# Patient Record
Sex: Female | Born: 1950 | Race: White | Hispanic: No | Marital: Married | Smoking: Never smoker
Health system: Southern US, Community
[De-identification: ages and names within clinical notes are randomized; demographics above are authoritative.]

## PROBLEM LIST (undated history)

## (undated) DIAGNOSIS — I509 Heart failure, unspecified: Secondary | ICD-10-CM

## (undated) DIAGNOSIS — E119 Type 2 diabetes mellitus without complications: Secondary | ICD-10-CM

## (undated) DIAGNOSIS — I429 Cardiomyopathy, unspecified: Secondary | ICD-10-CM

## (undated) DIAGNOSIS — I10 Essential (primary) hypertension: Secondary | ICD-10-CM

## (undated) DIAGNOSIS — M8580 Other specified disorders of bone density and structure, unspecified site: Secondary | ICD-10-CM

## (undated) DIAGNOSIS — L409 Psoriasis, unspecified: Secondary | ICD-10-CM

## (undated) DIAGNOSIS — M2550 Pain in unspecified joint: Secondary | ICD-10-CM

## (undated) DIAGNOSIS — Z889 Allergy status to unspecified drugs, medicaments and biological substances status: Secondary | ICD-10-CM

## (undated) DIAGNOSIS — Z9889 Other specified postprocedural states: Secondary | ICD-10-CM

## (undated) DIAGNOSIS — Z950 Presence of cardiac pacemaker: Secondary | ICD-10-CM

## (undated) DIAGNOSIS — I499 Cardiac arrhythmia, unspecified: Secondary | ICD-10-CM

## (undated) DIAGNOSIS — I4891 Unspecified atrial fibrillation: Secondary | ICD-10-CM

## (undated) DIAGNOSIS — M109 Gout, unspecified: Secondary | ICD-10-CM

## (undated) HISTORY — PX: BREAST EXCISIONAL BIOPSY: SUR124

## (undated) HISTORY — DX: Pain in unspecified joint: M25.50

## (undated) HISTORY — DX: Type 2 diabetes mellitus without complications: E11.9

## (undated) HISTORY — DX: Heart failure, unspecified: I50.9

## (undated) HISTORY — DX: Essential (primary) hypertension: I10

## (undated) HISTORY — DX: Gout, unspecified: M10.9

## (undated) HISTORY — DX: Other specified disorders of bone density and structure, unspecified site: M85.80

## (undated) HISTORY — DX: Unspecified atrial fibrillation: I48.91

## (undated) SURGICAL SUPPLY — 7 items
BIOPATCH RED 1 DISK 7.0 (GAUZE/BANDAGES/DRESSINGS) IMPLANT
CATH HEMO 15FR 19 SMART SEAL (HEMODIALYSIS SUPPLIES) IMPLANT
DRAPE INCISE IOBAN 66X45 STRL (DRAPES) IMPLANT
GOWN STRL REUS W/ TWL LRG LVL3 (GOWN DISPOSABLE) ×1 IMPLANT
PACK ANGIOGRAPHY (CUSTOM PROCEDURE TRAY) ×1 IMPLANT
SUT MNCRL AB 4-0 PS2 18 (SUTURE) IMPLANT
SUT SILK 0 FSL (SUTURE) IMPLANT

---

## 1979-03-25 HISTORY — PX: TUBAL LIGATION: SHX77

## 1983-03-25 HISTORY — PX: ABDOMINAL HYSTERECTOMY: SHX81

## 1984-03-24 HISTORY — PX: CHOLECYSTECTOMY: SHX55

## 1995-03-25 HISTORY — PX: BREAST SURGERY: SHX581

## 1998-03-14 ENCOUNTER — Ambulatory Visit (HOSPITAL_BASED_OUTPATIENT_CLINIC_OR_DEPARTMENT_OTHER): Admission: RE | Admit: 1998-03-14 | Discharge: 1998-03-14 | Payer: Self-pay | Admitting: General Surgery

## 2004-02-29 ENCOUNTER — Ambulatory Visit: Payer: Self-pay | Admitting: Internal Medicine

## 2005-04-04 ENCOUNTER — Ambulatory Visit: Payer: Self-pay | Admitting: Internal Medicine

## 2006-06-03 ENCOUNTER — Ambulatory Visit: Payer: Self-pay | Admitting: Internal Medicine

## 2007-06-30 ENCOUNTER — Ambulatory Visit: Payer: Self-pay | Admitting: Internal Medicine

## 2008-03-24 DIAGNOSIS — I4891 Unspecified atrial fibrillation: Secondary | ICD-10-CM

## 2008-03-24 HISTORY — DX: Unspecified atrial fibrillation: I48.91

## 2008-05-19 ENCOUNTER — Inpatient Hospital Stay: Payer: Self-pay | Admitting: Internal Medicine

## 2008-06-21 ENCOUNTER — Ambulatory Visit: Payer: Self-pay | Admitting: Cardiology

## 2008-08-25 ENCOUNTER — Ambulatory Visit: Payer: Self-pay | Admitting: Internal Medicine

## 2009-08-27 ENCOUNTER — Ambulatory Visit: Payer: Self-pay | Admitting: Internal Medicine

## 2010-12-24 ENCOUNTER — Ambulatory Visit: Payer: Self-pay | Admitting: Internal Medicine

## 2012-04-01 ENCOUNTER — Ambulatory Visit: Payer: Self-pay | Admitting: Internal Medicine

## 2013-09-22 ENCOUNTER — Ambulatory Visit: Payer: Self-pay | Admitting: Internal Medicine

## 2014-06-21 ENCOUNTER — Ambulatory Visit (INDEPENDENT_AMBULATORY_CARE_PROVIDER_SITE_OTHER): Payer: Medicare Other | Admitting: General Surgery

## 2014-06-21 ENCOUNTER — Encounter: Payer: Self-pay | Admitting: General Surgery

## 2014-06-21 VITALS — BP 156/96 | HR 96 | Resp 16 | Ht 62.0 in | Wt 183.0 lb

## 2014-06-21 DIAGNOSIS — R0789 Other chest pain: Secondary | ICD-10-CM

## 2014-06-21 DIAGNOSIS — Z1211 Encounter for screening for malignant neoplasm of colon: Secondary | ICD-10-CM | POA: Diagnosis not present

## 2014-06-21 MED ORDER — POLYETHYLENE GLYCOL 3350 17 GM/SCOOP PO POWD: ORAL | Status: DC

## 2014-06-21 NOTE — Progress Notes (Signed)
Patient ID: Deborah Dawson, female   DOB: [birth year], 64 y.o.   MRN: [id number]  Chief Complaint  Patient presents with  . Breast Problem    right breast pain    HPI Deborah Dawson is a 64 y.o. female.  who presents for a breast evaluation. The most recent mammogram was done on September 22 2013.  Patient does perform regular self breast checks and gets regular mammograms done.   Denies any trauma. She states she has had some "discomfort" in the right axillary area top part of the right breast. She states it has been constant for about 3-4 weeks. Family history of mom with breast cancer was around age 75. She has never had a colonoscopy and denies family history of colon cancer.  Bra size DD.  HPI  Past Medical History  Diagnosis Date  . Atrial fibrillation 2010  . Joint pain     Past Surgical History  Procedure Laterality Date  . Abdominal hysterectomy  1985  . Tubal ligation  1981  . Breast surgery Left 1997    lipoma  . Cholecystectomy  1986    Family History  Problem Relation Age of Onset  . Cancer Mother 92    breast  . Diabetes Mother     Social History History  Substance Use Topics  . Smoking status: Never Smoker   . Smokeless tobacco: Never Used  . Alcohol Use: No    Allergies  Allergen Reactions  . Augmentin [Amoxicillin-Pot Clavulanate] Diarrhea  . Red Dye Rash    Current Outpatient Prescriptions  Medication Sig Dispense Refill  . cloNIDine (CATAPRES) 0.2 MG tablet Take 0.2 mg by mouth 3 (three) times daily.    Marland Kitchen diltiazem (CARDIZEM CD) 180 MG 24 hr capsule Take 180 mg by mouth daily.    Marland Kitchen glimepiride (AMARYL) 2 MG tablet Take 2 mg by mouth at bedtime.    Marland Kitchen glimepiride (AMARYL) 4 MG tablet Take 4 mg by mouth daily with breakfast.    . indapamide (LOZOL) 1.25 MG tablet Take 1.25 mg by mouth daily.   3  . loratadine (CLARITIN) 10 MG tablet Take 10 mg by mouth daily as needed for allergies.    . metFORMIN (GLUCOPHAGE) 1000 MG tablet Take 1,000 mg by mouth 2  (two) times daily with a meal.   0  . warfarin (COUMADIN) 3 MG tablet Take 3 mg by mouth daily. Sat sun    . polyethylene glycol powder (GLYCOLAX/MIRALAX) powder 255 grams one bottle for colonoscopy prep 255 g 0  . warfarin (COUMADIN) 2.5 MG tablet Take 2.5 mg by mouth. mon through  fri  0   No current facility-administered medications for this visit.    Review of Systems Review of Systems  Blood pressure 156/96, pulse 96, resp. rate 16, height 5\' 2"  (1.575 m), weight 183 lb (83.008 kg).  Physical Exam Physical Exam  Constitutional: She is oriented to person, place, and time. She appears well-developed and well-nourished.  Neck: Neck supple.  Cardiovascular: Normal rate and normal heart sounds.  An irregular rhythm present.  Pulmonary/Chest: Effort normal and breath sounds normal. Right breast exhibits tenderness. Right breast exhibits no inverted nipple, no mass, no nipple discharge and no skin change. Left breast exhibits tenderness. Left breast exhibits no inverted nipple, no mass, no nipple discharge and no skin change.    tender around serratus right breast. Tenderness along inframammary fold left breast.  Lymphadenopathy:    She has no cervical adenopathy.  She has no axillary adenopathy.  Neurological: She is alert and oriented to person, place, and time.  Skin: Skin is warm and dry.    Data Reviewed Bilateral mammograms dated 09/22/2013 were reviewed. Near-complete fatty replacement of the breast. BI-RADS-1.  GYN notes from Kate Sable, M.D. dated 06/13/2014 reviewed.  Assessment    Benign breast exam.    Plan    Chest wall muscle pain, no evidence of breast pathology.  Candidate for screening colonoscopy.     May use heating pad as needed for comfort. May use Aleve twice a day for 5 days.  Colonoscopy with possible biopsy/polypectomy prn: Information regarding the procedure, including its potential risks and complications (including but not limited to  perforation of the bowel, which may require emergency surgery to repair, and bleeding) was verbally given to the patient. Educational information regarding lower instestinal endoscopy was given to the patient. Written instructions for how to complete the bowel prep using Miralax were provided. The importance of drinking ample fluids to avoid dehydration as a result of the prep emphasized.  Patient has been scheduled for a colonoscopy on 08-23-14 at [facility]. This patient has been asked to stop coumadin 4 days prior to procedure. She will also hold Metformin day of colonoscopy prep and procedure. Risks associated with discontinuation of her oral anticoagulation therapy in regards to CVA was reviewed. This is well less than 1%.     PCP:  Barbette Reichmann Ref: Dr Janann Colonel, Merrily Pew 06/23/2014, 12:13 PM

## 2014-06-21 NOTE — Patient Instructions (Addendum)
The patient is aware to call back for any questions or concerns. May use heating pad as needed for comfort.  May use Aleve twice a day for 5 days.  Colonoscopy A colonoscopy is an exam to look at the entire large intestine (colon). This exam can help find problems such as tumors, polyps, inflammation, and areas of bleeding. The exam takes about 1 hour.  LET [facility] CARE PROVIDER KNOW ABOUT:   Any allergies you have.  All medicines you are taking, including vitamins, herbs, eye drops, creams, and over-the-counter medicines.  Previous problems you or members of your family have had with the use of anesthetics.  Any blood disorders you have.  Previous surgeries you have had.  Medical conditions you have. RISKS AND COMPLICATIONS  Generally, this is a safe procedure. However, as with any procedure, complications can occur. Possible complications include:  Bleeding.  Tearing or rupture of the colon wall.  Reaction to medicines given during the exam.  Infection (rare). BEFORE THE PROCEDURE   Ask your health care provider about changing or stopping your regular medicines.  You may be prescribed an oral bowel prep. This involves drinking a large amount of medicated liquid, starting the day before your procedure. The liquid will cause you to have multiple loose stools until your stool is almost clear or light green. This cleans out your colon in preparation for the procedure.  Do not eat or drink anything else once you have started the bowel prep, unless your health care provider tells you it is safe to do so.  Arrange for someone to drive you home after the procedure. PROCEDURE   You will be given medicine to help you relax (sedative).  You will lie on your side with your knees bent.  A long, flexible tube with a light and camera on the end (colonoscope) will be inserted through the rectum and into the colon. The camera sends video back to a computer screen as it moves through  the colon. The colonoscope also releases carbon dioxide gas to inflate the colon. This helps your health care provider see the area better.  During the exam, your health care provider may take a small tissue sample (biopsy) to be examined under a microscope if any abnormalities are found.  The exam is finished when the entire colon has been viewed. AFTER THE PROCEDURE   Do not drive for 24 hours after the exam.  You may have a small amount of blood in your stool.  You may pass moderate amounts of gas and have mild abdominal cramping or bloating. This is caused by the gas used to inflate your colon during the exam.  Ask when your test results will be ready and how you will get your results. Make sure you get your test results. Document Released: 03/07/2000 Document Revised: 12/29/2012 Document Reviewed: 11/15/2012 [facility] Patient Information 2015 [place], [place]. This information is not intended to replace advice given to you by your health care provider. Make sure you discuss any questions you have with your health care provider.  Patient has been scheduled for a colonoscopy on 08-23-14 at [facility]. This patient has been asked to stop coumadin 4 days prior to procedure. She will also hold Metformin day of colonoscopy prep and procedure.

## 2014-06-23 ENCOUNTER — Other Ambulatory Visit: Payer: Self-pay | Admitting: General Surgery

## 2014-06-23 DIAGNOSIS — Z4901 Encounter for fitting and adjustment of extracorporeal dialysis catheter: Secondary | ICD-10-CM | POA: Insufficient documentation

## 2014-06-23 DIAGNOSIS — Z1211 Encounter for screening for malignant neoplasm of colon: Secondary | ICD-10-CM | POA: Insufficient documentation

## 2014-06-23 DIAGNOSIS — R0789 Other chest pain: Secondary | ICD-10-CM | POA: Insufficient documentation

## 2014-08-15 ENCOUNTER — Telehealth: Payer: Self-pay

## 2014-08-15 NOTE — Telephone Encounter (Signed)
Patient called and states that she does not want to have her Colonoscopy done at this time and did not want to reschedule it. I let her know that I would notify Dr Lemar Livings of her decision.

## 2014-08-23 ENCOUNTER — Ambulatory Visit: Admission: RE | Admit: 2014-08-23 | Payer: Self-pay | Source: Ambulatory Visit | Admitting: General Surgery

## 2014-08-23 ENCOUNTER — Encounter: Admission: RE | Payer: Self-pay | Source: Ambulatory Visit

## 2014-08-23 SURGERY — COLONOSCOPY
Anesthesia: General

## 2014-10-02 ENCOUNTER — Other Ambulatory Visit: Payer: Self-pay | Admitting: Internal Medicine

## 2014-10-02 DIAGNOSIS — Z1231 Encounter for screening mammogram for malignant neoplasm of breast: Secondary | ICD-10-CM

## 2014-10-03 ENCOUNTER — Ambulatory Visit
Admission: RE | Admit: 2014-10-03 | Discharge: 2014-10-03 | Disposition: A | Discharge status: Home / Self Care | Payer: Medicare Other | Source: Ambulatory Visit | Attending: Internal Medicine | Admitting: Internal Medicine

## 2014-10-03 ENCOUNTER — Other Ambulatory Visit: Payer: Self-pay | Admitting: Internal Medicine

## 2014-10-03 DIAGNOSIS — Z1231 Encounter for screening mammogram for malignant neoplasm of breast: Secondary | ICD-10-CM

## 2015-06-24 ENCOUNTER — Inpatient Hospital Stay
Admission: EM | Admit: 2015-06-24 | Discharge: 2015-06-26 | DRG: 291 | Disposition: A | Discharge status: Home / Self Care | Payer: Medicare Other | Attending: Internal Medicine | Admitting: Internal Medicine

## 2015-06-24 ENCOUNTER — Emergency Department: Payer: Medicare Other

## 2015-06-24 ENCOUNTER — Encounter: Payer: Self-pay | Admitting: Emergency Medicine

## 2015-06-24 DIAGNOSIS — I447 Left bundle-branch block, unspecified: Secondary | ICD-10-CM | POA: Diagnosis present

## 2015-06-24 DIAGNOSIS — J810 Acute pulmonary edema: Secondary | ICD-10-CM

## 2015-06-24 DIAGNOSIS — I428 Other cardiomyopathies: Secondary | ICD-10-CM | POA: Diagnosis present

## 2015-06-24 DIAGNOSIS — G8929 Other chronic pain: Secondary | ICD-10-CM | POA: Diagnosis present

## 2015-06-24 DIAGNOSIS — I110 Hypertensive heart disease with heart failure: Principal | ICD-10-CM | POA: Diagnosis present

## 2015-06-24 DIAGNOSIS — D72829 Elevated white blood cell count, unspecified: Secondary | ICD-10-CM | POA: Diagnosis present

## 2015-06-24 DIAGNOSIS — I501 Left ventricular failure: Secondary | ICD-10-CM | POA: Diagnosis present

## 2015-06-24 DIAGNOSIS — Z7984 Long term (current) use of oral hypoglycemic drugs: Secondary | ICD-10-CM | POA: Diagnosis not present

## 2015-06-24 DIAGNOSIS — J449 Chronic obstructive pulmonary disease, unspecified: Secondary | ICD-10-CM | POA: Diagnosis present

## 2015-06-24 DIAGNOSIS — E119 Type 2 diabetes mellitus without complications: Secondary | ICD-10-CM | POA: Diagnosis present

## 2015-06-24 DIAGNOSIS — R0603 Acute respiratory distress: Secondary | ICD-10-CM

## 2015-06-24 DIAGNOSIS — Z7901 Long term (current) use of anticoagulants: Secondary | ICD-10-CM

## 2015-06-24 DIAGNOSIS — M549 Dorsalgia, unspecified: Secondary | ICD-10-CM | POA: Diagnosis present

## 2015-06-24 DIAGNOSIS — J9601 Acute respiratory failure with hypoxia: Secondary | ICD-10-CM

## 2015-06-24 DIAGNOSIS — I4891 Unspecified atrial fibrillation: Secondary | ICD-10-CM

## 2015-06-24 DIAGNOSIS — I248 Other forms of acute ischemic heart disease: Secondary | ICD-10-CM | POA: Diagnosis present

## 2015-06-24 DIAGNOSIS — I5021 Acute systolic (congestive) heart failure: Secondary | ICD-10-CM | POA: Diagnosis present

## 2015-06-24 DIAGNOSIS — R0600 Dyspnea, unspecified: Secondary | ICD-10-CM | POA: Diagnosis present

## 2015-06-24 DIAGNOSIS — Z833 Family history of diabetes mellitus: Secondary | ICD-10-CM | POA: Diagnosis not present

## 2015-06-24 DIAGNOSIS — R0902 Hypoxemia: Secondary | ICD-10-CM

## 2015-06-24 DIAGNOSIS — I10 Essential (primary) hypertension: Secondary | ICD-10-CM

## 2015-06-24 DIAGNOSIS — Z803 Family history of malignant neoplasm of breast: Secondary | ICD-10-CM | POA: Diagnosis not present

## 2015-06-24 DIAGNOSIS — I482 Chronic atrial fibrillation: Secondary | ICD-10-CM | POA: Diagnosis present

## 2015-06-24 DIAGNOSIS — I509 Heart failure, unspecified: Secondary | ICD-10-CM

## 2015-06-24 DIAGNOSIS — R000 Tachycardia, unspecified: Secondary | ICD-10-CM | POA: Diagnosis present

## 2015-06-24 DIAGNOSIS — R079 Chest pain, unspecified: Secondary | ICD-10-CM

## 2015-06-24 LAB — COMPREHENSIVE METABOLIC PANEL
ALT: 16 U/L (ref 14–54)
AST: 26 U/L (ref 15–41)
Albumin: 4.2 g/dL (ref 3.5–5.0)
Alkaline Phosphatase: 50 U/L (ref 38–126)
Anion gap: 12 (ref 5–15)
BUN: 13 mg/dL (ref 6–20)
CO2: 19 mmol/L — ABNORMAL LOW (ref 22–32)
Calcium: 9.4 mg/dL (ref 8.9–10.3)
Chloride: 105 mmol/L (ref 101–111)
Creatinine, Ser: 0.74 mg/dL (ref 0.44–1.00)
GFR calc Af Amer: >60 mL/min (ref >60)
GFR calc non Af Amer: >60 mL/min (ref >60)
Glucose, Bld: 313 mg/dL — ABNORMAL HIGH (ref 65–99)
Potassium: 4.1 mmol/L (ref 3.5–5.1)
Sodium: 136 mmol/L (ref 135–145)
Total Bilirubin: 0.7 mg/dL (ref 0.3–1.2)
Total Protein: 8.1 g/dL (ref 6.5–8.1)

## 2015-06-24 LAB — CBC WITH DIFFERENTIAL/PLATELET
Basophils Absolute: 0.1 10*3/uL (ref 0–0.1)
Basophils Relative: 1 %
Eosinophils Absolute: 0.3 10*3/uL (ref 0–0.7)
Eosinophils Relative: 2 %
HCT: 42.6 % (ref 35.0–47.0)
Hemoglobin: 13.8 g/dL (ref 12.0–16.0)
Lymphocytes Relative: 29 %
Lymphs Abs: 3.9 10*3/uL — ABNORMAL HIGH (ref 1.0–3.6)
MCH: 27.5 pg (ref 26.0–34.0)
MCHC: 32.3 g/dL (ref 32.0–36.0)
MCV: 85 fL (ref 80.0–100.0)
Monocytes Absolute: 1 10*3/uL — ABNORMAL HIGH (ref 0.2–0.9)
Monocytes Relative: 8 %
Neutro Abs: 8.2 10*3/uL — ABNORMAL HIGH (ref 1.4–6.5)
Neutrophils Relative %: 60 %
Platelets: 363 10*3/uL (ref 150–440)
RBC: 5.01 MIL/uL (ref 3.80–5.20)
RDW: 15 % — ABNORMAL HIGH (ref 11.5–14.5)
WBC: 13.5 10*3/uL — ABNORMAL HIGH (ref 3.6–11.0)

## 2015-06-24 LAB — TSH: TSH: 0.65 u[IU]/mL (ref 0.350–4.500)

## 2015-06-24 LAB — TROPONIN I
Troponin I: <0.03 ng/mL (ref <0.031)
Troponin I: 0.38 ng/mL — ABNORMAL HIGH (ref <0.031)
Troponin I: 0.48 ng/mL — ABNORMAL HIGH (ref <0.031)

## 2015-06-24 LAB — PROTIME-INR
INR: 2.15
Prothrombin Time: 23.8 seconds — ABNORMAL HIGH (ref 11.4–15.0)

## 2015-06-24 LAB — BRAIN NATRIURETIC PEPTIDE: B Natriuretic Peptide: 922 pg/mL — ABNORMAL HIGH (ref 0.0–100.0)

## 2015-06-24 LAB — MAGNESIUM: Magnesium: 1.4 mg/dL — ABNORMAL LOW (ref 1.7–2.4)

## 2015-06-24 MED ORDER — ONDANSETRON HCL 4 MG PO TABS: 4.0000 mg | ORAL_TABLET | Freq: Four times a day (QID) | ORAL | Status: DC | PRN

## 2015-06-24 MED ORDER — ASPIRIN EC 81 MG PO TBEC
81.0000 mg | DELAYED_RELEASE_TABLET | Freq: Every day | ORAL | Status: DC
  Administered 2015-06-24 – 2015-06-26 (×3): 81 mg via ORAL
  Filled 2015-06-24 (×3): qty 1

## 2015-06-24 MED ORDER — METFORMIN HCL 500 MG PO TABS
1000.0000 mg | ORAL_TABLET | Freq: Two times a day (BID) | ORAL | Status: DC
  Administered 2015-06-24 – 2015-06-26 (×4): 1000 mg via ORAL
  Filled 2015-06-24 (×4): qty 2

## 2015-06-24 MED ORDER — INSULIN ASPART 100 UNIT/ML ~~LOC~~ SOLN: 0.0000 [IU] | Freq: Every day | SUBCUTANEOUS | Status: DC

## 2015-06-24 MED ORDER — FUROSEMIDE 10 MG/ML IJ SOLN
20.0000 mg | Freq: Two times a day (BID) | INTRAMUSCULAR | Status: DC
  Administered 2015-06-24 – 2015-06-26 (×4): 20 mg via INTRAVENOUS
  Filled 2015-06-24 (×4): qty 2

## 2015-06-24 MED ORDER — DILTIAZEM HCL 25 MG/5ML IV SOLN
10.0000 mg | Freq: Once | INTRAVENOUS | Status: AC
  Administered 2015-06-24: 10 mg via INTRAVENOUS

## 2015-06-24 MED ORDER — ACETAMINOPHEN 650 MG RE SUPP: 650.0000 mg | Freq: Four times a day (QID) | RECTAL | Status: DC | PRN

## 2015-06-24 MED ORDER — INSULIN ASPART 100 UNIT/ML ~~LOC~~ SOLN
4.0000 [IU] | Freq: Three times a day (TID) | SUBCUTANEOUS | Status: DC
  Administered 2015-06-25 – 2015-06-26 (×4): 4 [IU] via SUBCUTANEOUS
  Filled 2015-06-24 (×5): qty 4

## 2015-06-24 MED ORDER — CLONIDINE HCL 0.1 MG PO TABS
0.2000 mg | ORAL_TABLET | Freq: Three times a day (TID) | ORAL | Status: DC
  Administered 2015-06-24 – 2015-06-26 (×5): 0.2 mg via ORAL
  Filled 2015-06-24 (×5): qty 2

## 2015-06-24 MED ORDER — SODIUM CHLORIDE 0.9% FLUSH
3.0000 mL | Freq: Two times a day (BID) | INTRAVENOUS | Status: DC
  Administered 2015-06-24 – 2015-06-26 (×4): 3 mL via INTRAVENOUS

## 2015-06-24 MED ORDER — NITROGLYCERIN 0.4 MG SL SUBL
SUBLINGUAL_TABLET | SUBLINGUAL | Status: AC
  Administered 2015-06-24: 0.4 mg via SUBLINGUAL
  Filled 2015-06-24: qty 1

## 2015-06-24 MED ORDER — NITROGLYCERIN 2 % TD OINT
1.0000 [in_us] | TOPICAL_OINTMENT | Freq: Four times a day (QID) | TRANSDERMAL | Status: DC
  Administered 2015-06-25 (×2): 1 [in_us] via TOPICAL
  Filled 2015-06-24 (×2): qty 1

## 2015-06-24 MED ORDER — GLIMEPIRIDE 2 MG PO TABS
4.0000 mg | ORAL_TABLET | ORAL | Status: DC
  Administered 2015-06-25 – 2015-06-26 (×2): 4 mg via ORAL
  Filled 2015-06-24 (×3): qty 2

## 2015-06-24 MED ORDER — DILTIAZEM HCL 25 MG/5ML IV SOLN
15.0000 mg | Freq: Once | INTRAVENOUS | Status: AC
  Administered 2015-06-24: 15 mg via INTRAVENOUS

## 2015-06-24 MED ORDER — DILTIAZEM HCL 100 MG IV SOLR
5.0000 mg/h | Freq: Once | INTRAVENOUS | Status: AC
  Administered 2015-06-24: 10 mg/h via INTRAVENOUS
  Filled 2015-06-24 (×2): qty 100

## 2015-06-24 MED ORDER — MAGNESIUM OXIDE 400 (241.3 MG) MG PO TABS
400.0000 mg | ORAL_TABLET | Freq: Two times a day (BID) | ORAL | Status: AC
  Administered 2015-06-24 – 2015-06-25 (×2): 400 mg via ORAL
  Filled 2015-06-24 (×2): qty 1

## 2015-06-24 MED ORDER — OXYCODONE HCL 5 MG PO TABS: 5.0000 mg | ORAL_TABLET | ORAL | Status: DC | PRN

## 2015-06-24 MED ORDER — DILTIAZEM HCL 100 MG IV SOLR
5.0000 mg/h | INTRAVENOUS | Status: DC
  Administered 2015-06-24: 15 mg/h via INTRAVENOUS

## 2015-06-24 MED ORDER — NITROGLYCERIN 0.4 MG SL SUBL
0.4000 mg | SUBLINGUAL_TABLET | SUBLINGUAL | Status: DC | PRN
  Administered 2015-06-24: 0.4 mg via SUBLINGUAL

## 2015-06-24 MED ORDER — ONDANSETRON HCL 4 MG/2ML IJ SOLN: 4.0000 mg | Freq: Four times a day (QID) | INTRAMUSCULAR | Status: DC | PRN

## 2015-06-24 MED ORDER — DILTIAZEM HCL ER COATED BEADS 180 MG PO CP24
180.0000 mg | ORAL_CAPSULE | Freq: Two times a day (BID) | ORAL | Status: DC
  Administered 2015-06-24 – 2015-06-25 (×2): 180 mg via ORAL
  Filled 2015-06-24 (×2): qty 1

## 2015-06-24 MED ORDER — FUROSEMIDE 10 MG/ML IJ SOLN
20.0000 mg | Freq: Once | INTRAMUSCULAR | Status: AC
  Administered 2015-06-24: 20 mg via INTRAVENOUS
  Filled 2015-06-24: qty 4

## 2015-06-24 MED ORDER — DILTIAZEM HCL 25 MG/5ML IV SOLN
INTRAVENOUS | Status: AC
  Administered 2015-06-24: 25 mg
  Filled 2015-06-24: qty 5

## 2015-06-24 MED ORDER — WARFARIN SODIUM 2.5 MG PO TABS
2.5000 mg | ORAL_TABLET | ORAL | Status: DC
  Administered 2015-06-25: 2.5 mg via ORAL
  Filled 2015-06-24 (×2): qty 1

## 2015-06-24 MED ORDER — INSULIN ASPART 100 UNIT/ML ~~LOC~~ SOLN
0.0000 [IU] | Freq: Three times a day (TID) | SUBCUTANEOUS | Status: DC
  Administered 2015-06-25 – 2015-06-26 (×3): 2 [IU] via SUBCUTANEOUS
  Filled 2015-06-24: qty 2
  Filled 2015-06-24: qty 3
  Filled 2015-06-24 (×2): qty 2

## 2015-06-24 MED ORDER — MAGNESIUM SULFATE 2 GM/50ML IV SOLN
2.0000 g | Freq: Once | INTRAVENOUS | Status: AC
  Administered 2015-06-24: 2 g via INTRAVENOUS
  Filled 2015-06-24: qty 50

## 2015-06-24 MED ORDER — GLIMEPIRIDE 2 MG PO TABS
2.0000 mg | ORAL_TABLET | Freq: Every evening | ORAL | Status: DC
  Administered 2015-06-24 – 2015-06-25 (×2): 2 mg via ORAL
  Filled 2015-06-24 (×2): qty 1

## 2015-06-24 MED ORDER — WARFARIN SODIUM 3 MG PO TABS
3.0000 mg | ORAL_TABLET | ORAL | Status: DC
  Administered 2015-06-24: 3 mg via ORAL
  Filled 2015-06-24: qty 1

## 2015-06-24 MED ORDER — ACETAMINOPHEN 325 MG PO TABS: 650.0000 mg | ORAL_TABLET | Freq: Four times a day (QID) | ORAL | Status: DC | PRN

## 2015-06-24 MED ORDER — SODIUM CHLORIDE 0.9% FLUSH: 3.0000 mL | INTRAVENOUS | Status: DC | PRN

## 2015-06-24 MED ORDER — WARFARIN SODIUM 2 MG PO TABS: 3.0000 mg | ORAL_TABLET | Freq: Every evening | ORAL | Status: DC

## 2015-06-24 MED ORDER — IPRATROPIUM-ALBUTEROL 0.5-2.5 (3) MG/3ML IN SOLN
RESPIRATORY_TRACT | Status: AC
  Filled 2015-06-24: qty 6

## 2015-06-24 MED ORDER — WARFARIN SODIUM 5 MG PO TABS: 2.5000 mg | ORAL_TABLET | Freq: Every evening | ORAL | Status: DC

## 2015-06-24 MED ORDER — SODIUM CHLORIDE 0.9 % IV SOLN: 250.0000 mL | INTRAVENOUS | Status: DC | PRN

## 2015-06-24 NOTE — Progress Notes (Addendum)
Paged Dr. Winona Legato about cardizem order since patient came up from the ER with it currently infusing. MD stated she thought it was stopped int he emergency room. Patient's heart rate has been from the 80's to high 90's in Afib since arriving to the floor with cardizem going at 15. MD stated to titrate drip down, starting at 10 now. MD stated to try and get the patient off the drip tonight so she can take her PO dose of 180 cardizem tonight at 2000. Will titrate down to 10 and continue to monitor.

## 2015-06-24 NOTE — H&P (Signed)
Spoke with Dr. Winona Legato about patient current HR and BP. Dr. Denzil Magnuson stated to proceed with the diltiazem 180 mg, and D/C the cardizem drip in a hr. Will continue to monitor.

## 2015-06-24 NOTE — ED Notes (Signed)
Pt taken off BiPAP and placed on 3L. Tolerating well.

## 2015-06-24 NOTE — Progress Notes (Signed)
Patient's cardizem drip D/C per Dr. Winona Legato.

## 2015-06-24 NOTE — Progress Notes (Signed)
Notified prime doc, Sparks, that patient's second troponin came back at 0.38. First one was negative. Patient does not currently have a cardiology consult, MD will order. Patient's magnesium also came back low at 1.4. MD to place orders. Will continue to monitor.

## 2015-06-24 NOTE — ED Provider Notes (Signed)
[facility] Emergency Department Provider Note   ____________________________________________  Time seen: Immediately upon ED arrival I have reviewed the triage vital signs and the triage nursing note.  HISTORY  Chief Complaint Respiratory Distress   Historian Limited, patient in severe respiratory distress Significant other provide some history, but relatively poor historian  HPI Deborah Dawson is a 65 y.o. female arrived in respiratory distress and brought immediately back to a room. Patient was reportedly hypoxic into the mid 80s on room air and placed on 4 L nasal cannula up to the mid 90s.  Patient having trouble speaking. She initially stated that she has not had a heart attack, congestive heart failure, or any lung problems such as COPD or emphysema.  Unclear whether or not she has been having respiratory symptoms recently. Shakes her head no when asked about fevers.  Once hooked up to the monitor, monitor shows wide complex irregular and tachycardic rhythm. Patient reports that she takes Cardizem at home for atrial fibrillation.    Past Medical History  Diagnosis Date  . Atrial fibrillation (HCC) 2010  . Joint pain     Patient Active Problem List   Diagnosis Date Noted  . Encounter for screening colonoscopy 06/23/2014  . Chest wall pain 06/23/2014    Past Surgical History  Procedure Laterality Date  . Abdominal hysterectomy  1985  . Tubal ligation  1981  . Breast surgery Left 1997    lipoma  . Cholecystectomy  1986  . Breast excisional biopsy Left     negative years ago    Current Outpatient Rx  Name  Route  Sig  Dispense  Refill  . cloNIDine (CATAPRES) 0.2 MG tablet   Oral   Take 0.2 mg by mouth 3 (three) times daily.         Marland Kitchen diltiazem (CARDIZEM CD) 180 MG 24 hr capsule   Oral   Take 180 mg by mouth daily.         Marland Kitchen glimepiride (AMARYL) 2 MG tablet   Oral   Take 2 mg by mouth at bedtime.         Marland Kitchen glimepiride  (AMARYL) 4 MG tablet   Oral   Take 4 mg by mouth daily with breakfast.         . indapamide (LOZOL) 1.25 MG tablet   Oral   Take 1.25 mg by mouth daily.       3   . loratadine (CLARITIN) 10 MG tablet   Oral   Take 10 mg by mouth daily as needed for allergies.         . metFORMIN (GLUCOPHAGE) 1000 MG tablet   Oral   Take 1,000 mg by mouth 2 (two) times daily with a meal.       0   . polyethylene glycol powder (GLYCOLAX/MIRALAX) powder      255 grams one bottle for colonoscopy prep   255 g   0   . warfarin (COUMADIN) 2.5 MG tablet   Oral   Take 2.5 mg by mouth. mon through  fri      0   . warfarin (COUMADIN) 3 MG tablet   Oral   Take 3 mg by mouth daily. Sat sun           Allergies Augmentin and Red dye  Family History  Problem Relation Age of Onset  . Cancer Mother 98    breast  . Diabetes Mother   . Breast cancer Mother 89  Social History Social History  Substance Use Topics  . Smoking status: Never Smoker   . Smokeless tobacco: Never Used  . Alcohol Use: No    Review of Systems Limited due to respiratory distress Constitutional: Negative for fever. Eyes:  ENT:  Cardiovascular: Negative for chest pain. Respiratory: Positive for severe shortness of breath. Gastrointestinal: Genitourinary:  Musculoskeletal:  Skin:  Neurological:   ____________________________________________   PHYSICAL EXAM:  VITAL SIGNS: ED Triage Vitals  Enc Vitals Group     BP 06/24/15 1003 165/110 mmHg     Pulse Rate 06/24/15 1003 180     Resp 06/24/15 1003 40     Temp 06/24/15 1003 97.7 F (36.5 C)     Temp Source 06/24/15 1003 Oral     SpO2 06/24/15 1001 82 %     Weight 06/24/15 1018 187 lb (84.823 kg)     Height --      Head Cir --      Peak Flow --      Pain Score 06/24/15 1001 10     Pain Loc --      Pain Edu? --      Excl. in GC? --      Constitutional: Alert but anxious and in respiratory distress HEENT   Head: Normocephalic and  atraumatic.  Sweating      Eyes: Conjunctivae are normal. PERRL. Normal extraocular movements.      Ears:         Nose: No congestion/rhinnorhea.   Mouth/Throat: Mucous membranes are moist.   Neck: No stridor. Cardiovascular/Chest: Tachycardic and irregularly irregular.  No murmurs, rubs, or gallops. Respiratory: Tachypnea,Retractions. Can only speak about 1 word at a time. wet sounds throughout all fields, some wheezing, mild decreased breath sounds at bases. Gastrointestinal: Soft. No distention, no guarding, no rebound. Nontender.  Genitourinary/rectal:Deferred Musculoskeletal: Nontender with normal range of motion in all extremities. No joint effusions.  No lower extremity tenderness.  No edema. Neurologic:  Normal speech and language. No gross or focal neurologic deficits are appreciated. Skin:  Skin is warm, dry and intact. No rash noted. Psychiatric: Mood and affect are normal. Speech and behavior are normal. Patient exhibits appropriate insight and judgment.  ____________________________________________   EKG I, Governor Rooks, MD, the attending physician have personally viewed and interpreted all ECGs.  EKG #1. Heart rate 177 bpm. Irregularly irregular wide complex tachycardia/left bundle branch block. Nonspecific ST-T wave. Undetermined axis  EKG #2 heart rate 115 bpm. Irregularly irregular with a rhythm consistent with atrial fibrillation and apparent conduction/left bundle branch block. Normal axis. Nonspecific T-wave ____________________________________________  LABS (pertinent positives/negatives)  Metabolic panel significant for CO2 19 otherwise without significant abnormalities Troponin less than 0.03 White blood count 10.5, hemoglobin is 13.8 and platelet count 363 BNP 922 INR 2.15  ____________________________________________  RADIOLOGY All Xrays were viewed by me. Imaging interpreted by Radiologist.  Chest x-ray one view portable: Enlargement of the  cardiac silhouette. Diffuse pulmonary infiltrates and central peribronchial thickening favoring pulmonary edema. __________________________________________  PROCEDURES  Procedure(s) performed: None  Critical Care performed: CRITICAL CARE Performed by: Governor Rooks   Total critical care time: 60 minutes  Critical care time was exclusive of separately billable procedures and treating other patients.  Critical care was necessary to treat or prevent imminent or life-threatening deterioration.  Critical care was time spent personally by me on the following activities: development of treatment plan with patient and/or surrogate as well as nursing, discussions with consultants, evaluation of patient's response to treatment,  examination of patient, obtaining history from patient or surrogate, ordering and performing treatments and interventions, ordering and review of laboratory studies, ordering and review of radiographic studies, pulse oximetry and re-evaluation of patient's condition.   ____________________________________________   ED COURSE / ASSESSMENT AND PLAN  Pertinent labs & imaging results that were available during my care of the patient were reviewed by me and considered in my medical decision making (see chart for details).   Patient arrived in severe respiratory distress, and noted to be hypoxic. Clinical impression COPD exacerbation versus flash pulmonary edema. Patient reports no history of lung issues, and a history of atrial fibrillation, and so I felt CHF acute exacerbation with possible edema most likely.  Heart rate irregularly irregular and wide complex. Given the history of patient being on Cardizem and warfarin for prior A. fib, this is most likely to be A. fib with aberrancy.  Patient was placed on cardiac monitor as well as cardiac pads.  No prior EKG for comparison, and there is some consideration that potential new onset left and a branch block could be STEMI  equivalent. However patient is not having any chest pain.  I discussed this case with patient's cardiologist Dr. Darrold Junker, he reviewed the prior notes that his last office visit was 2 years ago, but the patient had a history of atrial fibrillation and on Cardizem and warfarin. He did recommend going ahead with Cardizem rather than something for wide complex tachycardia such as amiodarone.  50 mg Cardizem bolus IV was given and resultant heart rate came down to 90-110, and repeat EKG most consistent with atrial fibrillation with left bundle branch block.  Patient initially complaining of all respiratory symptoms, and having trouble tolerating nonrebreather. She is placed on nonrebreather for preoxygenation. I didn't want to try her on BiPAP given the fact that my impression is acute pulmonary edema.  After her heart rate was somewhat under control, she was able tolerate going on BiPAP. Given her blood pressure was still significantly elevated, patient was given nitroglycerin as well as Lasix for acute CHF exacerbation.  Heart rate did drifts back into the 120s to 130s and she was given a second IV Cardizem bolus of 10 mg. She was also ordered to start a Cardizem drip.   ----------------------------------------- 2:03 PM on 06/24/2015 -----------------------------------------  Chest x-ray and labs are consistent with pulmonary edema. Patient responded well to BiPAP Nitrostat and Lasix. She was able to be taken off of BiPAP around 2 PM and stable on 3 L nasal cannula. Blood pressure and heart rate much improved. At this point she does not need ICU admission, but I spoke with hospitalist for floor admission.    CONSULTATIONS:  Hospitalist physician for admission   Patient / Family / Caregiver informed of clinical course, medical decision-making process, and agree with plan.     ___________________________________________   FINAL CLINICAL IMPRESSION(S) / ED DIAGNOSES   Final diagnoses:   Flash pulmonary edema (HCC)  Atrial fibrillation with rapid ventricular response (HCC)  Hypoxia  Respiratory distress              Note: This dictation was prepared with Dragon dictation. Any transcriptional errors that result from this process are unintentional   Governor Rooks, MD 06/24/15 1404

## 2015-06-24 NOTE — Progress Notes (Signed)
Patient arrived to 2A. Alert and oriented. Vitals are stable. Cardizem currently infusing at 36mL/hr. Bag is almost empty, pharmacy notified immediately for a replacement. Still complaining of some chest tightness, but states it is much better than it was earlier. Currently on 3L of oxygen, saturation at 99%. Telemetry verified, on box 40-09. Will continue to monitor.

## 2015-06-24 NOTE — H&P (Addendum)
[facility] - [place] at [facility]   PATIENT NAME: Deborah Dawson    MR#:  [id number]  DATE OF BIRTH:  Sep 05, 1950  DATE OF ADMISSION:  06/24/2015  PRIMARY CARE PHYSICIAN: Barbette Reichmann, MD   REQUESTING/REFERRING PHYSICIAN:   CHIEF COMPLAINT:   Chief Complaint  Patient presents with  . Respiratory Distress    HISTORY OF PRESENT ILLNESS: Deborah Dawson  is a 65 y.o. female with a known history of Multiple medical problems including chronic atrial fibrillation, on Coumadin therapy, hypertension, diabetes mellitus, who presents to the hospital with complaints of severe shortness of breath started last night . On arrival to emergency room, patient was hypoxic with O2 sats of 82% on room air, requiring BiPAP administration, she was noted to have wide-complex tachycardia with a rate of 180, she was given Cardizem intravenously due to suspicion that wide-complex tachycardia was atrial fibrillation with aberrancy. After slowing patient's heart rate down. Atrial fibrillation was noted. Chest x-ray revealed diffuse pulmonary infiltrates with central peribronchial thickening favoring pulmonary edema. Patient was given Lasix intravenously as well as nitroglycerin sublingually and diuresed. She was taken off BiPAP, now she is on oxygen through nasal cannula since. Feels much more comfortable. Hospitalist services were contacted for admission. Patient also complains of chest pains, which happened prior to coming to the emergency room. She described this pain as tightness in the sternal area, intermittent pain with increasing whenever she exerted herself. She also admits of back pain, chronic in mid back. Patient's troponin was negative on arrival to the hospital.    PAST MEDICAL HISTORY:   Past Medical History  Diagnosis Date  . Atrial fibrillation (HCC) 2010  . Joint pain     PAST SURGICAL HISTORY: Past Surgical History  Procedure Laterality Date  . Abdominal hysterectomy  1985   . Tubal ligation  1981  . Breast surgery Left 1997    lipoma  . Cholecystectomy  1986  . Breast excisional biopsy Left     negative years ago    SOCIAL HISTORY:  Social History  Substance Use Topics  . Smoking status: Never Smoker   . Smokeless tobacco: Never Used  . Alcohol Use: No    FAMILY HISTORY:  Family History  Problem Relation Age of Onset  . Cancer Mother 68    breast  . Diabetes Mother   . Breast cancer Mother 47    DRUG ALLERGIES:  Allergies  Allergen Reactions  . Gluten Meal Hives and Rash  . Augmentin [Amoxicillin-Pot Clavulanate] Diarrhea  . Red Dye Rash    Review of Systems  Constitutional: Positive for malaise/fatigue. Negative for fever, chills and weight loss.  HENT: Negative for congestion.   Eyes: Negative for blurred vision and double vision.  Respiratory: Positive for cough, sputum production and shortness of breath. Negative for wheezing.   Cardiovascular: Positive for palpitations, orthopnea and leg swelling. Negative for chest pain and PND.  Gastrointestinal: Negative for nausea, vomiting, abdominal pain, diarrhea, constipation, blood in stool and melena.  Genitourinary: Negative for dysuria, urgency, frequency and hematuria.  Musculoskeletal: Positive for back pain. Negative for falls.  Skin: Negative for rash.  Neurological: Positive for weakness. Negative for dizziness.  Psychiatric/Behavioral: Negative for depression and memory loss. The patient is not nervous/anxious.     MEDICATIONS AT HOME:  Prior to Admission medications   Medication Sig Start Date End Date Taking? Authorizing Provider  cloNIDine (CATAPRES) 0.2 MG tablet Take 0.2 mg by mouth 3 (three) times daily.   Yes  Historical Provider, MD  diltiazem (CARDIZEM CD) 180 MG 24 hr capsule Take 180 mg by mouth every morning.    Yes Historical Provider, MD  glimepiride (AMARYL) 2 MG tablet Take 2 mg by mouth every evening. *note dose*   Yes Historical Provider, MD  glimepiride  (AMARYL) 4 MG tablet Take 4 mg by mouth every morning. *note dose*   Yes Historical Provider, MD  indapamide (LOZOL) 1.25 MG tablet Take 1.25 mg by mouth every morning.  04/19/14  Yes Historical Provider, MD  metFORMIN (GLUCOPHAGE) 1000 MG tablet Take 1,000 mg by mouth 2 (two) times daily with a meal.  05/21/14  Yes Historical Provider, MD  polyethylene glycol powder (GLYCOLAX/MIRALAX) powder 255 grams one bottle for colonoscopy prep 06/21/14  Yes Seeplaputhur Wynona Luna, MD  warfarin (COUMADIN) 2.5 MG tablet Take 2.5 mg by mouth every evening. Take Monday through Friday only. *note dose* 04/19/14  Yes Historical Provider, MD  warfarin (COUMADIN) 3 MG tablet Take 3 mg by mouth every evening. Take on Saturday and Sunday only. *note dose*   Yes Historical Provider, MD      PHYSICAL EXAMINATION:   VITAL SIGNS: Blood pressure 160/99, pulse 96, temperature 97.7 F (36.5 C), temperature source Oral, resp. rate 20, weight 84.823 kg (187 lb), SpO2 98 %.  GENERAL:  64 y.o.-year-old patient lying in the bed In mild to moderate respiratory distress. Tachypneic whenever talks long sentences EYES: Pupils equal, round, reactive to light and accommodation. No scleral icterus. Extraocular muscles intact.  HEENT: Head atraumatic, normocephalic. Oropharynx and nasopharynx clear.  NECK:  Supple, no jugular venous distention. No thyroid enlargement, no tenderness.  LUNGS: Decreased breath sounds bilaterally, no wheezing, but bilateral rales,rhonchi and crepitations noted. No use of accessory muscles of respiration.  CARDIOVASCULAR: S1, S2, irregularly irregular. No murmurs, rubs, or gallops, distant sounds.  ABDOMEN: Soft, nontender, nondistended. Bowel sounds present. No organomegaly or mass.  EXTREMITIES: Trace pedal edema, no cyanosis, or clubbing.  NEUROLOGIC: Cranial nerves II through XII are intact. Muscle strength 5/5 in all extremities. Sensation intact. Gait not checked.  PSYCHIATRIC: The patient is alert and  oriented x 3.  SKIN: No obvious rash, lesion, or ulcer.   LABORATORY PANEL:   CBC  Recent Labs Lab 06/24/15 1009  WBC 13.5*  HGB 13.8  HCT 42.6  PLT 363  MCV 85.0  MCH 27.5  MCHC 32.3  RDW 15.0*  LYMPHSABS 3.9*  MONOABS 1.0*  EOSABS 0.3  BASOSABS 0.1   ------------------------------------------------------------------------------------------------------------------  Chemistries   Recent Labs Lab 06/24/15 1009  NA 136  K 4.1  CL 105  CO2 19*  GLUCOSE 313*  BUN 13  CREATININE 0.74  CALCIUM 9.4  AST 26  ALT 16  ALKPHOS 50  BILITOT 0.7   ------------------------------------------------------------------------------------------------------------------  Cardiac Enzymes  Recent Labs Lab 06/24/15 1009  TROPONINI <0.03   ------------------------------------------------------------------------------------------------------------------  RADIOLOGY: Dg Chest Port 1 View  06/24/2015  CLINICAL DATA:  Atrial fibrillation, noncardiac issues, extreme shortness of breath, gasping, unable to speak in full sentences EXAM: PORTABLE CHEST 1 VIEW COMPARISON:  Portable exam 1009 hours without priors for comparison FINDINGS: Enlargement of cardiac silhouette with vascular congestion. Mediastinal contours normal. Peribronchial thickening with diffuse interstitial infiltrates bilaterally favoring pulmonary edema. No pleural effusion or pneumothorax. Bones demineralized. IMPRESSION: Enlargement of cardiac silhouette. Diffuse pulmonary infiltrates and central peribronchial thickening favoring pulmonary edema. Electronically Signed   By: Ulyses Southward M.D.   On: 06/24/2015 10:41    EKG: Orders placed or performed during the hospital  encounter of 06/24/15  . EKG 12-Lead  . EKG 12-Lead  Initial EKG second of April 2017 revealed wide-complex tachycardia at a rate of 1 77 bpm, repeated EKG revealed atrial fibrillation at rate 115 bpm, normal axis, left bundle branch block, nonspecific ST-T  changes.   IMPRESSION AND PLAN:  Active Problems:   Acute pulmonary edema (HCC)   Acute respiratory failure with hypoxia (HCC)   Malignant essential hypertension   Chest pain   Atrial fibrillation with RVR (HCC)   Leukocytosis #1. Acute pulmonary edema, likely due to acute CHF, admitted patient medical floor, continue her on Lasix and nitroglycerin, watching in's and outs and patient's weight, get echocardiogram done, get cardiologist involved #2. Acute respiratory failure with hypoxia of 82% on room air on arrival to emergency room, requiring BiPAP administration, now diuresed and off BiPAP, continue oxygen therapy as needed, keeping a pulse oximeter is around 94% and above, wean off as able. #3. Malignant essential hypertension, continue outpatient medications, adding nitroglycerin topically, advanced medications as needed.  #4. Chest pain, possibly related to work or breathing, however, cannot rule out A. fib, RVR/demand ischemia related, continue Cardizem, aspirin, nitroglycerin, follow cardiac enzymes 3, get echocardiogram and cardiologist involved #5. A. fib, RVR, continue Cardizem,  Advance it to twice a day, following heart rate and blood pressure readings, continue Coumadin, following [employer] INR #6. Leukocytosis, unclear etiology, may be related to stress, however, cannot rule out acute bronchitis, since patient expectorates yellow phlegm, get sputum culture, follow white blood cell count in the morning. No antibiotics  All the records are reviewed and case discussed with ED provider. Management plans discussed with the patient, family and they are in agreement.  CODE STATUS: Code Status History    This patient does not have a recorded code status. Please follow your organizational policy for patients in this situation.       TOTAL TIME TAKING CARE OF THIS PATIENT: 55 minutes.    Katharina Caper M.D on 06/24/2015 at 2:37 PM  Between 7am to 6pm - Pager - [phone] After 6pm  go to [email or web address] - password [email or web address] [facility]  [facility]  Office  [phone]  CC: Primary care physician; Barbette Reichmann, MD

## 2015-06-24 NOTE — ED Notes (Signed)
Pt has had difficulty sleeping because of difficulty breathing and it got bad this morning.  Pt tripoding in car and unable to speak in full sentences.

## 2015-06-24 NOTE — Progress Notes (Signed)
Patient's cardizem drip has been titrated down to 5 over the last couple of hours. Heart rate maintains in the 70's-80's. When the patient gets up or moves around heart rate goes to the 90's. Patient is still short of breath with exertion and she states she has some discomfort in her chest from the fluid build up. Currently on 3L and oxygen saturation is in the high 90's.

## 2015-06-25 ENCOUNTER — Inpatient Hospital Stay
Admit: 2015-06-25 | Discharge: 2015-06-25 | Disposition: A | Discharge status: Home / Self Care | Payer: Medicare Other | Attending: Internal Medicine | Admitting: Internal Medicine

## 2015-06-25 ENCOUNTER — Inpatient Hospital Stay: Payer: Medicare Other

## 2015-06-25 LAB — GLUCOSE, CAPILLARY
Glucose-Capillary: 173 mg/dL — ABNORMAL HIGH (ref 65–99)
Glucose-Capillary: 156 mg/dL — ABNORMAL HIGH (ref 65–99)
Glucose-Capillary: 181 mg/dL — ABNORMAL HIGH (ref 65–99)
Glucose-Capillary: 193 mg/dL — ABNORMAL HIGH (ref 65–99)

## 2015-06-25 LAB — COMPREHENSIVE METABOLIC PANEL
ALT: 16 U/L (ref 14–54)
AST: 23 U/L (ref 15–41)
Albumin: 3.8 g/dL (ref 3.5–5.0)
Alkaline Phosphatase: 48 U/L (ref 38–126)
Anion gap: 9 (ref 5–15)
BUN: 16 mg/dL (ref 6–20)
CO2: 26 mmol/L (ref 22–32)
Calcium: 8.8 mg/dL — ABNORMAL LOW (ref 8.9–10.3)
Chloride: 100 mmol/L — ABNORMAL LOW (ref 101–111)
Creatinine, Ser: 0.87 mg/dL (ref 0.44–1.00)
GFR calc Af Amer: >60 mL/min (ref >60)
GFR calc non Af Amer: >60 mL/min (ref >60)
Glucose, Bld: 175 mg/dL — ABNORMAL HIGH (ref 65–99)
Potassium: 3.2 mmol/L — ABNORMAL LOW (ref 3.5–5.1)
Sodium: 135 mmol/L (ref 135–145)
Total Bilirubin: 1.4 mg/dL — ABNORMAL HIGH (ref 0.3–1.2)
Total Protein: 7.3 g/dL (ref 6.5–8.1)

## 2015-06-25 LAB — PROTIME-INR
INR: 2.29
Prothrombin Time: 25 seconds — ABNORMAL HIGH (ref 11.4–15.0)

## 2015-06-25 LAB — ECHOCARDIOGRAM COMPLETE
Height: 62 in
Weight: 2857.6 oz

## 2015-06-25 LAB — HEMOGLOBIN A1C: Hgb A1c MFr Bld: 7.9 % — ABNORMAL HIGH (ref 4.0–6.0)

## 2015-06-25 LAB — TSH: TSH: 0.661 u[IU]/mL (ref 0.350–4.500)

## 2015-06-25 LAB — TROPONIN I: Troponin I: 0.28 ng/mL — ABNORMAL HIGH (ref <0.031)

## 2015-06-25 MED ORDER — MAGNESIUM SULFATE 2 GM/50ML IV SOLN
2.0000 g | Freq: Once | INTRAVENOUS | Status: DC
  Filled 2015-06-25: qty 50

## 2015-06-25 MED ORDER — POTASSIUM CHLORIDE CRYS ER 20 MEQ PO TBCR
20.0000 meq | EXTENDED_RELEASE_TABLET | Freq: Two times a day (BID) | ORAL | Status: DC
  Administered 2015-06-25 – 2015-06-26 (×2): 20 meq via ORAL
  Filled 2015-06-25 (×2): qty 1

## 2015-06-25 MED ORDER — POTASSIUM CHLORIDE CRYS ER 20 MEQ PO TBCR
40.0000 meq | EXTENDED_RELEASE_TABLET | Freq: Once | ORAL | Status: AC
  Administered 2015-06-25: 40 meq via ORAL
  Filled 2015-06-25: qty 2

## 2015-06-25 MED ORDER — WARFARIN - PHARMACIST DOSING INPATIENT
Freq: Every day | Status: DC
  Administered 2015-06-25: 18:00:00

## 2015-06-25 MED ORDER — CARVEDILOL 6.25 MG PO TABS
6.2500 mg | ORAL_TABLET | Freq: Two times a day (BID) | ORAL | Status: DC
  Administered 2015-06-25 – 2015-06-26 (×2): 6.25 mg via ORAL
  Filled 2015-06-25 (×2): qty 1

## 2015-06-25 MED ORDER — DIGOXIN 250 MCG PO TABS
0.2500 mg | ORAL_TABLET | Freq: Every day | ORAL | Status: DC
  Administered 2015-06-25 – 2015-06-26 (×2): 0.25 mg via ORAL
  Filled 2015-06-25 (×2): qty 1

## 2015-06-25 MED ORDER — DILTIAZEM HCL ER COATED BEADS 120 MG PO CP24
120.0000 mg | ORAL_CAPSULE | Freq: Two times a day (BID) | ORAL | Status: DC
  Administered 2015-06-26: 120 mg via ORAL
  Filled 2015-06-25 (×2): qty 1

## 2015-06-25 NOTE — Progress Notes (Signed)
*  PRELIMINARY RESULTS* Echocardiogram 2D Echocardiogram has been performed.  Georgann Housekeeper Hege 06/25/2015, 9:12 AM

## 2015-06-25 NOTE — Progress Notes (Signed)
Dr. Elisabeth Pigeon ordered a dose of IV magnesium. Spoke with MD and told him that patient received IV mag last night and is taking PO tablets twice a day now and a mag level was never rechecked. MD stated okay not to run IV magnesium now and just recheck lab in the morning. Patient has some questions for Dr. Lady Gary about new medications and her ECHO. MD is now on the floor and will see patient. Patient has been educated on new meds digoxin and coreg.

## 2015-06-25 NOTE — Care Management (Signed)
Independent in all adls, denies issues accessing medical care, obtaining medications or with transportation.  Current with her PCP- Dr Adriana Reams.  Patient admitted with shortness of breath and swelling.  CXR concerning for pulmonary edema.  Cardiology is consulting. )2 currently at 3 liters.  Discussed the need to wean and assess for need of home 02 during progression.

## 2015-06-25 NOTE — Clinical Documentation Improvement (Signed)
Internal Medicine  Can the diagnosis of Atrial Fibrillation be further specified? Please document response in next progress note. Thank you!   Chronic Atrial fibrillation  Paroxysmal Atrial fibrillation  Permanent Atrial fibrillation  Persistent Atrial fibrillation  Other  Clinically Undetermined  Document any associated diagnoses/conditions.  Supporting Information:  Long term use of Warfarin  Please exercise your independent, professional judgment when responding. A specific answer is not anticipated or expected.  Thank You,  Shellee Milo RN, BSN, CCDS Health Information Management [place] [phone]; Cell: [phone]

## 2015-06-25 NOTE — Consult Note (Signed)
   [facility] CM Inpatient Consult   06/25/2015  Deborah Dawson May 07, 1950 [id number]    Referral received to assess for care management services.    Met with the patient regarding the benefits of [place] [facility] services.Explained that [facility] is a covered benefit of her [employer]. Review information for [facility] Management and provider pamphlet with contact information.  Explained that [facility] does not interfere with or replace any services arranged by the inpatient care management staff.  Patient declined services with [place] [facility].  Rutherford Limerick RN, [facility] Liaison  [phone]) Business Mobile [phone]) Toll free office

## 2015-06-25 NOTE — Consult Note (Signed)
ANTICOAGULATION CONSULT NOTE - Initial Consult  Pharmacy Consult for warfarin Indication: atrial fibrillation  Allergies  Allergen Reactions  . Gluten Meal Hives and Rash  . Augmentin [Amoxicillin-Pot Clavulanate] Diarrhea  . Red Dye Rash    Patient Measurements: Height: 5\' 2"  (157.5 cm) (stated) Weight: 178 lb 9.6 oz (81.012 kg) IBW/kg (Calculated) : 50.1 Heparin Dosing Weight:   Vital Signs: Temp: 98.1 F (36.7 C) (04/03 1130) Temp Source: Oral (04/03 1130) BP: 119/74 mmHg (04/03 1130) Pulse Rate: 107 (04/03 1130)  Labs:  Recent Labs  06/24/15 1009 06/24/15 1642 06/24/15 2206 06/25/15 0406  HGB 13.8  --   --   --   HCT 42.6  --   --   --   PLT 363  --   --   --   LABPROT 23.8*  --   --  25.0*  INR 2.15  --   --  2.29  CREATININE 0.74  --   --  0.87  TROPONINI <0.03 0.38* 0.48* 0.28*    Estimated Creatinine Clearance: 62.8 mL/min (by C-G formula based on Cr of 0.87).   Medical History: Past Medical History  Diagnosis Date  . Atrial fibrillation (HCC) 2010  . Joint pain     Medications:  Scheduled:  . aspirin EC  81 mg Oral Daily  . carvedilol  6.25 mg Oral BID WC  . cloNIDine  0.2 mg Oral TID  . digoxin  0.25 mg Oral Daily  . diltiazem  120 mg Oral BID  . furosemide  20 mg Intravenous BID  . glimepiride  2 mg Oral QPM  . glimepiride  4 mg Oral [id number]  . insulin aspart  0-5 Units Subcutaneous QHS  . insulin aspart  0-9 Units Subcutaneous TID WC  . insulin aspart  4 Units Subcutaneous TID WC  . magnesium sulfate 1 - 4 g bolus IVPB  2 g Intravenous Once  . metFORMIN  1,000 mg Oral BID WC  . potassium chloride  20 mEq Oral BID  . sodium chloride flush  3 mL Intravenous Q12H  . sodium chloride flush  3 mL Intravenous Q12H  . warfarin  2.5 mg Oral Once per day on Mon Tue Wed Thu Fri  . warfarin  3 mg Oral Once per day on Sun Sat    Assessment: Pt is a 65 year old female with a PMH of afib on warfarin. INR on admission is therapeutic at 2.29. Home  dose is 2.5mg  on Monday-Friday and 3mg  on sat and Sunday.   Goal of Therapy:  INR 2-3 Monitor platelets by anticoagulation protocol: Yes   Plan: Continue pt home dose of warfarin. Will recheck INR in the AM.   Olene Floss, Pharm.D Clinical Pharmacist   06/25/2015,3:24 PM

## 2015-06-25 NOTE — Progress Notes (Signed)
Inpatient Diabetes Program Recommendations  AACE/ADA: New Consensus Statement on Inpatient Glycemic Control (2015)  Target Ranges:  Prepandial:   less than 140 mg/dL      Peak postprandial:   less than 180 mg/dL (1-2 hours)      Critically ill patients:  140 - 180 mg/dL   Review of Glycemic Control  Results for Deborah Dawson, Deborah Dawson (MRN [id number]) as of 06/25/2015 14:01  Ref. Range 06/25/2015 07:31 06/25/2015 11:31  Glucose-Capillary Latest Ref Range: 65-99 mg/dL 824 (H) 235 (H)    Diabetes history: Type 2, A1C 7.9% on 06/24/15 Outpatient Diabetes medications: Amaryl 4mg  qam, 2mg  qpm, Metformin 1000mg  bid Current orders for Inpatient glycemic control: Amaryl 4mg  qam, 2mg  qpm, Metformin 1000mg  bid, Novolog 4 units tid with meals, Novolog 0-9 units tid, Novolog 0-5 units qhs  Inpatient Diabetes Program Recommendations:  Spoke with patient re: her elevated A1C- she tells me it is much improved from her 13 % and 9% A1C that she has recently had. I discussed the need for mealtime and correction insulin while in the hospital and the goals for tight control based on the American Diabetes Association guidelines.  She was reluctantly agreeable. Spoke with RN Tacey Ruiz re: conversation with patient.   Consider d/c Glipizide while patient is inpatient- high risk for hypoglycemia.  Better treated with basal insulin while inpatient.   Susette Racer, RN, BA, MHA, CDE Diabetes Coordinator Inpatient Diabetes Program  [phone] (Team Pager) [phone] [facility] Office) 06/25/2015 2:34 PM

## 2015-06-25 NOTE — Consult Note (Signed)
[facility] CLINIC CARDIOLOGY [facility]  CARDIOLOGY CONSULT NOTE  Patient ID: Deborah Dawson MRN: [id number] DOB/AGE: 67-Nov-1952 52 y.o.  Admit date: 06/24/2015 Referring Physician Dr. Elisabeth Pigeon Primary Physician  Dr. Daisy Lazar Primary Cardiologist   Reason for Consultation   HPI: Pt is a 65 yo female with history of copd, hypertension, dm admitted after presenting with sob. Noted to be in afib with rvr with aberrant conduction at rates up toe 180 treated with iv cardizem. She was noted to have diffuse pulmonary infiltrate on cxr c/w pulmonary edema. She was diuresed with iv lasix with improvement. She has ruled out for an mi. Echo showed severely reduced lv function with ef of 20-25% with mild lv and la enlargement. She has imiproved with rate control and diuresis. She had a cardaic cath in 2010 with normal coronary arteries and reported normal lv funciton at that time. Elevated serum troponin appears to be secondary to chf and tachycardia rather than acute coronary syndrome.   Review of Systems  Constitutional: Positive for malaise/fatigue.  HENT: Negative.   Eyes: Negative.   Respiratory: Positive for shortness of breath.   Cardiovascular: Positive for chest pain and leg swelling.  Gastrointestinal: Negative.   Genitourinary: Negative.   Musculoskeletal: Negative.   Skin: Negative.   Neurological: Negative.   Endo/Heme/Allergies: Negative.   Psychiatric/Behavioral: Negative.     Past Medical History  Diagnosis Date  . Atrial fibrillation (HCC) 2010  . Joint pain     Family History  Problem Relation Age of Onset  . Cancer Mother 18    breast  . Diabetes Mother   . Breast cancer Mother 22    Social History   Social History  . Marital Status: Married    Spouse Name: N/A  . Number of Children: N/A  . Years of Education: N/A   Occupational History  . Not on file.   Social History Main Topics  . Smoking status: Never Smoker   . Smokeless tobacco: Never Used  .  Alcohol Use: No  . Drug Use: No  . Sexual Activity: Not on file   Other Topics Concern  . Not on file   Social History Narrative    Past Surgical History  Procedure Laterality Date  . Abdominal hysterectomy  1985  . Tubal ligation  1981  . Breast surgery Left 1997    lipoma  . Cholecystectomy  1986  . Breast excisional biopsy Left     negative years ago     Prescriptions prior to admission  Medication Sig Dispense Refill Last Dose  . cloNIDine (CATAPRES) 0.2 MG tablet Take 0.2 mg by mouth 3 (three) times daily.   06/24/2015 at Unknown time  . diltiazem (CARDIZEM CD) 180 MG 24 hr capsule Take 180 mg by mouth every morning.    06/24/2015 at Unknown time  . glimepiride (AMARYL) 2 MG tablet Take 2 mg by mouth every evening. *note dose*   06/23/2015 at Unknown time  . glimepiride (AMARYL) 4 MG tablet Take 4 mg by mouth every morning. *note dose*   06/24/2015 at Unknown time  . indapamide (LOZOL) 1.25 MG tablet Take 1.25 mg by mouth every morning.   3 06/24/2015 at Unknown time  . metFORMIN (GLUCOPHAGE) 1000 MG tablet Take 1,000 mg by mouth 2 (two) times daily with a meal.   0 06/24/2015 at Unknown time  . polyethylene glycol powder (GLYCOLAX/MIRALAX) powder 255 grams one bottle for colonoscopy prep 255 g 0 haven't started yet  .  warfarin (COUMADIN) 2.5 MG tablet Take 2.5 mg by mouth every evening. Take Monday through Friday only. *note dose*  0 06/22/2015  . warfarin (COUMADIN) 3 MG tablet Take 3 mg by mouth every evening. Take on Saturday and Sunday only. *note dose*   06/23/2015 at Unknown time    Physical Exam: Blood pressure 119/74, pulse 107, temperature 98.1 F (36.7 C), temperature source Oral, resp. rate 19, height 5\' 2"  (1.575 m), weight 81.012 kg (178 lb 9.6 oz), SpO2 98 %.   Wt Readings from Last 1 Encounters:  06/25/15 81.012 kg (178 lb 9.6 oz)     General appearance: alert and cooperative Head: Normocephalic, without obvious abnormality, atraumatic Resp: rhonchi  bilaterally Cardio: irregularly irregular rhythm GI: soft, non-tender; bowel sounds normal; no masses,  no organomegaly Neurologic: Grossly normal  Labs:   Lab Results  Component Value Date   WBC 13.5* 06/24/2015   HGB 13.8 06/24/2015   HCT 42.6 06/24/2015   MCV 85.0 06/24/2015   PLT 363 06/24/2015    Recent Labs Lab 06/25/15 0406  NA 135  K 3.2*  CL 100*  CO2 26  BUN 16  CREATININE 0.87  CALCIUM 8.8*  PROT 7.3  BILITOT 1.4*  ALKPHOS 48  ALT 16  AST 23  GLUCOSE 175*   Lab Results  Component Value Date   TROPONINI 0.28* 06/25/2015      Radiology: Mild pulmonary edema EKG: afib with variable vr.   ASSESSMENT AND PLAN:  66 yo female with history of afib treated with rate control and chronic anticoagulation with warfaron who was admitted with sob and tachycardia. Noted to have afib with rvr with transient aberrancy currtly rate controlled with diltiazem and digoxin. Has been doing well until day of admission when she noted progressive sob and rapid deart rate. CXR showed mild edema. Echo showed reduced lv funciton. Mild troponin elevation constant with demand ischeia dur to chf and afib with rvr.  Etiology of reduced lv funciotn is unclear. Normal coronary aarteries 7 years ago. Possible seocndary to tachcyardia vs idiopathic. Will attempt to treate with beta blockers, after load reducion and gentle diuresis. When sable on evidence based drugs as outptient may need reevaluaiton with cardiac to evaluate anatomy. Daily eights, low sodium diet. Will add ace as blood pressue allows. Have laced on 6.25 mg of po carvedlol. Will attempt to wean cardizm as pressure and heart rae tolerates.  Signed: Dalia Heading MD, [facility] 06/25/2015, 2:12 PM

## 2015-06-25 NOTE — Progress Notes (Signed)
Patient has been alert and oriented. Vitals stable. Afib on tele, rate controlled. Patient can go up to 150 on the tele while ambulating to the bathroom, but comes back down to 100 as she sits down. Given new dose of digoxin on top of cardizem today and heart rate is currently in the 70's and 80's at rest. Patient's breathing status is much better today as well. She is currently off oxygen and saturation is at 95% on room air. No complaints of pain, but states she can still feel some fluid in her chest. Chest xray showed improvement today. Will continue to monitor.

## 2015-06-26 LAB — BASIC METABOLIC PANEL
Anion gap: 7 (ref 5–15)
BUN: 23 mg/dL — ABNORMAL HIGH (ref 6–20)
CO2: 26 mmol/L (ref 22–32)
Calcium: 9 mg/dL (ref 8.9–10.3)
Chloride: 102 mmol/L (ref 101–111)
Creatinine, Ser: 0.78 mg/dL (ref 0.44–1.00)
GFR calc Af Amer: >60 mL/min (ref >60)
GFR calc non Af Amer: >60 mL/min (ref >60)
Glucose, Bld: 166 mg/dL — ABNORMAL HIGH (ref 65–99)
Potassium: 3.7 mmol/L (ref 3.5–5.1)
Sodium: 135 mmol/L (ref 135–145)

## 2015-06-26 LAB — GLUCOSE, CAPILLARY: Glucose-Capillary: 111 mg/dL — ABNORMAL HIGH (ref 65–99)

## 2015-06-26 LAB — PROTIME-INR
INR: 2.28
Prothrombin Time: 24.9 seconds — ABNORMAL HIGH (ref 11.4–15.0)

## 2015-06-26 LAB — MAGNESIUM: Magnesium: 1.7 mg/dL (ref 1.7–2.4)

## 2015-06-26 MED ORDER — POTASSIUM CHLORIDE CRYS ER 20 MEQ PO TBCR: 20.0000 meq | EXTENDED_RELEASE_TABLET | Freq: Every day | ORAL | Status: DC

## 2015-06-26 MED ORDER — DIGOXIN 250 MCG PO TABS: 0.2500 mg | ORAL_TABLET | Freq: Every day | ORAL | Status: DC

## 2015-06-26 MED ORDER — CARVEDILOL 6.25 MG PO TABS: 6.2500 mg | ORAL_TABLET | Freq: Two times a day (BID) | ORAL | Status: DC

## 2015-06-26 MED ORDER — FUROSEMIDE 40 MG PO TABS: 40.0000 mg | ORAL_TABLET | Freq: Every day | ORAL | Status: DC

## 2015-06-26 MED ORDER — FUROSEMIDE 20 MG PO TABS: 20.0000 mg | ORAL_TABLET | Freq: Every day | ORAL | Status: DC

## 2015-06-26 MED ORDER — ASPIRIN 81 MG PO TBEC: 81.0000 mg | DELAYED_RELEASE_TABLET | Freq: Every day | ORAL | Status: DC

## 2015-06-26 NOTE — Progress Notes (Signed)
Patient is discharge home in a stable condition , summary and f/u given to both pt's and husband , verbalized understanding , left with husband

## 2015-06-26 NOTE — Consult Note (Signed)
ANTICOAGULATION CONSULT NOTE - Initial Consult  Pharmacy Consult for warfarin Indication: atrial fibrillation  Allergies  Allergen Reactions  . Gluten Meal Hives and Rash  . Augmentin [Amoxicillin-Pot Clavulanate] Diarrhea  . Red Dye Rash    Patient Measurements: Height: 5\' 2"  (157.5 cm) (stated) Weight: 180 lb (81.647 kg) IBW/kg (Calculated) : 50.1 Heparin Dosing Weight:   Vital Signs: Temp: 98.3 F (36.8 C) (04/04 1138) Temp Source: Oral (04/04 1138) BP: 133/62 mmHg (04/04 1138) Pulse Rate: 90 (04/04 1138)  Labs:  Recent Labs  06/24/15 1009 06/24/15 1642 06/24/15 2206 06/25/15 0406 06/26/15 0601  HGB 13.8  --   --   --   --   HCT 42.6  --   --   --   --   PLT 363  --   --   --   --   LABPROT 23.8*  --   --  25.0* 24.9*  INR 2.15  --   --  2.29 2.28  CREATININE 0.74  --   --  0.87 0.78  TROPONINI <0.03 0.38* 0.48* 0.28*  --     Estimated Creatinine Clearance: 68.5 mL/min (by C-G formula based on Cr of 0.78).   Medical History: Past Medical History  Diagnosis Date  . Atrial fibrillation (HCC) 2010  . Joint pain     Medications:  Scheduled:  . aspirin EC  81 mg Oral Daily  . carvedilol  6.25 mg Oral BID WC  . cloNIDine  0.2 mg Oral TID  . digoxin  0.25 mg Oral Daily  . diltiazem  120 mg Oral BID  . furosemide  20 mg Intravenous BID  . glimepiride  2 mg Oral QPM  . glimepiride  4 mg Oral [id number]  . insulin aspart  0-5 Units Subcutaneous QHS  . insulin aspart  0-9 Units Subcutaneous TID WC  . insulin aspart  4 Units Subcutaneous TID WC  . metFORMIN  1,000 mg Oral BID WC  . potassium chloride  20 mEq Oral BID  . sodium chloride flush  3 mL Intravenous Q12H  . sodium chloride flush  3 mL Intravenous Q12H  . warfarin  2.5 mg Oral Once per day on Mon Tue Wed Thu Fri  . warfarin  3 mg Oral Once per day on Sun Sat  . Warfarin - Pharmacist Dosing Inpatient   Does not apply q1800    Assessment: Pt is a 65 year old female with a PMH of afib on warfarin.  INR on admission is therapeutic at 2.29. Home dose is 2.5mg  on Monday-Friday and 3mg  on sat and Sunday.   4/4 INR: 2.28  Goal of Therapy:  INR 2-3 Monitor platelets by anticoagulation protocol: Yes   Plan: Continue pt home dose of warfarin. Will recheck INR in the AM.   Clovia Cuff, PharmD, BCPS 06/26/2015 1:24 PM

## 2015-06-26 NOTE — Progress Notes (Signed)
[facility] - [place] at [facility]   PATIENT NAME: Deborah Dawson    MR#:  [id number]  DATE OF BIRTH:  Feb 11, 1951  SUBJECTIVE:  CHIEF COMPLAINT:   Chief Complaint  Patient presents with  . Respiratory Distress   Came with respu distress, after diuresis feels much better. Found to have severely reduced EF on echo.  REVIEW OF SYSTEMS:  CONSTITUTIONAL: No fever, fatigue or weakness.  EYES: No blurred or double vision.  EARS, NOSE, AND THROAT: No tinnitus or ear pain.  RESPIRATORY: No cough, positive for shortness of breath, no wheezing or hemoptysis.  CARDIOVASCULAR: No chest pain, orthopnea, edema.  GASTROINTESTINAL: No nausea, vomiting, diarrhea or abdominal pain.  GENITOURINARY: No dysuria, hematuria.  ENDOCRINE: No polyuria, nocturia,  HEMATOLOGY: No anemia, easy bruising or bleeding SKIN: No rash or lesion. MUSCULOSKELETAL: No joint pain or arthritis.   NEUROLOGIC: No tingling, numbness, weakness.  PSYCHIATRY: No anxiety or depression.   ROS  DRUG ALLERGIES:   Allergies  Allergen Reactions  . Gluten Meal Hives and Rash  . Augmentin [Amoxicillin-Pot Clavulanate] Diarrhea  . Red Dye Rash    VITALS:  Blood pressure 147/94, pulse 83, temperature 98.6 F (37 C), temperature source Oral, resp. rate 18, height 5\' 2"  (1.575 m), weight 81.647 kg (180 lb), SpO2 98 %.  PHYSICAL EXAMINATION:  GENERAL:  65 y.o.-year-old patient lying in the bed with no acute distress.  EYES: Pupils equal, round, reactive to light and accommodation. No scleral icterus. Extraocular muscles intact.  HEENT: Head atraumatic, normocephalic. Oropharynx and nasopharynx clear.  NECK:  Supple, no jugular venous distention. No thyroid enlargement, no tenderness.  LUNGS: Normal breath sounds bilaterally, no wheezing, mild crepitation. No use of accessory muscles of respiration.  CARDIOVASCULAR: S1, S2 normal. No murmurs, rubs, or gallops.  ABDOMEN: Soft, nontender, nondistended. Bowel sounds  present. No organomegaly or mass.  EXTREMITIES: No pedal edema, cyanosis, or clubbing.  NEUROLOGIC: Cranial nerves II through XII are intact. Muscle strength 5/5 in all extremities. Sensation intact. Gait not checked.  PSYCHIATRIC: The patient is alert and oriented x 3.  SKIN: No obvious rash, lesion, or ulcer.   Physical Exam LABORATORY PANEL:   CBC  Recent Labs Lab 06/24/15 1009  WBC 13.5*  HGB 13.8  HCT 42.6  PLT 363   ------------------------------------------------------------------------------------------------------------------  Chemistries   Recent Labs Lab 06/24/15 1642 06/25/15 0406  NA  --  135  K  --  3.2*  CL  --  100*  CO2  --  26  GLUCOSE  --  175*  BUN  --  16  CREATININE  --  0.87  CALCIUM  --  8.8*  MG 1.4*  --   AST  --  23  ALT  --  16  ALKPHOS  --  48  BILITOT  --  1.4*   ------------------------------------------------------------------------------------------------------------------  Cardiac Enzymes  Recent Labs Lab 06/24/15 2206 06/25/15 0406  TROPONINI 0.48* 0.28*   ------------------------------------------------------------------------------------------------------------------  RADIOLOGY:  X-ray Chest Pa And Lateral  06/25/2015  CLINICAL DATA:  Congestive heart failure. Chronic atrial fibrillation. Shortness breath. Hypoxia. Tachycardia. EXAM: CHEST  2 VIEW COMPARISON:  06/24/2015 FINDINGS: Heart size remains at the upper limits of normal. Decreased interstitial prominence is seen especially in the lung bases, consistent with decreased interstitial edema. No evidence of pulmonary consolidation or pleural effusion. IMPRESSION: Decreased pulmonary interstitial edema compared to previous study. Electronically Signed   By: Myles Rosenthal M.D.   On: 06/25/2015 07:57    ASSESSMENT AND PLAN:  Active Problems:   Acute pulmonary edema (HCC)   Acute respiratory failure with hypoxia (HCC)   Malignant essential hypertension   Chest pain    Atrial fibrillation with RVR (HCC)   Leukocytosis   Heart failure (HCC)   #1. Acute pulmonary edema, Ac systolic CHF,   on Lasix stopped nitroglycerin,    Monitor in's and outs and patient's weight,   reviewed  echocardiogram - severely reduced EF   Appreciated cardio help. #2. Acute respiratory failure with hypoxia of 82% on room air on arrival to emergency room,   requiring BiPAP administration, now diuresed and off BiPAP, continue oxygen therapy as needed #3. Malignant essential hypertension,  continue outpatient medications, adding nitroglycerin topically, advanced medications as needed.  #4. Chest pain, possibly related to work or breathing, however, cannot rule out A. fib, RVR/demand ischemia related, continue Cardizem, aspirin, nitroglycerin, negatvie cardiac enzymes 3 #5. A. fib, RVR,  Was on cardizem IV, now on oral,also added digoxin by cardio  continue Coumadin, following ProTime INR #6. Leukocytosis, unclear etiology, may be related to stress,  No antibiotics, feels better.  All the records are reviewed and case discussed with Care Management/Social Workerr. Management plans discussed with the patient, family and they are in agreement.  CODE STATUS: full  TOTAL TIME TAKING CARE OF THIS PATIENT: 35 minutes.     POSSIBLE D/C IN 1-2 DAYS, DEPENDING ON CLINICAL CONDITION.   Altamese Dilling M.D on 06/26/2015   Between 7am to 6pm - Pager - [phone]  After 6pm go to [email or web address] - password [email or web address]  [facility]  Office  [phone]  CC: Primary care physician; Barbette Reichmann, MD  Note: This dictation was prepared with Dragon dictation along with smaller phrase technology. Any transcriptional errors that result from this process are unintentional.

## 2015-06-26 NOTE — Progress Notes (Signed)
Dietitian Consult received for Diet Education. Pt discharged to home. Handout on Carbohydrate Counting for People with Diabetes" mailed to pt home.   Romelle Starcher MS, RD, LDN [phone] Pager  [phone] Weekend/On-Call Pager

## 2015-06-26 NOTE — Care Management (Signed)
Patient is for discharge today.  Discussed  during progression of need to obtain room air exertional sats prior to discharge and to notify CM if sats are 88 or less

## 2015-06-26 NOTE — Progress Notes (Signed)
[facility]  SUBJECTIVE: Feels better. Less sob   Filed Vitals:   06/25/15 2001 06/26/15 0509 06/26/15 0815 06/26/15 1138  BP: 100/61 132/76 147/94 133/62  Pulse: 57 73 83 90  Temp: 98.2 F (36.8 C) 97.6 F (36.4 C) 98.6 F (37 C) 98.3 F (36.8 C)  TempSrc: Oral  Oral Oral  Resp: 18 16 18 18   Height:      Weight:  81.647 kg (180 lb)    SpO2: 92% 95% 98% 96%    Intake/Output Summary (Last 24 hours) at 06/26/15 1421 Last data filed at 06/26/15 1347  Gross per 24 hour  Intake    720 ml  Output   1550 ml  Net   -830 ml    LABS: Basic Metabolic Panel:  Recent Labs  97/94/80 1642 06/25/15 0406 06/26/15 0601  NA  --  135 135  K  --  3.2* 3.7  CL  --  100* 102  CO2  --  26 26  GLUCOSE  --  175* 166*  BUN  --  16 23*  CREATININE  --  0.87 0.78  CALCIUM  --  8.8* 9.0  MG 1.4*  --  1.7   Liver Function Tests:  Recent Labs  06/24/15 1009 06/25/15 0406  AST 26 23  ALT 16 16  ALKPHOS 50 48  BILITOT 0.7 1.4*  PROT 8.1 7.3  ALBUMIN 4.2 3.8   No results for input(s): LIPASE, AMYLASE in the last 72 hours. CBC:  Recent Labs  06/24/15 1009  WBC 13.5*  NEUTROABS 8.2*  HGB 13.8  HCT 42.6  MCV 85.0  PLT 363   Cardiac Enzymes:  Recent Labs  06/24/15 1642 06/24/15 2206 06/25/15 0406  TROPONINI 0.38* 0.48* 0.28*   BNP: Invalid input(s): POCBNP D-Dimer: No results for input(s): DDIMER in the last 72 hours. Hemoglobin A1C:  Recent Labs  06/24/15 1642  HGBA1C 7.9*   Fasting Lipid Panel: No results for input(s): CHOL, HDL, LDLCALC, TRIG, CHOLHDL, LDLDIRECT in the last 72 hours. Thyroid Function Tests:  Recent Labs  06/25/15 0406  TSH 0.661   Anemia Panel: No results for input(s): VITAMINB12, FOLATE, FERRITIN, TIBC, IRON, RETICCTPCT in the last 72 hours.   Physical Exam: Blood pressure 133/62, pulse 90, temperature 98.3 F (36.8 C), temperature source Oral, resp. rate 18, height 5\' 2"  (1.575 m), weight  81.647 kg (180 lb), SpO2 96 %.   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 Continue with dilt 120 mg daily; carvedilol 6.25 mg bid, dig 0.25; clonidine 0.2 bid; lasix 40 mg po daily;   OK for discharge form cardiac standpoint   Acute respiratory failure with hypoxia (HCC)   Malignant essential hypertension   Chest pain   Atrial fibrillation with RVR (HCC)-contineu with ragte control with dig, cardizem and anticoagulate with warfarin with inr goal of 2-3   Leukocytosis   Heart failure (HCC)    Dalia Heading., MD, [facility] 06/26/2015 2:21 PM

## 2015-06-26 NOTE — Discharge Summary (Signed)
[facility] - [place] at [facility]   PATIENT NAME: Deborah Dawson    MR#:  [id number]  DATE OF BIRTH:  1950/04/16  DATE OF ADMISSION:  06/24/2015 ADMITTING PHYSICIAN: Katharina Caper, MD  DATE OF DISCHARGE: 06/26/2015  PRIMARY CARE PHYSICIAN: Barbette Reichmann, MD    ADMISSION DIAGNOSIS:  Respiratory distress [R06.00] Flash pulmonary edema (HCC) [J81.0] Hypoxia [R09.02] Atrial fibrillation with rapid ventricular response (HCC) [I48.91]  DISCHARGE DIAGNOSIS:  Active Problems:   Acute pulmonary edema (HCC)   Acute respiratory failure with hypoxia (HCC)   Malignant essential hypertension   Chest pain   Atrial fibrillation with RVR (HCC)   Leukocytosis   Heart failure (HCC)   Ac systolic CHF  SECONDARY DIAGNOSIS:   Past Medical History  Diagnosis Date  . Atrial fibrillation (HCC) 2010  . Joint pain     HOSPITAL COURSE:   #1. Acute pulmonary edema, Ac systolic CHF,  on Lasix stopped nitroglycerin,   Monitor in's and outs and patient's weight,  reviewed echocardiogram - severely reduced EF  Appreciated cardio help.   Advised to follow in office. #2. Acute respiratory failure with hypoxia of 82% on room air on arrival to emergency room,  requiring BiPAP administration, now diuresed and off BiPAP, continue oxygen therapy as needed   Tapered off, and stable on room air after dialysis. #3. Malignant essential hypertension,  continue outpatient medications, adding nitroglycerin topically, advanced medications as needed.  #4. Chest pain, possibly related to work or breathing, however, cannot rule out A. fib, RVR/demand ischemia related, continue Cardizem, aspirin, nitroglycerin, negatvie cardiac enzymes 3 #5. A. fib, RVR,  Was on cardizem IV, now on oral,also added digoxin by cardio continue Coumadin, following ProTime INR #6. Leukocytosis, unclear etiology, may be related to stress, No antibiotics, feels better.   DISCHARGE CONDITIONS:    Stable.  CONSULTS OBTAINED:  Treatment Team:  Dalia Heading, MD  DRUG ALLERGIES:   Allergies  Allergen Reactions  . Gluten Meal Hives and Rash  . Augmentin [Amoxicillin-Pot Clavulanate] Diarrhea  . Red Dye Rash    DISCHARGE MEDICATIONS:   Current Discharge Medication List    START taking these medications   Details  aspirin EC 81 MG EC tablet Take 1 tablet (81 mg total) by mouth daily. Qty: 30 tablet, Refills: 0    carvedilol (COREG) 6.25 MG tablet Take 1 tablet (6.25 mg total) by mouth 2 (two) times daily with a meal. Qty: 60 tablet, Refills: 0    digoxin (LANOXIN) 0.25 MG tablet Take 1 tablet (0.25 mg total) by mouth daily. Qty: 30 tablet, Refills: 0    furosemide (LASIX) 40 MG tablet Take 1 tablet (40 mg total) by mouth daily. Qty: 30 tablet, Refills: 0    potassium chloride SA (K-DUR,KLOR-CON) 20 MEQ tablet Take 1 tablet (20 mEq total) by mouth daily. Qty: 30 tablet, Refills: 0      CONTINUE these medications which have NOT CHANGED   Details  cloNIDine (CATAPRES) 0.2 MG tablet Take 0.2 mg by mouth 3 (three) times daily.    diltiazem (CARDIZEM CD) 180 MG 24 hr capsule Take 180 mg by mouth every morning.     !! glimepiride (AMARYL) 2 MG tablet Take 2 mg by mouth every evening. *note dose*    !! glimepiride (AMARYL) 4 MG tablet Take 4 mg by mouth every morning. *note dose*    indapamide (LOZOL) 1.25 MG tablet Take 1.25 mg by mouth every morning.  Refills: 3    metFORMIN (GLUCOPHAGE) 1000  MG tablet Take 1,000 mg by mouth 2 (two) times daily with a meal.  Refills: 0    polyethylene glycol powder (GLYCOLAX/MIRALAX) powder 255 grams one bottle for colonoscopy prep Qty: 255 g, Refills: 0    !! warfarin (COUMADIN) 2.5 MG tablet Take 2.5 mg by mouth every evening. Take Monday through Friday only. *note dose* Refills: 0    !! warfarin (COUMADIN) 3 MG tablet Take 3 mg by mouth every evening. Take on Saturday and Sunday only. *note dose*     !! - Potential  duplicate medications found. Please discuss with provider.       DISCHARGE INSTRUCTIONS:    Follow with cardio clinic in 1-2 weeks.  If you experience worsening of your admission symptoms, develop shortness of breath, life threatening emergency, suicidal or homicidal thoughts you must seek medical attention immediately by calling 911 or calling your MD immediately  if symptoms less severe.  You Must read complete instructions/literature along with all the possible adverse reactions/side effects for all the Medicines you take and that have been prescribed to you. Take any new Medicines after you have completely understood and accept all the possible adverse reactions/side effects.   Please note  You were cared for by a hospitalist during your hospital stay. If you have any questions about your discharge medications or the care you received while you were in the hospital after you are discharged, you can call the unit and asked to speak with the hospitalist on call if the hospitalist that took care of you is not available. Once you are discharged, your primary care physician will handle any further medical issues. Please note that NO REFILLS for any discharge medications will be authorized once you are discharged, as it is imperative that you return to your primary care physician (or establish a relationship with a primary care physician if you do not have one) for your aftercare needs so that they can reassess your need for medications and monitor your lab values.    Today   CHIEF COMPLAINT:   Chief Complaint  Patient presents with  . Respiratory Distress    HISTORY OF PRESENT ILLNESS:  [name]  is a 65 y.o. female with a known history of Multiple medical problems including chronic atrial fibrillation, on Coumadin therapy, hypertension, diabetes mellitus, who presents to the hospital with complaints of severe shortness of breath started last night . On arrival to emergency room,  patient was hypoxic with O2 sats of 82% on room air, requiring BiPAP administration, she was noted to have wide-complex tachycardia with a rate of 180, she was given Cardizem intravenously due to suspicion that wide-complex tachycardia was atrial fibrillation with aberrancy. After slowing patient's heart rate down. Atrial fibrillation was noted. Chest x-ray revealed diffuse pulmonary infiltrates with central peribronchial thickening favoring pulmonary edema. Patient was given Lasix intravenously as well as nitroglycerin sublingually and diuresed. She was taken off BiPAP, now she is on oxygen through nasal cannula since. Feels much more comfortable. Hospitalist services were contacted for admission. Patient also complains of chest pains, which happened prior to coming to the emergency room. She described this pain as tightness in the sternal area, intermittent pain with increasing whenever she exerted herself. She also admits of back pain, chronic in mid back. Patient's troponin was negative on arrival to the hospital.    VITAL SIGNS:  Blood pressure 133/62, pulse 90, temperature 98.3 F (36.8 C), temperature source Oral, resp. rate 18, height  (1.575 m), weight 81.647  kg (180 lb), SpO2 96 %.  I/O:   Intake/Output Summary (Last 24 hours) at 06/26/15 1448 Last data filed at 06/26/15 1347  Gross per 24 hour  Intake    720 ml  Output   1550 ml  Net   -830 ml    PHYSICAL EXAMINATION:  GENERAL:  65 y.o.-year-old patient lying in the bed with no acute distress.  EYES: Pupils equal, round, reactive to light and accommodation. No scleral icterus. Extraocular muscles intact.  HEENT: Head atraumatic, normocephalic. Oropharynx and nasopharynx clear.  NECK:  Supple, no jugular venous distention. No thyroid enlargement, no tenderness.  LUNGS: Normal breath sounds bilaterally, no wheezing, rales,rhonchi or crepitation. No use of accessory muscles of respiration.  CARDIOVASCULAR: S1, S2 normal. No  murmurs, rubs, or gallops.  ABDOMEN: Soft, non-tender, non-distended. Bowel sounds present. No organomegaly or mass.  EXTREMITIES: No pedal edema, cyanosis, or clubbing.  NEUROLOGIC: Cranial nerves II through XII are intact. Muscle strength 5/5 in all extremities. Sensation intact. Gait not checked.  PSYCHIATRIC: The patient is alert and oriented x 3.  SKIN: No obvious rash, lesion, or ulcer.   DATA REVIEW:   CBC  Recent Labs Lab 06/24/15 1009  WBC 13.5*  HGB 13.8  HCT 42.6  PLT 363    Chemistries   Recent Labs Lab 06/25/15 0406 06/26/15 0601  NA 135 135  K 3.2* 3.7  CL 100* 102  CO2 26 26  GLUCOSE 175* 166*  BUN 16 23*  CREATININE 0.87 0.78  CALCIUM 8.8* 9.0  MG  --  1.7  AST 23  --   ALT 16  --   ALKPHOS 48  --   BILITOT 1.4*  --     Cardiac Enzymes  Recent Labs Lab 06/25/15 0406  TROPONINI 0.28*    Microbiology Results  No results found for this or any previous visit.  RADIOLOGY:  X-ray Chest Pa And Lateral  06/25/2015  CLINICAL DATA:  Congestive heart failure. Chronic atrial fibrillation. Shortness breath. Hypoxia. Tachycardia. EXAM: CHEST  2 VIEW COMPARISON:  06/24/2015 FINDINGS: Heart size remains at the upper limits of normal. Decreased interstitial prominence is seen especially in the lung bases, consistent with decreased interstitial edema. No evidence of pulmonary consolidation or pleural effusion. IMPRESSION: Decreased pulmonary interstitial edema compared to previous study. Electronically Signed   By: Myles Rosenthal M.D.   On: 06/25/2015 07:57     Management plans discussed with the patient, family and they are in agreement.  CODE STATUS:     Code Status Orders        Start     Ordered   06/24/15 1615  Full code   Continuous     06/24/15 1614    Code Status History    Date Active Date Inactive Code Status Order ID Comments User Context   This patient has a current code status but no historical code status.      TOTAL TIME TAKING  CARE OF THIS PATIENT: 35 minutes.    Altamese Dilling M.D on 06/26/2015 at 2:48 PM  Between 7am to 6pm - Pager - [phone]  After 6pm go to [email or web address] - password [email or web address]  [facility]  Office  [phone]  CC: Primary care physician; Barbette Reichmann, MD   Note: This dictation was prepared with Dragon dictation along with smaller phrase technology. Any transcriptional errors that result from this process are unintentional.

## 2015-06-26 NOTE — Care Management Important Message (Signed)
Important Message  Patient Details  Name: Deborah Dawson MRN: [id number] Date of Birth: February 22, 1951   Medicare Important Message Given:  Yes    [name] A Loriann Bosserman 06/26/2015, 10:09 AM

## 2015-06-27 LAB — GLUCOSE, CAPILLARY: Glucose-Capillary: 176 mg/dL — ABNORMAL HIGH (ref 65–99)

## 2015-07-25 ENCOUNTER — Other Ambulatory Visit: Payer: Self-pay | Admitting: Cardiology

## 2015-08-01 ENCOUNTER — Encounter
Admission: RE | Disposition: A | Discharge status: Home / Self Care | Payer: Self-pay | Source: Ambulatory Visit | Attending: Cardiology

## 2015-08-01 ENCOUNTER — Ambulatory Visit
Admission: RE | Admit: 2015-08-01 | Discharge: 2015-08-01 | Disposition: A | Discharge status: Home / Self Care | Payer: Medicare Other | Source: Ambulatory Visit | Attending: Cardiology | Admitting: Cardiology

## 2015-08-01 DIAGNOSIS — I5022 Chronic systolic (congestive) heart failure: Secondary | ICD-10-CM | POA: Insufficient documentation

## 2015-08-01 DIAGNOSIS — R079 Chest pain, unspecified: Secondary | ICD-10-CM | POA: Diagnosis present

## 2015-08-01 DIAGNOSIS — I2510 Atherosclerotic heart disease of native coronary artery without angina pectoris: Secondary | ICD-10-CM | POA: Diagnosis not present

## 2015-08-01 DIAGNOSIS — I110 Hypertensive heart disease with heart failure: Secondary | ICD-10-CM | POA: Diagnosis not present

## 2015-08-01 DIAGNOSIS — Z9102 Food additives allergy status: Secondary | ICD-10-CM | POA: Diagnosis not present

## 2015-08-01 DIAGNOSIS — Z9049 Acquired absence of other specified parts of digestive tract: Secondary | ICD-10-CM | POA: Insufficient documentation

## 2015-08-01 DIAGNOSIS — Z8249 Family history of ischemic heart disease and other diseases of the circulatory system: Secondary | ICD-10-CM | POA: Insufficient documentation

## 2015-08-01 DIAGNOSIS — Z7984 Long term (current) use of oral hypoglycemic drugs: Secondary | ICD-10-CM | POA: Insufficient documentation

## 2015-08-01 DIAGNOSIS — Z888 Allergy status to other drugs, medicaments and biological substances status: Secondary | ICD-10-CM | POA: Insufficient documentation

## 2015-08-01 DIAGNOSIS — I481 Persistent atrial fibrillation: Secondary | ICD-10-CM | POA: Diagnosis not present

## 2015-08-01 DIAGNOSIS — L409 Psoriasis, unspecified: Secondary | ICD-10-CM | POA: Diagnosis not present

## 2015-08-01 DIAGNOSIS — Z880 Allergy status to penicillin: Secondary | ICD-10-CM | POA: Insufficient documentation

## 2015-08-01 DIAGNOSIS — Z90710 Acquired absence of both cervix and uterus: Secondary | ICD-10-CM | POA: Diagnosis not present

## 2015-08-01 DIAGNOSIS — Z809 Family history of malignant neoplasm, unspecified: Secondary | ICD-10-CM | POA: Diagnosis not present

## 2015-08-01 DIAGNOSIS — Z79899 Other long term (current) drug therapy: Secondary | ICD-10-CM | POA: Diagnosis not present

## 2015-08-01 DIAGNOSIS — Z7982 Long term (current) use of aspirin: Secondary | ICD-10-CM | POA: Diagnosis not present

## 2015-08-01 DIAGNOSIS — E119 Type 2 diabetes mellitus without complications: Secondary | ICD-10-CM | POA: Insufficient documentation

## 2015-08-01 DIAGNOSIS — Z7901 Long term (current) use of anticoagulants: Secondary | ICD-10-CM | POA: Diagnosis not present

## 2015-08-01 DIAGNOSIS — Z833 Family history of diabetes mellitus: Secondary | ICD-10-CM | POA: Diagnosis not present

## 2015-08-01 DIAGNOSIS — Z823 Family history of stroke: Secondary | ICD-10-CM | POA: Insufficient documentation

## 2015-08-01 DIAGNOSIS — I429 Cardiomyopathy, unspecified: Secondary | ICD-10-CM | POA: Diagnosis not present

## 2015-08-01 HISTORY — DX: Other specified postprocedural states: Z98.890

## 2015-08-01 HISTORY — PX: CARDIAC CATHETERIZATION: SHX172

## 2015-08-01 LAB — PROTIME-INR
INR: 1.17
Prothrombin Time: 15.1 seconds — ABNORMAL HIGH (ref 11.4–15.0)

## 2015-08-01 SURGERY — LEFT HEART CATH AND CORONARY ANGIOGRAPHY
Anesthesia: Moderate Sedation | Laterality: Left

## 2015-08-01 MED ORDER — IOPAMIDOL (ISOVUE-300) INJECTION 61%
INTRAVENOUS | Status: DC | PRN
  Administered 2015-08-01: 105 mL via INTRA_ARTERIAL

## 2015-08-01 MED ORDER — FENTANYL CITRATE (PF) 100 MCG/2ML IJ SOLN
INTRAMUSCULAR | Status: DC | PRN
  Administered 2015-08-01 (×2): 25 ug via INTRAVENOUS

## 2015-08-01 MED ORDER — MIDAZOLAM HCL 2 MG/2ML IJ SOLN
INTRAMUSCULAR | Status: DC | PRN
  Administered 2015-08-01 (×2): 1 mg via INTRAVENOUS

## 2015-08-01 MED ORDER — MIDAZOLAM HCL 2 MG/2ML IJ SOLN
INTRAMUSCULAR | Status: AC
  Filled 2015-08-01: qty 2

## 2015-08-01 MED ORDER — SODIUM CHLORIDE 0.9 % IV SOLN: INTRAVENOUS | Status: DC

## 2015-08-01 MED ORDER — FENTANYL CITRATE (PF) 100 MCG/2ML IJ SOLN
INTRAMUSCULAR | Status: AC
  Filled 2015-08-01: qty 2

## 2015-08-01 MED ORDER — SODIUM CHLORIDE 0.9% FLUSH: 3.0000 mL | INTRAVENOUS | Status: DC | PRN

## 2015-08-01 MED ORDER — ASPIRIN 81 MG PO CHEW: 81.0000 mg | CHEWABLE_TABLET | ORAL | Status: DC

## 2015-08-01 MED ORDER — HEPARIN (PORCINE) IN NACL 2-0.9 UNIT/ML-% IJ SOLN
INTRAMUSCULAR | Status: AC
  Filled 2015-08-01: qty 1000

## 2015-08-01 MED ORDER — SODIUM CHLORIDE 0.9% FLUSH: 3.0000 mL | Freq: Two times a day (BID) | INTRAVENOUS | Status: DC

## 2015-08-01 MED ORDER — SODIUM CHLORIDE 0.9 % IV SOLN
250.0000 mL | INTRAVENOUS | Status: DC | PRN
  Administered 2015-08-01 (×2): 1000 mL via INTRAVENOUS

## 2015-08-01 SURGICAL SUPPLY — 9 items
CATH INFINITI 5FR ANG PIGTAIL (CATHETERS) ×2 IMPLANT
CATH INFINITI 5FR JL4 (CATHETERS) ×2 IMPLANT
CATH INFINITI JR4 5F (CATHETERS) ×2 IMPLANT
KIT MANI 3VAL PERCEP (MISCELLANEOUS) ×2 IMPLANT
NDL PERC 18GX7CM (NEEDLE) ×1 IMPLANT
NEEDLE PERC 18GX7CM (NEEDLE) ×2 IMPLANT
PACK CARDIAC CATH (CUSTOM PROCEDURE TRAY) ×2 IMPLANT
SHEATH AVANTI 5FR X 11CM (SHEATH) ×2 IMPLANT
WIRE EMERALD 3MM-J .035X150CM (WIRE) ×2 IMPLANT

## 2015-08-01 NOTE — H&P (Signed)
Chief Complaint: Chief Complaint  Patient presents with  . 3 week follow up  having cramp like feeling in side of left chest  . Shortness of Breath  is better not having anymore  Date of Service: 07/24/2015 Date of Birth: 1950-06-09 PCP: Alan Mulder, MD  History of Present Illness: [name] is a 65 y.o.female patient who presents for follow-up visit after recently being hospitalized with heart failure. During that visit she was noted to have a moderate cardiomyopathy with ejection fraction of 20-25%. She also had atrial fibrillation noted during that visit. She was anticoagulated with warfarin and had rate control with diltiazem. She was placed on low-dose carvedilol and taken off of the indapamide. She was diuresed with furosemide. Etiology of her cardiomyopathy is unclear. She had significant hypertension and has been on clonidine for her blood pressure. Rate continues to be controlled with Lanoxin at 0.25 mg daily diltiazem. Will attempt to reduce her Cardizem and continue to treat with evidence-based beta-blocker afterload reduction and diuresis. Now returns for follow-up visit. Continues to have shortness of breath. Will need to evaluate possible etiology of her cardiomyopathy to include possible ischemic etiology. Risk and benefits of left heart cath were discussed with the patient and she agrees to proceed. She will need to come off warfarin 5 days prior to the procedure and metformin 24 hours prior to the procedure. She will remain off of metformin until renal function can be assessed post cardiac catheterization. Will resume warfarin when safe post procedure.  Past Medical and Surgical History  Past Medical History Past Medical History  Diagnosis Date  . Allergic state  . Atrial fibrillation , unspecified (CMS-HCC)  . Chronic systolic heart failure (CMS-HCC) 2017  . Essential hypertension, benign  . Psoriasis  . Type II or unspecified type diabetes mellitus without  mention of complication, not stated as uncontrolled (CMS-HCC)   Past Surgical History She has a past surgical history that includes cardiac catheterization (2010); Cholecystectomy (1986); Tubal ligation (1981); and Hysterectomy (1985).   Medications and Allergies  Current Medications  Current Outpatient Prescriptions  Medication Sig Dispense Refill  . aspirin 81 MG chewable tablet Take 81 mg by mouth once daily.  . cloNIDine HCl (CATAPRES) 0.2 MG tablet Take 1 tablet (0.2 mg total) by mouth 2 (two) times daily. 180 tablet 3  . digoxin (LANOXIN) 0.25 MG tablet Take 1 tablet (0.25 mg total) by mouth once daily. 30 tablet 11  . diltiazem (CARDIZEM CD) 120 MG XR capsule Take 1 capsule (120 mg total) by mouth once daily. 30 capsule 6  . FUROsemide (LASIX) 20 MG tablet Take 1 tablet (20 mg total) by mouth once daily. 30 tablet 11  . glimepiride (AMARYL) 2 MG tablet TAKE 1 TABLET BY MOUTH EVERY EVENING 90 tablet 1  . glimepiride (AMARYL) 4 MG tablet TAKE 1 TABLET BY MOUTH EVERY MORNING 90 tablet 0  . lisinopril (PRINIVIL,ZESTRIL) 10 MG tablet Take 1 tablet (10 mg total) by mouth once daily. 30 tablet 11  . metFORMIN (GLUCOPHAGE) 1000 MG tablet TAKE 1 TABLET BY MOUTH TWICE DAILY 180 tablet 1  . potassium chloride (K-DUR, KLOR-CON) 10 mEq ER tablet Take 1 tablet (10 mEq total) by mouth once daily. 30 tablet 11  . warfarin (COUMADIN) 2.5 MG tablet TAKE 1 TABLET BY MOUTH EVERY DAY AS DIRECTED 90 tablet 0  . warfarin (COUMADIN) 3 MG tablet TAKE 1 TABLET BY MOUTH ON SATURDAY AND SUNDAY EVERY WEEK 30 tablet 0  . carvedilol (COREG)  6.25 MG tablet Take 1 tablet (6.25 mg total) by mouth 2 (two) times daily with meals. 60 tablet 5   No current facility-administered medications for this visit.   Allergies: Amoxicillin-pot clavulanate; Cardura [doxazosin]; and Red dye  Social and Family History  Social History reports that she has never smoked. She has never used smokeless tobacco. She reports that she  does not drink alcohol.  Family History Family History  Problem Relation Age of Onset  . Diabetes type II Mother  . Coronary artery disease Mother  . Cancer Mother  . Stroke Father  . Coronary artery disease Father   Review of Systems  Review of Systems  Constitutional: Negative for chills, diaphoresis, fever, malaise/fatigue and weight loss.  HENT: Negative for congestion, ear discharge, hearing loss and tinnitus.  Eyes: Negative for blurred vision.  Respiratory: Positive for shortness of breath. Negative for cough, hemoptysis, sputum production and wheezing.  Cardiovascular: Positive for leg swelling. Negative for chest pain, palpitations, orthopnea, claudication and PND.  Gastrointestinal: Negative for abdominal pain, blood in stool, constipation, diarrhea, heartburn, melena, nausea and vomiting.  Genitourinary: Negative for dysuria, frequency, hematuria and urgency.  Musculoskeletal: Negative for back pain, falls, joint pain and myalgias.  Skin: Negative for itching and rash.  Neurological: Negative for dizziness, tingling, focal weakness, loss of consciousness, weakness and headaches.  Endo/Heme/Allergies: Negative for polydipsia. Does not bruise/bleed easily.  Psychiatric/Behavioral: Negative for depression, memory loss and substance abuse. The patient is not nervous/anxious.   Physical Examination   Vitals: Visit Vitals  . BP 120/72  . Pulse 76  . Resp 12  . Ht 165.1 cm (5\' 5" )  . Wt 78 kg (172 lb)  . BMI 28.62 kg/m2   Ht:165.1 cm (5\' 5" ) Wt:78 kg (172 lb) GDJ:MEQA surface area is 1.89 meters squared. Body mass index is 28.62 kg/(m^2).  Wt Readings from Last 3 Encounters:  07/24/15 78 kg (172 lb)  07/19/15 79.3 kg (174 lb 12.8 oz)  07/05/15 78.2 kg (172 lb 6.4 oz)   BP Readings from Last 3 Encounters:  07/24/15 120/72  07/19/15 124/86  07/05/15 142/80   General appearance appears in no acute distress  Head Mouth and Eye exam Normocephalic, without obvious  abnormality, atraumatic Dentition is good Eyes appear anicteric   Neck exam Thyroid: normal  Nodes: no obvious adenopathy  LUNGS Breath Sounds: Normal Percussion: Normal  CARDIOVASCULAR JVP CV wave: no HJR: no Elevation at 90 degrees: None Carotid Pulse: normal pulsation bilaterally Bruit: None Apex: apical impulse normal  Auscultation Rhythm: atrial fibrillation S1: normal S2: normal Clicks: no Rub: no Murmurs: no murmurs  Gallop: None ABDOMEN Liver enlargement: no Pulsatile aorta: no Ascites: no Bruits: no  EXTREMITIES Clubbing: no Edema: 1+ bilateral pedal edema Pulses: peripheral pulses symmetrical Femoral Bruits: no Amputation: no SKIN Rash: no Cyanosis: no Embolic phemonenon: no Bruising: no NEURO Alert and Oriented to person, place and time: yes Non focal: yes  PSYCH: Pt appears to have normal affect  LABS REVIEWED Last 3 CBC results: Lab Results  Component Value Date  WBC 10.5 (H) 07/24/2015  WBC 7.7 01/04/2015  WBC 7.7 08/14/2014   Lab Results  Component Value Date  HGB 14.3 07/24/2015   Lab Results  Component Value Date  HCT 42.6 07/24/2015  HCT 40.5 01/04/2015  HCT 38.5 08/14/2014   Lab Results  Component Value Date  PLT 346 07/24/2015  PLT 355 01/04/2015  PLT 339 08/14/2014   Lab Results  Component Value Date  CREATININE 1.1 07/05/2015  BUN 28 (H) 07/05/2015  NA 136 07/05/2015  K 5.0 07/05/2015  CL 98 07/05/2015  CO2 26.7 07/05/2015   Diagnostic Studies Reviewed:  Assessment and Plan   65 y.o. female with  ICD-10-CM ICD-9-CM  1. Persistent atrial fibrillation (CMS-HCC)-we will continue with Lanoxin at 0.25 mg daily and reduce diltiazem to 120 mg daily. Goal would be to discontinue diltiazem if possible given cardiomyopathy will continue with warfarin for anticoagulation with a INR goal of 2-3. Will hold warfarin 5 days prior to cardiac catheterization. I48.1 427.31   2. Essential (primary)  hypertension-continue with lisinopril fractional reduction, carvedilol 6.25 mg twice daily and diltiazem. DASH diet is recommended I10 401.9  3. Chronic systolic heart failure (CMS-HCC)-continue with lisinopril at 10 mg daily as well as furosemide for diuresis. Continue with carvedilol and diltiazem. Daily weights low-sodium diet is recommended we will proceed with left cardiac catheterization to evaluate coronary anatomy to guide further therapy regarding the possible etiology of her heart failure and systolic dysfunction. I50.22 428.22    4. Essential hypertension-as per above I10 401.9  5. Type 2 diabetes mellitus without complication, without long-term current use of insulin (CMS-HCC)-low-fat ADA diet as well as glimepiride and metformin. E11.9 250.00   Return in about 2 weeks (around 08/07/2015).  These notes generated with voice recognition software. I apologize for typographical errors.  Denton Ar, MD

## 2015-08-01 NOTE — Discharge Instructions (Signed)
Angiogram, Care After °Refer to this sheet in the next few weeks. These instructions provide you with information about caring for yourself after your procedure. Your health care provider may also give you more specific instructions. Your treatment has been planned according to current medical practices, but problems sometimes occur. Call your health care provider if you have any problems or questions after your procedure. °WHAT TO EXPECT AFTER THE PROCEDURE °After your procedure, it is typical to have the following: °· Bruising at the catheter insertion site that usually fades within 1-2 weeks. °· Blood collecting in the tissue (hematoma) that may be painful to the touch. It should usually decrease in size and tenderness within 1-2 weeks. °HOME CARE INSTRUCTIONS °· Take medicines only as directed by your health care provider. °· You may shower 24-48 hours after the procedure or as directed by your health care provider. Remove the bandage (dressing) and gently wash the site with plain soap and water. Pat the area dry with a clean towel. Do not rub the site, because this may cause bleeding. °· Do not take baths, swim, or use a hot tub until your health care provider approves. °· Check your insertion site every day for redness, swelling, or drainage. °· Do not apply powder or lotion to the site. °· Do not lift over 10 lb (4.5 kg) for 5 days after your procedure or as directed by your health care provider. °· Ask your health care provider when it is okay to: °¨ Return to work or school. °¨ Resume usual physical activities or sports. °¨ Resume sexual activity. °· Do not drive home if you are discharged the same day as the procedure. Have someone else drive you. °· You may drive 24 hours after the procedure unless otherwise instructed by your health care provider. °· Do not operate machinery or power tools for 24 hours after the procedure or as directed by your health care provider. °· If your procedure was done as an  outpatient procedure, which means that you went home the same day as your procedure, a responsible adult should be with you for the first 24 hours after you arrive home. °· Keep all follow-up visits as directed by your health care provider. This is important. °SEEK MEDICAL CARE IF: °· You have a fever. °· You have chills. °· You have increased bleeding from the catheter insertion site. Hold pressure on the site. °SEEK IMMEDIATE MEDICAL CARE IF: °· You have unusual pain at the catheter insertion site. °· You have redness, warmth, or swelling at the catheter insertion site. °· You have drainage (other than a small amount of blood on the dressing) from the catheter insertion site. °· The catheter insertion site is bleeding, and the bleeding does not stop after 30 minutes of holding steady pressure on the site. °· The area near or just beyond the catheter insertion site becomes pale, cool, tingly, or numb. °  °This information is not intended to replace advice given to you by your health care provider. Make sure you discuss any questions you have with your health care provider. °  °Document Released: 09/26/2004 Document Revised: 03/31/2014 Document Reviewed: 08/11/2012 °Elsevier Interactive Patient Education ©2016 [employer] ° °

## 2015-08-01 NOTE — Progress Notes (Signed)
Pt resting post cardiac cath, clinically stable, husband at bedside, Dr Lady Gary speaking with patient and husband, no bleeding nor hematoma at right groin, taking po's without difficulty

## 2015-09-20 ENCOUNTER — Other Ambulatory Visit: Payer: Self-pay | Admitting: Internal Medicine

## 2015-09-20 DIAGNOSIS — Z1231 Encounter for screening mammogram for malignant neoplasm of breast: Secondary | ICD-10-CM

## 2015-09-26 ENCOUNTER — Observation Stay: Admission: RE | Admit: 2015-09-26 | Payer: Medicare Other | Source: Ambulatory Visit | Admitting: Cardiology

## 2015-09-27 ENCOUNTER — Encounter: Admission: RE | Payer: Self-pay | Source: Ambulatory Visit

## 2015-09-27 ENCOUNTER — Ambulatory Visit: Admission: RE | Admit: 2015-09-27 | Payer: Medicare Other | Source: Ambulatory Visit | Admitting: Cardiology

## 2015-09-27 SURGERY — CARDIOVERSION (CATH LAB)
Anesthesia: General

## 2015-10-09 ENCOUNTER — Ambulatory Visit
Admission: RE | Admit: 2015-10-09 | Discharge: 2015-10-09 | Disposition: A | Discharge status: Home / Self Care | Payer: Medicare Other | Source: Ambulatory Visit | Attending: Internal Medicine | Admitting: Internal Medicine

## 2015-10-09 DIAGNOSIS — Z1231 Encounter for screening mammogram for malignant neoplasm of breast: Secondary | ICD-10-CM

## 2015-10-10 ENCOUNTER — Other Ambulatory Visit: Payer: Self-pay | Admitting: Internal Medicine

## 2015-10-10 DIAGNOSIS — N6459 Other signs and symptoms in breast: Secondary | ICD-10-CM

## 2015-10-10 DIAGNOSIS — N644 Mastodynia: Secondary | ICD-10-CM

## 2015-10-19 ENCOUNTER — Ambulatory Visit
Admission: RE | Admit: 2015-10-19 | Discharge: 2015-10-19 | Disposition: A | Discharge status: Home / Self Care | Payer: Medicare Other | Source: Ambulatory Visit | Attending: Internal Medicine | Admitting: Internal Medicine

## 2015-10-19 DIAGNOSIS — N6459 Other signs and symptoms in breast: Secondary | ICD-10-CM

## 2015-10-19 DIAGNOSIS — N644 Mastodynia: Secondary | ICD-10-CM

## 2016-09-26 ENCOUNTER — Other Ambulatory Visit: Payer: Self-pay | Admitting: Internal Medicine

## 2016-09-26 DIAGNOSIS — N6310 Unspecified lump in the right breast, unspecified quadrant: Principal | ICD-10-CM

## 2016-09-26 DIAGNOSIS — N6315 Unspecified lump in the right breast, overlapping quadrants: Secondary | ICD-10-CM

## 2016-10-08 ENCOUNTER — Other Ambulatory Visit: Payer: Self-pay | Admitting: Internal Medicine

## 2016-10-08 DIAGNOSIS — N6315 Unspecified lump in the right breast, overlapping quadrants: Secondary | ICD-10-CM

## 2016-10-08 DIAGNOSIS — N6310 Unspecified lump in the right breast, unspecified quadrant: Principal | ICD-10-CM

## 2016-10-20 ENCOUNTER — Ambulatory Visit
Admission: RE | Admit: 2016-10-20 | Discharge: 2016-10-20 | Disposition: A | Discharge status: Home / Self Care | Payer: Medicare Other | Source: Ambulatory Visit | Attending: Internal Medicine | Admitting: Internal Medicine

## 2016-10-20 DIAGNOSIS — N6312 Unspecified lump in the right breast, upper inner quadrant: Secondary | ICD-10-CM | POA: Insufficient documentation

## 2016-10-20 DIAGNOSIS — N6310 Unspecified lump in the right breast, unspecified quadrant: Secondary | ICD-10-CM | POA: Diagnosis present

## 2016-10-20 DIAGNOSIS — N6315 Unspecified lump in the right breast, overlapping quadrants: Secondary | ICD-10-CM

## 2017-06-10 ENCOUNTER — Encounter: Payer: Medicare Other | Attending: Internal Medicine | Admitting: *Deleted

## 2017-06-10 ENCOUNTER — Encounter: Payer: Self-pay | Admitting: *Deleted

## 2017-06-10 VITALS — BP 144/82 | Ht 61.0 in | Wt 155.7 lb

## 2017-06-10 DIAGNOSIS — E1165 Type 2 diabetes mellitus with hyperglycemia: Secondary | ICD-10-CM

## 2017-06-10 DIAGNOSIS — Z713 Dietary counseling and surveillance: Secondary | ICD-10-CM | POA: Diagnosis not present

## 2017-06-10 DIAGNOSIS — E119 Type 2 diabetes mellitus without complications: Secondary | ICD-10-CM | POA: Insufficient documentation

## 2017-06-10 NOTE — Patient Instructions (Signed)
Check blood sugars 2 x day before breakfast and before supper every day Bring blood sugar records to the next class  Exercise: Begin walking  for  10-15  minutes  3  days a week and gradually increase to 30 minutes 5 x week  Eat 3 meals day, 1-2  snacks a day Space meals 4-6 hours apart  Carry fast acting glucose and a snack at all times Rotate injection sites  Return for classes on:

## 2017-06-10 NOTE — Progress Notes (Signed)
Diabetes Self-Management Education  Visit Type: First/Initial  Appt. Start Time: 1025 Appt. End Time: 1140  06/10/2017  [name], identified by name and date of birth, is a 67 y.o. female with a diagnosis of Diabetes: Type 2.   ASSESSMENT  Blood pressure (!) 144/82, height 5\' 1"  (1.549 m), weight 155 lb 11.2 oz (70.6 kg). Body mass index is 29.42 kg/m.  Diabetes Self-Management Education - 06/10/17 1242      Visit Information   Visit Type  First/Initial      Initial Visit   Diabetes Type  Type 2    Are you currently following a meal plan?  Yes    What type of meal plan do you follow?  "more salads which interferes with INR; more veggies; cut out potatoes, pasta, rice, bread - try to eat gluten free"    Are you taking your medications as prescribed?  No hasn't started Atorvastatin due to concerns about raising blood sugars.    Date Diagnosed  8-10 years      Health Coping   How would you rate your overall health?  Good      Psychosocial Assessment   Patient Belief/Attitude about Diabetes  Other (comment) 'It sucks"    Self-care barriers  None    Self-management support  Doctor's office;Friends    Patient Concerns  Nutrition/Meal planning;Glycemic Control;Medication;Monitoring;Healthy Lifestyle    Special Needs  None    Preferred Learning Style  Auditory;Visual;Hands on    Learning Readiness  Change in progress    How often do you need to have someone help you when you read instructions, pamphlets, or other written materials from your doctor or pharmacy?  1 - Never    What is the last grade level you completed in school?  some college      Pre-Education Assessment   Patient understands the diabetes disease and treatment process.  Needs Review    Patient understands incorporating nutritional management into lifestyle.  Needs Instruction    Patient undertands incorporating physical activity into lifestyle.  Needs Instruction    Patient understands using medications  safely.  Needs Review    Patient understands monitoring blood glucose, interpreting and using results  Needs Review    Patient understands prevention, detection, and treatment of acute complications.  Needs Review    Patient understands prevention, detection, and treatment of chronic complications.  Needs Review    Patient understands how to develop strategies to address psychosocial issues.  Needs Instruction    Patient understands how to develop strategies to promote health/change behavior.  Needs Instruction      Complications   Last HgB A1C per patient/outside source  14.8 % 05/25/17    How often do you check your blood sugar?  1-2 times/day    Fasting Blood glucose range (mg/dL)  >295 Pt reports FBG's now less than 300 mg/dL - in 621'H mg/dL.    Have you had a dilated eye exam in the past 12 months?  Yes    Have you had a dental exam in the past 12 months?  Yes    Are you checking your feet?  Yes    How many days per week are you checking your feet?  7      Dietary Intake   Breakfast  Greek yogurt with strawberries; oatmeal; omelet; egg and grits    Snack (morning)  apple, peanut butter, strawberries, sugar free jello, non-starchy vegetables    Lunch  grilledchicken salad, sometimes just vegetables -  green beans, slaw, corn, pintos    Snack (afternoon)  same as morning    Dinner  chicken, fish, pork, stir fry, vegetable soup, beans, peas, corn, tomatoes, green peppers    Beverage(s)  water, unsweetened tea and coffee      Exercise   Exercise Type  [employer] just [employer]      Patient Education   Previous Diabetes Education  No    Disease state   Explored patient's options for treatment of their diabetes    Nutrition management   Role of diet in the treatment of diabetes and the relationship between the three main macronutrients and blood glucose level;Food label reading, portion sizes and measuring food.;Carbohydrate counting;Reviewed blood glucose goals for pre and post meals and  how to evaluate the patients' food intake on their blood glucose level.;Meal timing in regards to the patients' current diabetes medication.    Physical activity and exercise   Role of exercise on diabetes management, blood pressure control and cardiac health.    Medications  Taught/reviewed insulin injection, site rotation, insulin storage and needle disposal.;Reviewed patients medication for diabetes, action, purpose, timing of dose and side effects.    Monitoring  Purpose and frequency of SMBG.;Taught/discussed recording of test results and interpretation of SMBG.;Identified appropriate SMBG and/or A1C goals.    Acute complications  Taught treatment of hypoglycemia - the 15 rule.    Chronic complications  Relationship between chronic complications and blood glucose control    Psychosocial adjustment  Identified and addressed patients feelings and concerns about diabetes      Individualized Goals (developed by patient)   Reducing Risk  Improve blood sugars Decrease medications Prevent diabetes complications Lead a healthier lifestyle Become more fit     Outcomes   Expected Outcomes  Demonstrated interest in learning. Expect positive outcomes       Individualized Plan for Diabetes Self-Management Training:   Learning Objective:  Patient will have a greater understanding of diabetes self-management. Patient education plan is to attend individual and/or group sessions per assessed needs and concerns.   Plan:   Patient Instructions  Check blood sugars 2 x day before breakfast and before supper every day Bring blood sugar records to the next class Exercise: Begin walking  for  10-15  minutes  3  days a week and gradually increase to 30 minutes 5 x week Eat 3 meals day, 1-2  snacks a day Space meals 4-6 hours apart Carry fast acting glucose and a snack at all times Rotate injection sites  Expected Outcomes:  Demonstrated interest in learning. Expect positive outcomes  Education  material provided:  General Meal Planning Guidelines Simple Meal Plan Glucose tablets Symptoms, causes and treatments of Hypoglycemia  If problems or questions, patient to contact team via:  Sharion Settler, RN, CCM, CDE [phone]  Future DSME appointment:  June 18, 2017 for Diabetes Class 1

## 2017-06-18 ENCOUNTER — Encounter: Payer: Self-pay | Admitting: Dietician

## 2017-06-18 ENCOUNTER — Encounter: Payer: Medicare Other | Admitting: Dietician

## 2017-06-18 VITALS — Ht 61.0 in | Wt 156.5 lb

## 2017-06-18 DIAGNOSIS — E1165 Type 2 diabetes mellitus with hyperglycemia: Secondary | ICD-10-CM

## 2017-06-18 DIAGNOSIS — Z713 Dietary counseling and surveillance: Secondary | ICD-10-CM | POA: Diagnosis not present

## 2017-06-18 NOTE — Progress Notes (Signed)

## 2017-06-25 ENCOUNTER — Encounter: Payer: Medicare Other | Attending: Internal Medicine | Admitting: *Deleted

## 2017-06-25 ENCOUNTER — Encounter: Payer: Self-pay | Admitting: *Deleted

## 2017-06-25 VITALS — Wt 154.8 lb

## 2017-06-25 DIAGNOSIS — Z713 Dietary counseling and surveillance: Secondary | ICD-10-CM | POA: Insufficient documentation

## 2017-06-25 DIAGNOSIS — E119 Type 2 diabetes mellitus without complications: Secondary | ICD-10-CM | POA: Insufficient documentation

## 2017-06-25 DIAGNOSIS — E1165 Type 2 diabetes mellitus with hyperglycemia: Secondary | ICD-10-CM

## 2017-06-25 NOTE — Progress Notes (Signed)
Fax sent to Dr Marcello Fennel regarding patients elevated blood sugars.  Appt. Start Time: 0900 Appt. End Time: 1200  Class 2 Nutritional Management - identify sources of carbohydrate, protein and fat; plan balanced meals; estimate servings of carbohydrates in meals  Psychosocial - identify DM as a source of stress; state the effects of stress on BG control  Exercise - describe the effects of exercise on blood glucose and importance of regular exercise in controlling diabetes; state a plan for personal exercise; verbalize contraindications for exercise  Self-Monitoring - state importance of SMBG; use SMBG results to effectively manage diabetes; identify importance of regular HbA1C testing and goals for results  Acute Complications - recognize hyperglycemia and hypoglycemia with causes and effects; identify blood glucose results as high, low or in control; list steps in treating and preventing high and low blood glucose  Sick Day Guidelines: state appropriate measure to manage blood glucose when ill (need for meds, HBGM plan, when to call physician, need for fluids)  Chronic Complications/Foot, Skin, Eye Dental Care - identify possible long-term complications of diabetes (retinopathy, neuropathy, nephropathy, cardiovascular disease, infections); explain steps in prevention and treatment of chronic complications; state importance of daily self-foot exams; describe how to examine feet and what to look for; explain appropriate eye and dental care  Lifestyle Changes/Goals - state benefits of making appropriate lifestyle changes; identify habits that need to change (meals, tobacco, alcohol); identify strategies to reduce risk factors for personal health  Pregnancy/Sexual Health - state importance of good blood glucose control in preventing sexual problems (impotence, vaginal dryness, infections, loss of desire)  Teaching Materials Used: Class 2 Slide Packet A1C Pamphlet Foot Care Literature Kidney Test  Handout Stroke Card Quick and "Balanced" Meal Ideas Carb Counting and Meal Planning Book Goals for Class 2

## 2017-07-02 ENCOUNTER — Encounter: Payer: Self-pay | Admitting: Dietician

## 2017-07-02 ENCOUNTER — Encounter: Payer: Medicare Other | Admitting: Dietician

## 2017-07-02 VITALS — BP 136/82 | Ht 61.0 in | Wt 155.7 lb

## 2017-07-02 DIAGNOSIS — Z713 Dietary counseling and surveillance: Secondary | ICD-10-CM | POA: Diagnosis not present

## 2017-07-02 DIAGNOSIS — E1165 Type 2 diabetes mellitus with hyperglycemia: Secondary | ICD-10-CM

## 2017-07-02 NOTE — Progress Notes (Signed)

## 2017-07-06 ENCOUNTER — Encounter: Payer: Self-pay | Admitting: *Deleted

## 2017-08-25 ENCOUNTER — Encounter: Payer: Self-pay | Admitting: *Deleted

## 2017-08-26 ENCOUNTER — Encounter
Admission: RE | Disposition: A | Discharge status: Home / Self Care | Payer: Self-pay | Source: Ambulatory Visit | Attending: Internal Medicine

## 2017-08-26 ENCOUNTER — Encounter: Payer: Self-pay | Admitting: *Deleted

## 2017-08-26 ENCOUNTER — Ambulatory Visit
Admission: RE | Admit: 2017-08-26 | Discharge: 2017-08-26 | Disposition: A | Discharge status: Home / Self Care | Payer: Medicare Other | Source: Ambulatory Visit | Attending: Internal Medicine | Admitting: Internal Medicine

## 2017-08-26 ENCOUNTER — Ambulatory Visit: Payer: Medicare Other | Admitting: Anesthesiology

## 2017-08-26 DIAGNOSIS — K640 First degree hemorrhoids: Secondary | ICD-10-CM | POA: Diagnosis not present

## 2017-08-26 DIAGNOSIS — I4891 Unspecified atrial fibrillation: Secondary | ICD-10-CM | POA: Insufficient documentation

## 2017-08-26 DIAGNOSIS — I509 Heart failure, unspecified: Secondary | ICD-10-CM | POA: Insufficient documentation

## 2017-08-26 DIAGNOSIS — K3189 Other diseases of stomach and duodenum: Secondary | ICD-10-CM | POA: Insufficient documentation

## 2017-08-26 DIAGNOSIS — Z794 Long term (current) use of insulin: Secondary | ICD-10-CM | POA: Insufficient documentation

## 2017-08-26 DIAGNOSIS — I110 Hypertensive heart disease with heart failure: Secondary | ICD-10-CM | POA: Diagnosis not present

## 2017-08-26 DIAGNOSIS — M109 Gout, unspecified: Secondary | ICD-10-CM | POA: Insufficient documentation

## 2017-08-26 DIAGNOSIS — Z79899 Other long term (current) drug therapy: Secondary | ICD-10-CM | POA: Insufficient documentation

## 2017-08-26 DIAGNOSIS — E119 Type 2 diabetes mellitus without complications: Secondary | ICD-10-CM | POA: Insufficient documentation

## 2017-08-26 DIAGNOSIS — K635 Polyp of colon: Secondary | ICD-10-CM | POA: Insufficient documentation

## 2017-08-26 DIAGNOSIS — R195 Other fecal abnormalities: Secondary | ICD-10-CM | POA: Diagnosis present

## 2017-08-26 DIAGNOSIS — Z7901 Long term (current) use of anticoagulants: Secondary | ICD-10-CM | POA: Insufficient documentation

## 2017-08-26 DIAGNOSIS — K766 Portal hypertension: Secondary | ICD-10-CM | POA: Diagnosis not present

## 2017-08-26 HISTORY — DX: Allergy status to unspecified drugs, medicaments and biological substances: Z88.9

## 2017-08-26 HISTORY — PX: COLONOSCOPY WITH PROPOFOL: SHX5780

## 2017-08-26 HISTORY — DX: Cardiomyopathy, unspecified: I42.9

## 2017-08-26 HISTORY — DX: Psoriasis, unspecified: L40.9

## 2017-08-26 HISTORY — PX: ESOPHAGOGASTRODUODENOSCOPY (EGD) WITH PROPOFOL: SHX5813

## 2017-08-26 LAB — GLUCOSE, CAPILLARY: Glucose-Capillary: 219 mg/dL — ABNORMAL HIGH (ref 65–99)

## 2017-08-26 SURGERY — ESOPHAGOGASTRODUODENOSCOPY (EGD) WITH PROPOFOL
Anesthesia: General

## 2017-08-26 MED ORDER — PROPOFOL 500 MG/50ML IV EMUL
INTRAVENOUS | Status: DC | PRN
  Administered 2017-08-26: 80 ug/kg/min via INTRAVENOUS

## 2017-08-26 MED ORDER — PROPOFOL 500 MG/50ML IV EMUL
INTRAVENOUS | Status: AC
Start: 2017-08-26 — End: ?
  Filled 2017-08-26: qty 50

## 2017-08-26 MED ORDER — PROPOFOL 10 MG/ML IV BOLUS
INTRAVENOUS | Status: DC | PRN
  Administered 2017-08-26: 50 mg via INTRAVENOUS

## 2017-08-26 MED ORDER — SODIUM CHLORIDE 0.9 % IV SOLN
INTRAVENOUS | Status: DC
  Administered 2017-08-26: 11:00:00 via INTRAVENOUS

## 2017-08-26 NOTE — H&P (Signed)
Outpatient short stay form Pre-procedure 08/26/2017 11:12 AM Deborah Dawson K. Deborah Dawson, M.D.  Primary Physician: Barbette Reichmann, M.D.  Reason for visit:  Heme Positive stool  History of present illness:  Patient isa 67 y.o. Female presenting with hemoccult positive stool. Denies overt bleeding, abdominal pain or weight loss.     Current Facility-Administered Medications:  .  0.9 %  sodium chloride infusion, , Intravenous, Continuous, [place], Boykin Nearing, MD, Last Rate: 20 mL/hr at 08/26/17 1049  Medications Prior to Admission  Medication Sig Dispense Refill Last Dose  . allopurinol (ZYLOPRIM) 100 MG tablet Take 100 mg by mouth daily.   Past Week at Unknown time  . apixaban (ELIQUIS) 5 MG TABS tablet Take 5 mg by mouth 2 (two) times daily.     . Calcium Carb-Cholecalciferol (CALCIUM 500+D PO) Take 1 tablet by mouth daily.   Past Week at Unknown time  . carvedilol (COREG) 6.25 MG tablet Take 1 tablet (6.25 mg total) by mouth 2 (two) times daily with a meal. 60 tablet 0 08/26/2017 at 0700  . cloNIDine (CATAPRES) 0.2 MG tablet Take 0.2 mg by mouth 3 (three) times daily.   08/26/2017 at 0700  . colchicine 0.6 MG tablet Take 1 tablet by mouth 2 (two) times daily as needed.   Past Month at Unknown time  . digoxin (LANOXIN) 0.25 MG tablet Take 1 tablet (0.25 mg total) by mouth daily. 30 tablet 0 08/26/2017 at 0700  . diltiazem (CARTIA XT) 120 MG 24 hr capsule Take 120 mg by mouth daily.   08/26/2017 at 0700  . furosemide (LASIX) 20 MG tablet Take 10 mg by mouth daily.   Past Week at Unknown time  . glimepiride (AMARYL) 4 MG tablet Take 4 mg by mouth 2 (two) times daily.   Past Week at Unknown time  . HUMULIN 70/30 (70-30) 100 UNIT/ML injection Inject 36 Units into the skin 2 (two) times daily before a meal.   5 08/25/2017 at Unknown time  . Turmeric 500 MG CAPS Take 1 capsule by mouth daily.   Past Week at Unknown time  . Vitamin D, Ergocalciferol, 2000 units CAPS Take 1 capsule by mouth daily.   Past Week at  Unknown time  . atorvastatin (LIPITOR) 10 MG tablet Take 10 mg by mouth daily.   Not Taking at Unknown time  . warfarin (COUMADIN) 2 MG tablet Take 2 mg by mouth 2 (two) times a week.   Completed Course at Unknown time  . warfarin (COUMADIN) 3 MG tablet Take 3 mg by mouth daily.   Completed Course at Unknown time     Allergies  Allergen Reactions  . Doxazosin Other (See Comments)    Cardura - cough  . Gluten Meal Hives and Rash  . Hydralazine Hcl Other (See Comments)    gout  . Metformin And Related Swelling  . Augmentin [Amoxicillin-Pot Clavulanate] Diarrhea  . Dulaglutide Nausea Only  . Hctz [Hydrochlorothiazide] Other (See Comments)    Gout  . Losartan Diarrhea  . Poison Oak Extract Rash  . Red Dye Rash     Past Medical History:  Diagnosis Date  . Allergic genetic state   . Atrial fibrillation (HCC) 2010  . Cardiomyopathy (HCC)   . CHF (congestive heart failure) (HCC)   . Diabetes mellitus without complication (HCC)   . Gout   . H/O cardiac catheterization   . Hypertension   . Joint pain   . Osteopenia   . Psoriasis     Review of  systems:   Otherwise negative.    Physical Exam  Gen: Alert, oriented. Appears stated age.  HEENT: [place]/AT. PERRLA. Lungs: CTA, no wheezes. CV: RR nl S1, S2. Abd: soft, benign, no masses. BS+ Ext: No edema. Pulses 2+    Planned procedures: Proceed with colonoscopy. The patient understands the nature of the planned procedure, indications, risks, alternatives and potential complications including but not limited to bleeding, infection, perforation, damage to internal organs and possible oversedation/side effects from anesthesia. The patient agrees and gives consent to proceed.  Please refer to procedure notes for findings, recommendations and patient disposition/instructions.    Maximo Spratling K. Deborah Dawson, M.D. Gastroenterology 08/26/2017  11:12 AM

## 2017-08-26 NOTE — Transfer of Care (Signed)
Immediate Anesthesia Transfer of Care Note  Patient: Deborah Dawson  Procedure(s) Performed: ESOPHAGOGASTRODUODENOSCOPY (EGD) WITH PROPOFOL (N/A ) COLONOSCOPY WITH PROPOFOL (N/A )  Patient Location: PACU  Anesthesia Type:General  Level of Consciousness: awake  Airway & Oxygen Therapy: Patient Spontanous Breathing  Post-op Assessment: Report given to RN  Post vital signs: stable  Last Vitals:  Vitals Value Taken Time  BP 154/65 08/26/2017 11:53 AM  Temp 36.4 C 08/26/2017 11:52 AM  Pulse 40 08/26/2017 11:53 AM  Resp 18 08/26/2017 11:53 AM  SpO2 99 % 08/26/2017 11:53 AM  Vitals shown include unvalidated device data.  Last Pain:  Vitals:   08/26/17 1152  TempSrc: Tympanic  PainSc: 0-No pain         Complications: No apparent anesthesia complications

## 2017-08-26 NOTE — Op Note (Signed)
[facility] Patient Name: Deborah Dawson Procedure Date: 08/26/2017 11:14 AM MRN: [id number] Account #: [id number] Date of Birth: 08/19/1950 Admit Type: Outpatient Age: 67 Room: [facility] ENDO ROOM 3 Gender: Female Note Status: Finalized Procedure:            Colonoscopy Indications:          Heme positive stool Providers:            Boykin Nearing. [name] MD, MD Referring MD:         Barbette Reichmann, MD (Referring MD) Medicines:            Propofol per Anesthesia Complications:        No immediate complications. Procedure:            Pre-Anesthesia Assessment:                       - The risks and benefits of the procedure and the                        sedation options and risks were discussed with the                        patient. All questions were answered and informed                        consent was obtained.                       - Patient identification and proposed procedure were                        verified prior to the procedure by the nurse. The                        procedure was verified in the procedure room.                       - ASA Grade Assessment: III - A patient with severe                        systemic disease.                       - After reviewing the risks and benefits, the patient                        was deemed in satisfactory condition to undergo the                        procedure.                       After obtaining informed consent, the colonoscope was                        passed under direct vision. Throughout the procedure,                        the patient's blood pressure, pulse, and oxygen  saturations were monitored continuously. The                        Colonoscope was introduced through the anus and                        advanced to the the cecum, identified by appendiceal                        orifice and ileocecal valve. The colonoscopy was                        somewhat  difficult due to restricted mobility of the                        colon. Successful completion of the procedure was aided                        by applying abdominal pressure. The patient tolerated                        the procedure well. The quality of the bowel                        preparation was good. The ileocecal valve, appendiceal                        orifice, and rectum were photographed. Findings:      The perianal and digital rectal examinations were normal. Pertinent       negatives include normal sphincter tone and no palpable rectal lesions.      A 6 mm polyp was found in the sigmoid colon. The polyp was       semi-pedunculated. The polyp was removed with a cold snare. Resection       and retrieval were complete.      Non-bleeding internal hemorrhoids were found during retroflexion. The       hemorrhoids were Grade I (internal hemorrhoids that do not prolapse).      The exam was otherwise without abnormality. Impression:           - One 6 mm polyp in the sigmoid colon, removed with a                        cold snare. Resected and retrieved.                       - Non-bleeding internal hemorrhoids.                       - The examination was otherwise normal. Recommendation:       - Patient has a contact number available for                        emergencies. The signs and symptoms of potential                        delayed complications were discussed with the patient.                        Return to normal activities  tomorrow. Written discharge                        instructions were provided to the patient.                       - Resume previous diet.                       - Await pathology results.                       - Repeat colonoscopy for surveillance based on                        pathology results.                       - The findings and recommendations were discussed with                        the patient and their family.                        - Return to physician assistant in 8 weeks.                       - The findings and recommendations were discussed with                        the patient and their family. Procedure Code(s):    --- Professional ---                       [phone], Colonoscopy, flexible; with removal of tumor(s),                        polyp(s), or other lesion(s) by snare technique Diagnosis Code(s):    --- Professional ---                       D12.5, Benign neoplasm of sigmoid colon                       K64.0, First degree hemorrhoids                       R19.5, Other fecal abnormalities CPT copyright 2017 American Medical Association. All rights reserved. The codes documented in this report are preliminary and upon coder review may  be revised to meet current compliance requirements. Stanton Kidney MD, MD 08/26/2017 11:50:27 AM This report has been signed electronically. Number of Addenda: 0 Note Initiated On: 08/26/2017 11:14 AM Scope Withdrawal Time: 0 hours 6 minutes 30 seconds  Total Procedure Duration: 0 hours 11 minutes 14 seconds       [facility]

## 2017-08-26 NOTE — Anesthesia Post-op Follow-up Note (Signed)
Anesthesia QCDR form completed.        

## 2017-08-26 NOTE — Anesthesia Preprocedure Evaluation (Signed)
Anesthesia Evaluation  Patient identified by MRN, date of birth, ID band Patient awake    Reviewed: Allergy & Precautions, H&P , NPO status , Patient's Chart, lab work & pertinent test results, reviewed documented beta blocker date and time   History of Anesthesia Complications Negative for: history of anesthetic complications  Airway Mallampati: I  TM Distance: >3 FB Neck ROM: full    Dental  (+) Dental Advidsory Given, Caps, Chipped, Missing, Teeth Intact Permanent bridge on right upper:   Pulmonary neg pulmonary ROS,           Cardiovascular Exercise Tolerance: Good hypertension, (-) angina+CHF  (-) CAD, (-) Past MI, (-) Cardiac Stents and (-) CABG + dysrhythmias Atrial Fibrillation (-) Valvular Problems/Murmurs     Neuro/Psych negative neurological ROS  negative psych ROS   GI/Hepatic negative GI ROS, Neg liver ROS,   Endo/Other  diabetes  Renal/GU negative Renal ROS  negative genitourinary   Musculoskeletal   Abdominal   Peds  Hematology negative hematology ROS (+)   Anesthesia Other Findings Past Medical History: No date: Allergic genetic state 2010: Atrial fibrillation (HCC) No date: Cardiomyopathy (HCC) No date: CHF (congestive heart failure) (HCC) No date: Diabetes mellitus without complication (HCC) No date: Gout No date: H/O cardiac catheterization No date: Hypertension No date: Joint pain No date: Osteopenia No date: Psoriasis   Reproductive/Obstetrics negative OB ROS                             Anesthesia Physical Anesthesia Plan  ASA: III  Anesthesia Plan: General   Post-op Pain Management:    Induction: Intravenous  PONV Risk Score and Plan: 3 and Propofol infusion  Airway Management Planned: Nasal Cannula  Additional Equipment:   Intra-op Plan:   Post-operative Plan:   Informed Consent: I have reviewed the patients History and Physical, chart, labs  and discussed the procedure including the risks, benefits and alternatives for the proposed anesthesia with the patient or authorized representative who has indicated his/her understanding and acceptance.   Dental Advisory Given  Plan Discussed with: Anesthesiologist, CRNA and Surgeon  Anesthesia Plan Comments:         Anesthesia Quick Evaluation

## 2017-08-26 NOTE — Interval H&P Note (Signed)
History and Physical Interval Note:  08/26/2017 11:15 AM  Deborah Dawson  has presented today for surgery, with the diagnosis of HEME + STOOLS  The various methods of treatment have been discussed with the patient and family. After consideration of risks, benefits and other options for treatment, the patient has consented to  Procedure(s): ESOPHAGOGASTRODUODENOSCOPY (EGD) WITH PROPOFOL (N/A) COLONOSCOPY WITH PROPOFOL (N/A) as a surgical intervention .  The patient's history has been reviewed, patient examined, no change in status, stable for surgery.  I have reviewed the patient's chart and labs.  Questions were answered to the patient's satisfaction.     [place], [place]

## 2017-08-26 NOTE — Op Note (Signed)
[facility] Patient Name: Deborah Dawson Procedure Date: 08/26/2017 11:15 AM MRN: [id number] Account #: [id number] Date of Birth: Apr 22, 1950 Admit Type: Outpatient Age: 67 Room: [facility] ENDO ROOM 3 Gender: Female Note Status: Finalized Procedure:            Upper GI endoscopy Indications:          hemoccult Providers:            Boykin Nearing. [name] MD, MD Referring MD:         Barbette Reichmann, MD (Referring MD) Medicines:            Propofol per Anesthesia Complications:        No immediate complications. Procedure:            Pre-Anesthesia Assessment:                       - The risks and benefits of the procedure and the                        sedation options and risks were discussed with the                        patient. All questions were answered and informed                        consent was obtained.                       - Patient identification and proposed procedure were                        verified prior to the procedure by the nurse. The                        procedure was verified in the procedure room.                       - ASA Grade Assessment: II - A patient with mild                        systemic disease.                       - ASA Grade Assessment: III - A patient with severe                        systemic disease.                       - ASA Grade Assessment:                       - After reviewing the risks and benefits, the patient                        was deemed in satisfactory condition to undergo the                        procedure.                       After obtaining informed consent, the endoscope  was                        passed under direct vision. Throughout the procedure,                        the patient's blood pressure, pulse, and oxygen                        saturations were monitored continuously. The                        Colonoscope was introduced through the mouth, and   advanced to the third part of duodenum. The upper GI                        endoscopy was accomplished without difficulty. The                        patient tolerated the procedure well. Findings:      Prominent esophageal veins present. No clear evidence of esophageal       varices.      Mild portal hypertensive gastropathy was found in the entire examined       stomach.      The examined duodenum was normal. Impression:           - Portal hypertensive gastropathy.                       - Normal examined duodenum.                       - No specimens collected. Recommendation:       - Proceed with colonoscopy Procedure Code(s):    --- Professional ---                       [phone], Esophagogastroduodenoscopy, flexible, transoral;                        diagnostic, including collection of specimen(s) by                        brushing or washing, when performed (separate procedure) Diagnosis Code(s):    --- Professional ---                       K31.89, Other diseases of stomach and duodenum                       K76.6, Portal hypertension CPT copyright 2017 American Medical Association. All rights reserved. The codes documented in this report are preliminary and upon coder review may  be revised to meet current compliance requirements. Stanton Kidney MD, MD 08/26/2017 11:34:33 AM This report has been signed electronically. Number of Addenda: 0 Note Initiated On: 08/26/2017 11:15 AM      [facility]

## 2017-08-26 NOTE — Anesthesia Postprocedure Evaluation (Signed)
Anesthesia Post Note  Patient: Deborah Dawson  Procedure(s) Performed: ESOPHAGOGASTRODUODENOSCOPY (EGD) WITH PROPOFOL (N/A ) COLONOSCOPY WITH PROPOFOL (N/A )  Patient location during evaluation: Endoscopy Anesthesia Type: General Level of consciousness: awake and alert Pain management: pain level controlled Vital Signs Assessment: post-procedure vital signs reviewed and stable Respiratory status: spontaneous breathing, nonlabored ventilation, respiratory function stable and patient connected to nasal cannula oxygen Cardiovascular status: blood pressure returned to baseline and stable Postop Assessment: no apparent nausea or vomiting Anesthetic complications: no     Last Vitals:  Vitals:   08/26/17 1202 08/26/17 1212  BP: (!) 145/78 (!) 169/89  Pulse: 60 (!) 49  Resp: 17 15  Temp:    SpO2: 100% 100%    Last Pain:  Vitals:   08/26/17 1212  TempSrc:   PainSc: 0-No pain                 Lenard Simmer

## 2017-08-27 ENCOUNTER — Encounter: Payer: Self-pay | Admitting: Internal Medicine

## 2017-08-27 LAB — SURGICAL PATHOLOGY

## 2017-11-27 ENCOUNTER — Other Ambulatory Visit: Payer: Self-pay | Admitting: Internal Medicine

## 2017-11-27 DIAGNOSIS — Z1231 Encounter for screening mammogram for malignant neoplasm of breast: Secondary | ICD-10-CM

## 2017-12-21 ENCOUNTER — Ambulatory Visit
Admission: RE | Admit: 2017-12-21 | Discharge: 2017-12-21 | Disposition: A | Discharge status: Home / Self Care | Payer: Medicare Other | Source: Ambulatory Visit | Attending: Internal Medicine | Admitting: Internal Medicine

## 2017-12-21 DIAGNOSIS — Z1231 Encounter for screening mammogram for malignant neoplasm of breast: Secondary | ICD-10-CM | POA: Diagnosis not present

## 2018-12-29 ENCOUNTER — Other Ambulatory Visit: Payer: Self-pay | Admitting: Internal Medicine

## 2018-12-29 DIAGNOSIS — N649 Disorder of breast, unspecified: Secondary | ICD-10-CM

## 2019-01-06 ENCOUNTER — Ambulatory Visit
Admission: RE | Admit: 2019-01-06 | Discharge: 2019-01-06 | Disposition: A | Discharge status: Home / Self Care | Payer: Medicare Other | Source: Ambulatory Visit | Attending: Internal Medicine | Admitting: Internal Medicine

## 2019-01-06 DIAGNOSIS — N649 Disorder of breast, unspecified: Secondary | ICD-10-CM

## 2019-03-10 ENCOUNTER — Other Ambulatory Visit: Payer: Self-pay | Admitting: Student

## 2019-03-10 DIAGNOSIS — K3189 Other diseases of stomach and duodenum: Secondary | ICD-10-CM

## 2019-03-10 DIAGNOSIS — K766 Portal hypertension: Secondary | ICD-10-CM

## 2019-03-16 ENCOUNTER — Other Ambulatory Visit: Payer: Self-pay

## 2019-03-16 ENCOUNTER — Ambulatory Visit
Admission: RE | Admit: 2019-03-16 | Discharge: 2019-03-16 | Disposition: A | Discharge status: Home / Self Care | Payer: Medicare Other | Source: Ambulatory Visit | Attending: Student | Admitting: Student

## 2019-03-16 DIAGNOSIS — K766 Portal hypertension: Secondary | ICD-10-CM | POA: Insufficient documentation

## 2019-03-16 DIAGNOSIS — K3189 Other diseases of stomach and duodenum: Secondary | ICD-10-CM | POA: Insufficient documentation

## 2019-06-30 ENCOUNTER — Other Ambulatory Visit
Admission: RE | Admit: 2019-06-30 | Discharge: 2019-06-30 | Disposition: A | Discharge status: Home / Self Care | Payer: Medicare Other | Source: Ambulatory Visit | Attending: Internal Medicine | Admitting: Internal Medicine

## 2019-06-30 DIAGNOSIS — Z20822 Contact with and (suspected) exposure to covid-19: Secondary | ICD-10-CM | POA: Diagnosis not present

## 2019-06-30 DIAGNOSIS — Z01812 Encounter for preprocedural laboratory examination: Secondary | ICD-10-CM | POA: Diagnosis present

## 2019-06-30 LAB — SARS CORONAVIRUS 2 (TAT 6-24 HRS): SARS Coronavirus 2: NEGATIVE

## 2019-07-01 ENCOUNTER — Encounter: Payer: Self-pay | Admitting: Internal Medicine

## 2019-07-04 ENCOUNTER — Encounter: Payer: Self-pay | Admitting: *Deleted

## 2019-07-04 ENCOUNTER — Ambulatory Visit
Admission: RE | Admit: 2019-07-04 | Discharge: 2019-07-04 | Disposition: A | Discharge status: Home / Self Care | Payer: Medicare Other | Attending: Internal Medicine | Admitting: Internal Medicine

## 2019-07-04 ENCOUNTER — Ambulatory Visit: Payer: Medicare Other | Admitting: Anesthesiology

## 2019-07-04 ENCOUNTER — Other Ambulatory Visit: Payer: Self-pay

## 2019-07-04 ENCOUNTER — Encounter
Admission: RE | Disposition: A | Discharge status: Home / Self Care | Payer: Self-pay | Source: Home / Self Care | Attending: Internal Medicine

## 2019-07-04 DIAGNOSIS — Z13810 Encounter for screening for upper gastrointestinal disorder: Secondary | ICD-10-CM | POA: Insufficient documentation

## 2019-07-04 DIAGNOSIS — Z7901 Long term (current) use of anticoagulants: Secondary | ICD-10-CM | POA: Diagnosis not present

## 2019-07-04 DIAGNOSIS — K766 Portal hypertension: Secondary | ICD-10-CM | POA: Insufficient documentation

## 2019-07-04 DIAGNOSIS — Z882 Allergy status to sulfonamides status: Secondary | ICD-10-CM | POA: Diagnosis not present

## 2019-07-04 DIAGNOSIS — Z794 Long term (current) use of insulin: Secondary | ICD-10-CM | POA: Insufficient documentation

## 2019-07-04 DIAGNOSIS — I429 Cardiomyopathy, unspecified: Secondary | ICD-10-CM | POA: Diagnosis not present

## 2019-07-04 DIAGNOSIS — Z881 Allergy status to other antibiotic agents status: Secondary | ICD-10-CM | POA: Insufficient documentation

## 2019-07-04 DIAGNOSIS — I4891 Unspecified atrial fibrillation: Secondary | ICD-10-CM | POA: Insufficient documentation

## 2019-07-04 DIAGNOSIS — I509 Heart failure, unspecified: Secondary | ICD-10-CM | POA: Diagnosis not present

## 2019-07-04 DIAGNOSIS — M109 Gout, unspecified: Secondary | ICD-10-CM | POA: Diagnosis not present

## 2019-07-04 DIAGNOSIS — Z880 Allergy status to penicillin: Secondary | ICD-10-CM | POA: Diagnosis not present

## 2019-07-04 DIAGNOSIS — K449 Diaphragmatic hernia without obstruction or gangrene: Secondary | ICD-10-CM | POA: Insufficient documentation

## 2019-07-04 DIAGNOSIS — Z79899 Other long term (current) drug therapy: Secondary | ICD-10-CM | POA: Insufficient documentation

## 2019-07-04 DIAGNOSIS — I110 Hypertensive heart disease with heart failure: Secondary | ICD-10-CM | POA: Diagnosis not present

## 2019-07-04 DIAGNOSIS — K2950 Unspecified chronic gastritis without bleeding: Secondary | ICD-10-CM | POA: Insufficient documentation

## 2019-07-04 DIAGNOSIS — M8580 Other specified disorders of bone density and structure, unspecified site: Secondary | ICD-10-CM | POA: Insufficient documentation

## 2019-07-04 DIAGNOSIS — E119 Type 2 diabetes mellitus without complications: Secondary | ICD-10-CM | POA: Diagnosis not present

## 2019-07-04 DIAGNOSIS — K3189 Other diseases of stomach and duodenum: Secondary | ICD-10-CM | POA: Diagnosis not present

## 2019-07-04 HISTORY — DX: Cardiac arrhythmia, unspecified: I49.9

## 2019-07-04 HISTORY — PX: ESOPHAGOGASTRODUODENOSCOPY (EGD) WITH PROPOFOL: SHX5813

## 2019-07-04 LAB — GLUCOSE, CAPILLARY: Glucose-Capillary: 361 mg/dL — ABNORMAL HIGH (ref 70–99)

## 2019-07-04 SURGERY — ESOPHAGOGASTRODUODENOSCOPY (EGD) WITH PROPOFOL
Anesthesia: General

## 2019-07-04 MED ORDER — SODIUM CHLORIDE 0.9 % IV SOLN
INTRAVENOUS | Status: DC
  Administered 2019-07-04: 1000 mL via INTRAVENOUS

## 2019-07-04 MED ORDER — PROPOFOL 10 MG/ML IV BOLUS
INTRAVENOUS | Status: DC | PRN
  Administered 2019-07-04: 10 mg via INTRAVENOUS
  Administered 2019-07-04: 90 mg via INTRAVENOUS

## 2019-07-04 MED ORDER — LIDOCAINE HCL (CARDIAC) PF 100 MG/5ML IV SOSY
PREFILLED_SYRINGE | INTRAVENOUS | Status: DC | PRN
  Administered 2019-07-04: 50 mg via INTRAVENOUS

## 2019-07-04 MED ORDER — PROPOFOL 10 MG/ML IV BOLUS
INTRAVENOUS | Status: AC
  Filled 2019-07-04: qty 20

## 2019-07-04 NOTE — H&P (Signed)
Outpatient short stay form Pre-procedure 07/04/2019 9:16 AM Deborah Dawson Deborah Dawson, M.D.  Primary Physician: Barbette Reichmann, M.D.  Reason for visit: Portal venous hypertension, presumptive cirrhosis.  History of present illness:  Patient presents for EGD for above issues. Denies abdominal pain, dysphagia, GERD symptoms presently. No melena.    Current Facility-Administered Medications:  .  0.9 %  sodium chloride infusion, , Intravenous, Continuous, [place], Boykin Nearing, MD, Last Rate: 20 mL/hr at 07/04/19 0912, 1,000 mL at 07/04/19 0912  Medications Prior to Admission  Medication Sig Dispense Refill Last Dose  . carvedilol (COREG) 6.25 MG tablet Take 1 tablet (6.25 mg total) by mouth 2 (two) times daily with a meal. 60 tablet 0 07/04/2019 at 0800  . cloNIDine (CATAPRES) 0.2 MG tablet Take 0.2 mg by mouth 3 (three) times daily.   07/04/2019 at 0800  . colchicine 0.6 MG tablet Take 1 tablet by mouth 2 (two) times daily as needed.   Past Week at Unknown time  . digoxin (LANOXIN) 0.25 MG tablet Take 1 tablet (0.25 mg total) by mouth daily. 30 tablet 0 07/04/2019 at 0800  . diltiazem (CARTIA XT) 120 MG 24 hr capsule Take 120 mg by mouth daily.   07/04/2019 at 0800  . furosemide (LASIX) 20 MG tablet Take 10 mg by mouth daily.   07/03/2019 at Unknown time  . glimepiride (AMARYL) 4 MG tablet Take 4 mg by mouth 2 (two) times daily.   Past Week at Unknown time  . Turmeric 500 MG CAPS Take 1 capsule by mouth daily.   Past Week at Unknown time  . allopurinol (ZYLOPRIM) 100 MG tablet Take 100 mg by mouth daily.     Marland Kitchen apixaban (ELIQUIS) 5 MG TABS tablet Take 5 mg by mouth 2 (two) times daily.     Marland Kitchen atorvastatin (LIPITOR) 10 MG tablet Take 10 mg by mouth daily.     . Calcium Carb-Cholecalciferol (CALCIUM 500+D PO) Take 1 tablet by mouth daily.     Marland Kitchen HUMULIN 70/30 (70-30) 100 UNIT/ML injection Inject 36 Units into the skin 2 (two) times daily before a meal.   5   . Vitamin D, Ergocalciferol, 2000 units CAPS Take 1  capsule by mouth daily.     Marland Kitchen warfarin (COUMADIN) 2 MG tablet Take 2 mg by mouth 2 (two) times a week.     . warfarin (COUMADIN) 3 MG tablet Take 3 mg by mouth daily.        Allergies  Allergen Reactions  . Doxazosin Other (See Comments)    Cardura - cough  . Doxycycline     Abdominal pain  . Drug Class [Clindamycin/Lincomycin] Hives  . Gluten Meal Hives and Rash  . Hydralazine Hcl Other (See Comments)    gout  . Metformin And Related Swelling  . Augmentin [Amoxicillin-Pot Clavulanate] Diarrhea  . Dulaglutide Nausea Only  . Hctz [Hydrochlorothiazide] Other (See Comments)    Gout  . Losartan Diarrhea  . Poison Oak Extract Rash  . Red Dye Rash     Past Medical History:  Diagnosis Date  . Allergic genetic state   . Atrial fibrillation (HCC) 2010  . Cardiomyopathy (HCC)   . CHF (congestive heart failure) (HCC)   . Diabetes mellitus without complication (HCC)   . Dysrhythmia   . Gout   . H/O cardiac catheterization   . Hypertension   . Joint pain   . Osteopenia   . Psoriasis     Review of systems:  Otherwise negative.  Physical Exam  Gen: Alert, oriented. Appears stated age.  HEENT: [place]/AT. PERRLA. Lungs: CTA, no wheezes. CV: RR nl S1, S2. Abd: soft, benign, no masses. BS+ Ext: No edema. Pulses 2+    Planned procedures: Proceed with EGD. The patient understands the nature of the planned procedure, indications, risks, alternatives and potential complications including but not limited to bleeding, infection, perforation, damage to internal organs and possible oversedation/side effects from anesthesia. The patient agrees and gives consent to proceed.  Please refer to procedure notes for findings, recommendations and patient disposition/instructions.     Deborah Dawson K. Deborah Dawson, M.D. Gastroenterology 07/04/2019  9:16 AM

## 2019-07-04 NOTE — Interval H&P Note (Signed)
History and Physical Interval Note:  07/04/2019 9:22 AM  Deborah Dawson [name]  has presented today for surgery, with the diagnosis of PORTAL HYPERTENSION GASTROPATHY.  The various methods of treatment have been discussed with the patient and family. After consideration of risks, benefits and other options for treatment, the patient has consented to  Procedure(s): ESOPHAGOGASTRODUODENOSCOPY (EGD) WITH PROPOFOL (N/A) as a surgical intervention.  The patient's history has been reviewed, patient examined, no change in status, stable for surgery.  I have reviewed the patient's chart and labs.  Questions were answered to the patient's satisfaction.     [place], [place]

## 2019-07-04 NOTE — Anesthesia Preprocedure Evaluation (Signed)
Anesthesia Evaluation  Patient identified by MRN, date of birth, ID band Patient awake    Reviewed: Allergy & Precautions, H&P , NPO status , Patient's Chart, lab work & pertinent test results, reviewed documented beta blocker date and time   Airway Mallampati: II   Neck ROM: full    Dental  (+) Poor Dentition   Pulmonary neg pulmonary ROS,    Pulmonary exam normal        Cardiovascular Exercise Tolerance: Poor hypertension, On Medications +CHF  Normal cardiovascular exam+ dysrhythmias  Rhythm:regular Rate:Normal     Neuro/Psych negative neurological ROS  negative psych ROS   GI/Hepatic negative GI ROS, Neg liver ROS,   Endo/Other  negative endocrine ROSdiabetes, Poorly Controlled, Type 2, Oral Hypoglycemic Agents  Renal/GU negative Renal ROS  negative genitourinary   Musculoskeletal   Abdominal   Peds  Hematology negative hematology ROS (+)   Anesthesia Other Findings Past Medical History: No date: Allergic genetic state 2010: Atrial fibrillation (HCC) No date: Cardiomyopathy (HCC) No date: CHF (congestive heart failure) (HCC) No date: Diabetes mellitus without complication (HCC) No date: Dysrhythmia No date: Gout No date: H/O cardiac catheterization No date: Hypertension No date: Joint pain No date: Osteopenia No date: Psoriasis Past Surgical History: 1985: ABDOMINAL HYSTERECTOMY No date: BREAST EXCISIONAL BIOPSY; Left     Comment:  negative years ago 1997: BREAST SURGERY; Left     Comment:  papilloma 08/01/2015: CARDIAC CATHETERIZATION; Left     Comment:  Procedure: Left Heart Cath and Coronary Angiography;                Surgeon: Dalia Heading, MD;  Location: [facility] INVASIVE CV               LAB;  Service: Cardiovascular;  Laterality: Left; 1986: CHOLECYSTECTOMY 08/26/2017: COLONOSCOPY WITH PROPOFOL; N/A     Comment:  Procedure: COLONOSCOPY WITH PROPOFOL;  Surgeon: Toledo,               Boykin Nearing,  MD;  Location: [facility] ENDOSCOPY;  Service:               Gastroenterology;  Laterality: N/A; 08/26/2017: ESOPHAGOGASTRODUODENOSCOPY (EGD) WITH PROPOFOL; N/A     Comment:  Procedure: ESOPHAGOGASTRODUODENOSCOPY (EGD) WITH               PROPOFOL;  Surgeon: Toledo, Boykin Nearing, MD;  Location:               [facility] ENDOSCOPY;  Service: Gastroenterology;  Laterality:               N/A; 1981: TUBAL LIGATION BMI    Body Mass Index: 25.89 kg/m     Reproductive/Obstetrics negative OB ROS                             Anesthesia Physical Anesthesia Plan  ASA: III  Anesthesia Plan: General   Post-op Pain Management:    Induction:   PONV Risk Score and Plan:   Airway Management Planned:   Additional Equipment:   Intra-op Plan:   Post-operative Plan:   Informed Consent: I have reviewed the patients History and Physical, chart, labs and discussed the procedure including the risks, benefits and alternatives for the proposed anesthesia with the patient or authorized representative who has indicated his/her understanding and acceptance.     Dental Advisory Given  Plan Discussed with: CRNA  Anesthesia Plan Comments:  Anesthesia Quick Evaluation  

## 2019-07-04 NOTE — Progress Notes (Signed)
Dr. Pernell Dupre notified that patient is unable to have insulin.

## 2019-07-04 NOTE — Anesthesia Postprocedure Evaluation (Signed)
Anesthesia Post Note  Patient: Deborah Dawson  Procedure(s) Performed: ESOPHAGOGASTRODUODENOSCOPY (EGD) WITH PROPOFOL (N/A )  Patient location during evaluation: PACU Anesthesia Type: General Level of consciousness: awake and alert Pain management: pain level controlled Vital Signs Assessment: post-procedure vital signs reviewed and stable Respiratory status: spontaneous breathing, nonlabored ventilation, respiratory function stable and patient connected to nasal cannula oxygen Cardiovascular status: blood pressure returned to baseline and stable Postop Assessment: no apparent nausea or vomiting Anesthetic complications: no     Last Vitals:  Vitals:   07/04/19 0857 07/04/19 0936  BP: (!) 167/84 (!) 127/46  Pulse: 62   Resp: 20 (!) 98  Temp: 36.9 C (!) 36.1 C  SpO2: 97%     Last Pain:  Vitals:   07/04/19 0946  TempSrc:   PainSc: 0-No pain                 Yevette Edwards

## 2019-07-04 NOTE — Transfer of Care (Signed)
Immediate Anesthesia Transfer of Care Note  Patient: Deborah Dawson  Procedure(s) Performed: ESOPHAGOGASTRODUODENOSCOPY (EGD) WITH PROPOFOL (N/A )  Patient Location: Endoscopy Unit  Anesthesia Type:General  Level of Consciousness: drowsy and patient cooperative  Airway & Oxygen Therapy: Patient Spontanous Breathing  Post-op Assessment: Report given to RN and Post -op Vital signs reviewed and stable  Post vital signs: Reviewed and stable  Last Vitals:  Vitals Value Taken Time  BP 127/46 07/04/19 0937  Temp 36.1 C 07/04/19 0936  Pulse 58 07/04/19 0939  Resp 17 07/04/19 0939  SpO2 96 % 07/04/19 0939  Vitals shown include unvalidated device data.  Last Pain:  Vitals:   07/04/19 0936  TempSrc: Temporal  PainSc:          Complications: No apparent anesthesia complications

## 2019-07-04 NOTE — Progress Notes (Signed)
Fingerstick 361.  Patient states this is actually low for her.  Dr. Pernell Dupre notified and Novalog Insulin 7 units SQ ordered.  Patient states she has been unable to tolerate insulin because it causes severe muscle knotting; that is why diabetes being managed with po's.

## 2019-07-04 NOTE — Op Note (Signed)
[facility] Patient Name: Deborah Dawson Procedure Date: 07/04/2019 9:22 AM MRN: [id number] Account #: [id number] Date of Birth: 25-Oct-1950 Admit Type: Outpatient Age: 69 Room: [facility] ENDO ROOM 3 Gender: Female Note Status: Finalized Procedure:             Upper GI endoscopy Indications:           Variceal screening (no known varices or prior                         bleeding), Follow-up of portal hypertensive gastropathy Providers:             Boykin Nearing. [name] MD, MD Referring MD:          Barbette Reichmann, MD (Referring MD) Medicines:             Propofol per Anesthesia Complications:         No immediate complications. Procedure:             Pre-Anesthesia Assessment:                        - The risks and benefits of the procedure and the                         sedation options and risks were discussed with the                         patient. All questions were answered and informed                         consent was obtained.                        - Patient identification and proposed procedure were                         verified prior to the procedure by the nurse. The                         procedure was verified in the procedure room.                        - ASA Grade Assessment: III - A patient with severe                         systemic disease.                        - After reviewing the risks and benefits, the patient                         was deemed in satisfactory condition to undergo the                         procedure.                        After obtaining informed consent, the endoscope was                         passed under  direct vision. Throughout the procedure,                         the patient's blood pressure, pulse, and oxygen                         saturations were monitored continuously. The Endoscope                         was introduced through the mouth, and advanced to the                         third  part of duodenum. The upper GI endoscopy was                         accomplished without difficulty. The patient tolerated                         the procedure well. Findings:      Prominent esophageal veins present. No clear evidence of esophageal       varices.      A 1 cm hiatal hernia was present.      Moderate portal hypertensive gastropathy was found in the entire       examined stomach. Biopsies were taken with a cold forceps for histology.      There is no endoscopic evidence of varices in the cardia and in the       gastric fundus.      The exam was otherwise without abnormality.      The examined duodenum was normal. Impression:            - 1 cm hiatal hernia.                        - Portal hypertensive gastropathy. Biopsied.                        - The examination was otherwise normal. Recommendation:        - Patient has a contact number available for                         emergencies. The signs and symptoms of potential                         delayed complications were discussed with the patient.                         Return to normal activities tomorrow. Written                         discharge instructions were provided to the patient.                        - Resume previous diet.                        - Continue present medications.                        - Await pathology results.                        -  Repeat upper endoscopy in 2 years for surveillance.                        - Return to GI office as previously scheduled.                        - The findings and recommendations were discussed with                         the patient. Procedure Code(s):     --- Professional ---                        [phone], Esophagogastroduodenoscopy, flexible,                         transoral; with biopsy, single or multiple Diagnosis Code(s):     --- Professional ---                        Z13.810, Encounter for screening for upper                          gastrointestinal disorder                        K31.89, Other diseases of stomach and duodenum                        K76.6, Portal hypertension                        K44.9, Diaphragmatic hernia without obstruction or                         gangrene CPT copyright 2019 [employer]. All rights reserved. The codes documented in this report are preliminary and upon coder review may  be revised to meet current compliance requirements. Stanton Kidney MD, MD 07/04/2019 9:38:21 AM This report has been signed electronically. Number of Addenda: 0 Note Initiated On: 07/04/2019 9:22 AM Estimated Blood Loss:  Estimated blood loss: none. Estimated blood loss: none.      [facility]

## 2019-07-07 LAB — SURGICAL PATHOLOGY

## 2019-11-13 ENCOUNTER — Other Ambulatory Visit: Payer: Self-pay

## 2019-11-13 ENCOUNTER — Emergency Department
Admission: EM | Admit: 2019-11-13 | Discharge: 2019-11-13 | Disposition: A | Discharge status: Home / Self Care | Payer: Medicare Other | Attending: Emergency Medicine | Admitting: Emergency Medicine

## 2019-11-13 DIAGNOSIS — Z5321 Procedure and treatment not carried out due to patient leaving prior to being seen by health care provider: Secondary | ICD-10-CM | POA: Insufficient documentation

## 2019-11-13 DIAGNOSIS — R030 Elevated blood-pressure reading, without diagnosis of hypertension: Secondary | ICD-10-CM | POA: Diagnosis present

## 2019-11-13 NOTE — ED Triage Notes (Signed)
Pt presents via POV c/o hypertension at home. BO in triage 152/88. Denies pain

## 2019-12-22 ENCOUNTER — Other Ambulatory Visit: Payer: Self-pay | Admitting: Internal Medicine

## 2019-12-22 DIAGNOSIS — Z1231 Encounter for screening mammogram for malignant neoplasm of breast: Secondary | ICD-10-CM

## 2020-01-12 ENCOUNTER — Other Ambulatory Visit: Payer: Self-pay

## 2020-01-12 ENCOUNTER — Ambulatory Visit
Admission: RE | Admit: 2020-01-12 | Discharge: 2020-01-12 | Disposition: A | Discharge status: Home / Self Care | Payer: Medicare Other | Source: Ambulatory Visit | Attending: Internal Medicine | Admitting: Internal Medicine

## 2020-01-12 DIAGNOSIS — Z1231 Encounter for screening mammogram for malignant neoplasm of breast: Secondary | ICD-10-CM | POA: Diagnosis not present

## 2020-03-28 ENCOUNTER — Encounter: Payer: Self-pay | Admitting: Urology

## 2020-03-28 ENCOUNTER — Other Ambulatory Visit: Payer: Self-pay

## 2020-03-28 ENCOUNTER — Ambulatory Visit: Payer: Medicare Other | Admitting: Urology

## 2020-03-28 VITALS — BP 176/95 | HR 85 | Ht 61.0 in | Wt 127.0 lb

## 2020-03-28 DIAGNOSIS — R3129 Other microscopic hematuria: Secondary | ICD-10-CM | POA: Diagnosis not present

## 2020-03-28 NOTE — Patient Instructions (Addendum)
Cystoscopy Cystoscopy is a procedure that is used to help diagnose and sometimes treat conditions that affect the lower urinary tract. The lower urinary tract includes the bladder and the urethra. The urethra is the tube that drains urine from the bladder. Cystoscopy is done using a thin, tube-shaped instrument with a light and camera at the end (cystoscope). The cystoscope may be hard or flexible, depending on the goal of the procedure. The cystoscope is inserted through the urethra, into the bladder. Cystoscopy may be recommended if you have:  Urinary tract infections that keep coming back.  Blood in the urine (hematuria).  An inability to control when you urinate (urinary incontinence) or an overactive bladder.  Unusual cells found in a urine sample.  A blockage in the urethra, such as a urinary stone.  Painful urination.  An abnormality in the bladder found during an intravenous pyelogram (IVP) or CT scan. Cystoscopy may also be done to remove a sample of tissue to be examined under a microscope (biopsy). What are the risks? Generally, this is a safe procedure. However, problems may occur, including:  Infection.  Bleeding.  What happens during the procedure?  1. You will be given one or more of the following: ? A medicine to numb the area (local anesthetic). 2. The area around the opening of your urethra will be cleaned. 3. The cystoscope will be passed through your urethra into your bladder. 4. Germ-free (sterile) fluid will flow through the cystoscope to fill your bladder. The fluid will stretch your bladder so that your health care provider can clearly examine your bladder walls. 5. Your doctor will look at the urethra and bladder. 6. The cystoscope will be removed The procedure may vary among health care providers  What can I expect after the procedure? After the procedure, it is common to have: 1. Some soreness or pain in your abdomen and urethra. 2. Urinary symptoms.  These include: ? Mild pain or burning when you urinate. Pain should stop within a few minutes after you urinate. This may last for up to 1 week. ? A small amount of blood in your urine for several days. ? Feeling like you need to urinate but producing only a small amount of urine. Follow these instructions at home: General instructions  Return to your normal activities as told by your health care provider.   Do not drive for 24 hours if you were given a sedative during your procedure.  Watch for any blood in your urine. If the amount of blood in your urine increases, call your health care provider.  If a tissue sample was removed for testing (biopsy) during your procedure, it is up to you to get your test results. Ask your health care provider, or the department that is doing the test, when your results will be ready.  Drink enough fluid to keep your urine pale yellow.  Keep all follow-up visits as told by your health care provider. This is important. Contact a health care provider if you:  Have pain that gets worse or does not get better with medicine, especially pain when you urinate.  Have trouble urinating.  Have more blood in your urine. Get help right away if you:  Have blood clots in your urine.  Have abdominal pain.  Have a fever or chills.  Are unable to urinate. Summary  Cystoscopy is a procedure that is used to help diagnose and sometimes treat conditions that affect the lower urinary tract.  Cystoscopy is done using   a thin, tube-shaped instrument with a light and camera at the end.  After the procedure, it is common to have some soreness or pain in your abdomen and urethra.  Watch for any blood in your urine. If the amount of blood in your urine increases, call your health care provider.  If you were prescribed an antibiotic medicine, take it as told by your health care provider. Do not stop taking the antibiotic even if you start to feel better. This  information is not intended to replace advice given to you by your health care provider. Make sure you discuss any questions you have with your health care provider. Document Revised: 03/02/2018 Document Reviewed: 03/02/2018 Elsevier Patient Education  2020 [employer]   

## 2020-03-28 NOTE — Progress Notes (Signed)
03/28/20 11:00 AM   Deborah Dawson Deborah Dawson Dec 18, 1950 [id number]  CC: Asymptomatic microscopic hematuria  HPI: I saw Deborah Dawson in urology clinic for asymptomatic microscopic hematuria.  She is a 70 year old female with a number of other medical issues including atrial fibrillation on Coumadin, cardiomyopathy, CHF, CAD, and diabetes.  She denies any gross hematuria, but has a history of 4 urinalyses with 4-10 RBCs and no other evidence of infection over the last year.  She denies any recent recurrent UTIs.  Denies any history of kidney stones.  She is a never smoker and denies any other carcinogenic exposures.  No recent prior imaging to review.  Renal function is normal.   PMH: Past Medical History:  Diagnosis Date  . Allergic genetic state   . Atrial fibrillation (HCC) 2010  . Cardiomyopathy (HCC)   . CHF (congestive heart failure) (HCC)   . Diabetes mellitus without complication (HCC)   . Dysrhythmia   . Gout   . H/O cardiac catheterization   . Hypertension   . Joint pain   . Osteopenia   . Psoriasis     Surgical History: Past Surgical History:  Procedure Laterality Date  . ABDOMINAL HYSTERECTOMY  1985  . BREAST EXCISIONAL BIOPSY Left    negative years ago  . BREAST SURGERY Left 1997   papilloma  . CARDIAC CATHETERIZATION Left 08/01/2015   Procedure: Left Heart Cath and Coronary Angiography;  Surgeon: Dalia Heading, MD;  Location: [facility] INVASIVE CV LAB;  Service: Cardiovascular;  Laterality: Left;  . CHOLECYSTECTOMY  1986  . COLONOSCOPY WITH PROPOFOL N/A 08/26/2017   Procedure: COLONOSCOPY WITH PROPOFOL;  Surgeon: Toledo, Boykin Nearing, MD;  Location: [facility] ENDOSCOPY;  Service: Gastroenterology;  Laterality: N/A;  . ESOPHAGOGASTRODUODENOSCOPY (EGD) WITH PROPOFOL N/A 08/26/2017   Procedure: ESOPHAGOGASTRODUODENOSCOPY (EGD) WITH PROPOFOL;  Surgeon: Toledo, Boykin Nearing, MD;  Location: [facility] ENDOSCOPY;  Service: Gastroenterology;  Laterality: N/A;  . ESOPHAGOGASTRODUODENOSCOPY (EGD) WITH  PROPOFOL N/A 07/04/2019   Procedure: ESOPHAGOGASTRODUODENOSCOPY (EGD) WITH PROPOFOL;  Surgeon: Toledo, Boykin Nearing, MD;  Location: [facility] ENDOSCOPY;  Service: Gastroenterology;  Laterality: N/A;  . TUBAL LIGATION  1981    Family History: Family History  Problem Relation Age of Onset  . Cancer Mother 6       breast  . Diabetes Mother   . Breast cancer Mother 9  . Coronary artery disease Mother   . Stroke Father   . Coronary artery disease Father   . Diabetes Son     Social History:  reports that she has never smoked. She has never used smokeless tobacco. She reports that she does not drink alcohol and does not use drugs.  Physical Exam: BP (!) 176/95 (BP Location: Left Arm, Patient Position: Sitting, Cuff Size: Normal)   Pulse 85   Ht 5\' 1"  (1.549 m)   Wt 127 lb (57.6 kg)   BMI 24.00 kg/m    Constitutional:  Alert and oriented, No acute distress. Cardiovascular: No clubbing, cyanosis, or edema. Respiratory: Normal respiratory effort, no increased work of breathing. GI: Abdomen is soft, nontender, nondistended, no abdominal masses   Assessment & Plan:   70 year old female with 4 episodes of asymptomatic microscopic hematuria over the last year with 4-10 RBCs and no evidence of infection.  We discussed common possible etiologies of microscopic hematuria including idiopathic, urolithiasis, medical renal disease, and malignancy. We discussed the new asymptomatic microscopic hematuria guidelines and risk categories of low, intermediate, and high risk that are based on age, risk factors like smoking,  and degree of microscopic hematuria. We discussed work-up can range from repeat urinalysis, renal ultrasound and cystoscopy, to CT urogram and cystoscopy.  They fall into the high risk category based on age, and I recommended proceeding with CT urogram and cystoscopy.   Legrand Rams, MD 03/28/2020  [facility] [address] [place], [place]  [address] [phone]

## 2020-03-29 LAB — URINALYSIS, COMPLETE
Bilirubin, UA: NEGATIVE
Ketones, UA: NEGATIVE
Leukocytes,UA: NEGATIVE
Nitrite, UA: NEGATIVE
Specific Gravity, UA: 1.02 (ref 1.005–1.030)
Urobilinogen, Ur: 0.2 mg/dL (ref 0.2–1.0)
pH, UA: 6 (ref 5.0–7.5)

## 2020-03-29 LAB — MICROSCOPIC EXAMINATION: Bacteria, UA: NONE SEEN

## 2020-04-03 ENCOUNTER — Ambulatory Visit: Payer: Medicare Other

## 2020-04-05 ENCOUNTER — Ambulatory Visit: Payer: Medicare Other

## 2020-04-09 ENCOUNTER — Other Ambulatory Visit: Payer: Medicare Other | Admitting: Urology

## 2020-04-12 ENCOUNTER — Other Ambulatory Visit: Payer: Self-pay | Admitting: Urology

## 2020-04-18 ENCOUNTER — Ambulatory Visit
Admission: RE | Admit: 2020-04-18 | Discharge: 2020-04-18 | Disposition: A | Discharge status: Home / Self Care | Payer: Medicare Other | Source: Ambulatory Visit | Attending: Urology | Admitting: Urology

## 2020-04-18 ENCOUNTER — Other Ambulatory Visit: Payer: Self-pay

## 2020-04-18 DIAGNOSIS — R3129 Other microscopic hematuria: Secondary | ICD-10-CM | POA: Insufficient documentation

## 2020-04-18 LAB — POCT I-STAT CREATININE: Creatinine, Ser: 0.8 mg/dL (ref 0.44–1.00)

## 2020-04-18 MED ORDER — IOHEXOL 300 MG/ML  SOLN
125.0000 mL | Freq: Once | INTRAMUSCULAR | Status: AC | PRN
  Administered 2020-04-18: 125 mL via INTRAVENOUS

## 2020-04-19 ENCOUNTER — Encounter: Payer: Self-pay | Admitting: Urology

## 2020-04-19 ENCOUNTER — Ambulatory Visit (INDEPENDENT_AMBULATORY_CARE_PROVIDER_SITE_OTHER): Payer: Medicare Other | Admitting: Urology

## 2020-04-19 VITALS — BP 162/83 | HR 81 | Ht 61.0 in | Wt 127.0 lb

## 2020-04-19 DIAGNOSIS — R3129 Other microscopic hematuria: Secondary | ICD-10-CM

## 2020-04-19 LAB — URINALYSIS, COMPLETE
Bilirubin, UA: NEGATIVE
Ketones, UA: NEGATIVE
Nitrite, UA: NEGATIVE
Specific Gravity, UA: 1.015 (ref 1.005–1.030)
Urobilinogen, Ur: 0.2 mg/dL (ref 0.2–1.0)
pH, UA: 5 (ref 5.0–7.5)

## 2020-04-19 LAB — MICROSCOPIC EXAMINATION

## 2020-04-19 NOTE — Progress Notes (Signed)
Cystoscopy Procedure Note:  Indication: Microscopic hematuria  After informed consent and discussion of the procedure and its risks, Deborah Dawson was positioned and prepped in the standard fashion. Cystoscopy was performed with a flexible cystoscope. The urethra, bladder neck and entire bladder was visualized in a standard fashion. The bladder mucosa was grossly normal throughout.  No abnormalities on retroflexion.  The ureteral orifices were visualized in their normal location and orientation.  Imaging: CT urogram with no abnormal findings  Findings: Normal cystoscopy  Assessment and Plan: Follow-up as needed  Legrand Rams, MD 04/19/2020

## 2020-05-08 ENCOUNTER — Other Ambulatory Visit: Payer: Self-pay | Admitting: Gastroenterology

## 2020-05-08 DIAGNOSIS — R1013 Epigastric pain: Secondary | ICD-10-CM

## 2020-05-08 DIAGNOSIS — K760 Fatty (change of) liver, not elsewhere classified: Secondary | ICD-10-CM

## 2020-05-15 ENCOUNTER — Ambulatory Visit
Admission: RE | Admit: 2020-05-15 | Discharge: 2020-05-15 | Disposition: A | Discharge status: Home / Self Care | Payer: Medicare Other | Source: Ambulatory Visit | Attending: Gastroenterology | Admitting: Gastroenterology

## 2020-05-15 ENCOUNTER — Other Ambulatory Visit: Payer: Self-pay

## 2020-05-15 DIAGNOSIS — R1013 Epigastric pain: Secondary | ICD-10-CM | POA: Diagnosis present

## 2020-05-15 DIAGNOSIS — K760 Fatty (change of) liver, not elsewhere classified: Secondary | ICD-10-CM | POA: Insufficient documentation

## 2020-08-08 ENCOUNTER — Emergency Department: Payer: Medicare Other

## 2020-08-08 ENCOUNTER — Inpatient Hospital Stay
Admission: EM | Admit: 2020-08-08 | Discharge: 2020-08-12 | DRG: 177 | Disposition: A | Discharge status: Home / Self Care | Payer: Medicare Other | Attending: Internal Medicine | Admitting: Internal Medicine

## 2020-08-08 ENCOUNTER — Other Ambulatory Visit: Payer: Self-pay

## 2020-08-08 DIAGNOSIS — E440 Moderate protein-calorie malnutrition: Secondary | ICD-10-CM | POA: Diagnosis present

## 2020-08-08 DIAGNOSIS — U071 COVID-19: Secondary | ICD-10-CM | POA: Diagnosis present

## 2020-08-08 DIAGNOSIS — J1282 Pneumonia due to coronavirus disease 2019: Secondary | ICD-10-CM | POA: Diagnosis present

## 2020-08-08 DIAGNOSIS — Z7984 Long term (current) use of oral hypoglycemic drugs: Secondary | ICD-10-CM | POA: Diagnosis not present

## 2020-08-08 DIAGNOSIS — Z6821 Body mass index (BMI) 21.0-21.9, adult: Secondary | ICD-10-CM

## 2020-08-08 DIAGNOSIS — Z9851 Tubal ligation status: Secondary | ICD-10-CM | POA: Diagnosis not present

## 2020-08-08 DIAGNOSIS — Z833 Family history of diabetes mellitus: Secondary | ICD-10-CM

## 2020-08-08 DIAGNOSIS — I429 Cardiomyopathy, unspecified: Secondary | ICD-10-CM | POA: Diagnosis present

## 2020-08-08 DIAGNOSIS — A4189 Other specified sepsis: Secondary | ICD-10-CM | POA: Diagnosis present

## 2020-08-08 DIAGNOSIS — I110 Hypertensive heart disease with heart failure: Secondary | ICD-10-CM | POA: Diagnosis present

## 2020-08-08 DIAGNOSIS — L409 Psoriasis, unspecified: Secondary | ICD-10-CM | POA: Diagnosis present

## 2020-08-08 DIAGNOSIS — Z91018 Allergy to other foods: Secondary | ICD-10-CM | POA: Diagnosis not present

## 2020-08-08 DIAGNOSIS — Z79899 Other long term (current) drug therapy: Secondary | ICD-10-CM

## 2020-08-08 DIAGNOSIS — M8580 Other specified disorders of bone density and structure, unspecified site: Secondary | ICD-10-CM | POA: Diagnosis present

## 2020-08-08 DIAGNOSIS — Z881 Allergy status to other antibiotic agents status: Secondary | ICD-10-CM

## 2020-08-08 DIAGNOSIS — Z7901 Long term (current) use of anticoagulants: Secondary | ICD-10-CM | POA: Diagnosis not present

## 2020-08-08 DIAGNOSIS — I5022 Chronic systolic (congestive) heart failure: Secondary | ICD-10-CM | POA: Diagnosis present

## 2020-08-08 DIAGNOSIS — I10 Essential (primary) hypertension: Secondary | ICD-10-CM | POA: Diagnosis present

## 2020-08-08 DIAGNOSIS — Z90710 Acquired absence of both cervix and uterus: Secondary | ICD-10-CM

## 2020-08-08 DIAGNOSIS — Z9109 Other allergy status, other than to drugs and biological substances: Secondary | ICD-10-CM

## 2020-08-08 DIAGNOSIS — Z888 Allergy status to other drugs, medicaments and biological substances status: Secondary | ICD-10-CM

## 2020-08-08 DIAGNOSIS — I482 Chronic atrial fibrillation, unspecified: Secondary | ICD-10-CM | POA: Diagnosis present

## 2020-08-08 DIAGNOSIS — E1165 Type 2 diabetes mellitus with hyperglycemia: Secondary | ICD-10-CM | POA: Diagnosis present

## 2020-08-08 DIAGNOSIS — J9601 Acute respiratory failure with hypoxia: Secondary | ICD-10-CM | POA: Diagnosis present

## 2020-08-08 DIAGNOSIS — Z794 Long term (current) use of insulin: Secondary | ICD-10-CM | POA: Diagnosis not present

## 2020-08-08 DIAGNOSIS — M109 Gout, unspecified: Secondary | ICD-10-CM | POA: Diagnosis present

## 2020-08-08 DIAGNOSIS — Z9102 Food additives allergy status: Secondary | ICD-10-CM | POA: Diagnosis not present

## 2020-08-08 DIAGNOSIS — Z9049 Acquired absence of other specified parts of digestive tract: Secondary | ICD-10-CM

## 2020-08-08 DIAGNOSIS — Z8249 Family history of ischemic heart disease and other diseases of the circulatory system: Secondary | ICD-10-CM

## 2020-08-08 DIAGNOSIS — J189 Pneumonia, unspecified organism: Secondary | ICD-10-CM

## 2020-08-08 DIAGNOSIS — I4891 Unspecified atrial fibrillation: Secondary | ICD-10-CM | POA: Diagnosis present

## 2020-08-08 LAB — CBC
HCT: 45.7 % (ref 36.0–46.0)
Hemoglobin: 15.6 g/dL — ABNORMAL HIGH (ref 12.0–15.0)
MCH: 27.6 pg (ref 26.0–34.0)
MCHC: 34.1 g/dL (ref 30.0–36.0)
MCV: 80.7 fL (ref 80.0–100.0)
Platelets: 467 10*3/uL — ABNORMAL HIGH (ref 150–400)
RBC: 5.66 MIL/uL — ABNORMAL HIGH (ref 3.87–5.11)
RDW: 13.1 % (ref 11.5–15.5)
WBC: 21.2 10*3/uL — ABNORMAL HIGH (ref 4.0–10.5)
nRBC: 0 % (ref 0.0–0.2)

## 2020-08-08 LAB — BASIC METABOLIC PANEL
Anion gap: 12 (ref 5–15)
BUN: 26 mg/dL — ABNORMAL HIGH (ref 8–23)
CO2: 27 mmol/L (ref 22–32)
Calcium: 9.3 mg/dL (ref 8.9–10.3)
Chloride: 94 mmol/L — ABNORMAL LOW (ref 98–111)
Creatinine, Ser: 0.75 mg/dL (ref 0.44–1.00)
GFR, Estimated: >60 mL/min (ref >60)
Glucose, Bld: 338 mg/dL — ABNORMAL HIGH (ref 70–99)
Potassium: 4.4 mmol/L (ref 3.5–5.1)
Sodium: 133 mmol/L — ABNORMAL LOW (ref 135–145)

## 2020-08-08 LAB — FERRITIN: Ferritin: 262 ng/mL (ref 11–307)

## 2020-08-08 LAB — TRIGLYCERIDES: Triglycerides: 154 mg/dL — ABNORMAL HIGH (ref <150)

## 2020-08-08 LAB — GLUCOSE, CAPILLARY
Glucose-Capillary: 309 mg/dL — ABNORMAL HIGH (ref 70–99)
Glucose-Capillary: 334 mg/dL — ABNORMAL HIGH (ref 70–99)

## 2020-08-08 LAB — FIBRINOGEN: Fibrinogen: 643 mg/dL — ABNORMAL HIGH (ref 210–475)

## 2020-08-08 LAB — LACTATE DEHYDROGENASE: LDH: 149 U/L (ref 98–192)

## 2020-08-08 LAB — PROCALCITONIN: Procalcitonin: <0.1 ng/mL

## 2020-08-08 LAB — CBG MONITORING, ED
Glucose-Capillary: 436 mg/dL — ABNORMAL HIGH (ref 70–99)
Glucose-Capillary: 418 mg/dL — ABNORMAL HIGH (ref 70–99)

## 2020-08-08 LAB — TROPONIN I (HIGH SENSITIVITY)
Troponin I (High Sensitivity): 32 ng/L — ABNORMAL HIGH (ref <18)
Troponin I (High Sensitivity): 30 ng/L — ABNORMAL HIGH (ref <18)

## 2020-08-08 LAB — PROTIME-INR
INR: 5.2 (ref 0.8–1.2)
Prothrombin Time: 48.4 seconds — ABNORMAL HIGH (ref 11.4–15.2)

## 2020-08-08 LAB — C-REACTIVE PROTEIN: CRP: 12.5 mg/dL — ABNORMAL HIGH (ref <1.0)

## 2020-08-08 LAB — LACTIC ACID, PLASMA: Lactic Acid, Venous: 1.6 mmol/L (ref 0.5–1.9)

## 2020-08-08 MED ORDER — ALBUTEROL SULFATE (2.5 MG/3ML) 0.083% IN NEBU
2.5000 mg | INHALATION_SOLUTION | Freq: Once | RESPIRATORY_TRACT | Status: AC
  Administered 2020-08-08: 2.5 mg via RESPIRATORY_TRACT
  Filled 2020-08-08: qty 3

## 2020-08-08 MED ORDER — METHYLPREDNISOLONE SODIUM SUCC 125 MG IJ SOLR
125.0000 mg | Freq: Once | INTRAMUSCULAR | Status: AC
  Administered 2020-08-08: 125 mg via INTRAVENOUS
  Filled 2020-08-08: qty 2

## 2020-08-08 MED ORDER — SODIUM CHLORIDE 0.9 % IV SOLN
200.0000 mg | Freq: Once | INTRAVENOUS | Status: AC
  Administered 2020-08-08: 200 mg via INTRAVENOUS
  Filled 2020-08-08: qty 40

## 2020-08-08 MED ORDER — HYDROCOD POLST-CPM POLST ER 10-8 MG/5ML PO SUER
5.0000 mL | Freq: Two times a day (BID) | ORAL | Status: DC | PRN
  Administered 2020-08-10: 5 mL via ORAL
  Filled 2020-08-08: qty 5

## 2020-08-08 MED ORDER — INSULIN ASPART 100 UNIT/ML IJ SOLN
3.0000 [IU] | Freq: Three times a day (TID) | INTRAMUSCULAR | Status: DC
  Administered 2020-08-08 – 2020-08-10 (×6): 3 [IU] via SUBCUTANEOUS
  Filled 2020-08-08 (×5): qty 1

## 2020-08-08 MED ORDER — DEXAMETHASONE 4 MG PO TABS
6.0000 mg | ORAL_TABLET | ORAL | Status: DC
Start: 2020-08-08 — End: 2020-08-12
  Administered 2020-08-08 – 2020-08-11 (×4): 6 mg via ORAL
  Filled 2020-08-08 (×3): qty 2
  Filled 2020-08-08: qty 1

## 2020-08-08 MED ORDER — SODIUM CHLORIDE 0.9 % IV SOLN: 200.0000 mg | Freq: Once | INTRAVENOUS | Status: DC

## 2020-08-08 MED ORDER — ALBUTEROL SULFATE HFA 108 (90 BASE) MCG/ACT IN AERS
2.0000 | INHALATION_SPRAY | Freq: Four times a day (QID) | RESPIRATORY_TRACT | Status: DC
  Administered 2020-08-08 – 2020-08-12 (×15): 2 via RESPIRATORY_TRACT
  Filled 2020-08-08: qty 6.7

## 2020-08-08 MED ORDER — ACETAMINOPHEN 325 MG PO TABS
650.0000 mg | ORAL_TABLET | Freq: Four times a day (QID) | ORAL | Status: DC | PRN
  Administered 2020-08-09: 650 mg via ORAL
  Filled 2020-08-08: qty 2

## 2020-08-08 MED ORDER — INSULIN ASPART 100 UNIT/ML IJ SOLN: 10.0000 [IU] | Freq: Once | INTRAMUSCULAR | Status: DC

## 2020-08-08 MED ORDER — SODIUM CHLORIDE 0.9 % IV SOLN
100.0000 mg | Freq: Every day | INTRAVENOUS | Status: AC
  Administered 2020-08-09 – 2020-08-12 (×4): 100 mg via INTRAVENOUS
  Filled 2020-08-08: qty 100
  Filled 2020-08-08: qty 20
  Filled 2020-08-08 (×2): qty 100
  Filled 2020-08-08: qty 20

## 2020-08-08 MED ORDER — GUAIFENESIN-DM 100-10 MG/5ML PO SYRP
10.0000 mL | ORAL_SOLUTION | ORAL | Status: DC | PRN
  Administered 2020-08-09 – 2020-08-11 (×3): 10 mL via ORAL
  Filled 2020-08-08 (×3): qty 10

## 2020-08-08 MED ORDER — BISACODYL 5 MG PO TBEC: 5.0000 mg | DELAYED_RELEASE_TABLET | Freq: Every day | ORAL | Status: DC | PRN

## 2020-08-08 MED ORDER — INSULIN ASPART 100 UNIT/ML IJ SOLN
0.0000 [IU] | Freq: Three times a day (TID) | INTRAMUSCULAR | Status: DC
  Administered 2020-08-08 – 2020-08-09 (×2): 15 [IU] via SUBCUTANEOUS
  Administered 2020-08-09: 11 [IU] via SUBCUTANEOUS
  Administered 2020-08-09: 5 [IU] via SUBCUTANEOUS
  Administered 2020-08-10 (×2): 11 [IU] via SUBCUTANEOUS
  Administered 2020-08-10 – 2020-08-11 (×2): 8 [IU] via SUBCUTANEOUS
  Administered 2020-08-11 – 2020-08-12 (×3): 5 [IU] via SUBCUTANEOUS
  Administered 2020-08-12: 09:00:00 3 [IU] via SUBCUTANEOUS
  Filled 2020-08-08 (×12): qty 1

## 2020-08-08 MED ORDER — SODIUM CHLORIDE 0.9 % IV SOLN
1.0000 g | INTRAVENOUS | Status: DC
  Administered 2020-08-08: 1 g via INTRAVENOUS
  Filled 2020-08-08 (×2): qty 10

## 2020-08-08 MED ORDER — SODIUM CHLORIDE 0.9 % IV SOLN
500.0000 mg | INTRAVENOUS | Status: DC
  Administered 2020-08-08: 500 mg via INTRAVENOUS
  Filled 2020-08-08 (×2): qty 500

## 2020-08-08 MED ORDER — SODIUM CHLORIDE 0.9 % IV SOLN: 100.0000 mg | Freq: Every day | INTRAVENOUS | Status: DC

## 2020-08-08 MED ORDER — ADULT MULTIVITAMIN W/MINERALS CH
1.0000 | ORAL_TABLET | Freq: Every day | ORAL | Status: DC
  Administered 2020-08-08 – 2020-08-12 (×5): 1 via ORAL
  Filled 2020-08-08 (×5): qty 1

## 2020-08-08 MED ORDER — INSULIN DETEMIR 100 UNIT/ML ~~LOC~~ SOLN
0.1000 [IU]/kg | Freq: Two times a day (BID) | SUBCUTANEOUS | Status: DC
  Administered 2020-08-08 – 2020-08-09 (×2): 5 [IU] via SUBCUTANEOUS
  Filled 2020-08-08 (×3): qty 0.05

## 2020-08-08 MED ORDER — INSULIN ASPART 100 UNIT/ML IJ SOLN
5.0000 [IU] | Freq: Once | INTRAMUSCULAR | Status: AC
  Administered 2020-08-08: 5 [IU] via SUBCUTANEOUS
  Filled 2020-08-08: qty 1

## 2020-08-08 MED ORDER — BACID PO TABS: 2.0000 | ORAL_TABLET | Freq: Three times a day (TID) | ORAL | Status: DC

## 2020-08-08 MED ORDER — RISAQUAD PO CAPS
2.0000 | ORAL_CAPSULE | Freq: Three times a day (TID) | ORAL | Status: DC
  Administered 2020-08-08 – 2020-08-12 (×11): 2 via ORAL
  Filled 2020-08-08 (×11): qty 2

## 2020-08-08 NOTE — ED Provider Notes (Signed)
[facility] Emergency Department Provider Note ____________________________________________   Event Date/Time   First MD Initiated Contact with Patient 08/08/20 1004     (approximate)  I have reviewed the triage vital signs and the nursing notes.   HISTORY  Chief Complaint Shortness of Breath and COVID+    HPI Deborah Dawson is a 70 y.o. female with PMH as noted below including atrial fibrillation, CHF, diabetes who presents with worsening shortness of breath over the last several days, persistent course, associated with cough productive of green sputum.  The patient denies associated fever.  She was diagnosed with COVID-19 8 days ago.  She states that she received 2 doses of vaccine but did not get a booster.  She went to the outpatient clinic today and was referred to the EGD.   Past Medical History:  Diagnosis Date  . Allergic genetic state   . Atrial fibrillation (HCC) 2010  . Cardiomyopathy (HCC)   . CHF (congestive heart failure) (HCC)   . Diabetes mellitus without complication (HCC)   . Dysrhythmia   . Gout   . H/O cardiac catheterization   . Hypertension   . Joint pain   . Osteopenia   . Psoriasis     Patient Active Problem List   Diagnosis Date Noted  . Acute pulmonary edema (HCC) 06/24/2015  . Acute respiratory failure with hypoxia (HCC) 06/24/2015  . Malignant essential hypertension 06/24/2015  . Chest pain 06/24/2015  . Atrial fibrillation with RVR (HCC) 06/24/2015  . Leukocytosis 06/24/2015  . Heart failure (HCC) 06/24/2015  . Encounter for screening colonoscopy 06/23/2014  . Chest wall pain 06/23/2014    Past Surgical History:  Procedure Laterality Date  . ABDOMINAL HYSTERECTOMY  1985  . BREAST EXCISIONAL BIOPSY Left    negative years ago  . BREAST SURGERY Left 1997   papilloma  . CARDIAC CATHETERIZATION Left 08/01/2015   Procedure: Left Heart Cath and Coronary Angiography;  Surgeon: Dalia Heading, MD;  Location: [facility]  INVASIVE CV LAB;  Service: Cardiovascular;  Laterality: Left;  . CHOLECYSTECTOMY  1986  . COLONOSCOPY WITH PROPOFOL N/A 08/26/2017   Procedure: COLONOSCOPY WITH PROPOFOL;  Surgeon: Toledo, Boykin Nearing, MD;  Location: [facility] ENDOSCOPY;  Service: Gastroenterology;  Laterality: N/A;  . ESOPHAGOGASTRODUODENOSCOPY (EGD) WITH PROPOFOL N/A 08/26/2017   Procedure: ESOPHAGOGASTRODUODENOSCOPY (EGD) WITH PROPOFOL;  Surgeon: Toledo, Boykin Nearing, MD;  Location: [facility] ENDOSCOPY;  Service: Gastroenterology;  Laterality: N/A;  . ESOPHAGOGASTRODUODENOSCOPY (EGD) WITH PROPOFOL N/A 07/04/2019   Procedure: ESOPHAGOGASTRODUODENOSCOPY (EGD) WITH PROPOFOL;  Surgeon: Toledo, Boykin Nearing, MD;  Location: [facility] ENDOSCOPY;  Service: Gastroenterology;  Laterality: N/A;  . TUBAL LIGATION  1981    Prior to Admission medications   Medication Sig Start Date End Date Taking? Authorizing Provider  Calcium Carb-Cholecalciferol (CALCIUM 500+D PO) Take 1 tablet by mouth daily.    [provider]  carvedilol (COREG) 6.25 MG tablet Take 1 tablet (6.25 mg total) by mouth 2 (two) times daily with a meal. 06/26/15   Altamese Dilling, MD  cloNIDine (CATAPRES) 0.2 MG tablet Take 0.2 mg by mouth 3 (three) times daily.    [provider]  digoxin (LANOXIN) 0.25 MG tablet Take 1 tablet (0.25 mg total) by mouth daily. 06/26/15   Altamese Dilling, MD  diltiazem (TIAZAC) 120 MG 24 hr capsule Take 1 capsule by mouth daily. 07/29/19   [provider]  furosemide (LASIX) 20 MG tablet Take 10 mg by mouth daily. 01/05/17   [provider]  glimepiride (  AMARYL) 4 MG tablet Take 4 mg by mouth 2 (two) times daily. 12/10/16   [provider]  insulin glargine (LANTUS SOLOSTAR) 100 UNIT/ML Solostar Pen Inject into the skin. 09/14/19 09/13/20  [provider]  Insulin Pen Needle (PEN NEEDLES 31GX5/16") 31G X 8 MM MISC Use 2 (two) times daily 09/14/19 09/13/20  [provider]  ketorolac (ACULAR) 0.5 %  ophthalmic solution  03/29/20   [provider]  Magnesium Gluconate 550 MG TABS Take by mouth.    [provider]  ofloxacin (OCUFLOX) 0.3 % ophthalmic solution  03/29/20   [provider]  prednisoLONE acetate (PRED FORTE) 1 % ophthalmic suspension  03/29/20   [provider]  Semaglutide,0.25 or 0.5MG /DOS, (OZEMPIC, 0.25 OR 0.5 MG/DOSE,) 2 MG/1.5ML SOPN Inject into the skin.    [provider]  Turmeric 500 MG CAPS Take 1 capsule by mouth daily.    [provider]  Vitamin D, Ergocalciferol, 2000 units CAPS Take 1 capsule by mouth daily.    [provider]  warfarin (COUMADIN) 2 MG tablet Take by mouth. 04/03/20 04/03/21  [provider]    Allergies Doxazosin, Doxycycline, Drug class [clindamycin/lincomycin], Gluten meal, Hydralazine hcl, Metformin and related, Augmentin [amoxicillin-pot clavulanate], Dulaglutide, Hctz [hydrochlorothiazide], Losartan, Poison oak extract, and Red dye  Family History  Problem Relation Age of Onset  . Cancer Mother 44       breast  . Diabetes Mother   . Breast cancer Mother 31  . Coronary artery disease Mother   . Stroke Father   . Coronary artery disease Father   . Diabetes Son     Social History Social History   Tobacco Use  . Smoking status: Never Smoker  . Smokeless tobacco: Never Used  Vaping Use  . Vaping Use: Never used  Substance Use Topics  . Alcohol use: No    Alcohol/week: 0.0 standard drinks  . Drug use: No    Review of Systems  Constitutional: No fever/chills. Eyes: No visual changes. ENT: No sore throat. Cardiovascular: Positive for chest pain. Respiratory: Positive for shortness of breath. Gastrointestinal: No vomiting or diarrhea.  Genitourinary: Negative for dysuria.  Musculoskeletal: Positive for chronic back pain. Skin: Negative for rash. Neurological: Negative for headache.   ____________________________________________   PHYSICAL  EXAM:  VITAL SIGNS: ED Triage Vitals  Enc Vitals Group     BP 08/08/20 0858 127/70     Pulse Rate 08/08/20 0858 62     Resp 08/08/20 0858 19     Temp 08/08/20 0858 98.6 F (37 C)     Temp Source 08/08/20 0858 Oral     SpO2 08/08/20 0858 93 %     Weight 08/08/20 0859 113 lb (51.3 kg)     Height 08/08/20 0859 5\' 1"  (1.549 m)     Head Circumference --      Peak Flow --      Pain Score 08/08/20 0859 3     Pain Loc --      Pain Edu? --      Excl. in GC? --     Constitutional: Alert and oriented.  Weak and frail appearing but in no acute distress. Eyes: Conjunctivae are normal.  Head: Atraumatic. Nose: No congestion/rhinnorhea. Mouth/Throat: Mucous membranes are moist.   Neck: Normal range of motion.  Cardiovascular: Normal rate, regular rhythm. Grossly normal heart sounds.  Good peripheral circulation. Respiratory: Increased respiratory effort, speaking in 1-2 word sentences.  No retractions or accessory muscle use. Lungs  CTAB. Gastrointestinal: Soft and nontender. No distention.  Genitourinary: No flank tenderness. Musculoskeletal: No lower extremity edema.  Extremities warm and well perfused.  Neurologic:  Normal speech and language. No gross focal neurologic deficits are appreciated.  Skin:  Skin is warm and dry. No rash noted. Psychiatric: Mood and affect are normal. Speech and behavior are normal.  ____________________________________________   LABS (all labs ordered are listed, but only abnormal results are displayed)  Labs Reviewed  CBC - Abnormal; Notable for the following components:      Result Value   WBC 21.2 (*)    RBC 5.66 (*)    Hemoglobin 15.6 (*)    Platelets 467 (*)    All other components within normal limits  BASIC METABOLIC PANEL - Abnormal; Notable for the following components:   Sodium 133 (*)    Chloride 94 (*)    Glucose, Bld 338 (*)    BUN 26 (*)    All other components within normal limits  TRIGLYCERIDES - Abnormal; Notable for the  following components:   Triglycerides 154 (*)    All other components within normal limits  FIBRINOGEN - Abnormal; Notable for the following components:   Fibrinogen 643 (*)    All other components within normal limits  TROPONIN I (HIGH SENSITIVITY) - Abnormal; Notable for the following components:   Troponin I (High Sensitivity) 32 (*)    All other components within normal limits  TROPONIN I (HIGH SENSITIVITY) - Abnormal; Notable for the following components:   Troponin I (High Sensitivity) 30 (*)    All other components within normal limits  CULTURE, BLOOD (ROUTINE X 2)  CULTURE, BLOOD (ROUTINE X 2)  LACTIC ACID, PLASMA  PROCALCITONIN  LACTATE DEHYDROGENASE  FERRITIN  C-REACTIVE PROTEIN   ____________________________________________  EKG  ED ECG REPORT I, Dionne Bucy, the attending physician, personally viewed and interpreted this ECG.  Date: 08/08/2020 EKG Time: 0906 Rate: 72 Rhythm: Atrial fibrillation QRS Axis: normal Intervals: LBBB ST/T Wave abnormalities: normal Narrative Interpretation: Atrial fibrillation and LBBB no evidence of acute ischemia; most recent EKG available for comparison is from 2017 and shows ventricular tachycardia  ____________________________________________  RADIOLOGY  Chest x-ray interpreted by me shows patchy opacity in the right lower lung  ____________________________________________   PROCEDURES  Procedure(s) performed: No  Procedures  Critical Care performed: No ____________________________________________   INITIAL IMPRESSION / ASSESSMENT AND PLAN / ED COURSE  Pertinent labs & imaging results that were available during my care of the patient were reviewed by me and considered in my medical decision making (see chart for details).  70 year old female with PMH as noted above including atrial fibrillation, CHF, diabetes presents with worsening shortness of breath and some chest pain after being diagnosed with COVID a  week ago.  I reviewed the past medical records in Epic and confirmed the positive COVID test on 5/10.  On exam, the patient is somewhat weak and frail appearing.  Her vital signs are normal except for tachypnea.  O2 saturation is in the low 90s on room air.  However, the patient does have increased work of breathing, and when she speaks she is only able to say 1-2 words at a time.  Lungs are clear.  There is no peripheral edema.  Exam is otherwise as described above.  Overall presentation is consistent with worsening respiratory failure related to COVID-19.  Differential includes bacterial pneumonia or less likely CHF or other cardiac cause.  Chest x-ray shows patchy right lower lobe opacity.  I  have placed the patient on 2 L of O2 for comfort, and we will obtain lab work-up and reassess.  ----------------------------------------- 1:59 PM on 08/08/2020 -----------------------------------------  Lab work-up is overall reassuring.  The patient does have significant leukocytosis and some elevated inflammatory markers.  Her troponin is slightly elevated.  Given her work of breathing, generalized weakness, and worsening symptoms we will admit.  I consulted Dr. Margette Fast from the hospitalist service.   __________________________  Deborah Dawson was evaluated in Emergency Department on 08/08/2020 for the symptoms described in the history of present illness. She was evaluated in the context of the global COVID-19 pandemic, which necessitated consideration that the patient might be at risk for infection with the SARS-CoV-2 virus that causes COVID-19. Institutional protocols and algorithms that pertain to the evaluation of patients at risk for COVID-19 are in a state of rapid change based on information released by regulatory bodies including the CDC and federal and state organizations. These policies and algorithms were followed during the patient's care in the  ED.  ____________________________________________   FINAL CLINICAL IMPRESSION(S) / ED DIAGNOSES  Final diagnoses:  COVID-19      NEW MEDICATIONS STARTED DURING THIS VISIT:  New Prescriptions   No medications on file     Note:  This document was prepared using Dragon voice recognition software and may include unintentional dictation errors.    Dionne Bucy, MD 08/08/20 [phone]

## 2020-08-08 NOTE — H&P (Addendum)
Chief Complaint: Shortness of breath in a patient who tested COVID +8 days ago HPI:  Deborah Dawson is an 70 y.o. female is a pleasant woman who has had vaccination x2 for COVID.  She has underlying medical comorbidities that include atrial fibrillation, cardiomyopathy(HFrEF), diabetes mellitus, history of psoriasis and atrial fibrillation on anticoagulation.  Patient presented to PCPs office about 1 week ago with complaints of cough productive of yellowish-green sputum.  Symptoms were associated with dyspnea on exertion.  She also had associated URI symptoms of runny nose, nasal congestion and myalgia.  On 07/31/2020, COVID test was done in the office and patient was noted to be positive.  At the time, patient had denied any fever or chills or chest pain.  She was treated with prednisone and albuterol inhaler and discharged.  Patient returned to the ED on 08/08/2020 with persistent symptoms and shortness of breath with exertion.  She required 2 L/min of oxygen to maintain oxygen levels above 92%.  She had symptoms of dyspnea even with talking.  Due to her multiple medical comorbidities and high risk of decompensation, patient was referred to hospitalist service for admission and further management hence this admission.  ED course: Patient was offered Solu-Medrol and bronchodilators in the ED.  Past Medical History:  Diagnosis Date  . Allergic genetic state   . Atrial fibrillation (HCC) 2010  . Cardiomyopathy (HCC)   . CHF (congestive heart failure) (HCC)   . Diabetes mellitus without complication (HCC)   . Dysrhythmia   . Gout   . H/O cardiac catheterization   . Hypertension   . Joint pain   . Osteopenia   . Psoriasis     Past Surgical History:  Procedure Laterality Date  . ABDOMINAL HYSTERECTOMY  1985  . BREAST EXCISIONAL BIOPSY Left    negative years ago  . BREAST SURGERY Left 1997   papilloma  . CARDIAC CATHETERIZATION Left 08/01/2015   Procedure: Left Heart Cath and Coronary  Angiography;  Surgeon: Dalia Heading, MD;  Location: [facility]IVE [facility];  Service: Cardiovascular;  Laterality: Left;  . CHOLECYSTECTOMY  1986  . COLONOSCOPY WITH PROPOFOL N/A 08/26/2017   Procedure: COLONOSCOPY WITH PROPOFOL;  Surgeon: Toledo, Boykin Nearing, MD;  Location: [facility] ENDOSCOPY;  Service: Gastroenterology;  Laterality: N/A;  . ESOPHAGOGASTRODUODENOSCOPY (EGD) WITH PROPOFOL N/A 08/26/2017   Procedure: ESOPHAGOGASTRODUODENOSCOPY (EGD) WITH PROPOFOL;  Surgeon: Toledo, Boykin Nearing, MD;  Location: [facility] ENDOSCOPY;  Service: Gastroenterology;  Laterality: N/A;  . ESOPHAGOGASTRODUODENOSCOPY (EGD) WITH PROPOFOL N/A 07/04/2019   Procedure: ESOPHAGOGASTRODUODENOSCOPY (EGD) WITH PROPOFOL;  Surgeon: Toledo, Boykin Nearing, MD;  Location: [facility] ENDOSCOPY;  Service: Gastroenterology;  Laterality: N/A;  . TUBAL LIGATION  1981    Family History  Problem Relation Age of Onset  . Cancer Mother 49       breast  . Diabetes Mother   . Breast cancer Mother 68  . Coronary artery disease Mother   . Stroke Father   . Coronary artery disease Father   . Diabetes Son    Social History:  reports that she has never smoked. She has never used smokeless tobacco. She reports that she does not drink alcohol and does not use drugs.  Allergies:  Allergies  Allergen Reactions  . Doxazosin Other (See Comments)    Cardura - cough  . Doxycycline     Abdominal pain  . Drug Class [Clindamycin/Lincomycin] Hives  . Gluten Meal Hives and Rash  . Hydralazine Hcl Other (See Comments)    gout  .  Metformin And Related Swelling  . Augmentin [Amoxicillin-Pot Clavulanate] Diarrhea  . Dulaglutide Nausea Only  . Hctz [Hydrochlorothiazide] Other (See Comments)    Gout  . Losartan Diarrhea  . Poison Oak Extract Rash  . Red Dye Rash    (Not in a hospital admission)   Results for orders placed or performed during the hospital encounter of 08/08/20 (from the past 48 hour(s))  CBC     Status: Abnormal   Collection Time: 08/08/20   9:03 AM  Result Value Ref Range   WBC 21.2 (H) 4.0 - 10.5 K/uL   RBC 5.66 (H) 3.87 - 5.11 MIL/uL   Hemoglobin 15.6 (H) 12.0 - 15.0 g/dL   HCT 41.6 38.4 - 53.6 %   MCV 80.7 80.0 - 100.0 fL   MCH 27.6 26.0 - 34.0 pg   MCHC 34.1 30.0 - 36.0 g/dL   RDW 46.8 03.2 - 12.2 %   Platelets 467 (H) 150 - 400 K/uL   nRBC 0.0 0.0 - 0.2 %    Comment: Performed at [facility], [address] [place], [place] [address]  Basic metabolic panel     Status: Abnormal   Collection Time: 08/08/20  9:03 AM  Result Value Ref Range   Sodium 133 (L) 135 - 145 mmol/L   Potassium 4.4 3.5 - 5.1 mmol/L   Chloride 94 (L) 98 - 111 mmol/L   CO2 27 22 - 32 mmol/L   Glucose, Bld 338 (H) 70 - 99 mg/dL    Comment: Glucose reference range applies only to samples taken after fasting for at least 8 hours.   BUN 26 (H) 8 - 23 mg/dL   Creatinine, Ser 0.37 0.44 - 1.00 mg/dL   Calcium 9.3 8.9 - 04.8 mg/dL   GFR, Estimated >88 >91 mL/min    Comment: (NOTE) Calculated using the CKD-EPI Creatinine Equation (2021)    Anion gap 12 5 - 15    Comment: Performed at [facility], [address] [place], [place] [address]  Lactic acid, plasma     Status: None   Collection Time: 08/08/20 10:31 AM  Result Value Ref Range   Lactic Acid, Venous 1.6 0.5 - 1.9 mmol/L    Comment: Performed at [facility], [address] [place], [place] [address]  Procalcitonin     Status: None   Collection Time: 08/08/20 10:31 AM  Result Value Ref Range   Procalcitonin <0.10 ng/mL    Comment:        Interpretation: PCT (Procalcitonin) <= 0.5 ng/mL: Systemic infection (sepsis) is not likely. Local bacterial infection is possible. (NOTE)       Sepsis PCT Algorithm           Lower Respiratory Tract                                      Infection PCT Algorithm    ----------------------------     ----------------------------         PCT < 0.25 ng/mL                PCT < 0.10 ng/mL          Strongly encourage              Strongly discourage   discontinuation of antibiotics    initiation of antibiotics    ----------------------------     -----------------------------  PCT 0.25 - 0.50 ng/mL            PCT 0.10 - 0.25 ng/mL               OR       >80% decrease in PCT            Discourage initiation of                                            antibiotics      Encourage discontinuation           of antibiotics    ----------------------------     -----------------------------         PCT >= 0.50 ng/mL              PCT 0.26 - 0.50 ng/mL               AND        <80% decrease in PCT             Encourage initiation of                                             antibiotics       Encourage continuation           of antibiotics    ----------------------------     -----------------------------        PCT >= 0.50 ng/mL                  PCT > 0.50 ng/mL               AND         increase in PCT                  Strongly encourage                                      initiation of antibiotics    Strongly encourage escalation           of antibiotics                                     -----------------------------                                           PCT <= 0.25 ng/mL                                                 OR                                        > 80% decrease in PCT  Discontinue / Do not initiate                                             antibiotics  Performed at [facility], [address] [place], [place] [address]   Lactate dehydrogenase     Status: None   Collection Time: 08/08/20 10:31 AM  Result Value Ref Range   LDH 149 98 - 192 U/L    Comment: Performed at [facility], [address] [place], [place] [address]  Ferritin     Status: None   Collection Time: 08/08/20 10:31 AM  Result Value Ref Range   Ferritin 262 11 - 307 ng/mL    Comment: Performed at [facility], [address]  [place], [place] [address]  Triglycerides     Status: Abnormal   Collection Time: 08/08/20 10:31 AM  Result Value Ref Range   Triglycerides 154 (H) <150 mg/dL    Comment: Performed at [facility], [address] [place], [place] [address]  Fibrinogen     Status: Abnormal   Collection Time: 08/08/20 10:31 AM  Result Value Ref Range   Fibrinogen 643 (H) 210 - 475 mg/dL    Comment: Performed at [facility], [address] [place], [place] [address]  Troponin I (High Sensitivity)     Status: Abnormal   Collection Time: 08/08/20 10:31 AM  Result Value Ref Range   Troponin I (High Sensitivity) 32 (H) <18 ng/L    Comment: (NOTE) Elevated high sensitivity troponin I (hsTnI) values and significant  changes across serial measurements may suggest ACS but many other  chronic and acute conditions are known to elevate hsTnI results.  Refer to the "Links" section for chest pain algorithms and additional  guidance. Performed at [facility], [address] [place], [place] [address]   Troponin I (High Sensitivity)     Status: Abnormal   Collection Time: 08/08/20 12:15 PM  Result Value Ref Range   Troponin I (High Sensitivity) 30 (H) <18 ng/L    Comment: (NOTE) Elevated high sensitivity troponin I (hsTnI) values and significant  changes across serial measurements may suggest ACS but many other  chronic and acute conditions are known to elevate hsTnI results.  Refer to the "Links" section for chest pain algorithms and additional  guidance. Performed at [facility], [address] [place], [place] [address]    DG Chest 2 View  Result Date: 08/08/2020 CLINICAL DATA:  Shortness of breath. EXAM: CHEST - 2 VIEW COMPARISON:  10/25/2015. FINDINGS: New patchy airspace opacities in the right lower lung. No visible pleural effusions or pneumothorax. Similar cardiomediastinal silhouette. Exaggerated thoracic kyphosis and reverse S-shaped thoracolumbar curvature.  Cholecystectomy clips. IMPRESSION: New patchy airspace opacities in the right lower lung, concerning for pneumonia. Electronically Signed   By: Feliberto Harts MD   On: 08/08/2020 09:46    Review of Systems  Constitutional: Positive for activity change, appetite change, chills and fever.  HENT: Negative.   Respiratory: Positive for chest tightness and shortness of breath.   Cardiovascular: Negative.   Gastrointestinal: Negative.   Genitourinary: Negative.   Musculoskeletal: Positive for myalgias.  Neurological: Negative.   Hematological: Does not bruise/bleed easily.    Blood pressure (!) 150/70, pulse 60, temperature 98.6 F (37 C), temperature source Oral, resp. rate 15, height 5\' 1"  (1.549 m), weight  51.3 kg, SpO2 96 %. Physical Exam Constitutional:      Appearance: She is normal weight. She is ill-appearing.  HENT:     Head: Normocephalic.  Eyes:     Pupils: Pupils are equal, round, and reactive to light.  Cardiovascular:     Rate and Rhythm: Normal rate and regular rhythm.  Pulmonary:     Effort: Tachypnea and respiratory distress present.  Abdominal:     Palpations: Abdomen is soft.  Musculoskeletal:     Cervical back: Normal range of motion.  Skin:    General: Skin is warm.  Neurological:     General: No focal deficit present.     Mental Status: She is alert.      Assessment/Plan  COVID-19 pneumonia with associated mild respiratory distress.  Due to patient's age and medical comorbidities, patient high risk of decompensation.  Patient was supplemented with 2 L/min of oxygen.  Due to hypoxia, patient will be continued on Decadron, Not a candidate for remdesivir due to duration of symptoms > 10 days.  Inflammatory markers elevated.  Will monitor on current regimen for improvement.  We will send CRP.  Community-acquired pneumonia:superimposed with COVID infection. Highly suspicious for superimposed bacterial infection due to focal density noted on right lung.   Patient has multiple drug allergies.  IV azithromycin will be offered.  History of cardiomyopathy with HFrEF.  Patient looks compensated at this time.  We will continue with Lasix for gentle diuresis.  Continue telemetry monitoring.  Continue with cardioprotective medications.  Chronic atrial fibrillation: Rate controlled at this time.  Continue with home regimen.  Continue with anticoagulation with Coumadin.  INR pending for today. Daily INr monitoring advised.  Diabetes mellitus: Blood glucose not at goal most likely secondary to glucocorticoid induced.  Will resume on long-acting insulin.  Insulin sliding scale for blood glucose control.  Premeal insulin 3 times daily also advised.  Fingerstick glucose monitoring as per protocol.  Leukocytosis: Multifactorial most likely due to infection and effects of glucocorticoids.  Supratherapeutic INR: Hold Coumadin and monitor INR daily.  No bleeding diathesis at this time. Lilia Pro, MD 08/08/2020, 4:26 PM

## 2020-08-08 NOTE — ED Notes (Signed)
Pt resting in bed with husband at bedside. Pt states feels "a little better". Pt remains on 2L via [place].

## 2020-08-08 NOTE — ED Triage Notes (Signed)
Pt comes from [facility] with c/o SOB and COVID+. Pt states she tested + on 07-31-20. Pt states increased SOB.  Pt states green mucus.

## 2020-08-08 NOTE — ED Notes (Signed)
Dr. Mervyn Skeeters made aware pt's repeat CBG 418. No new orders.

## 2020-08-08 NOTE — ED Notes (Signed)
This RN to bedside, explained patient is able to eat however no meal trays available at this time, explained will bring patient a meal tray when available. Pt remains on 2L via [place]. Pt's husband remains at bedside at this time. NAD noted at this time.

## 2020-08-08 NOTE — ED Notes (Signed)
Pt resting in bed at this time, denies any needs. Call bell within reach of patient. Pt remains on 2L via [place] at this time.

## 2020-08-09 DIAGNOSIS — U071 COVID-19: Secondary | ICD-10-CM | POA: Diagnosis not present

## 2020-08-09 DIAGNOSIS — E440 Moderate protein-calorie malnutrition: Secondary | ICD-10-CM | POA: Insufficient documentation

## 2020-08-09 LAB — CBC WITH DIFFERENTIAL/PLATELET
Abs Immature Granulocytes: 0.31 10*3/uL — ABNORMAL HIGH (ref 0.00–0.07)
Basophils Absolute: 0.1 10*3/uL (ref 0.0–0.1)
Basophils Relative: 0 %
Eosinophils Absolute: 0 10*3/uL (ref 0.0–0.5)
Eosinophils Relative: 0 %
HCT: 44.2 % (ref 36.0–46.0)
Hemoglobin: 14.9 g/dL (ref 12.0–15.0)
Immature Granulocytes: 1 %
Lymphocytes Relative: 6 %
Lymphs Abs: 1.5 10*3/uL (ref 0.7–4.0)
MCH: 27.8 pg (ref 26.0–34.0)
MCHC: 33.7 g/dL (ref 30.0–36.0)
MCV: 82.5 fL (ref 80.0–100.0)
Monocytes Absolute: 1.5 10*3/uL — ABNORMAL HIGH (ref 0.1–1.0)
Monocytes Relative: 6 %
Neutro Abs: 23.6 10*3/uL — ABNORMAL HIGH (ref 1.7–7.7)
Neutrophils Relative %: 87 %
Platelets: 492 10*3/uL — ABNORMAL HIGH (ref 150–400)
RBC: 5.36 MIL/uL — ABNORMAL HIGH (ref 3.87–5.11)
RDW: 13.2 % (ref 11.5–15.5)
Smear Review: NORMAL
WBC: 27 10*3/uL — ABNORMAL HIGH (ref 4.0–10.5)
nRBC: 0 % (ref 0.0–0.2)

## 2020-08-09 LAB — GLUCOSE, CAPILLARY
Glucose-Capillary: 331 mg/dL — ABNORMAL HIGH (ref 70–99)
Glucose-Capillary: 380 mg/dL — ABNORMAL HIGH (ref 70–99)
Glucose-Capillary: 247 mg/dL — ABNORMAL HIGH (ref 70–99)
Glucose-Capillary: 258 mg/dL — ABNORMAL HIGH (ref 70–99)

## 2020-08-09 LAB — COMPREHENSIVE METABOLIC PANEL
ALT: 19 U/L (ref 0–44)
AST: 14 U/L — ABNORMAL LOW (ref 15–41)
Albumin: 2.8 g/dL — ABNORMAL LOW (ref 3.5–5.0)
Alkaline Phosphatase: 98 U/L (ref 38–126)
Anion gap: 12 (ref 5–15)
BUN: 36 mg/dL — ABNORMAL HIGH (ref 8–23)
CO2: 27 mmol/L (ref 22–32)
Calcium: 8.9 mg/dL (ref 8.9–10.3)
Chloride: 97 mmol/L — ABNORMAL LOW (ref 98–111)
Creatinine, Ser: 0.88 mg/dL (ref 0.44–1.00)
GFR, Estimated: >60 mL/min (ref >60)
Glucose, Bld: 319 mg/dL — ABNORMAL HIGH (ref 70–99)
Potassium: 4 mmol/L (ref 3.5–5.1)
Sodium: 136 mmol/L (ref 135–145)
Total Bilirubin: 0.6 mg/dL (ref 0.3–1.2)
Total Protein: 7 g/dL (ref 6.5–8.1)

## 2020-08-09 LAB — PROCALCITONIN: Procalcitonin: 0.31 ng/mL

## 2020-08-09 LAB — PROTIME-INR
INR: 4.8 (ref 0.8–1.2)
Prothrombin Time: 44.9 seconds — ABNORMAL HIGH (ref 11.4–15.2)

## 2020-08-09 LAB — C-REACTIVE PROTEIN: CRP: 11.3 mg/dL — ABNORMAL HIGH (ref <1.0)

## 2020-08-09 LAB — D-DIMER, QUANTITATIVE: D-Dimer, Quant: 0.27 ug/mL-FEU (ref 0.00–0.50)

## 2020-08-09 MED ORDER — FUROSEMIDE 20 MG PO TABS
20.0000 mg | ORAL_TABLET | Freq: Every day | ORAL | Status: DC
  Administered 2020-08-10 – 2020-08-12 (×3): 20 mg via ORAL
  Filled 2020-08-09 (×3): qty 1

## 2020-08-09 MED ORDER — DILTIAZEM HCL ER COATED BEADS 120 MG PO CP24
120.0000 mg | ORAL_CAPSULE | Freq: Every day | ORAL | Status: DC
  Administered 2020-08-09: 120 mg via ORAL
  Filled 2020-08-09 (×2): qty 1

## 2020-08-09 MED ORDER — MELATONIN 5 MG PO TABS
5.0000 mg | ORAL_TABLET | Freq: Every day | ORAL | Status: DC
  Administered 2020-08-09 – 2020-08-11 (×3): 5 mg via ORAL
  Filled 2020-08-09 (×4): qty 1

## 2020-08-09 MED ORDER — GLUCERNA SHAKE PO LIQD
237.0000 mL | Freq: Three times a day (TID) | ORAL | Status: DC
  Administered 2020-08-09 – 2020-08-12 (×9): 237 mL via ORAL

## 2020-08-09 MED ORDER — INSULIN DETEMIR 100 UNIT/ML ~~LOC~~ SOLN
10.0000 [IU] | Freq: Two times a day (BID) | SUBCUTANEOUS | Status: DC
  Administered 2020-08-09 – 2020-08-10 (×2): 10 [IU] via SUBCUTANEOUS
  Filled 2020-08-09 (×4): qty 0.1

## 2020-08-09 MED ORDER — SODIUM CHLORIDE 0.9 % IV SOLN
500.0000 mg | INTRAVENOUS | Status: DC
  Administered 2020-08-09: 500 mg via INTRAVENOUS
  Filled 2020-08-09: qty 500

## 2020-08-09 MED ORDER — DIGOXIN 250 MCG PO TABS
0.2500 mg | ORAL_TABLET | Freq: Every day | ORAL | Status: DC
  Administered 2020-08-09 – 2020-08-12 (×4): 0.25 mg via ORAL
  Filled 2020-08-09 (×4): qty 1

## 2020-08-09 MED ORDER — CARVEDILOL 6.25 MG PO TABS: 6.2500 mg | ORAL_TABLET | Freq: Two times a day (BID) | ORAL | Status: DC

## 2020-08-09 MED ORDER — CARVEDILOL 3.125 MG PO TABS
3.1250 mg | ORAL_TABLET | Freq: Two times a day (BID) | ORAL | Status: DC
  Administered 2020-08-09: 3.125 mg via ORAL
  Filled 2020-08-09: qty 1

## 2020-08-09 MED ORDER — CLONIDINE HCL 0.1 MG PO TABS
0.2000 mg | ORAL_TABLET | Freq: Three times a day (TID) | ORAL | Status: DC
  Administered 2020-08-09 – 2020-08-12 (×10): 0.2 mg via ORAL
  Filled 2020-08-09 (×10): qty 2

## 2020-08-09 MED ORDER — SODIUM CHLORIDE 0.9 % IV SOLN
1.0000 g | INTRAVENOUS | Status: DC
  Administered 2020-08-09: 18:00:00 1 g via INTRAVENOUS
  Filled 2020-08-09: qty 1

## 2020-08-09 NOTE — Progress Notes (Addendum)
PROGRESS NOTE    Deborah Dawson  TDS:[id number] DOB: 02/26/1951 DOA: 08/08/2020 PCP: Barbette Reichmann, MD   Brief Narrative: 70 year old with past medical history significant for cardiomyopathy heart failure reduced ejection fraction, A. fib, diabetes, psoriasis, who presents with worsening shortness of breath, worsening cough.  She was diagnosed with COVID on 07/31/2020.  She was treated as an outpatient with prednisone and albuterol.  She returned to the ED with worsening shortness of breath and cough, she was found to be hypoxic.  Chest x-ray showed new patchy airspace opacity in the right lower lung, concerning for pneumonia.    Assessment & Plan:   Active Problems:   Acute respiratory failure with hypoxia (HCC)   Malignant essential hypertension   Atrial fibrillation with RVR (HCC)   COVID-19 with multiple comorbidities  1-COVID-19 pneumonia, Acute hypoxic respiratory failure: Patient presented with leukocytosis, pro-calcitoni less than 0.10. plan to cycle pro-calcitonin.  Continue with Ceftriaxone and azithromycin Continue with Remdesivir day 2  and dexamethasone Prone position.  COVID-19 Labs  Recent Labs    08/08/20 1031 08/09/20 0446  DDIMER  --  0.27  FERRITIN 262  --   LDH 149  --   CRP 12.5* 11.3*   Covid positive PCR [place].   2-History of A. Fib: [place] carvedilol and diltiazem Coumadin per pharmacy.  Supratherapeutic INR Plan to hold Coumadin. Resume  digoxin  3-Hypertension malignant: Resume clonidine, Cardizem and carvedilol  4-Diabetes Type 2: Increase levemir, SSI. Meals coverage.  5-Chronic Systolic Heart failure , Prior EF 20% 2017.  Will  repeat ECHO.  Resume low dose lasix.    Estimated body mass index is 21.83 kg/m as calculated from the following:   Height as of this encounter: 5\' 1"  (1.549 m).   Weight as of this encounter: 52.4 kg.   DVT prophylaxis: On coumadin Code Status: Full code Family Communication: Disposition Plan:   Status is: Inpatient  Remains inpatient appropriate because:IV treatments appropriate due to intensity of illness or inability to take PO   Dispo: The patient is from: Home              Anticipated d/c is to: Home              Patient currently is not medically stable to d/c.   Difficult to place patient No        Consultants:   None  Procedures:   None  Antimicrobials:    Subjective: She is feeling nauseous. Poor appetite. Report cough and SOB  Objective: Vitals:   08/08/20 2335 08/09/20 0621 08/09/20 0700 08/09/20 1145  BP: (!) 178/95 (!) 185/98 (!) 181/94 (!) 169/104  Pulse: 67 75 70 76  Resp: (!) 21 16 18 18   Temp: 98.5 F (36.9 C) 98.7 F (37.1 C) 98.6 F (37 C) 98.1 F (36.7 C)  TempSrc: Oral Oral Oral   SpO2: 95% 96% 96% 98%  Weight:      Height:        Intake/Output Summary (Last 24 hours) at 08/09/2020 1447 Last data filed at 08/08/2020 1922 Gross per 24 hour  Intake 500 ml  Output --  Net 500 ml   Filed Weights   08/08/20 0859 08/08/20 2111  Weight: 51.3 kg 52.4 kg    Examination:  General exam: Appears calm and comfortable  Respiratory system: No wheezing, mild tachypnea.  Cardiovascular system: S1 & S2 heard, IRR. No JVD, murmurs, rubs, gallops or clicks. No pedal edema. Gastrointestinal system: Abdomen is nondistended, soft and  nontender. No organomegaly or masses felt. Normal bowel sounds heard. Central nervous system: Alert and oriented. Extremities: Symmetric 5 x 5 power. Skin: No rashes, lesions or ulcers    Data Reviewed: I have personally reviewed following labs and imaging studies  CBC: Recent Labs  Lab 08/08/20 0903 08/09/20 0446  WBC 21.2* 27.0*  NEUTROABS  --  23.6*  HGB 15.6* 14.9  HCT 45.7 44.2  MCV 80.7 82.5  PLT 467* 492*   Basic Metabolic Panel: Recent Labs  Lab 08/08/20 0903 08/09/20 0446  NA 133* 136  K 4.4 4.0  CL 94* 97*  CO2 27 27  GLUCOSE 338* 319*  BUN 26* 36*  CREATININE 0.75 0.88   CALCIUM 9.3 8.9   GFR: Estimated Creatinine Clearance: 44.2 mL/min (by C-G formula based on SCr of 0.88 mg/dL). Liver Function Tests: Recent Labs  Lab 08/09/20 0446  AST 14*  ALT 19  ALKPHOS 98  BILITOT 0.6  PROT 7.0  ALBUMIN 2.8*   No results for input(s): LIPASE, AMYLASE in the last 168 hours. No results for input(s): AMMONIA in the last 168 hours. Coagulation Profile: Recent Labs  Lab 08/08/20 0902 08/09/20 0446  INR 5.2* 4.8*   Cardiac Enzymes: No results for input(s): CKTOTAL, CKMB, CKMBINDEX, TROPONINI in the last 168 hours. BNP (last 3 results) No results for input(s): PROBNP in the last 8760 hours. HbA1C: No results for input(s): HGBA1C in the last 72 hours. CBG: Recent Labs  Lab 08/08/20 1858 08/08/20 2101 08/08/20 2335 08/09/20 0758 08/09/20 1141  GLUCAP 418* 334* 309* 331* 380*   Lipid Profile: Recent Labs    08/08/20 1031  TRIG 154*   Thyroid Function Tests: No results for input(s): TSH, T4TOTAL, FREET4, T3FREE, THYROIDAB in the last 72 hours. Anemia Panel: Recent Labs    08/08/20 1031  FERRITIN 262   Sepsis Labs: Recent Labs  Lab 08/08/20 1031  PROCALCITON <0.10  LATICACIDVEN 1.6    Recent Results (from the past 240 hour(s))  Blood Culture (routine x 2)     Status: None (Preliminary result)   Collection Time: 08/08/20 10:05 AM   Specimen: BLOOD  Result Value Ref Range Status   Specimen Description BLOOD RIGHT ANTECUBITAL  Final   Special Requests   Final    BOTTLES DRAWN AEROBIC AND ANAEROBIC Blood Culture adequate volume   Culture   Final    NO GROWTH < 24 HOURS Performed at [facility], [address] [place], [place] [address]    Report Status PENDING  Incomplete  Blood Culture (routine x 2)     Status: None (Preliminary result)   Collection Time: 08/08/20 10:31 AM   Specimen: BLOOD  Result Value Ref Range Status   Specimen Description BLOOD RIGHT WRIST  Final   Special Requests   Final    BOTTLES DRAWN  AEROBIC AND ANAEROBIC Blood Culture adequate volume   Culture   Final    NO GROWTH < 24 HOURS Performed at [facility], [address], [place], [place] [address]    Report Status PENDING  Incomplete         Radiology Studies: DG Chest 2 View  Result Date: 08/08/2020 CLINICAL DATA:  Shortness of breath. EXAM: CHEST - 2 VIEW COMPARISON:  10/25/2015. FINDINGS: New patchy airspace opacities in the right lower lung. No visible pleural effusions or pneumothorax. Similar cardiomediastinal silhouette. Exaggerated thoracic kyphosis and reverse S-shaped thoracolumbar curvature. Cholecystectomy clips. IMPRESSION: New patchy airspace opacities in the right lower lung, concerning for  pneumonia. Electronically Signed   By: Feliberto Harts MD   On: 08/08/2020 09:46        Scheduled Meds: . acidophilus  2 capsule Oral TID  . albuterol  2 puff Inhalation Q6H  . carvedilol  3.125 mg Oral BID WC  . cloNIDine  0.2 mg Oral TID  . dexamethasone  6 mg Oral Q24H  . digoxin  0.25 mg Oral Daily  . diltiazem  120 mg Oral Daily  . insulin aspart  0-15 Units Subcutaneous TID WC  . insulin aspart  3 Units Subcutaneous TID WC  . insulin detemir  10 Units Subcutaneous BID  . multivitamin with minerals  1 tablet Oral Daily   Continuous Infusions: . azithromycin Stopped (08/08/20 1813)  . cefTRIAXone (ROCEPHIN)  IV Stopped (08/08/20 1925)  . remdesivir 100 mg in NS 100 mL 100 mg (08/09/20 1159)     LOS: 1 day    Time spent: 35 minutes.     Alba Cory, MD [facility]   If 7PM-7AM, please contact night-coverage [email or web address]  08/09/2020, 2:47 PM

## 2020-08-09 NOTE — Progress Notes (Signed)
Initial Nutrition Assessment  DOCUMENTATION CODES:  Non-severe (moderate) malnutrition in context of chronic illness  INTERVENTION:   Liberalize diet to carb modified and remove gluten allergy as pt reports she eats gluten containing products at home  Continue MVI with minerals  Glucerna Shake po TID, each supplement provides 220 kcal and 10 grams of protein  NUTRITION DIAGNOSIS:  Moderate Malnutrition related to chronic illness as evidenced by mild fat depletion,mild muscle depletion,moderate muscle depletion,percent weight loss (9% x 4 months).  GOAL:  Patient will meet greater than or equal to 90% of their needs  MONITOR:  PO intake,Supplement acceptance,Labs  REASON FOR ASSESSMENT:  Malnutrition Screening Tool    ASSESSMENT:  Pt presented with worsening SOB and productive cough over the last several days. Was directed to ED from PCP office. Pt dx with COVID-19 8 days ago. PMH includes atrial fibrillation, CHF, DM, gout, HTN, osteopenia.  Pt resting in bed at the time of assessment. States breathing is some better today, but still becomes SOB while conversing. Discussed recent nutrition status. Pt reports that she lost about 5 lbs in the last week, but has lost over 100 lb in the last year. States she also started eating less over the last year and intake at home is minimal. States she doesn't have much af an appetite. 9% weight loss noted x 4 months, which is severe (1/27-5/18).  Discussed intake this admission, pt reports her intake is fairly good and is almost to baseline. Diet restrictions are limiting pt's ability to order foods that sounds good to her. Currently has gluten allergy listed, pt requests this be removed as she does eat regular pasta at home. Will also liberalize diet to carb consistent to provide more options. Educated on increased fluid and nutrition needs with COVID19 infection, pt agreeable to supplements between meals.   Relevant Scheduled Meds: .  acidophilus  2 capsule Oral TID  . dexamethasone  6 mg Oral Q24H  . insulin aspart  0-15 Units Subcutaneous TID WC  . insulin aspart  3 Units Subcutaneous TID WC  . insulin detemir  10 Units Subcutaneous BID  . multivitamin with minerals  1 tablet Oral Daily   Relevant PRN Meds: bisacodyl  Labs reviewed:  BUN 36  SBG ranges from 309-436 mg/dL over the last 24 hours  HgbA1c 12% (2/22)  Triglycerides 154, Ferritin 262, CRP 12.5  NUTRITION - FOCUSED PHYSICAL EXAM: Flowsheet Row Most Recent Value  Orbital Region Mild depletion  Upper Arm Region No depletion  Thoracic and Lumbar Region No depletion  Buccal Region Mild depletion  Temple Region Moderate depletion  Clavicle Bone Region Moderate depletion  Clavicle and Acromion Bone Region Mild depletion  Scapular Bone Region Mild depletion  Dorsal Hand Mild depletion  Patellar Region Moderate depletion  Anterior Thigh Region Moderate depletion  Posterior Calf Region Moderate depletion  Edema (RD Assessment) None  Hair Reviewed  Eyes Reviewed  Mouth Reviewed  Skin Reviewed  Nails Reviewed     Diet Order:   Diet Order            Diet Carb Modified Fluid consistency: Thin; Room service appropriate? Yes  Diet effective now                EDUCATION NEEDS:  No education needs have been identified at this time  Skin:  Skin Assessment: Reviewed RN Assessment  Last BM:  5/17 per RN documentation  Height:  Ht Readings from Last 1 Encounters:  08/08/20 5\' 1"  (1.549  m)   Weight:  Wt Readings from Last 1 Encounters:  08/08/20 52.4 kg    Ideal Body Weight:  47.7 kg  BMI:  Body mass index is 21.83 kg/m.  Estimated Nutritional Needs:   Kcal:  1500-1700 kcal/d  Protein:  75-85 g/d  Fluid:  >1500 mL/d  Greig Castilla, RD, LDN Clinical Dietitian Pager on Amion

## 2020-08-09 NOTE — Evaluation (Signed)
Physical Therapy Evaluation Patient Details Name: Deborah Dawson MRN: [id number] DOB: Dec 11, 1950 Today's Date: 08/09/2020   History of Present Illness  Pt is a 70 yo female that presented to the ED for SOB, cough; stated she was diagnosed with COVID19 on 07/31/2020. PMH of afib, CHF, DM.    Clinical Impression  Patient alert, oriented, denied pain. Stated she feels a little better than admission. Reported that she is independent at baseline, no AD, husband and her split IADLs as needed. Denied falls.  The patient demonstrated bed mobility modI. Sit <> Stand twice during session with CGA, pt does often reach for external support at least unilaterally, educated on use of RW for further mobility, verbalized agreement. True ambulation deferred due to pt fatigue but pt upright at bedside stepping forwards/backwards for several minutes. Pt also educated on importance of continued mobility and OOB to chair, declining this session.  Overall the patient demonstrated deficits (see "PT Problem List") that impede the patient's functional abilities, safety, and mobility and would benefit from skilled PT intervention. Recommendation is HHPT with intermittent supervision pending further progress with ambulation. OT consult placed to assist with energy conservation/ADLs.      Follow Up Recommendations Home health PT;Supervision - Intermittent    Equipment Recommendations  Rolling walker with 5" wheels    Recommendations for Other Services OT consult     Precautions / Restrictions Precautions Precautions: Fall Restrictions Weight Bearing Restrictions: No      Mobility  Bed Mobility Overal bed mobility: Modified Independent                  Transfers Overall transfer level: Needs assistance Equipment used: 1 person hand held assist Transfers: Sit to/from Stand Sit to Stand: Min guard         General transfer comment: use of handrails/sink  Ambulation/Gait   Gait Distance (Feet): 4  Feet         General Gait Details: able to step towards sink, sideways, true ambulation deferred due to fatigue  Stairs            Wheelchair Mobility    Modified Rankin (Stroke Patients Only)       Balance Overall balance assessment: Needs assistance Sitting-balance support: Feet supported Sitting balance-Leahy Scale: Normal     Standing balance support: Single extremity supported Standing balance-Leahy Scale: Fair Standing balance comment: pt more comfortable with at least unilateral support                             Pertinent Vitals/Pain Pain Assessment: No/denies pain    Home Living Family/patient expects to be discharged to:: Private residence Living Arrangements: Spouse/significant other Available Help at Discharge: Family Type of Home: House (has a basement) Home Access: Stairs to enter Entrance Stairs-Rails: Doctor, general practice of Steps: 3-4 Home Layout: Laundry or work area in basement;Able to live on main level with bedroom/bathroom Home Equipment: Environmental consultant - standard;Cane - single point;Shower seat      Prior Function Level of Independence: Independent         Comments: husband assists with IADLs as needed     Hand Dominance        Extremity/Trunk Assessment   Upper Extremity Assessment Upper Extremity Assessment: Overall WFL for tasks assessed    Lower Extremity Assessment Lower Extremity Assessment: Overall WFL for tasks assessed    Cervical / Trunk Assessment Cervical / Trunk Assessment: Kyphotic  Communication   Communication:  No difficulties  Cognition Arousal/Alertness: Awake/alert Behavior During Therapy: WFL for tasks assessed/performed Overall Cognitive Status: Within Functional Limits for tasks assessed                                        General Comments      Exercises Other Exercises Other Exercises: Pt educated on continued mobility, change in positions, up to  chair at least once a day Other Exercises: PT assisted with purewick and brief change   Assessment/Plan    PT Assessment Patient needs continued PT services  PT Problem List Decreased strength;Decreased balance;Decreased activity tolerance;Decreased mobility       PT Treatment Interventions DME instruction;Balance training;Gait training;Neuromuscular re-education;Stair training;Functional mobility training;Patient/family education;Therapeutic activities;Therapeutic exercise    PT Goals (Current goals can be found in the Care Plan section)  Acute Rehab PT Goals Patient Stated Goal: to go home when she feels better, can breathe better PT Goal Formulation: With patient Time For Goal Achievement: 08/23/20 Potential to Achieve Goals: Good    Frequency Min 2X/week   Barriers to discharge        Co-evaluation               AM-PAC PT "6 Clicks" Mobility  Outcome Measure Help needed turning from your back to your side while in a flat bed without using bedrails?: None Help needed moving from lying on your back to sitting on the side of a flat bed without using bedrails?: None Help needed moving to and from a bed to a chair (including a wheelchair)?: A Little Help needed standing up from a chair using your arms (e.g., wheelchair or bedside chair)?: A Little Help needed to walk in hospital room?: A Little Help needed climbing 3-5 steps with a railing? : A Little 6 Click Score: 20    End of Session Equipment Utilized During Treatment: Gait belt;Oxygen (1.5L) Activity Tolerance: Patient tolerated treatment well Patient left: in bed;with call bell/phone within reach;with bed alarm set Nurse Communication: Mobility status PT Visit Diagnosis: Other abnormalities of gait and mobility (R26.89)    Time: 1610-9604 PT Time Calculation (min) (ACUTE ONLY): 25 min   Charges:   PT Evaluation $PT Eval Low Complexity: 1 Low PT Treatments $Therapeutic Activity: 23-37 mins       Olga Coaster PT, DPT 3:44 PM,08/09/20

## 2020-08-09 NOTE — Plan of Care (Addendum)
End of Shift Summary:  Alert and oriented x4. Elevated BP, other VSS. Remained on 2L via [place], sats >94%. Blood glucose elevated, but trending downwards. Pain managed with prn medications. Endorses nausea from "not sleeping." Refused anti-emetics in favor of saltines and diet ginger ale. Patient is interested in melatonin to help her sleep tonight. Productive cough, pt reports sputum is a "lighter green and not as thick." Remained free from falls or injury. Call bell within reach and able to use.   Problem: Education: Goal: Knowledge of General Education information will improve Description: Including pain rating scale, medication(s)/side effects and non-pharmacologic comfort measures 08/08/2020 2235 by Tawanna Cooler, RN Outcome: Progressing 08/08/2020 2234 by Tawanna Cooler, RN Outcome: Progressing   Problem: Health Behavior/Discharge Planning: Goal: Ability to manage health-related needs will improve 08/08/2020 2235 by Tawanna Cooler, RN Outcome: Progressing 08/08/2020 2234 by Tawanna Cooler, RN Outcome: Progressing   Problem: Clinical Measurements: Goal: Ability to maintain clinical measurements within normal limits will improve 08/08/2020 2235 by Tawanna Cooler, RN Outcome: Progressing 08/08/2020 2234 by Tawanna Cooler, RN Outcome: Progressing Goal: Will remain free from infection 08/08/2020 2235 by Tawanna Cooler, RN Outcome: Progressing 08/08/2020 2234 by Tawanna Cooler, RN Outcome: Progressing Goal: Diagnostic test results will improve 08/08/2020 2235 by Tawanna Cooler, RN Outcome: Progressing 08/08/2020 2234 by Tawanna Cooler, RN Outcome: Progressing Goal: Respiratory complications will improve 08/08/2020 2235 by Tawanna Cooler, RN Outcome: Progressing 08/08/2020 2234 by Tawanna Cooler, RN Outcome: Progressing Goal: Cardiovascular complication will be avoided 08/08/2020 2235 by Tawanna Cooler, RN Outcome: Progressing 08/08/2020  2234 by Tawanna Cooler, RN Outcome: Progressing   Problem: Activity: Goal: Risk for activity intolerance will decrease 08/08/2020 2235 by Tawanna Cooler, RN Outcome: Progressing 08/08/2020 2234 by Tawanna Cooler, RN Outcome: Progressing   Problem: Coping: Goal: Level of anxiety will decrease 08/08/2020 2235 by Tawanna Cooler, RN Outcome: Progressing 08/08/2020 2234 by Tawanna Cooler, RN Outcome: Progressing   Problem: Safety: Goal: Ability to remain free from injury will improve 08/08/2020 2235 by Tawanna Cooler, RN Outcome: Progressing 08/08/2020 2234 by Tawanna Cooler, RN Outcome: Progressing   Problem: Respiratory: Goal: Will maintain a patent airway 08/08/2020 2235 by Tawanna Cooler, RN Outcome: Progressing 08/08/2020 2234 by Tawanna Cooler, RN Outcome: Progressing   Problem: Respiratory: Goal: Complications related to the disease process, condition or treatment will be avoided or minimized 08/08/2020 2235 by Tawanna Cooler, RN Outcome: Progressing 08/08/2020 2234 by Tawanna Cooler, RN Outcome: Progressing

## 2020-08-09 NOTE — Care Management Important Message (Signed)
Important Message  Patient Details  Name: Deborah Dawson MRN: [id number] Date of Birth: 1950/04/15   Medicare Important Message Given:  N/A - LOS <3 / Initial given by admissions  Initial Medicare IM reviewed with patient by Caprice Renshaw, Patient Access Associate at 08/09/2020 at 9:34am.     Johnell Comings 08/09/2020, 6:47 PM

## 2020-08-09 NOTE — Consult Note (Signed)
ANTICOAGULATION CONSULT NOTE - Initial Consult  Pharmacy Consult for warfarin Indication: atrial fibrillation  Allergies  Allergen Reactions  . Doxazosin Other (See Comments)    Cardura - cough  . Doxycycline     Abdominal pain  . Drug Class [Clindamycin/Lincomycin] Hives  . Gluten Meal Hives and Rash  . Hydralazine Hcl Other (See Comments)    gout  . Metformin And Related Swelling  . Augmentin [Amoxicillin-Pot Clavulanate] Diarrhea  . Dulaglutide Nausea Only  . Hctz [Hydrochlorothiazide] Other (See Comments)    Gout  . Losartan Diarrhea  . Poison Oak Extract Rash  . Red Dye Rash    Patient Measurements: Height: 5\' 1"  (154.9 cm) Weight: 52.4 kg (115 lb 8.3 oz) IBW/kg (Calculated) : 47.8  Vital Signs: Temp: 98.1 F (36.7 C) (05/19 1145) Temp Source: Oral (05/19 0700) BP: 169/104 (05/19 1145) Pulse Rate: 76 (05/19 1145)  Labs: Recent Labs    08/08/20 0902 08/08/20 0903 08/08/20 1031 08/08/20 1215 08/09/20 0446  HGB  --  15.6*  --   --  14.9  HCT  --  45.7  --   --  44.2  PLT  --  467*  --   --  492*  LABPROT 48.4*  --   --   --  44.9*  INR 5.2*  --   --   --  4.8*  CREATININE  --  0.75  --   --  0.88  TROPONINIHS  --   --  32* 30*  --     Estimated Creatinine Clearance: 44.2 mL/min (by C-G formula based on SCr of 0.88 mg/dL).   Medical History: Past Medical History:  Diagnosis Date  . Allergic genetic state   . Atrial fibrillation (HCC) 2010  . Cardiomyopathy (HCC)   . CHF (congestive heart failure) (HCC)   . Diabetes mellitus without complication (HCC)   . Dysrhythmia   . Gout   . H/O cardiac catheterization   . Hypertension   . Joint pain   . Osteopenia   . Psoriasis     Medications:  PTA warfarin dose 2-2.5mg  to be clarified by medication reconciliation awaiting return fax  Assessment: 70 yo female with a history of atrial fibrillation on warfarin presenting with worsening SOB and cough, being treated for COVID PNA, malignant HTN, and  DM2.    Pharmacy is consulted to monitor INR and restart warfarin therapy when appropriate.   INR Warfarin dose 05/18 5.2 held 05/19 4.8 held  Goal of Therapy:  INR 2-3 Monitor platelets by anticoagulation protocol: Yes   Plan:  Will hold warfarin dose for tonight per supratherapeutic INR INR daily, CBC a minimum of every 3 days per protocol  6/19, PharmD, BCPS Clinical Pharmacist 08/09/2020 3:08 PM

## 2020-08-10 ENCOUNTER — Inpatient Hospital Stay
Admit: 2020-08-10 | Discharge: 2020-08-10 | Disposition: A | Discharge status: Home / Self Care | Payer: Medicare Other | Attending: Internal Medicine | Admitting: Internal Medicine

## 2020-08-10 ENCOUNTER — Other Ambulatory Visit: Payer: Self-pay

## 2020-08-10 DIAGNOSIS — J9601 Acute respiratory failure with hypoxia: Secondary | ICD-10-CM | POA: Diagnosis not present

## 2020-08-10 DIAGNOSIS — U071 COVID-19: Secondary | ICD-10-CM | POA: Diagnosis not present

## 2020-08-10 LAB — COMPREHENSIVE METABOLIC PANEL
ALT: 17 U/L (ref 0–44)
AST: 22 U/L (ref 15–41)
Albumin: 2.6 g/dL — ABNORMAL LOW (ref 3.5–5.0)
Alkaline Phosphatase: 80 U/L (ref 38–126)
Anion gap: 10 (ref 5–15)
BUN: 51 mg/dL — ABNORMAL HIGH (ref 8–23)
CO2: 27 mmol/L (ref 22–32)
Calcium: 8.9 mg/dL (ref 8.9–10.3)
Chloride: 100 mmol/L (ref 98–111)
Creatinine, Ser: 0.81 mg/dL (ref 0.44–1.00)
GFR, Estimated: >60 mL/min (ref >60)
Glucose, Bld: 271 mg/dL — ABNORMAL HIGH (ref 70–99)
Potassium: 4.2 mmol/L (ref 3.5–5.1)
Sodium: 137 mmol/L (ref 135–145)
Total Bilirubin: 0.7 mg/dL (ref 0.3–1.2)
Total Protein: 6 g/dL — ABNORMAL LOW (ref 6.5–8.1)

## 2020-08-10 LAB — PROTIME-INR
INR: 3 — ABNORMAL HIGH (ref 0.8–1.2)
Prothrombin Time: 30.8 seconds — ABNORMAL HIGH (ref 11.4–15.2)

## 2020-08-10 LAB — GLUCOSE, CAPILLARY
Glucose-Capillary: 259 mg/dL — ABNORMAL HIGH (ref 70–99)
Glucose-Capillary: 329 mg/dL — ABNORMAL HIGH (ref 70–99)
Glucose-Capillary: 316 mg/dL — ABNORMAL HIGH (ref 70–99)
Glucose-Capillary: 300 mg/dL — ABNORMAL HIGH (ref 70–99)

## 2020-08-10 LAB — CBC WITH DIFFERENTIAL/PLATELET
Abs Immature Granulocytes: 0.3 10*3/uL — ABNORMAL HIGH (ref 0.00–0.07)
Basophils Absolute: 0.1 10*3/uL (ref 0.0–0.1)
Basophils Relative: 0 %
Eosinophils Absolute: 0 10*3/uL (ref 0.0–0.5)
Eosinophils Relative: 0 %
HCT: 41.5 % (ref 36.0–46.0)
Hemoglobin: 14 g/dL (ref 12.0–15.0)
Immature Granulocytes: 1 %
Lymphocytes Relative: 8 %
Lymphs Abs: 1.9 10*3/uL (ref 0.7–4.0)
MCH: 27.9 pg (ref 26.0–34.0)
MCHC: 33.7 g/dL (ref 30.0–36.0)
MCV: 82.7 fL (ref 80.0–100.0)
Monocytes Absolute: 0.9 10*3/uL (ref 0.1–1.0)
Monocytes Relative: 4 %
Neutro Abs: 21.8 10*3/uL — ABNORMAL HIGH (ref 1.7–7.7)
Neutrophils Relative %: 87 %
Platelets: 477 10*3/uL — ABNORMAL HIGH (ref 150–400)
RBC: 5.02 MIL/uL (ref 3.87–5.11)
RDW: 13.5 % (ref 11.5–15.5)
WBC: 25 10*3/uL — ABNORMAL HIGH (ref 4.0–10.5)
nRBC: 0 % (ref 0.0–0.2)

## 2020-08-10 LAB — MAGNESIUM: Magnesium: 2 mg/dL (ref 1.7–2.4)

## 2020-08-10 LAB — D-DIMER, QUANTITATIVE: D-Dimer, Quant: 0.41 ug/mL-FEU (ref 0.00–0.50)

## 2020-08-10 LAB — DIGOXIN LEVEL: Digoxin Level: 1.2 ng/mL (ref 0.8–2.0)

## 2020-08-10 LAB — C-REACTIVE PROTEIN: CRP: 5.5 mg/dL — ABNORMAL HIGH (ref <1.0)

## 2020-08-10 LAB — PROCALCITONIN: Procalcitonin: 0.18 ng/mL

## 2020-08-10 MED ORDER — INSULIN ASPART 100 UNIT/ML IJ SOLN
5.0000 [IU] | Freq: Three times a day (TID) | INTRAMUSCULAR | Status: DC
  Administered 2020-08-10 – 2020-08-12 (×5): 5 [IU] via SUBCUTANEOUS
  Filled 2020-08-10 (×6): qty 1

## 2020-08-10 MED ORDER — WARFARIN SODIUM 2 MG PO TABS
2.0000 mg | ORAL_TABLET | Freq: Once | ORAL | Status: AC
  Administered 2020-08-10: 15:00:00 2 mg via ORAL
  Filled 2020-08-10: qty 1

## 2020-08-10 MED ORDER — WARFARIN - PHARMACIST DOSING INPATIENT: Freq: Every day | Status: DC

## 2020-08-10 MED ORDER — GLIMEPIRIDE 4 MG PO TABS
4.0000 mg | ORAL_TABLET | Freq: Two times a day (BID) | ORAL | Status: DC
  Administered 2020-08-10 – 2020-08-12 (×5): 4 mg via ORAL
  Filled 2020-08-10 (×6): qty 1

## 2020-08-10 MED ORDER — SODIUM CHLORIDE 0.9 % IV SOLN
1.0000 g | INTRAVENOUS | Status: DC
  Administered 2020-08-10 – 2020-08-11 (×2): 1 g via INTRAVENOUS
  Filled 2020-08-10: qty 1
  Filled 2020-08-10 (×2): qty 10

## 2020-08-10 MED ORDER — DILTIAZEM HCL ER COATED BEADS 120 MG PO CP24
120.0000 mg | ORAL_CAPSULE | Freq: Every day | ORAL | Status: DC
  Administered 2020-08-11 – 2020-08-12 (×2): 120 mg via ORAL
  Filled 2020-08-10 (×2): qty 1

## 2020-08-10 MED ORDER — CARVEDILOL 3.125 MG PO TABS
3.1250 mg | ORAL_TABLET | Freq: Two times a day (BID) | ORAL | Status: DC
  Administered 2020-08-10 – 2020-08-12 (×4): 3.125 mg via ORAL
  Filled 2020-08-10 (×4): qty 1

## 2020-08-10 MED ORDER — SODIUM CHLORIDE 0.9 % IV SOLN
100.0000 mg | Freq: Two times a day (BID) | INTRAVENOUS | Status: DC
  Administered 2020-08-10 – 2020-08-12 (×5): 100 mg via INTRAVENOUS
  Filled 2020-08-10 (×6): qty 100

## 2020-08-10 MED ORDER — INSULIN DETEMIR 100 UNIT/ML ~~LOC~~ SOLN
5.0000 [IU] | Freq: Once | SUBCUTANEOUS | Status: AC
  Administered 2020-08-10: 15:00:00 5 [IU] via SUBCUTANEOUS
  Filled 2020-08-10: qty 0.05

## 2020-08-10 MED ORDER — INSULIN DETEMIR 100 UNIT/ML ~~LOC~~ SOLN
15.0000 [IU] | Freq: Two times a day (BID) | SUBCUTANEOUS | Status: DC
  Administered 2020-08-10 – 2020-08-12 (×4): 15 [IU] via SUBCUTANEOUS
  Filled 2020-08-10 (×6): qty 0.15

## 2020-08-10 NOTE — Evaluation (Signed)
Occupational Therapy Evaluation Patient Details Name: Deborah Dawson MRN: [id number] DOB: 1950/10/03 Today's Date: 08/10/2020    History of Present Illness Pt is a 70 yo female that presented to the ED for SOB, cough; stated she was diagnosed with COVID19 on 07/31/2020. PMH of afib, CHF, DM.   Clinical Impression   Pt seen for OT evaluation this date. Pt was independent in all ADL and functional mobility prior to admission. Pt reports becoming easily fatigued or out of breath with minimal exertion. Pt currently requires increased time/effort to perform ADL and ADL mobility. Supplemental O2 recently removed and pt on room air. Denies complaints, SpO2 >95% at rest. Pt presents with impaired activity tolerance impacting her risk of falls. Pt educated in energy conservation strategies including pursed lip breathing, activity pacing, home/routines modifications, work simplification, AE/DME, prioritizing of meaningful occupations, and falls prevention. Handout provided. Pt verbalized understanding and would benefit from additional skilled OT services to maximize recall and carryover of learned techniques and facilitate implementation of learned techniques into daily routines. Staff in at end of session for echo.    Follow Up Recommendations  No OT follow up    Equipment Recommendations  None recommended by OT    Recommendations for Other Services       Precautions / Restrictions Precautions Precautions: Fall Restrictions Weight Bearing Restrictions: No      Mobility Bed Mobility               General bed mobility comments: deferred, staff in for echo    Transfers                 General transfer comment: deferred, staff in for echo    Balance                                           ADL either performed or assessed with clinical judgement   ADL Overall ADL's : Modified independent                                       General ADL  Comments: increased time/effort, supervision for safety     Vision Patient Visual Report: No change from baseline       Perception     Praxis      Pertinent Vitals/Pain Pain Assessment: No/denies pain     Hand Dominance     Extremity/Trunk Assessment Upper Extremity Assessment Upper Extremity Assessment: Overall WFL for tasks assessed   Lower Extremity Assessment Lower Extremity Assessment: Overall WFL for tasks assessed   Cervical / Trunk Assessment Cervical / Trunk Assessment: Kyphotic   Communication Communication Communication: No difficulties   Cognition Arousal/Alertness: Awake/alert Behavior During Therapy: WFL for tasks assessed/performed Overall Cognitive Status: Within Functional Limits for tasks assessed                                     General Comments       Exercises Other Exercises Other Exercises: Pt educated in ECS with handout provided   Shoulder Instructions      Home Living Family/patient expects to be discharged to:: Private residence Living Arrangements: Spouse/significant other Available Help at Discharge: Family Type of Home: House (has a basement)  Home Access: Stairs to enter Entrance Stairs-Number of Steps: 3-4 Entrance Stairs-Rails: Right;Left Home Layout: Laundry or work area in basement;Able to live on main level with bedroom/bathroom               Home Equipment: Walker - standard;Cane - single point;Shower seat          Prior Functioning/Environment Level of Independence: Independent        Comments: husband assists with IADLs as needed        OT Problem List: Decreased activity tolerance;Decreased knowledge of use of DME or AE      OT Treatment/Interventions: Self-care/ADL training;Therapeutic exercise;Therapeutic activities;Energy conservation;DME and/or AE instruction;Patient/family education    OT Goals(Current goals can be found in the care plan section) Acute Rehab OT Goals Patient  Stated Goal: to go home when she feels better, can breathe better OT Goal Formulation: With patient Time For Goal Achievement: 08/24/20 Potential to Achieve Goals: Good ADL Goals Pt Will Transfer to Toilet: with modified independence;ambulating Additional ADL Goal #1: Pt will perform daily morning ADL routine with use of learned ECS with PRN VC for implementing ECS. Additional ADL Goal #2: Pt will verbalize plan to implement at least 2 learned ECS into daily routines in order to minimize SOB/over exertion.  OT Frequency: Min 1X/week   Barriers to D/C:            Co-evaluation              AM-PAC OT "6 Clicks" Daily Activity     Outcome Measure Help from another person eating meals?: None Help from another person taking care of personal grooming?: None Help from another person toileting, which includes using toliet, bedpan, or urinal?: A Little Help from another person bathing (including washing, rinsing, drying)?: A Little Help from another person to put on and taking off regular upper body clothing?: None Help from another person to put on and taking off regular lower body clothing?: A Little 6 Click Score: 21   End of Session    Activity Tolerance: Patient tolerated treatment well Patient left: in bed;with call bell/phone within reach;with bed alarm set;Other (comment) (staff in for echo)  OT Visit Diagnosis: Other abnormalities of gait and mobility (R26.89)                Time: 1700-1749 OT Time Calculation (min): 18 min Charges:  OT General Charges $OT Visit: 1 Visit OT Evaluation $OT Eval Low Complexity: 1 Low OT Treatments $Self Care/Home Management : 8-22 mins  Wynona Canes, MPH, MS, OTR/L ascom [phone] 08/10/20, 1:41 PM

## 2020-08-10 NOTE — Progress Notes (Addendum)
PROGRESS NOTE    Deborah Dawson  GUR:[id number] DOB: 10-Feb-1951 DOA: 08/08/2020 PCP: Barbette Reichmann, MD   Brief Narrative: 70 year old with past medical history significant for cardiomyopathy heart failure reduced ejection fraction, A. fib, diabetes, psoriasis, who presents with worsening shortness of breath, worsening cough.  She was diagnosed with COVID on 07/31/2020.  She was treated as an outpatient with prednisone and albuterol.  She returned to the ED with worsening shortness of breath and cough, she was found to be hypoxic.  Chest x-ray showed new patchy airspace opacity in the right lower lung, concerning for pneumonia.    Assessment & Plan:   Active Problems:   Acute respiratory failure with hypoxia (HCC)   Malignant essential hypertension   Atrial fibrillation with RVR (HCC)   COVID-19 with multiple comorbidities   Malnutrition of moderate degree  1-COVID-19 pneumonia, Acute hypoxic respiratory failure: Patient presented with leukocytosis, pro-calcitoni less than 0.10. plan to cycle pro-calcitonin.  Continue with Ceftriaxone and Doxy  total of 5 days treatment.  Continue with Remdesivir day 3  and dexamethasone Prone position.  COVID-19 Labs  Recent Labs    08/08/20 1031 08/09/20 0446 08/10/20 0653  DDIMER  --  0.27 0.41  FERRITIN 262  --   --   LDH 149  --   --   CRP 12.5* 11.3* 5.5*   Covid positive PCR kernodle.  She is feeling better today, still SOB.   2-History of A. Fib: Hold carvedilol and diltiazem, due to bradycardia.  Coumadin per pharmacy.  Supratherapeutic INR Plan to hold Coumadin. Resume  digoxin QRS prolong last night, Mg normal, Digoxin NL.   3-Hypertension malignant: Resume clonidine, Cardizem and carvedilol  4-Diabetes Type 2, Hyperglycemia,  Increase levemir 15 units.  SSI. Meals coverage.   5-Chronic Systolic Heart failure , Prior EF 20% 2017.  Follow ECHO.  Resume low dose lasix.    Estimated body mass index is 21.83 kg/m as  calculated from the following:   Height as of this encounter: 5\' 1"  (1.549 m).   Weight as of this encounter: 52.4 kg.   DVT prophylaxis: On coumadin Code Status: Full code Family Communication: Husband 5/19,, 5/20. Disposition Plan:  Status is: Inpatient  Remains inpatient appropriate because:IV treatments appropriate due to intensity of illness or inability to take PO   Dispo: The patient is from: Home              Anticipated d/c is to: Home              Patient currently is not medically stable to d/c.plan for discharge on Sunday    Difficult to place patient No        Consultants:   None  Procedures:   None  Antimicrobials:    Subjective: She is feeling better, still with SOB, start weaning oxygen.   Objective: Vitals:   08/10/20 0637 08/10/20 0834 08/10/20 0856 08/10/20 1136  BP:  (!) 155/71  (!) 165/80  Pulse: (!) 56 62 86 66  Resp: 19 17  15   Temp:  97.9 F (36.6 C)  98.2 F (36.8 C)  TempSrc:  Oral  Oral  SpO2: 96% 100% 98% 96%  Weight:      Height:        Intake/Output Summary (Last 24 hours) at 08/10/2020 1338 Last data filed at 08/10/2020 08/12/2020 Gross per 24 hour  Intake 837 ml  Output 850 ml  Net -13 ml   Filed Weights   08/08/20 0859 08/08/20  2111  Weight: 51.3 kg 52.4 kg    Examination:  General exam: NAD Respiratory system: No wheezing, no crackles.  Cardiovascular system: S 1, S 2 RRR Gastrointestinal system: BS present, soft, nt Central nervous system: non focal.  Extremities: no edema Skin: No rashes, lesions or ulcers    Data Reviewed: I have personally reviewed following labs and imaging studies  CBC: Recent Labs  Lab 08/08/20 0903 08/09/20 0446 08/10/20 0653  WBC 21.2* 27.0* 25.0*  NEUTROABS  --  23.6* 21.8*  HGB 15.6* 14.9 14.0  HCT 45.7 44.2 41.5  MCV 80.7 82.5 82.7  PLT 467* 492* 477*   Basic Metabolic Panel: Recent Labs  Lab 08/08/20 0903 08/09/20 0446 08/10/20 0653  NA 133* 136 137  K 4.4 4.0  4.2  CL 94* 97* 100  CO2 27 27 27   GLUCOSE 338* 319* 271*  BUN 26* 36* 51*  CREATININE 0.75 0.88 0.81  CALCIUM 9.3 8.9 8.9  MG  --   --  2.0   GFR: Estimated Creatinine Clearance: 48.1 mL/min (by C-G formula based on SCr of 0.81 mg/dL). Liver Function Tests: Recent Labs  Lab 08/09/20 0446 08/10/20 0653  AST 14* 22  ALT 19 17  ALKPHOS 98 80  BILITOT 0.6 0.7  PROT 7.0 6.0*  ALBUMIN 2.8* 2.6*   No results for input(s): LIPASE, AMYLASE in the last 168 hours. No results for input(s): AMMONIA in the last 168 hours. Coagulation Profile: Recent Labs  Lab 08/08/20 0902 08/09/20 0446 08/10/20 0653  INR 5.2* 4.8* 3.0*   Cardiac Enzymes: No results for input(s): CKTOTAL, CKMB, CKMBINDEX, TROPONINI in the last 168 hours. BNP (last 3 results) No results for input(s): PROBNP in the last 8760 hours. HbA1C: No results for input(s): HGBA1C in the last 72 hours. CBG: Recent Labs  Lab 08/09/20 1141 08/09/20 1614 08/09/20 2005 08/10/20 0838 08/10/20 1138  GLUCAP 380* 247* 258* 259* 329*   Lipid Profile: Recent Labs    08/08/20 1031  TRIG 154*   Thyroid Function Tests: No results for input(s): TSH, T4TOTAL, FREET4, T3FREE, THYROIDAB in the last 72 hours. Anemia Panel: Recent Labs    08/08/20 1031  FERRITIN 262   Sepsis Labs: Recent Labs  Lab 08/08/20 1031 08/09/20 0446 08/10/20 0653  PROCALCITON <0.10 0.31 0.18  LATICACIDVEN 1.6  --   --     Recent Results (from the past 240 hour(s))  Blood Culture (routine x 2)     Status: None (Preliminary result)   Collection Time: 08/08/20 10:05 AM   Specimen: BLOOD  Result Value Ref Range Status   Specimen Description BLOOD RIGHT ANTECUBITAL  Final   Special Requests   Final    BOTTLES DRAWN AEROBIC AND ANAEROBIC Blood Culture adequate volume   Culture   Final    NO GROWTH 2 DAYS Performed at [facility], [address] [place], [place]    Report Status PENDING  Incomplete  Blood Culture  (routine x 2)     Status: None (Preliminary result)   Collection Time: 08/08/20 10:31 AM   Specimen: BLOOD  Result Value Ref Range Status   Specimen Description BLOOD RIGHT WRIST  Final   Special Requests   Final    BOTTLES DRAWN AEROBIC AND ANAEROBIC Blood Culture adequate volume   Culture   Final    NO GROWTH 2 DAYS Performed at [facility], [address], [place], [place]    Report Status PENDING  Incomplete  Radiology Studies: No results found.      Scheduled Meds: . acidophilus  2 capsule Oral TID  . albuterol  2 puff Inhalation Q6H  . carvedilol  3.125 mg Oral BID WC  . cloNIDine  0.2 mg Oral TID  . dexamethasone  6 mg Oral Q24H  . digoxin  0.25 mg Oral Daily  . [START ON 08/11/2020] diltiazem  120 mg Oral Daily  . feeding supplement (GLUCERNA SHAKE)  237 mL Oral TID BM  . furosemide  20 mg Oral Daily  . insulin aspart  0-15 Units Subcutaneous TID WC  . insulin aspart  5 Units Subcutaneous TID WC  . insulin detemir  15 Units Subcutaneous BID  . melatonin  5 mg Oral QHS  . multivitamin with minerals  1 tablet Oral Daily  . warfarin  2 mg Oral ONCE-1600  . Warfarin - Pharmacist Dosing Inpatient   Does not apply q1600   Continuous Infusions: . cefTRIAXone (ROCEPHIN)  IV    . doxycycline (VIBRAMYCIN) IV 100 mg (08/10/20 0849)  . remdesivir 100 mg in NS 100 mL 100 mg (08/10/20 1153)     LOS: 2 days    Time spent: 35 minutes.     Alba Cory, MD [facility]   If 7PM-7AM, please contact night-coverage [email or web address]  08/10/2020, 1:38 PM

## 2020-08-10 NOTE — Progress Notes (Signed)
[employer] RN reports QRS widening called from CCMD EKG confirms QRS slightly wier at 148 msec.   1. Dig level 2. Check mag - replete electrolytes, goal K above 4 and mag above 2 3 change azithromycin to doxy 4 Continue to monitor on tel 6 hold coreg if ventricular rate below 60

## 2020-08-10 NOTE — Progress Notes (Signed)
*  PRELIMINARY RESULTS* Echocardiogram 2D Echocardiogram has been performed.  Deborah Dawson 08/10/2020, 10:45 AM

## 2020-08-10 NOTE — Consult Note (Addendum)
ANTICOAGULATION CONSULT NOTE - Initial Consult  Pharmacy Consult for warfarin Indication: atrial fibrillation  Allergies  Allergen Reactions  . Doxazosin Other (See Comments)    Cardura - cough  . Doxycycline     Abdominal pain  . Drug Class [Clindamycin/Lincomycin] Hives  . Hydralazine Hcl Other (See Comments)    gout  . Metformin And Related Swelling  . Ciprofloxacin Other (See Comments)    Numbness in face  . Iodine Other (See Comments)  . Augmentin [Amoxicillin-Pot Clavulanate] Diarrhea  . Dulaglutide Nausea Only  . Hctz [Hydrochlorothiazide] Other (See Comments)    Gout  . Losartan Diarrhea  . Poison Oak Extract Rash  . Red Dye Rash    Patient Measurements: Height: 5\' 1"  (154.9 cm) Weight: 52.4 kg (115 lb 8.3 oz) IBW/kg (Calculated) : 47.8  Vital Signs: Temp: 97.4 F (36.3 C) (05/20 0354) Temp Source: Oral (05/20 0354) BP: 153/70 (05/20 0530) Pulse Rate: 56 (05/20 0637)  Labs: Recent Labs    08/08/20 0902 08/08/20 0903 08/08/20 1031 08/08/20 1215 08/09/20 0446 08/10/20 0653  HGB  --  15.6*  --   --  14.9  --   HCT  --  45.7  --   --  44.2  --   PLT  --  467*  --   --  492*  --   LABPROT 48.4*  --   --   --  44.9* 30.8*  INR 5.2*  --   --   --  4.8* 3.0*  CREATININE  --  0.75  --   --  0.88 0.81  TROPONINIHS  --   --  32* 30*  --   --     Estimated Creatinine Clearance: 48.1 mL/min (by C-G formula based on SCr of 0.81 mg/dL).   Medical History: Past Medical History:  Diagnosis Date  . Allergic genetic state   . Atrial fibrillation (HCC) 2010  . Cardiomyopathy (HCC)   . CHF (congestive heart failure) (HCC)   . Diabetes mellitus without complication (HCC)   . Dysrhythmia   . Gout   . H/O cardiac catheterization   . Hypertension   . Joint pain   . Osteopenia   . Psoriasis     Medications:  PTA warfarin dose 2.5mg  qd - clarified by medication reconciliation - last home dose 5/17  Patient has received 2 days of azithromycin  (5/18-5/19)  Monitor for toxic effects of coumarin derivatives if a tetracycline derivative is initiated/dose increased (doxycycline started today 5/20)  Cephalosporins may act to enhance anticoagulant effects of vitamin K antagonists (ceftriaxone started 5/18)  Assessment: 70 yo female with a history of atrial fibrillation on warfarin presenting with worsening SOB and cough, being treated for COVID PNA, malignant HTN, and DM2.    Pharmacy is consulted to monitor INR and restart warfarin therapy when appropriate.   INR Warfarin dose 05/18 5.2 held 05/19 4.8 Held 05/20  3.0 2.0mg   Goal of Therapy:  INR 2-3 Monitor platelets by anticoagulation protocol: Yes   Plan:  Will resume warfarin dose of 2.0 mg for tonight per therapeutic INR  This is slightly lower than home dose given initiation of tetracycline antibiotic today, history of azithromycin, and ongoing ceftriaxone therapy as referenced above. Also, pt was supratherapeutic on admission on home dose of 2.5mg  daily  INR daily, CBC a minimum of every 3 days per protocol  6/20, PharmD, BCPS Clinical Pharmacist 08/10/2020 8:14 AM

## 2020-08-10 NOTE — Progress Notes (Signed)
Inpatient Diabetes Program Recommendations  AACE/ADA: New Consensus Statement on Inpatient Glycemic Control (2015)  Target Ranges:  Prepandial:   less than 140 mg/dL      Peak postprandial:   less than 180 mg/dL (1-2 hours)      Critically ill patients:  140 - 180 mg/dL   Lab Results  Component Value Date   GLUCAP 329 (H) 08/10/2020   HGBA1C 7.9 (H) 06/24/2015    Review of Glycemic Control Results for Deborah Dawson, LAHAM (MRN [id number]) as of 08/10/2020 12:49  Ref. Range 08/09/2020 11:41 08/09/2020 16:14 08/09/2020 20:05 08/10/2020 08:38 08/10/2020 11:38  Glucose-Capillary Latest Ref Range: 70 - 99 mg/dL 644 (H) 034 (H) 742 (H) 259 (H) 329 (H)   Diabetes history: DM 2 Outpatient Diabetes medications:  Amaryl 4 mg bid, Lantus 10 units q HS, Ozempic 0.5 mg weekly Current orders for Inpatient glycemic control:  Decadron 6 mg q 24 hours Novolog moderate tid with meals Novolog 3 units tid with meals Levemir 10 units bid  Inpatient Diabetes Program Recommendations:    While on steroids, please increase Levemir to 15 units bid and increase Novolog to 5 units tid with meals.   Thanks,  Beryl Meager, RN, BC-ADM Inpatient Diabetes Coordinator Pager [phone] (8a-5p)

## 2020-08-11 ENCOUNTER — Encounter: Payer: Self-pay | Admitting: Internal Medicine

## 2020-08-11 DIAGNOSIS — J9601 Acute respiratory failure with hypoxia: Secondary | ICD-10-CM | POA: Diagnosis not present

## 2020-08-11 LAB — CBC WITH DIFFERENTIAL/PLATELET
Abs Immature Granulocytes: 0.24 10*3/uL — ABNORMAL HIGH (ref 0.00–0.07)
Basophils Absolute: 0 10*3/uL (ref 0.0–0.1)
Basophils Relative: 0 %
Eosinophils Absolute: 0 10*3/uL (ref 0.0–0.5)
Eosinophils Relative: 0 %
HCT: 41.2 % (ref 36.0–46.0)
Hemoglobin: 14.2 g/dL (ref 12.0–15.0)
Immature Granulocytes: 1 %
Lymphocytes Relative: 8 %
Lymphs Abs: 1.4 10*3/uL (ref 0.7–4.0)
MCH: 28.1 pg (ref 26.0–34.0)
MCHC: 34.5 g/dL (ref 30.0–36.0)
MCV: 81.4 fL (ref 80.0–100.0)
Monocytes Absolute: 0.7 10*3/uL (ref 0.1–1.0)
Monocytes Relative: 4 %
Neutro Abs: 15.5 10*3/uL — ABNORMAL HIGH (ref 1.7–7.7)
Neutrophils Relative %: 87 %
Platelets: 429 10*3/uL — ABNORMAL HIGH (ref 150–400)
RBC: 5.06 MIL/uL (ref 3.87–5.11)
RDW: 13.5 % (ref 11.5–15.5)
WBC: 17.9 10*3/uL — ABNORMAL HIGH (ref 4.0–10.5)
nRBC: 0 % (ref 0.0–0.2)

## 2020-08-11 LAB — ECHOCARDIOGRAM COMPLETE
AR max vel: 2.04 cm2
AV Area VTI: 2.5 cm2
AV Area mean vel: 2.09 cm2
AV Mean grad: 3 mmHg
AV Peak grad: 6.9 mmHg
Ao pk vel: 1.31 m/s
Area-P 1/2: 4.77 cm2
Height: 61 in
S' Lateral: 2.6 cm
Weight: 1848.34 oz

## 2020-08-11 LAB — COMPREHENSIVE METABOLIC PANEL
ALT: 23 U/L (ref 0–44)
AST: 22 U/L (ref 15–41)
Albumin: 2.5 g/dL — ABNORMAL LOW (ref 3.5–5.0)
Alkaline Phosphatase: 70 U/L (ref 38–126)
Anion gap: 8 (ref 5–15)
BUN: 52 mg/dL — ABNORMAL HIGH (ref 8–23)
CO2: 27 mmol/L (ref 22–32)
Calcium: 8.7 mg/dL — ABNORMAL LOW (ref 8.9–10.3)
Chloride: 101 mmol/L (ref 98–111)
Creatinine, Ser: 0.72 mg/dL (ref 0.44–1.00)
GFR, Estimated: >60 mL/min (ref >60)
Glucose, Bld: 200 mg/dL — ABNORMAL HIGH (ref 70–99)
Potassium: 4.7 mmol/L (ref 3.5–5.1)
Sodium: 136 mmol/L (ref 135–145)
Total Bilirubin: 0.8 mg/dL (ref 0.3–1.2)
Total Protein: 6 g/dL — ABNORMAL LOW (ref 6.5–8.1)

## 2020-08-11 LAB — GLUCOSE, CAPILLARY
Glucose-Capillary: 206 mg/dL — ABNORMAL HIGH (ref 70–99)
Glucose-Capillary: 269 mg/dL — ABNORMAL HIGH (ref 70–99)
Glucose-Capillary: 280 mg/dL — ABNORMAL HIGH (ref 70–99)
Glucose-Capillary: 232 mg/dL — ABNORMAL HIGH (ref 70–99)

## 2020-08-11 LAB — D-DIMER, QUANTITATIVE: D-Dimer, Quant: 0.28 ug/mL-FEU (ref 0.00–0.50)

## 2020-08-11 LAB — PROTIME-INR
INR: 2.4 — ABNORMAL HIGH (ref 0.8–1.2)
Prothrombin Time: 26.5 seconds — ABNORMAL HIGH (ref 11.4–15.2)

## 2020-08-11 LAB — PROCALCITONIN: Procalcitonin: 0.4 ng/mL

## 2020-08-11 LAB — C-REACTIVE PROTEIN: CRP: 2.5 mg/dL — ABNORMAL HIGH (ref <1.0)

## 2020-08-11 MED ORDER — WARFARIN SODIUM 2 MG PO TABS
2.0000 mg | ORAL_TABLET | Freq: Once | ORAL | Status: AC
  Administered 2020-08-11: 16:00:00 2 mg via ORAL
  Filled 2020-08-11: qty 1

## 2020-08-11 NOTE — Progress Notes (Addendum)
PROGRESS NOTE    Deborah Dawson  MVE:[id number] DOB: 1950/08/15 DOA: 08/08/2020 PCP: Barbette Reichmann, MD   Brief Narrative: 70 year old with past medical history significant for cardiomyopathy heart failure reduced ejection fraction, A. fib, diabetes, psoriasis, who presents with worsening shortness of breath, worsening cough.  She was diagnosed with COVID on 07/31/2020.  She was treated as an outpatient with prednisone and albuterol.  She returned to the ED with worsening shortness of breath and cough, she was found to be hypoxic.  Chest x-ray showed new patchy airspace opacity in the right lower lung, concerning for pneumonia.    Assessment & Plan:   Active Problems:   Acute respiratory failure with hypoxia (HCC)   Malignant essential hypertension   Atrial fibrillation with RVR (HCC)   COVID-19 with multiple comorbidities   Malnutrition of moderate degree  1-COVID-19 pneumonia, Acute hypoxic respiratory failure: Patient presented with leukocytosis, pro-calcitoni less than 0.10. plan to cycle pro-calcitonin.  Continue with Ceftriaxone and Doxy  total of 5 days treatment.  Continue with Remdesivir day 4 and dexamethasone Prone position.  COVID-19 Labs  Recent Labs    08/09/20 0446 08/10/20 0653 08/11/20 0530  DDIMER 0.27 0.41 0.28  CRP 11.3* 5.5*  --    Covid positive PCR kernodle.  Improving.    2-History of A. Fib: Resume carvedilol and diltiazem. Monitor.  Coumadin per pharmacy.  Supratherapeutic INR Plan to hold Coumadin. Resume  digoxin QRS prolong last night, Mg normal, Digoxin NL.   3-Hypertension malignant: Resume clonidine, Cardizem and carvedilol  4-Diabetes Type 2, Hyperglycemia,  Increase levemir 15 units.  SSI. Meals coverage.   5-Chronic Systolic Heart failure , Prior EF 25% 2017.  ECHO. Improve EF 35 % Resume low dose lasix.    Estimated body mass index is 21.83 kg/m as calculated from the following:   Height as of this encounter: 5\' 1"  (1.549  m).   Weight as of this encounter: 52.4 kg.   DVT prophylaxis: On coumadin Code Status: Full code Family Communication: Husband 5/19,, 5/20. Disposition Plan:  Status is: Inpatient  Remains inpatient appropriate because:IV treatments appropriate due to intensity of illness or inability to take PO   Dispo: The patient is from: Home              Anticipated d/c is to: Home              Patient currently is not medically stable to d/c.plan for discharge on Sunday    Difficult to place patient No        Consultants:   None  Procedures:   None  Antimicrobials:    Subjective: She is feeling better, still with Cough and SOB  Objective: Vitals:   08/11/20 0320 08/11/20 0558 08/11/20 0758 08/11/20 1331  BP:  (!) 185/97 (!) 160/95 (!) 147/71  Pulse: 66 70 92 (!) 51  Resp: 13 16 16 16   Temp:  98.2 F (36.8 C) 97.8 F (36.6 C) 98.1 F (36.7 C)  TempSrc:  Oral Oral Oral  SpO2: 95% 100% 98% 94%  Weight:      Height:        Intake/Output Summary (Last 24 hours) at 08/11/2020 1404 Last data filed at 08/11/2020 0658 Gross per 24 hour  Intake 756.47 ml  Output 1100 ml  Net -343.53 ml   Filed Weights   08/08/20 0859 08/08/20 2111  Weight: 51.3 kg 52.4 kg    Examination:  General exam: NAD Respiratory system: CTA Cardiovascular system: S 1,  S 2 RRR Gastrointestinal system: BS present, soft, nt Central nervous system: Non focal.  Extremities: No edema   Data Reviewed: I have personally reviewed following labs and imaging studies  CBC: Recent Labs  Lab 08/08/20 0903 08/09/20 0446 08/10/20 0653 08/11/20 0530  WBC 21.2* 27.0* 25.0* 17.9*  NEUTROABS  --  23.6* 21.8* 15.5*  HGB 15.6* 14.9 14.0 14.2  HCT 45.7 44.2 41.5 41.2  MCV 80.7 82.5 82.7 81.4  PLT 467* 492* 477* 429*   Basic Metabolic Panel: Recent Labs  Lab 08/08/20 0903 08/09/20 0446 08/10/20 0653 08/11/20 0530  NA 133* 136 137 136  K 4.4 4.0 4.2 4.7  CL 94* 97* 100 101  CO2 27 27 27 27    GLUCOSE 338* 319* 271* 200*  BUN 26* 36* 51* 52*  CREATININE 0.75 0.88 0.81 0.72  CALCIUM 9.3 8.9 8.9 8.7*  MG  --   --  2.0  --    GFR: Estimated Creatinine Clearance: 48.7 mL/min (by C-G formula based on SCr of 0.72 mg/dL). Liver Function Tests: Recent Labs  Lab 08/09/20 0446 08/10/20 0653 08/11/20 0530  AST 14* 22 22  ALT 19 17 23   ALKPHOS 98 80 70  BILITOT 0.6 0.7 0.8  PROT 7.0 6.0* 6.0*  ALBUMIN 2.8* 2.6* 2.5*   No results for input(s): LIPASE, AMYLASE in the last 168 hours. No results for input(s): AMMONIA in the last 168 hours. Coagulation Profile: Recent Labs  Lab 08/08/20 0902 08/09/20 0446 08/10/20 0653 08/11/20 0530  INR 5.2* 4.8* 3.0* 2.4*   Cardiac Enzymes: No results for input(s): CKTOTAL, CKMB, CKMBINDEX, TROPONINI in the last 168 hours. BNP (last 3 results) No results for input(s): PROBNP in the last 8760 hours. HbA1C: No results for input(s): HGBA1C in the last 72 hours. CBG: Recent Labs  Lab 08/10/20 1138 08/10/20 1615 08/10/20 2026 08/11/20 0757 08/11/20 1228  GLUCAP 329* 316* 300* 206* 269*   Lipid Profile: No results for input(s): CHOL, HDL, LDLCALC, TRIG, CHOLHDL, LDLDIRECT in the last 72 hours. Thyroid Function Tests: No results for input(s): TSH, T4TOTAL, FREET4, T3FREE, THYROIDAB in the last 72 hours. Anemia Panel: No results for input(s): VITAMINB12, FOLATE, FERRITIN, TIBC, IRON, RETICCTPCT in the last 72 hours. Sepsis Labs: Recent Labs  Lab 08/08/20 1031 08/09/20 0446 08/10/20 0653 08/11/20 0530  PROCALCITON <0.10 0.31 0.18 0.40  LATICACIDVEN 1.6  --   --   --     Recent Results (from the past 240 hour(s))  Blood Culture (routine x 2)     Status: None (Preliminary result)   Collection Time: 08/08/20 10:05 AM   Specimen: BLOOD  Result Value Ref Range Status   Specimen Description BLOOD RIGHT ANTECUBITAL  Final   Special Requests   Final    BOTTLES DRAWN AEROBIC AND ANAEROBIC Blood Culture adequate volume   Culture    Final    NO GROWTH 3 DAYS Performed at [facility], [address] [place], [address] [place]    Report Status PENDING  Incomplete  Blood Culture (routine x 2)     Status: None (Preliminary result)   Collection Time: 08/08/20 10:31 AM   Specimen: BLOOD  Result Value Ref Range Status   Specimen Description BLOOD RIGHT WRIST  Final   Special Requests   Final    BOTTLES DRAWN AEROBIC AND ANAEROBIC Blood Culture adequate volume   Culture   Final    NO GROWTH 3 DAYS Performed at [facility], [address] [place], [address] [place]  Report Status PENDING  Incomplete         Radiology Studies: ECHOCARDIOGRAM COMPLETE  Result Date: 08/11/2020    ECHOCARDIOGRAM REPORT   Patient Name:   Deborah Dawson Date of Exam: 08/10/2020 Medical Rec #:  [id number]    Height:       61.0 in Accession #:    [id number]   Weight:       115.5 lb Date of Birth:  10-09-1950    BSA:          1.496 m Patient Age:    69 years     BP:           155/71 mmHg Patient Gender: F            HR:           86 bpm. Exam Location:  [facility] Procedure: 2D Echo, Cardiac Doppler and Color Doppler Indications:     Dyspnea R06.00  History:         Patient has prior history of Echocardiogram examinations, most                  recent 06/25/2015. Arrythmias:Atrial Fibrillation; Risk                  Factors:Hypertension and Diabetes. H/O cardiac cath.  Sonographer:     Cristela Blue RDCS (AE) Referring Phys:  [id number] Alba Cory Diagnosing Phys: Alwyn Pea MD  Sonographer Comments: Suboptimal parasternal window. IMPRESSIONS  1. Ant/apical /septal hypo     IVCD. Left ventricular ejection fraction, by estimation, is 30 to 35%. The left ventricle has moderately decreased function. The left ventricle demonstrates regional wall motion abnormalities (see scoring diagram/findings for description). The left ventricular internal cavity size was mildly to moderately dilated. Left ventricular diastolic parameters were normal.   2. Right ventricular systolic function is mildly reduced. The right ventricular size is mildly enlarged.  3. Left atrial size was moderately dilated.  4. Right atrial size was moderately dilated.  5. The mitral valve is grossly normal. Mild mitral valve regurgitation.  6. Tricuspid valve regurgitation is mild to moderate.  7. The aortic valve is normal in structure. Aortic valve regurgitation is not visualized. FINDINGS  Left Ventricle: Ant/apical /septal hypo IVCD. Left ventricular ejection fraction, by estimation, is 30 to 35%. The left ventricle has moderately decreased function. The left ventricle demonstrates regional wall motion abnormalities. The left ventricular internal cavity size was mildly to moderately dilated. There is no left ventricular hypertrophy. Abnormal (paradoxical) septal motion, consistent with left bundle branch block. Left ventricular diastolic parameters were normal. Right Ventricle: The right ventricular size is mildly enlarged. No increase in right ventricular wall thickness. Right ventricular systolic function is mildly reduced. Left Atrium: Left atrial size was moderately dilated. Right Atrium: Right atrial size was moderately dilated. Pericardium: There is no evidence of pericardial effusion. Mitral Valve: The mitral valve is grossly normal. There is mild thickening of the mitral valve leaflet(s). There is mild calcification of the mitral valve leaflet(s). Mild mitral annular calcification. Mild mitral valve regurgitation. Tricuspid Valve: The tricuspid valve is normal in structure. Tricuspid valve regurgitation is mild to moderate. Aortic Valve: The aortic valve is normal in structure. Aortic valve regurgitation is not visualized. Aortic valve mean gradient measures 3.0 mmHg. Aortic valve peak gradient measures 6.9 mmHg. Aortic valve area, by VTI measures 2.50 cm. Pulmonic Valve: The pulmonic valve was grossly normal. Pulmonic valve regurgitation is not visualized. Aorta: The  ascending  aorta was not well visualized. IAS/Shunts: No atrial level shunt detected by color flow Doppler.  LEFT VENTRICLE PLAX 2D LVIDd:         3.10 cm LVIDs:         2.60 cm LV PW:         1.20 cm LV IVS:        1.05 cm LVOT diam:     2.00 cm LV SV:         46 LV SV Index:   30 LVOT Area:     3.14 cm  RIGHT VENTRICLE RV Basal diam:  2.70 cm RV S prime:     10.70 cm/s TAPSE (M-mode): 3.5 cm LEFT ATRIUM              Index       RIGHT ATRIUM           Index LA diam:        3.80 cm  2.54 cm/m  RA Area:     16.90 cm LA Vol (A2C):   80.4 ml  53.75 ml/m RA Volume:   49.10 ml  32.83 ml/m LA Vol (A4C):   105.0 ml 70.20 ml/m LA Biplane Vol: 92.9 ml  62.11 ml/m  AORTIC VALVE AV Area (Vmax):    2.04 cm AV Area (Vmean):   2.09 cm AV Area (VTI):     2.50 cm AV Vmax:           131.00 cm/s AV Vmean:          79.400 cm/s AV VTI:            0.182 m AV Peak Grad:      6.9 mmHg AV Mean Grad:      3.0 mmHg LVOT Vmax:         84.90 cm/s LVOT Vmean:        52.800 cm/s LVOT VTI:          0.145 m LVOT/AV VTI ratio: 0.80  AORTA Ao Root diam: 2.70 cm MITRAL VALVE               TRICUSPID VALVE MV Area (PHT): 4.77 cm    TR Peak grad:   31.6 mmHg MV Decel Time: 159 msec    TR Vmax:        281.00 cm/s MV E velocity: 99.00 cm/s                            SHUNTS                            Systemic VTI:  0.14 m                            Systemic Diam: 2.00 cm Dwayne Salome Arnt MD Electronically signed by Alwyn Pea MD Signature Date/Time: 08/11/2020/11:25:17 AM    Final         Scheduled Meds: . acidophilus  2 capsule Oral TID  . albuterol  2 puff Inhalation Q6H  . carvedilol  3.125 mg Oral BID WC  . cloNIDine  0.2 mg Oral TID  . dexamethasone  6 mg Oral Q24H  . digoxin  0.25 mg Oral Daily  . diltiazem  120 mg Oral Daily  . feeding supplement (GLUCERNA SHAKE)  237 mL Oral TID BM  . furosemide  20  mg Oral Daily  . glimepiride  4 mg Oral BID  . insulin aspart  0-15 Units Subcutaneous TID WC  . insulin aspart  5  Units Subcutaneous TID WC  . insulin detemir  15 Units Subcutaneous BID  . melatonin  5 mg Oral QHS  . multivitamin with minerals  1 tablet Oral Daily  . warfarin  2 mg Oral ONCE-1600  . Warfarin - Pharmacist Dosing Inpatient   Does not apply q1600   Continuous Infusions: . cefTRIAXone (ROCEPHIN)  IV Stopped (08/10/20 2210)  . doxycycline (VIBRAMYCIN) IV 100 mg (08/11/20 0853)  . remdesivir 100 mg in NS 100 mL Stopped (08/10/20 2210)     LOS: 3 days    Time spent: 35 minutes.     Alba CoryBelkys A Daeveon Zweber, MD [facility]   If 7PM-7AM, please contact night-coverage [email or web address]  08/11/2020, 2:04 PM

## 2020-08-11 NOTE — Consult Note (Signed)
ANTICOAGULATION CONSULT NOTE  Pharmacy Consult for warfarin Indication: atrial fibrillation  Allergies  Allergen Reactions  . Doxazosin Other (See Comments)    Cardura - cough  . Doxycycline     Abdominal pain  . Drug Class [Clindamycin/Lincomycin] Hives  . Hydralazine Hcl Other (See Comments)    gout  . Metformin And Related Swelling  . Ciprofloxacin Other (See Comments)    Numbness in face  . Iodine Other (See Comments)  . Augmentin [Amoxicillin-Pot Clavulanate] Diarrhea  . Dulaglutide Nausea Only  . Hctz [Hydrochlorothiazide] Other (See Comments)    Gout  . Losartan Diarrhea  . Poison Oak Extract Rash  . Red Dye Rash    Patient Measurements: Height: 5\' 1"  (154.9 cm) Weight: 52.4 kg (115 lb 8.3 oz) IBW/kg (Calculated) : 47.8  Vital Signs: Temp: 97.8 F (36.6 C) (05/21 0758) Temp Source: Oral (05/21 0758) BP: 160/95 (05/21 0758) Pulse Rate: 92 (05/21 0758)  Labs: Recent Labs    08/08/20 1031 08/08/20 1215 08/09/20 0446 08/10/20 0653 08/11/20 0530  HGB  --   --  14.9 14.0 14.2  HCT  --   --  44.2 41.5 41.2  PLT  --   --  492* 477* 429*  LABPROT  --   --  44.9* 30.8* 26.5*  INR  --   --  4.8* 3.0* 2.4*  CREATININE  --   --  0.88 0.81 0.72  TROPONINIHS 32* 30*  --   --   --     Estimated Creatinine Clearance: 48.7 mL/min (by C-G formula based on SCr of 0.72 mg/dL).   Medical History: Past Medical History:  Diagnosis Date  . Allergic genetic state   . Atrial fibrillation (HCC) 2010  . Cardiomyopathy (HCC)   . CHF (congestive heart failure) (HCC)   . Diabetes mellitus without complication (HCC)   . Dysrhythmia   . Gout   . H/O cardiac catheterization   . Hypertension   . Joint pain   . Osteopenia   . Psoriasis     Medications:  PTA warfarin dose 2.5mg  qd - clarified by medication reconciliation - last home dose 5/17  Patient has received 2 days of azithromycin (5/18-5/19)  Monitor for toxic effects of coumarin derivatives if a tetracycline  derivative is initiated/dose increased (doxycycline started today 5/20)  Cephalosporins may act to enhance anticoagulant effects of vitamin K antagonists (ceftriaxone started 5/18)  Assessment: 70 yo female with a history of atrial fibrillation on warfarin presenting with worsening SOB and cough, being treated for COVID PNA, malignant HTN, and DM2.    Pharmacy is consulted to monitor INR and restart warfarin therapy when appropriate.   INR Warfarin dose 05/18 5.2 Held 05/19 4.8 Held 05/20  3.0 2.0mg  05/21 2.4   Goal of Therapy:  INR 2-3 Monitor platelets by anticoagulation protocol: Yes   Plan:  INR is therapeutic. Will order warfarin 2 mg x 1 tonight (lower dose from home dose due to pt on abx therapy and pt was supratherapeutic on home dose). Daily INR ordered. CBC at least every 3 days.   6/21, PharmD, BCPS Clinical Pharmacist 08/11/2020 9:01 AM

## 2020-08-11 NOTE — TOC Transition Note (Signed)
Transition of Care [facility]) - CM/SW Discharge Note   Patient Details  Name: Deborah Dawson MRN: [id number] Date of Birth: 1950-11-15  Transition of Care [facility]) CM/SW Contact:  Luvenia Redden, RN Phone Number: [phone] 08/11/2020, 4:57 PM   Clinical Narrative:    Pt will be discharge tomorrow and will need HHPT. RN spoke with pt and she has agreed to [facility] who has accepted pt for services. Pt has a good support system, transportation for discharge pick up and for all medical appointments. Pt also can afford all her discharge medications. Pt has DME in the home for a RW, cane and a shower chair with bars.  TOC remains available with no other needs presented  Final next level of care: Home w Home Health Services Barriers to Discharge: Barriers Resolved   Patient Goals and CMS Choice        Discharge Placement                    Patient and family notified of of transfer: 08/11/20  Discharge Plan and Services                          HH Arranged: RN [facility] Agency: [facility] Date HH Agency Contacted: 08/11/20 Time HH Agency Contacted: [phone] Representative spoke with at [facility] Agency: Barbara Cower  Social Determinants of Health (SDOH) Interventions     Readmission Risk Interventions No flowsheet data found.

## 2020-08-12 DIAGNOSIS — U071 COVID-19: Secondary | ICD-10-CM | POA: Diagnosis not present

## 2020-08-12 LAB — CBC WITH DIFFERENTIAL/PLATELET
Abs Immature Granulocytes: 0.18 10*3/uL — ABNORMAL HIGH (ref 0.00–0.07)
Basophils Absolute: 0 10*3/uL (ref 0.0–0.1)
Basophils Relative: 0 %
Eosinophils Absolute: 0 10*3/uL (ref 0.0–0.5)
Eosinophils Relative: 0 %
HCT: 41.5 % (ref 36.0–46.0)
Hemoglobin: 14 g/dL (ref 12.0–15.0)
Immature Granulocytes: 1 %
Lymphocytes Relative: 8 %
Lymphs Abs: 1.2 10*3/uL (ref 0.7–4.0)
MCH: 27.2 pg (ref 26.0–34.0)
MCHC: 33.7 g/dL (ref 30.0–36.0)
MCV: 80.7 fL (ref 80.0–100.0)
Monocytes Absolute: 0.7 10*3/uL (ref 0.1–1.0)
Monocytes Relative: 5 %
Neutro Abs: 12.9 10*3/uL — ABNORMAL HIGH (ref 1.7–7.7)
Neutrophils Relative %: 86 %
Platelets: 395 10*3/uL (ref 150–400)
RBC: 5.14 MIL/uL — ABNORMAL HIGH (ref 3.87–5.11)
RDW: 13.3 % (ref 11.5–15.5)
WBC: 15 10*3/uL — ABNORMAL HIGH (ref 4.0–10.5)
nRBC: 0 % (ref 0.0–0.2)

## 2020-08-12 LAB — COMPREHENSIVE METABOLIC PANEL
ALT: 24 U/L (ref 0–44)
AST: 21 U/L (ref 15–41)
Albumin: 2.5 g/dL — ABNORMAL LOW (ref 3.5–5.0)
Alkaline Phosphatase: 63 U/L (ref 38–126)
Anion gap: 8 (ref 5–15)
BUN: 46 mg/dL — ABNORMAL HIGH (ref 8–23)
CO2: 26 mmol/L (ref 22–32)
Calcium: 8.6 mg/dL — ABNORMAL LOW (ref 8.9–10.3)
Chloride: 104 mmol/L (ref 98–111)
Creatinine, Ser: 0.73 mg/dL (ref 0.44–1.00)
GFR, Estimated: >60 mL/min (ref >60)
Glucose, Bld: 198 mg/dL — ABNORMAL HIGH (ref 70–99)
Potassium: 4.4 mmol/L (ref 3.5–5.1)
Sodium: 138 mmol/L (ref 135–145)
Total Bilirubin: 0.5 mg/dL (ref 0.3–1.2)
Total Protein: 5.6 g/dL — ABNORMAL LOW (ref 6.5–8.1)

## 2020-08-12 LAB — PROTIME-INR
INR: 2 — ABNORMAL HIGH (ref 0.8–1.2)
Prothrombin Time: 22.5 seconds — ABNORMAL HIGH (ref 11.4–15.2)

## 2020-08-12 LAB — D-DIMER, QUANTITATIVE: D-Dimer, Quant: <0.27 ug/mL-FEU (ref 0.00–0.50)

## 2020-08-12 LAB — C-REACTIVE PROTEIN: CRP: 1.1 mg/dL — ABNORMAL HIGH (ref <1.0)

## 2020-08-12 LAB — GLUCOSE, CAPILLARY
Glucose-Capillary: 157 mg/dL — ABNORMAL HIGH (ref 70–99)
Glucose-Capillary: 233 mg/dL — ABNORMAL HIGH (ref 70–99)

## 2020-08-12 MED ORDER — RISAQUAD PO CAPS: 2.0000 | ORAL_CAPSULE | Freq: Three times a day (TID) | ORAL | 0 refills | Status: DC

## 2020-08-12 MED ORDER — DOXYCYCLINE HYCLATE 50 MG PO CAPS: 100.0000 mg | ORAL_CAPSULE | Freq: Two times a day (BID) | ORAL | 0 refills | Status: AC

## 2020-08-12 MED ORDER — ADULT MULTIVITAMIN W/MINERALS CH: 1.0000 | ORAL_TABLET | Freq: Every day | ORAL | 0 refills | Status: DC

## 2020-08-12 MED ORDER — WARFARIN SODIUM 2 MG PO TABS
2.0000 mg | ORAL_TABLET | Freq: Once | ORAL | Status: DC
  Filled 2020-08-12: qty 1

## 2020-08-12 MED ORDER — CEPHALEXIN 500 MG PO CAPS: 500.0000 mg | ORAL_CAPSULE | Freq: Three times a day (TID) | ORAL | 0 refills | Status: AC

## 2020-08-12 MED ORDER — CARVEDILOL 3.125 MG PO TABS: 3.1250 mg | ORAL_TABLET | Freq: Two times a day (BID) | ORAL | 1 refills | Status: DC

## 2020-08-12 MED ORDER — LANTUS SOLOSTAR 100 UNIT/ML ~~LOC~~ SOPN: 15.0000 [IU] | PEN_INJECTOR | Freq: Every evening | SUBCUTANEOUS | 11 refills | Status: DC

## 2020-08-12 MED ORDER — DEXAMETHASONE 6 MG PO TABS: 6.0000 mg | ORAL_TABLET | ORAL | 0 refills | Status: DC

## 2020-08-12 MED ORDER — WARFARIN SODIUM 2 MG PO TABS: 2.0000 mg | ORAL_TABLET | Freq: Every day | ORAL | 1 refills | Status: DC

## 2020-08-12 NOTE — Discharge Instructions (Signed)
You need to have INR (coumadin level check) Monday or Tuesday.   Your Insulin dose was increase while you are on dexamethasone. Go back to prior insulin regimen when you finish dexamethasone.   Follow up with your cardiologist to discuss further treatments options for Heart FailureSherryll Burger)   You Need to Quarantine until June 2.    10 Things You Can Do to Manage Your COVID-19 Symptoms at Home If you have possible or confirmed COVID-19: 1. Stay home except to get medical care. 2. Monitor your symptoms carefully. If your symptoms get worse, call your healthcare provider immediately. 3. Get rest and stay hydrated. 4. If you have a medical appointment, call the healthcare provider ahead of time and tell them that you have or may have COVID-19. 5. For medical emergencies, call 911 and notify the dispatch personnel that you have or may have COVID-19. 6. Cover your cough and sneezes with a tissue or use the inside of your elbow. 7. Wash your hands often with soap and water for at least 20 seconds or clean your hands with an alcohol-based hand sanitizer that contains at least 60% alcohol. 8. As much as possible, stay in a specific room and away from other people in your home. Also, you should use a separate bathroom, if available. If you need to be around other people in or outside of the home, wear a mask. 9. Avoid sharing personal items with other people in your household, like dishes, towels, and bedding. 10. Clean all surfaces that are touched often, like counters, tabletops, and doorknobs. Use household cleaning sprays or wipes according to the label instructions. [email or web address] 10/07/2019 This information is not intended to replace advice given to you by your health care provider. Make sure you discuss any questions you have with your health care provider. Document Revised: 01/23/2020 Document Reviewed: 01/23/2020 Elsevier Patient Education  2021 [employer].

## 2020-08-12 NOTE — Discharge Summary (Signed)
Physician Discharge Summary  Deborah Dawson XNT:[id number] DOB: 1950/12/19 DOA: 08/08/2020  PCP: Barbette Reichmann, MD  Admit date: 08/08/2020 Discharge date: 08/12/2020  Admitted From: Home  Disposition: Home   Recommendations for Outpatient Follow-up:  1. Follow up with PCP in 1-2 weeks 2. Please obtain BMP/CBC in one week 3. Needs INR check in 2 days, adjust coumadin as needed.  4. Needs to follow up with cardiologist for consideration of Entresto, ICD evaluation.    Home Health: Yes.   Discharge Condition: Stable.  CODE STATUS: Full code Diet recommendation: Heart Healthy  Brief/Interim Summary: A. fib, diabetes, psoriasis, who presents with worsening shortness of breath, worsening cough.  She was diagnosed with COVID on 07/31/2020.  She was treated as an outpatient with prednisone and albuterol.  She returned to the ED with worsening shortness of breath and cough, she was found to be hypoxic.  Chest x-ray showed new patchy airspace opacity in the right lower lung, concerning for pneumonia.   1-COVID-19 pneumonia, Acute hypoxic respiratory failure: Patient presented with leukocytosis, pro-calcitoni less than 0.10. plan to cycle pro-calcitonin.  Continue with Ceftriaxone and Doxy  total of 5 days treatment.  Continue with Remdesivir day 5 and dexamethasone, plan to discharge on Dexamethasone to complete 10 days.  Prone position.  COVID-19 Labs  Recent Labs (last 2 labs)        Recent Labs    08/09/20 0446 08/10/20 0653 08/11/20 0530  DDIMER 0.27 0.41 0.28  CRP 11.3* 5.5*  --      Covid positive PCR [place].  Improving.    2-History of A. Fib: Resume carvedilol and diltiazem. Monitor.  Coumadin per pharmacy.  Supratherapeutic INR on admission Plan to hold Coumadin. Resume  digoxin QRS prolong last night, Mg normal, Digoxin NL.  Patient will be discharge on 2 mg coumadin. Close follow up on level.   3-Hypertension malignant: Resume clonidine, Cardizem and  carvedilol  4-Diabetes Type 2, Hyperglycemia,  Increase levemir 15 units.  SSI. Meals coverage. Discharge on increase dose insulin, while she is on dexamethasone.   5-Chronic Systolic Heart failure , Prior EF 25% 2017.  ECHO. Improve EF 35 % 07/2020. Resume low dose lasix.  Needs follow up with cardiology, consider Entresto, ICD.   Estimated body mass index is 21.83 kg/m as calculated from the following:   Height as of this encounter: 5\' 1"  (1.549 m).   Weight as of this encounter: 52.4 kg.      Discharge Diagnoses:  Active Problems:   Acute respiratory failure with hypoxia (HCC)   Malignant essential hypertension   Atrial fibrillation with RVR (HCC)   COVID-19 with multiple comorbidities   Malnutrition of moderate degree    Discharge Instructions  Discharge Instructions    Diet - low sodium heart healthy   Complete by: As directed    Increase activity slowly   Complete by: As directed      Allergies as of 08/12/2020      Reactions   Doxazosin Other (See Comments)   Cardura - cough   Doxycycline    Abdominal pain   Drug Class [clindamycin/lincomycin] Hives   Hydralazine Hcl Other (See Comments)   gout   Metformin And Related Swelling   Ciprofloxacin Other (See Comments)   Numbness in face   Iodine Other (See Comments)   Augmentin [amoxicillin-pot Clavulanate] Diarrhea   Dulaglutide Nausea Only   Hctz [hydrochlorothiazide] Other (See Comments)   Gout   Losartan Diarrhea   Poison Oak Extract Rash  Red Dye Rash      Medication List    STOP taking these medications   ketorolac 0.5 % ophthalmic solution Commonly known as: ACULAR   ofloxacin 0.3 % ophthalmic solution Commonly known as: OCUFLOX   prednisoLONE acetate 1 % ophthalmic suspension Commonly known as: PRED FORTE   predniSONE 10 MG tablet Commonly known as: DELTASONE   predniSONE 20 MG tablet Commonly known as: DELTASONE     TAKE these medications   acidophilus Caps capsule Take 2  capsules by mouth 3 (three) times daily.   albuterol 108 (90 Base) MCG/ACT inhaler Commonly known as: VENTOLIN HFA Inhale 2 puffs into the lungs every 4 (four) hours as needed for shortness of breath.   CALCIUM 500+D PO Take 1 tablet by mouth daily.   carvedilol 3.125 MG tablet Commonly known as: COREG Take 1 tablet (3.125 mg total) by mouth 2 (two) times daily with a meal. What changed:   medication strength  how much to take   cephALEXin 500 MG capsule Commonly known as: KEFLEX Take 1 capsule (500 mg total) by mouth 3 (three) times daily for 3 days.   cloNIDine 0.2 MG tablet Commonly known as: CATAPRES Take 0.2 mg by mouth 3 (three) times daily.   colchicine 0.6 MG tablet Take 0.6-1.8 mg by mouth as directed.   dexamethasone 6 MG tablet Commonly known as: DECADRON Take 1 tablet (6 mg total) by mouth daily.   digoxin 0.25 MG tablet Commonly known as: LANOXIN Take 1 tablet (0.25 mg total) by mouth daily.   diltiazem 120 MG 24 hr capsule Commonly known as: TIAZAC Take 120 mg by mouth daily.   doxycycline 50 MG capsule Commonly known as: VIBRAMYCIN Take 2 capsules (100 mg total) by mouth 2 (two) times daily for 2 days.   fluticasone 50 MCG/ACT nasal spray Commonly known as: FLONASE Place 1 spray into both nostrils 2 (two) times daily.   furosemide 20 MG tablet Commonly known as: LASIX Take 10 mg by mouth daily.   glimepiride 4 MG tablet Commonly known as: AMARYL Take 4 mg by mouth 2 (two) times daily.   guaiFENesin-codeine 100-10 MG/5ML syrup Take 5 mLs by mouth every 4 (four) hours as needed for cough.   Lantus SoloStar 100 UNIT/ML Solostar Pen Generic drug: insulin glargine Inject 15 Units into the skin at bedtime. What changed: how much to take   Magnesium Gluconate 550 MG Tabs Take by mouth.   multivitamin with minerals Tabs tablet Take 1 tablet by mouth daily. Start taking on: Aug 13, 2020   Ozempic (0.25 or 0.5 MG/DOSE) 2 MG/1.5ML  Sopn Generic drug: Semaglutide(0.25 or 0.5MG /DOS) Inject into the skin.   PEN NEEDLES 31GX5/16" 31G X 8 MM Misc Use 2 (two) times daily   Turmeric 500 MG Caps Take 1 capsule by mouth daily.   Vitamin D (Ergocalciferol) 50 MCG (2000 UT) Caps Take 1 capsule by mouth daily.   warfarin 2 MG tablet Commonly known as: COUMADIN Take 1 tablet (2 mg total) by mouth daily at 4 PM. What changed:   how much to take  when to take this  Another medication with the same name was removed. Continue taking this medication, and follow the directions you see here.       Allergies  Allergen Reactions  . Doxazosin Other (See Comments)    Cardura - cough  . Doxycycline     Abdominal pain  . Drug Class [Clindamycin/Lincomycin] Hives  . Hydralazine Hcl Other (See Comments)  gout  . Metformin And Related Swelling  . Ciprofloxacin Other (See Comments)    Numbness in face  . Iodine Other (See Comments)  . Augmentin [Amoxicillin-Pot Clavulanate] Diarrhea  . Dulaglutide Nausea Only  . Hctz [Hydrochlorothiazide] Other (See Comments)    Gout  . Losartan Diarrhea  . Poison Oak Extract Rash  . Red Dye Rash    Consultations:  None   Procedures/Studies: DG Chest 2 View  Result Date: 08/08/2020 CLINICAL DATA:  Shortness of breath. EXAM: CHEST - 2 VIEW COMPARISON:  10/25/2015. FINDINGS: New patchy airspace opacities in the right lower lung. No visible pleural effusions or pneumothorax. Similar cardiomediastinal silhouette. Exaggerated thoracic kyphosis and reverse S-shaped thoracolumbar curvature. Cholecystectomy clips. IMPRESSION: New patchy airspace opacities in the right lower lung, concerning for pneumonia. Electronically Signed   By: Feliberto HartsFrederick S Jones MD   On: 08/08/2020 09:46   ECHOCARDIOGRAM COMPLETE  Result Date: 08/11/2020    ECHOCARDIOGRAM REPORT   Patient Name:   Deborah Dawson Date of Exam: 08/10/2020 Medical Rec #:  [id number]    Height:       61.0 in Accession #:    [id number]    Weight:       115.5 lb Date of Birth:  22-Nov-1950    BSA:          1.496 m Patient Age:    69 years     BP:           155/71 mmHg Patient Gender: F            HR:           86 bpm. Exam Location:  [facility] Procedure: 2D Echo, Cardiac Doppler and Color Doppler Indications:     Dyspnea R06.00  History:         Patient has prior history of Echocardiogram examinations, most                  recent 06/25/2015. Arrythmias:Atrial Fibrillation; Risk                  Factors:Hypertension and Diabetes. H/O cardiac cath.  Sonographer:     Cristela BlueJerry Hege RDCS (AE) Referring Phys:  [id number] Alba CoryBELKYS A Alice Vitelli Diagnosing Phys: Alwyn Peawayne D Callwood MD  Sonographer Comments: Suboptimal parasternal window. IMPRESSIONS  1. Ant/apical /septal hypo     IVCD. Left ventricular ejection fraction, by estimation, is 30 to 35%. The left ventricle has moderately decreased function. The left ventricle demonstrates regional wall motion abnormalities (see scoring diagram/findings for description). The left ventricular internal cavity size was mildly to moderately dilated. Left ventricular diastolic parameters were normal.  2. Right ventricular systolic function is mildly reduced. The right ventricular size is mildly enlarged.  3. Left atrial size was moderately dilated.  4. Right atrial size was moderately dilated.  5. The mitral valve is grossly normal. Mild mitral valve regurgitation.  6. Tricuspid valve regurgitation is mild to moderate.  7. The aortic valve is normal in structure. Aortic valve regurgitation is not visualized. FINDINGS  Left Ventricle: Ant/apical /septal hypo IVCD. Left ventricular ejection fraction, by estimation, is 30 to 35%. The left ventricle has moderately decreased function. The left ventricle demonstrates regional wall motion abnormalities. The left ventricular internal cavity size was mildly to moderately dilated. There is no left ventricular hypertrophy. Abnormal (paradoxical) septal motion, consistent with left bundle branch block.  Left ventricular diastolic parameters were normal. Right Ventricle: The right ventricular size is mildly enlarged. No increase in right ventricular wall  thickness. Right ventricular systolic function is mildly reduced. Left Atrium: Left atrial size was moderately dilated. Right Atrium: Right atrial size was moderately dilated. Pericardium: There is no evidence of pericardial effusion. Mitral Valve: The mitral valve is grossly normal. There is mild thickening of the mitral valve leaflet(s). There is mild calcification of the mitral valve leaflet(s). Mild mitral annular calcification. Mild mitral valve regurgitation. Tricuspid Valve: The tricuspid valve is normal in structure. Tricuspid valve regurgitation is mild to moderate. Aortic Valve: The aortic valve is normal in structure. Aortic valve regurgitation is not visualized. Aortic valve mean gradient measures 3.0 mmHg. Aortic valve peak gradient measures 6.9 mmHg. Aortic valve area, by VTI measures 2.50 cm. Pulmonic Valve: The pulmonic valve was grossly normal. Pulmonic valve regurgitation is not visualized. Aorta: The ascending aorta was not well visualized. IAS/Shunts: No atrial level shunt detected by color flow Doppler.  LEFT VENTRICLE PLAX 2D LVIDd:         3.10 cm LVIDs:         2.60 cm LV PW:         1.20 cm LV IVS:        1.05 cm LVOT diam:     2.00 cm LV SV:         46 LV SV Index:   30 LVOT Area:     3.14 cm  RIGHT VENTRICLE RV Basal diam:  2.70 cm RV S prime:     10.70 cm/s TAPSE (M-mode): 3.5 cm LEFT ATRIUM              Index       RIGHT ATRIUM           Index LA diam:        3.80 cm  2.54 cm/m  RA Area:     16.90 cm LA Vol (A2C):   80.4 ml  53.75 ml/m RA Volume:   49.10 ml  32.83 ml/m LA Vol (A4C):   105.0 ml 70.20 ml/m LA Biplane Vol: 92.9 ml  62.11 ml/m  AORTIC VALVE AV Area (Vmax):    2.04 cm AV Area (Vmean):   2.09 cm AV Area (VTI):     2.50 cm AV Vmax:           131.00 cm/s AV Vmean:          79.400 cm/s AV VTI:            0.182 m AV  Peak Grad:      6.9 mmHg AV Mean Grad:      3.0 mmHg LVOT Vmax:         84.90 cm/s LVOT Vmean:        52.800 cm/s LVOT VTI:          0.145 m LVOT/AV VTI ratio: 0.80  AORTA Ao Root diam: 2.70 cm MITRAL VALVE               TRICUSPID VALVE MV Area (PHT): 4.77 cm    TR Peak grad:   31.6 mmHg MV Decel Time: 159 msec    TR Vmax:        281.00 cm/s MV E velocity: 99.00 cm/s                            SHUNTS                            Systemic  VTI:  0.14 m                            Systemic Diam: 2.00 cm Dwayne Salome Arnt MD Electronically signed by Alwyn Pea MD Signature Date/Time: 08/11/2020/11:25:17 AM    Final       Subjective: She is feeling better , dyspnea improved  Discharge Exam: Vitals:   08/12/20 0618 08/12/20 0749  BP: (!) 184/99 (!) 182/94  Pulse: 82 64  Resp: 18 14  Temp: 97.6 F (36.4 C) 97.7 F (36.5 C)  SpO2: 99% 100%     General: Pt is alert, awake, not in acute distress Cardiovascular: RRR, S1/S2 +, no rubs, no gallops Respiratory: CTA bilaterally, no wheezing, no rhonchi Abdominal: Soft, NT, ND, bowel sounds + Extremities: no edema, no cyanosis    The results of significant diagnostics from this hospitalization (including imaging, microbiology, ancillary and laboratory) are listed below for reference.     Microbiology: Recent Results (from the past 240 hour(s))  Blood Culture (routine x 2)     Status: None (Preliminary result)   Collection Time: 08/08/20 10:05 AM   Specimen: BLOOD  Result Value Ref Range Status   Specimen Description BLOOD RIGHT ANTECUBITAL  Final   Special Requests   Final    BOTTLES DRAWN AEROBIC AND ANAEROBIC Blood Culture adequate volume   Culture   Final    NO GROWTH 4 DAYS Performed at [facility], [address], [place], [place] [address]    Report Status PENDING  Incomplete  Blood Culture (routine x 2)     Status: None (Preliminary result)   Collection Time: 08/08/20 10:31 AM   Specimen: BLOOD  Result Value Ref  Range Status   Specimen Description BLOOD RIGHT WRIST  Final   Special Requests   Final    BOTTLES DRAWN AEROBIC AND ANAEROBIC Blood Culture adequate volume   Culture   Final    NO GROWTH 4 DAYS Performed at [facility], [address], [place], [place] [address]    Report Status PENDING  Incomplete     Labs: BNP (last 3 results) No results for input(s): BNP in the last 8760 hours. Basic Metabolic Panel: Recent Labs  Lab 08/08/20 0903 08/09/20 0446 08/10/20 0653 08/11/20 0530 08/12/20 0508  NA 133* 136 137 136 138  K 4.4 4.0 4.2 4.7 4.4  CL 94* 97* 100 101 104  CO2 27 27 27 27 26   GLUCOSE 338* 319* 271* 200* 198*  BUN 26* 36* 51* 52* 46*  CREATININE 0.75 0.88 0.81 0.72 0.73  CALCIUM 9.3 8.9 8.9 8.7* 8.6*  MG  --   --  2.0  --   --    Liver Function Tests: Recent Labs  Lab 08/09/20 0446 08/10/20 0653 08/11/20 0530 08/12/20 0508  AST 14* 22 22 21   ALT 19 17 23 24   ALKPHOS 98 80 70 63  BILITOT 0.6 0.7 0.8 0.5  PROT 7.0 6.0* 6.0* 5.6*  ALBUMIN 2.8* 2.6* 2.5* 2.5*   No results for input(s): LIPASE, AMYLASE in the last 168 hours. No results for input(s): AMMONIA in the last 168 hours. CBC: Recent Labs  Lab 08/08/20 0903 08/09/20 0446 08/10/20 0653 08/11/20 0530 08/12/20 0508  WBC 21.2* 27.0* 25.0* 17.9* 15.0*  NEUTROABS  --  23.6* 21.8* 15.5* 12.9*  HGB 15.6* 14.9 14.0 14.2 14.0  HCT 45.7 44.2 41.5 41.2 41.5  MCV 80.7 82.5 82.7 81.4 80.7  PLT 467* 492* 477* 429* 395   Cardiac Enzymes: No results for input(s): CKTOTAL, CKMB, CKMBINDEX, TROPONINI in the last 168 hours. BNP: Invalid input(s): POCBNP CBG: Recent Labs  Lab 08/11/20 0757 08/11/20 1228 08/11/20 1633 08/11/20 2038 08/12/20 0751  GLUCAP 206* 269* 280* 232* 157*   D-Dimer Recent Labs    08/11/20 0530 08/12/20 0508  DDIMER 0.28 <0.27   Hgb A1c No results for input(s): HGBA1C in the last 72 hours. Lipid Profile No results for input(s): CHOL, HDL, LDLCALC, TRIG, CHOLHDL,  LDLDIRECT in the last 72 hours. Thyroid function studies No results for input(s): TSH, T4TOTAL, T3FREE, THYROIDAB in the last 72 hours.  Invalid input(s): FREET3 Anemia work up No results for input(s): VITAMINB12, FOLATE, FERRITIN, TIBC, IRON, RETICCTPCT in the last 72 hours. Urinalysis    Component Value Date/Time   APPEARANCEUR Hazy (A) 04/19/2020 1411   GLUCOSEU 1+ (A) 04/19/2020 1411   BILIRUBINUR Negative 04/19/2020 1411   PROTEINUR 1+ (A) 04/19/2020 1411   NITRITE Negative 04/19/2020 1411   LEUKOCYTESUR 1+ (A) 04/19/2020 1411   Sepsis Labs Invalid input(s): PROCALCITONIN,  WBC,  LACTICIDVEN Microbiology Recent Results (from the past 240 hour(s))  Blood Culture (routine x 2)     Status: None (Preliminary result)   Collection Time: 08/08/20 10:05 AM   Specimen: BLOOD  Result Value Ref Range Status   Specimen Description BLOOD RIGHT ANTECUBITAL  Final   Special Requests   Final    BOTTLES DRAWN AEROBIC AND ANAEROBIC Blood Culture adequate volume   Culture   Final    NO GROWTH 4 DAYS Performed at [facility], [address] [place], [place] [address]    Report Status PENDING  Incomplete  Blood Culture (routine x 2)     Status: None (Preliminary result)   Collection Time: 08/08/20 10:31 AM   Specimen: BLOOD  Result Value Ref Range Status   Specimen Description BLOOD RIGHT WRIST  Final   Special Requests   Final    BOTTLES DRAWN AEROBIC AND ANAEROBIC Blood Culture adequate volume   Culture   Final    NO GROWTH 4 DAYS Performed at [facility], [address] [place], [place] [address]    Report Status PENDING  Incomplete     Time coordinating discharge: 40 minutes  SIGNED:   Alba Cory, MD  [facility]

## 2020-08-12 NOTE — Consult Note (Addendum)
ANTICOAGULATION CONSULT NOTE  Pharmacy Consult for warfarin Indication: atrial fibrillation  Allergies  Allergen Reactions  . Doxazosin Other (See Comments)    Cardura - cough  . Doxycycline     Abdominal pain  . Drug Class [Clindamycin/Lincomycin] Hives  . Hydralazine Hcl Other (See Comments)    gout  . Metformin And Related Swelling  . Ciprofloxacin Other (See Comments)    Numbness in face  . Iodine Other (See Comments)  . Augmentin [Amoxicillin-Pot Clavulanate] Diarrhea  . Dulaglutide Nausea Only  . Hctz [Hydrochlorothiazide] Other (See Comments)    Gout  . Losartan Diarrhea  . Poison Oak Extract Rash  . Red Dye Rash    Patient Measurements: Height: 5\' 1"  (154.9 cm) Weight: 52.4 kg (115 lb 8.3 oz) IBW/kg (Calculated) : 47.8  Vital Signs: Temp: 97.7 F (36.5 C) (05/22 0749) Temp Source: Oral (05/22 0749) BP: 182/94 (05/22 0749) Pulse Rate: 64 (05/22 0749)  Labs: Recent Labs    08/10/20 0653 08/11/20 0530 08/12/20 0508  HGB 14.0 14.2 14.0  HCT 41.5 41.2 41.5  PLT 477* 429* 395  LABPROT 30.8* 26.5* 22.5*  INR 3.0* 2.4* 2.0*  CREATININE 0.81 0.72 0.73    Estimated Creatinine Clearance: 48.7 mL/min (by C-G formula based on SCr of 0.73 mg/dL).   Medical History: Past Medical History:  Diagnosis Date  . Allergic genetic state   . Atrial fibrillation (HCC) 2010  . Cardiomyopathy (HCC)   . CHF (congestive heart failure) (HCC)   . Diabetes mellitus without complication (HCC)   . Dysrhythmia   . Gout   . H/O cardiac catheterization   . Hypertension   . Joint pain   . Osteopenia   . Psoriasis     Medications:  PTA warfarin dose 2.5mg  qd - clarified by medication reconciliation - last home dose 5/17  Patient has received 2 days of azithromycin (5/18-5/19)  Monitor for toxic effects of coumarin derivatives if a tetracycline derivative is initiated/dose increased (doxycycline started today 5/20)  Cephalosporins may act to enhance anticoagulant  effects of vitamin K antagonists (ceftriaxone started 5/18)  Assessment: 70 yo female with a history of atrial fibrillation on warfarin presenting with worsening SOB and cough, being treated for COVID PNA, malignant HTN, and DM2.    Pharmacy is consulted to monitor INR and restart warfarin therapy when appropriate.  DDI: digoxin (home med)   INR Warfarin dose 05/18 5.2 Held 05/19 4.8 Held 05/20  3.0 2 mg 05/21 2.4 2 mg 5/22 2  Goal of Therapy:  INR 2-3 Monitor platelets by anticoagulation protocol: Yes   Plan:  INR is therapeutic but at lower limit of goal INR trending down due to held doses, hopefully INR will trend back up tomorrow. Will order warfarin 2 mg x 1 tonight (lower dose from home dose due to pt on abx therapy and pt was supratherapeutic on home dose). Daily INR ordered. CBC at least every 3 days.   6/22, PharmD, BCPS Clinical Pharmacist 08/12/2020 8:27 AM

## 2020-08-12 NOTE — Progress Notes (Signed)
Removed IV before discharge. Went over discharge instructions as well as medications with patient. Prescription was given to patient. All questions answered and patient stated that she understood. Patient going home POV with husband.

## 2020-08-13 LAB — CULTURE, BLOOD (ROUTINE X 2)
Culture: NO GROWTH
Culture: NO GROWTH
Special Requests: ADEQUATE
Special Requests: ADEQUATE

## 2021-02-04 ENCOUNTER — Other Ambulatory Visit: Payer: Self-pay | Admitting: Internal Medicine

## 2021-02-04 DIAGNOSIS — Z1231 Encounter for screening mammogram for malignant neoplasm of breast: Secondary | ICD-10-CM

## 2021-02-06 ENCOUNTER — Other Ambulatory Visit: Payer: Self-pay

## 2021-02-06 ENCOUNTER — Ambulatory Visit
Admission: RE | Admit: 2021-02-06 | Discharge: 2021-02-06 | Disposition: A | Discharge status: Home / Self Care | Payer: Medicare Other | Source: Ambulatory Visit | Attending: Internal Medicine | Admitting: Internal Medicine

## 2021-02-06 DIAGNOSIS — Z1231 Encounter for screening mammogram for malignant neoplasm of breast: Secondary | ICD-10-CM | POA: Diagnosis not present

## 2021-07-22 ENCOUNTER — Emergency Department
Admission: EM | Admit: 2021-07-22 | Discharge: 2021-07-22 | Disposition: A | Discharge status: Home / Self Care | Payer: Medicare Other | Attending: Emergency Medicine | Admitting: Emergency Medicine

## 2021-07-22 ENCOUNTER — Other Ambulatory Visit: Payer: Self-pay

## 2021-07-22 DIAGNOSIS — W450XXA Nail entering through skin, initial encounter: Secondary | ICD-10-CM | POA: Insufficient documentation

## 2021-07-22 DIAGNOSIS — I509 Heart failure, unspecified: Secondary | ICD-10-CM | POA: Diagnosis not present

## 2021-07-22 DIAGNOSIS — I4891 Unspecified atrial fibrillation: Secondary | ICD-10-CM | POA: Insufficient documentation

## 2021-07-22 DIAGNOSIS — Z7901 Long term (current) use of anticoagulants: Secondary | ICD-10-CM | POA: Diagnosis not present

## 2021-07-22 DIAGNOSIS — S99921A Unspecified injury of right foot, initial encounter: Secondary | ICD-10-CM | POA: Diagnosis present

## 2021-07-22 DIAGNOSIS — I10 Essential (primary) hypertension: Secondary | ICD-10-CM

## 2021-07-22 DIAGNOSIS — S91111A Laceration without foreign body of right great toe without damage to nail, initial encounter: Secondary | ICD-10-CM | POA: Insufficient documentation

## 2021-07-22 DIAGNOSIS — I110 Hypertensive heart disease with heart failure: Secondary | ICD-10-CM | POA: Diagnosis not present

## 2021-07-22 DIAGNOSIS — E119 Type 2 diabetes mellitus without complications: Secondary | ICD-10-CM | POA: Insufficient documentation

## 2021-07-22 MED ORDER — CLONIDINE HCL 0.1 MG PO TABS
0.2000 mg | ORAL_TABLET | Freq: Three times a day (TID) | ORAL | Status: DC
  Administered 2021-07-22: 0.2 mg via ORAL
  Filled 2021-07-22: qty 2

## 2021-07-22 MED ORDER — MICROFIBRILLAR COLL HEMOSTAT EX POWD
1.0000 g | Freq: Once | CUTANEOUS | Status: AC
  Administered 2021-07-22: 1 g via TOPICAL
  Filled 2021-07-22: qty 5

## 2021-07-22 NOTE — ED Triage Notes (Signed)
Pt presents via POV c/o right great toe bleeding. Reports pulled on nail while clipping toe nail. Reports unable to stop bleeding.  ?

## 2021-07-22 NOTE — ED Triage Notes (Signed)
Pt states on Coumadin.  ?

## 2021-07-22 NOTE — ED Provider Notes (Signed)
? ?[facility] ?Provider Note ? ? ? Event Date/Time  ? First MD Initiated Contact with Patient 07/22/21 2109   ?  (approximate) ? ? ?History  ? ?Toe Injury ? ? ?HPI ? ?Deborah Dawson is a 71 y.o. female with a past medical history of A-fib on Coumadin, CHF, DM, gout, HTN, osteopenia and psoriasis who presents for evaluation of a cut she sustained to the tip of her right first toe when she cut her nail too far back earlier this evening.  She states this occurred immediately prior to arrival and that she was able to get the bleeding stopped which is pressure.  No other injuries or recent bleeding.  She has no other acute sick symptoms including any pain in her foot, ankle, chest pain, cough, shortness of breath, headache, earache, sore throat fevers or any other acute concerns. ? ?  ?Past Medical History:  ?Diagnosis Date  ? Allergic genetic state   ? Atrial fibrillation (HCC) 2010  ? Cardiomyopathy (HCC)   ? CHF (congestive heart failure) (HCC)   ? Diabetes mellitus without complication (HCC)   ? Dysrhythmia   ? Gout   ? H/O cardiac catheterization   ? Hypertension   ? Joint pain   ? Osteopenia   ? Psoriasis   ? ? ? ?Physical Exam  ?Triage Vital Signs: ?ED Triage Vitals  ?Enc Vitals Group  ?   BP 07/22/21 2048 (!) 197/98  ?   Pulse Rate 07/22/21 2048 66  ?   Resp 07/22/21 2048 16  ?   Temp 07/22/21 2048 98.6 ?F (37 ?C)  ?   Temp Source 07/22/21 2048 Oral  ?   SpO2 07/22/21 2048 95 %  ?   Weight --   ?   Height --   ?   Head Circumference --   ?   Peak Flow --   ?   Pain Score 07/22/21 2054 0  ?   Pain Loc --   ?   Pain Edu? --   ?   Excl. in GC? --   ? ? ?Most recent vital signs: ?Vitals:  ? 07/22/21 2048  ?BP: (!) 197/98  ?Pulse: 66  ?Resp: 16  ?Temp: 98.6 ?F (37 ?C)  ?SpO2: 95%  ? ? ?General: Awake, no distress.  ?CV:  Good peripheral perfusion.  2+ DP pulses. ?Resp:  Normal effort.  ?Abd:  No distention.  ?Other:  Sensation is intact to light touch throughout the right foot.  There is a small  area of removed skin of the tuft of the tip of the first digit with a nail otherwise intact.  This is some scant oozing.  No other areas of bleeding or other trauma obvious to the toe or foot. ? ? ?ED Results / Procedures / Treatments  ?Labs ?(all labs ordered are listed, but only abnormal results are displayed) ?Labs Reviewed - No data to display ? ? ?EKG ? ? ?RADIOLOGY ? ? ?PROCEDURES: ? ?Critical Care performed: No ? ?Wound closure utilizing adhes only ? ?Date/Time: 07/22/2021 10:13 PM ?Performed by: Gilles Chiquito, MD ?Authorized by: Gilles Chiquito, MD  ?Consent: Verbal consent obtained. ?Consent given by: patient ?Patient understanding: patient states understanding of the procedure being performed ?Patient identity confirmed: verbally with patient ?Local anesthesia used: no ? ?Anesthesia: ?Local anesthesia used: no ? ?Sedation: ?Patient sedated: no ? ? ?Stasis achieved with Surgicel and bandage applied. ? ? ? ?MEDICATIONS ORDERED IN ED: ?Medications  ?cloNIDine (CATAPRES)  tablet 0.2 mg (0.2 mg Oral Given 07/22/21 2135)  ?microfibrillar collagen (AVITENE FLOUR) powder 1 g (1 g Topical Given 07/22/21 2136)  ? ? ? ?IMPRESSION / MDM / ASSESSMENT AND PLAN / ED COURSE  ?I reviewed the triage vital signs and the nursing notes. ?             ?               ? ?History exam is most consistent with a very superficial laceration versus abrasion where patient took off the tuft of skin of the tip of the right big toe while clipping her nail.  No other injuries or evidence of infection or bony trauma.  There is some persistent oozing which suspect is related to patient being on Coumadin.  She is also hypertensive but denies any other acute sick symptoms.  She was given a dose of her home BP meds today initial evaluation she had no states this evening.  Hemostasis was achieved with some topical Surgicel.  Dressing applied.  She has no other acute concerns at this time and have a low suspicion for other immediate  life-threatening process.  Discharged in stable condition. ? ?  ? ? ?FINAL CLINICAL IMPRESSION(S) / ED DIAGNOSES  ? ?Final diagnoses:  ?Laceration of right great toe without foreign body present or damage to nail, initial encounter  ?Hypertension, unspecified type  ?Anticoagulated  ? ? ? ?Rx / DC Orders  ? ?ED Discharge Orders   ? ? None  ? ?  ? ? ? ?Note:  This document was prepared using Dragon voice recognition software and may include unintentional dictation errors. ?  ?Gilles Chiquito, MD ?07/22/21 2215 ? ?

## 2021-07-31 NOTE — Discharge Summary (Signed)
 [facility]                      Electrophysiology Discharge Summary   Admit Date: 07/31/2021  Discharge Date: 08/01/2021  Admitting Physician: Franky Morna Ned, MD  Discharge Physician: Saturnino Sieving, MD  Primary Care Provider: Provider, External Referred To  Primary Electrophysiologist: Ned Franky Morna, MD  Discharge Destination: Home  Discharge Services: none  Code Status: Full Code   Admission Diagnoses:  Chronic systolic CHF (congestive heart failure), NYHA class 3 (CMS-HCC) [I50.22] Left bundle branch block [I44.7] Preprocedural cardiovascular examination [Z01.810] Long term current use of anticoagulants with INR goal of 2.0-3.0 [Z79.01]  Discharge Diagnoses:  Active Problems:   Chronic a-fib (CMS-HCC)   Essential (primary) hypertension   Chronic systolic CHF (congestive heart failure), NYHA class 3 (CMS-HCC) Resolved Problems:   * No resolved hospital problems. *     Anticipatory Guidance :  - Successful CRT-P  _INR check as previously scheduled    Cardiac Rehab: Not clinically indicated at this time  Patient Discharge Instructions:   If you smoke (or have smoked within the last year), we strongly recommend that you do not smoke.   Weigh yourself daily and record   Notify cardiology provider of chest pain   Notify provider of swelling in arms, legs, or stomach   Notify provider temperature greater than 101.0 F (38.3 C) degrees   Notify provider of weight gain greater than 2 lbs in 1 day or 5 lbs in 1 week   Report questions or concerns to the [facility] at [phone]   Notify primary care physician of other symptoms   For a life-threatening emergency, call 911   Follow-up with Primary Care Provider   Follow-up with Cardiology   Notify provider of bleeding, redness, swelling, pain, or drainage from surgical incision(s)   Notify provider of dizziness or passing out   Notify provider of  nausea or vomiting   Notify provider of difficulty breathing or shortness of breath   2-gram sodium   Other activity instructions:  Order Comments: Please follow further instructions on attached sheet titled, Pacemaker/Biventricular Pacemaker Implant: After Procedure Care    [facility] Provider Follow-up: Future Appointments  Date Time Provider Department Center  08/06/2021  9:00 AM [facility]  08/06/2021  9:15 AM [facility]  09/23/2021  8:15 AM [facility]  09/30/2021 11:15 AM Hande, Tamra Cal, MD [facility]  10/01/2021 10:45 AM Lawyer Bernardino Cough, MD [facility]  10/22/2021  2:00 PM Cockfield, Elsie Loving, PA [facility]  01/07/2022 10:30 AM Jane Delmar Pike, NP [facility]    Non-[facility] Provider Follow-up: none  Report Issues: By using [facility], or by calling the [facility] at [phone].  For urgent issues or after business hours (after 5pm on weekdays and anytime on weekends), call the [facility] Operator [phone] and ask to page the on-call cardiologist.    Allergies/Intolerances:  Allergies  Allergen Reactions  . Clindamycin Hives  . Hctz/Reserpine/Hydralazine [Hydralazine-Reserpin-Hcthiazid] Other (See Comments)    gout  . Amoxicillin-Pot Clavulanate Diarrhea  . Cardura [Doxazosin] Cough  . Ciprofloxacin Other (See Comments)    Numbness in face  . Losartan Diarrhea  . Metformin  Swelling  . Ozempic [Semaglutide]  Vomiting  . Poison Oak Extract Rash  . Trulicity [Dulaglutide] Nausea and Vomiting  . Doxycycline  Abdominal Pain  . Hydrochlorothiazide Other (See Comments)    Gout     Medications:     Discharge Medications     Modified Medications      Details  * warfarin 3 MG tablet Commonly known as: COUMADIN  What changed: additional instructions  Take as directed. If you are unsure how to take this medication, talk to  your nurse or doctor. Original instructions: 1 tab on Mondays, wed, Friday, Sat and Sunday Quantity: 20 tablet Refills: 5   * warfarin 2.5 MG tablet Commonly known as: COUMADIN  What changed: reasons to take this  Take as directed. If you are unsure how to take this medication, talk to your nurse or doctor. Original instructions: 2.5 mg, Oral, As Directed Quantity: 8 tablet Refills: 5       * There are duplicate medications prescribed to the patient          Medications To Continue      Details  carvediloL  6.25 MG tablet Commonly known as: COREG   TAKE 1 TABLET BY MOUTH TWICE A DAY WITH MEALS Quantity: 180 tablet Refills: 1   cephalexin  500 MG capsule Commonly known as: KEFLEX   500 mg, Oral, 3 times Daily Quantity: 30 capsule Refills: 0   cholecalciferol 1000 unit capsule Commonly known as: VITAMIN D3  1,000 Units, Oral, Daily Refills: 0   cloNIDine  HCL 0.2 MG tablet Commonly known as: CATAPRES   0.2 mg, Oral, 3 times Daily Quantity: 270 tablet Refills: 1   conjugated estrogens 0.625 mg/gram vaginal cream Commonly known as: PREMARIN  0.5 g, Vaginal, Twice Weekly, 1 applicator twice weekly Quantity: 42.5 g Refills: 3   digoxin  0.25 MG tablet Commonly known as: LANOXIN   0.25 mg, Oral, Daily Quantity: 90 tablet Refills: 3   FUROsemide  20 MG tablet Commonly known as: LASIX   20 mg, Oral, Daily Quantity: 90 tablet Refills: 3   insulin  LISPRO pen injector (concentration 100 units/mL) Commonly known as: HumaLOG KWIKPEN  10 Units, Subcutaneous, 3 times Daily CC (with meals) (INSULIN ) Quantity: 15 mL Refills: 11   LANTUS  SOLOSTAR U-100 INSULIN  pen injector (concentration 100 units/mL) Generic drug: insulin  GLARGINE  20 Units, Subcutaneous, Nightly Quantity: 3 mL Refills: 11   magnesium  30 mg tablet  30 mg, Oral, Nightly, As needed Refills: 0   pen needle, diabetic 31 gauge x 5/16 needle  Misc.(Non-Drug; Combo Route), 2 times Daily Quantity: 200  each Refills: 1   turmeric-turmeric root extract 450-50 mg Cap  1 capsule, Oral, As needed Refills: 0       Stopped Medications    fluocinolone acetonide 0.01 % otic drop Commonly known as: DERMOTIC   potassium 99 mg Tab          Anticoagulation: Prescribed INR goal: 2-3   Brief History of Present Illness: Per the H&P dated on 07/31/2021:  71 y.o. female with a history of non-iscemic cardiomyopathy that was diagnosed by echocardiogram (date:2017).  She was started on medical therapy at that time with beta blockers and lisinopril. She has been intolerant of  arbs and declined ARNI's and SGLTI's.   __________  Hospital Course by Problem:  On 07/31/2021, Dr. MARLA. Debby proceeded with implantation of biventricular pacemaker. Please see operative note for details. Patient tolerated the procedure well . Post procedure endorsed N/V in the setting of elevated BP trends into the 200s thought to be secondary to anesthesia since she has been  well controlled at home on her current regimen. No other associated symptoms. Required IV labetalol and hydralazine but able to resume oral medications with resolution of N/V. SBP trends into the 170s prior to discharge. 24 hour telemetry showed bi-ventricular paced. Patient aware to monitor her BP trends at home and follow up with PCP accordingly. Discharged to home in stable condition.     Imaging and Procedures Performed:  Device Implant:  [facility] Summary  Pacemakers Implanted BSCI/INTERVENTIONAL CARDIOLOGY PACEMAKER, VISIONIST X4 CRT-P [id number] S/N: [phone] Placement: Left infraclavicular subcutaneous     Leads Implanted - Acute BSCI/INTERVENTIONAL CARDIOLOGY LEAD, INGEVITY PLUS 52CM  S/N: [id number]  Lead type: New Lead Location: low RV septum Date of Implant: 07/31/2021  Implanted - Acute GUIDANT/[employer] CRM LEAD, CORON ACUITY X4 TINES [id number] S/N: [id number]  Lead type: New  Lead Location: CS lateral branch Date of Implant: 07/31/2021    Device Settings Mode: VVIR Lower Rate (bpm): 60 bpm    Upper Sensing Rate (bpm): 130  Device Interrogation:  Right Ventricular sensing/R wave:  25.0 mV  Right Ventricular lead impedance:  717 ohms  Right Ventricular threshold:  0.6 V at 0.4 ms  Left Ventricular sensing/R wave:  9.1 mV  Left Ventricular lead impedance:  1224 ohms  Left Ventricular threshold:  1.7 V at 1.0 ms   CXR PA/LAT on 5/11 showed stable device placement and no pneumothorax.  Reviewed by attending. _____________________  Discharge Exam:  Admission Weight: 61.4 kg (135 lb 5.8 oz)  Discharge Weight: Weight: 61.4 kg (135 lb 5.8 oz) BMI: Body mass index is 25.59 kg/m. BP (!) 170/81   Pulse 66   Temp 37.3 C (99.1 F) (Oral)   Resp 15   Ht 154.9 cm (5' 0.98)   Wt 61.4 kg (135 lb 5.8 oz)   LMP  (LMP Unknown)   SpO2 95%   BMI 25.59 kg/m   General: alert, cooperative and in NAD Respiratory: regular rate, symmetric, unlabored, clear to auscultation bilaterally and no accessory muscle use Cardiac: regular rate, regular rhythm, S1, S2 present, no murmur, no rub, no gallop and JVD non-elevated Chest: device is located in the left infra-clavicular fossa.  There is no erythema, erosion or discharge Abdomen: normal bowel sounds, soft, nontender and nondistended Extremities: extremities warm and well perfused, no clubbing or cyanosis, no edema and distal pulses intact  Lines: none  Pertinent Lab Testing:  BMP: Recent Labs  Lab 07/31/21 0720  NA 136  K 4.9  CL 102  CO2 26  BUN 26*  CREATININE 1.0  GLUCOSE 185*  CALCIUM 8.9  MG 1.9   CBC: Recent Labs  Lab 07/31/21 0720  WBC 11.3*  HGB 13.4  HCT 39.4  PLT 355   INR: Recent Labs  Lab 07/31/21 0720  INR 2.1*    TFTs: No results for input(s): TSH, T4FREE in the last 168 hours.       Other Pertinent Labs:  None  _____________________  Time spent on discharge process: >30  minutes  SASMRITA BELBASE, NP  Attestation Statement:   I personally saw the patient and performed a substantive portion of this encounter, including a complete performance of at least one of the key components (MDM, Hx and/or Exam), in conjunction with the Advanced Practice Provider for post operative management after CRT-P implant.  Notable post op nausea, likely anesthesia related. In this setting, intrinsic conduction precluded BiV frequently. AV node ablation may need to be considered in the future.  DANIEL FAIRY LI, MD

## 2021-08-14 DIAGNOSIS — Z950 Presence of cardiac pacemaker: Secondary | ICD-10-CM | POA: Insufficient documentation

## 2021-10-23 IMAGING — US US ABDOMEN COMPLETE W/ ELASTOGRAPHY
1 series · 12 of 25 positions shown · non-contrast
Comparison: None.

CLINICAL DATA: Portal hypertensive gastropathy.

EXAM:
ULTRASOUND ABDOMEN
ULTRASOUND HEPATIC ELASTOGRAPHY
TECHNIQUE: Sonography of the upper abdomen was performed. In addition,
ultrasound elastography evaluation of the liver was performed. A
region of interest was placed within the right lobe of the liver.
Following application of a compressive sonographic pulse, tissue
compressibility was assessed. Multiple assessments were performed at
the selected site. Median tissue compressibility was determined.
Previously, hepatic stiffness was assessed by shear wave velocity.
Based on recently published Society of Radiologists in Ultrasound
consensus article, reporting is now recommended to be performed in
the SI units of pressure (kiloPascals) representing hepatic
stiffness/elasticity. The obtained result is compared to the
published reference standards. (cACLD= compensated Advanced Chronic
Liver Disease)

[Series 1: us abdomen complete w/ elastography · 12 of 63 slices shown]
[im 3/63]
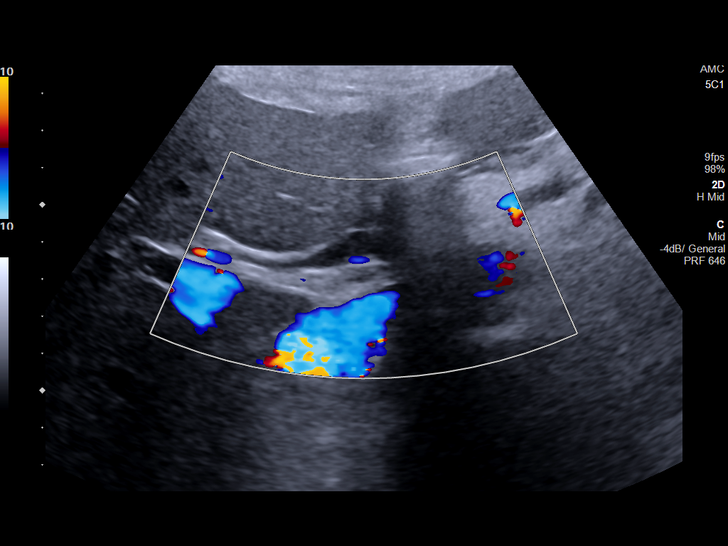
[im 8/63]
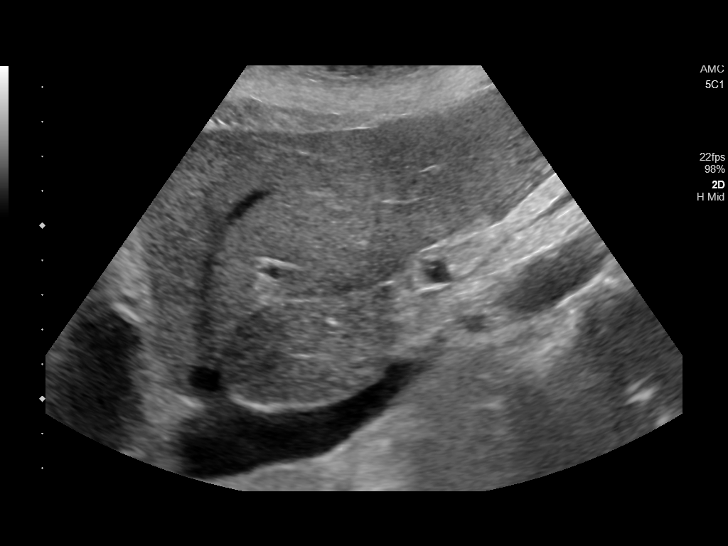
[im 13/63]
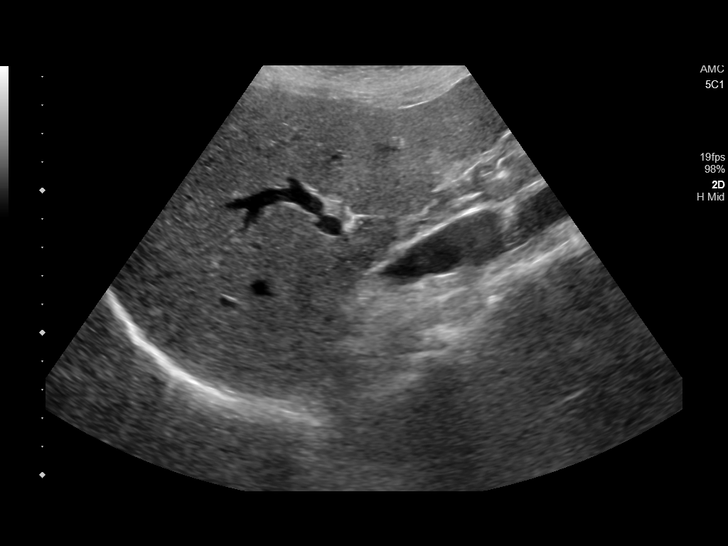
[im 19/63]
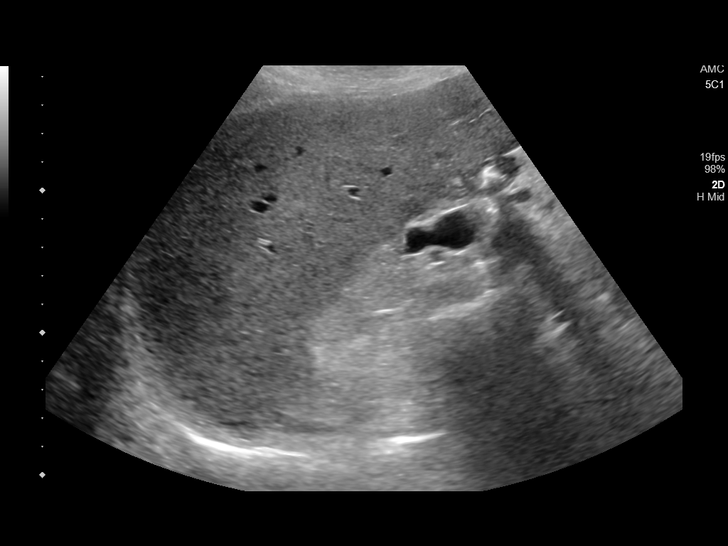
[im 24/63]
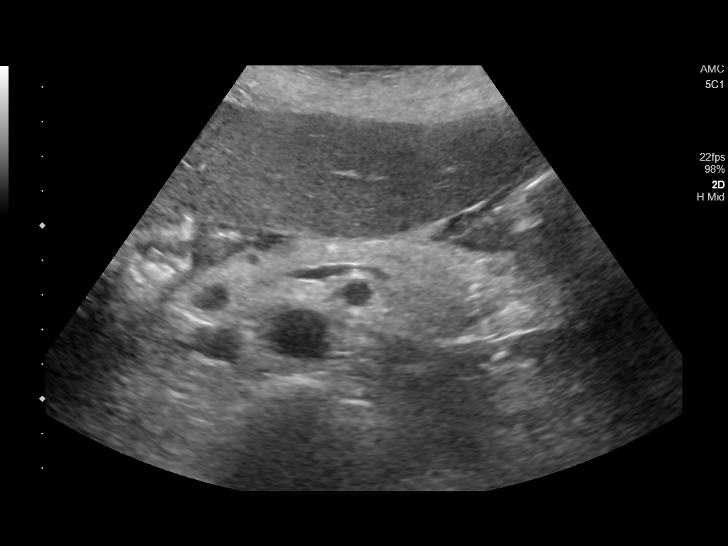
[im 29/63]
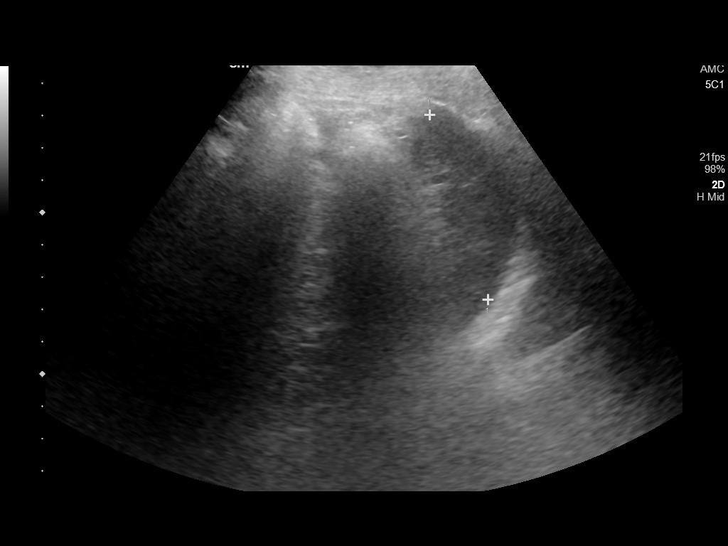
[im 34/63]
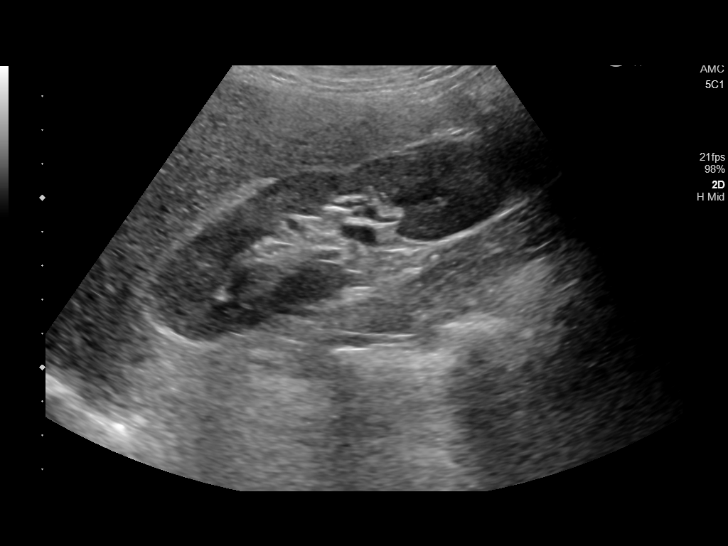
[im 39/63]
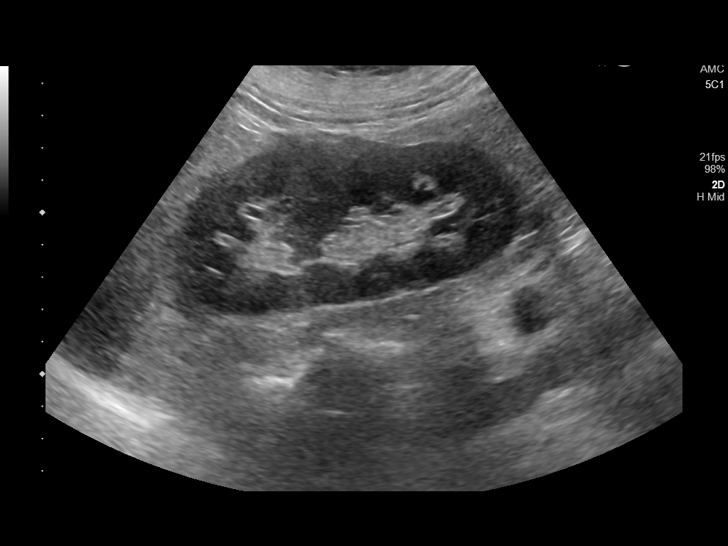
[im 44/63]
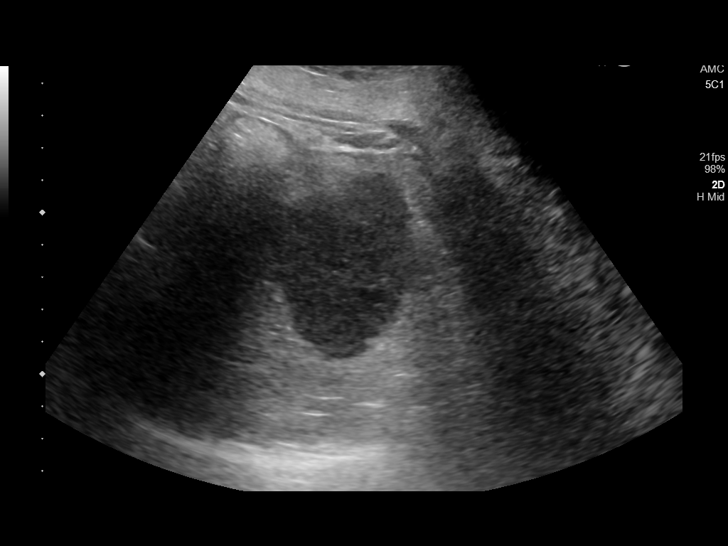
[im 50/63]
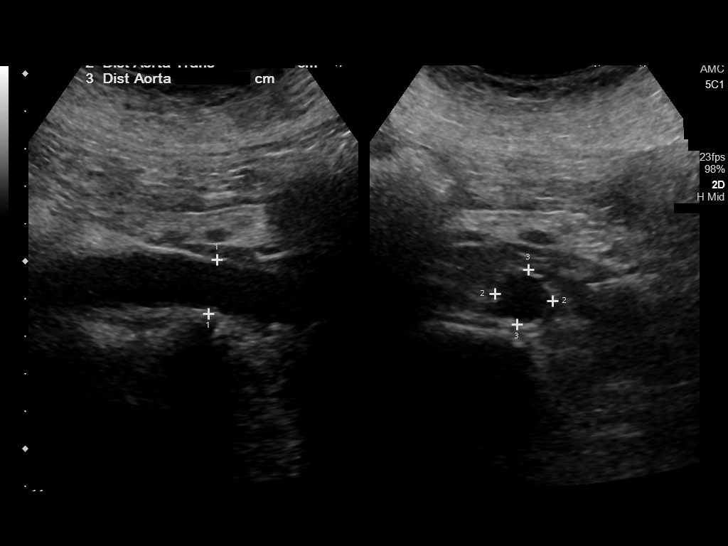
[im 55/63]
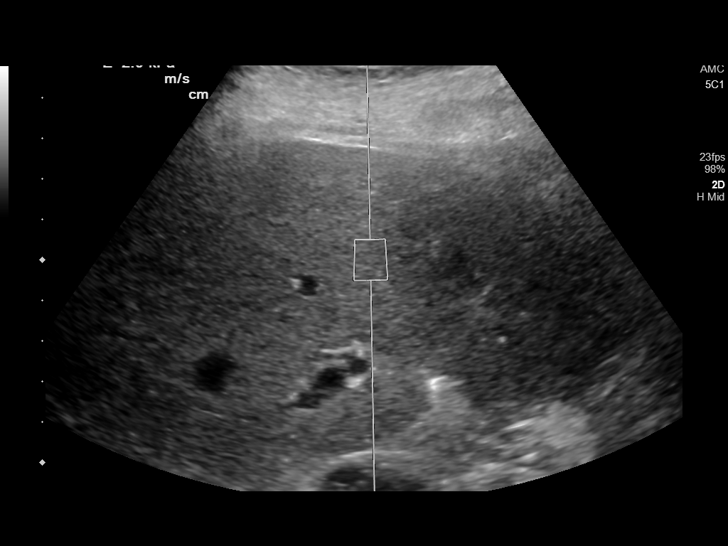
[im 60/63]
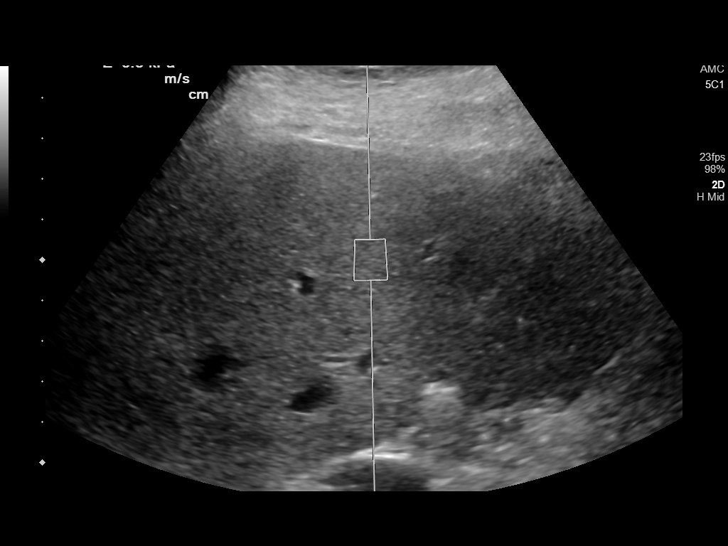

[12 of 25 positions shown; findings below may reference images not displayed]

FINDINGS: ULTRASOUND ABDOMEN

Gallbladder: Surgically absent.

Common bile duct: Diameter: 4 mm, within normal limits.

Liver: Mildly increased echogenicity of the hepatic parenchyma,
consistent with hepatic steatosis. No hepatic mass identified.
Portal vein is patent on color Doppler imaging with normal direction
of blood flow towards the liver.

IVC: No abnormality visualized.

Pancreas: Visualized portion unremarkable.

Spleen: Size and appearance within normal limits.

Right Kidney: Length: 11.8 cm. Echogenicity within normal limits. No
mass or hydronephrosis visualized.

Left Kidney: Length: 11.2 cm. Echogenicity within normal limits.
cm left renal sinus cysts noted. No mass or hydronephrosis
visualized.

Abdominal aorta: No aneurysm visualized.

Other findings: None.

ULTRASOUND HEPATIC ELASTOGRAPHY

Device: Siemens Helix VTQ

Patient position: Supine

Transducer 5C1

Number of measurements: 10

Hepatic segment:  8

Median kPa:

IQR:

IQR/Median kPa ratio:

Data quality:  Reduced accuracy

Diagnostic category:  ?5 kPa: high probability of being normal
IMPRESSION: ULTRASOUND ABDOMEN:

Prior cholecystectomy.  No evidence of biliary ductal dilatation.

Mild hepatic steatosis.  No hepatic mass identified.

ULTRASOUND HEPATIC ELASTOGRAPHY:

Median kPa:

Diagnostic category:  ?5 kPa: high probability of being normal

The use of hepatic elastography is applicable to patients with viral
hepatitis and non-alcoholic fatty liver disease. At this time, there
is insufficient data for the referenced cut-off values and use in
other causes of liver disease, including alcoholic liver disease.
Patients, however, may be assessed by elastography and serve as
their own reference standard/baseline.

In patients with non-alcoholic liver disease, the values suggesting
compensated advanced chronic liver disease (cACLD) may be lower, and
patients may need additional testing with elasticity results of [DATE]
kPa.

Please note that abnormal hepatic elasticity and shear wave
velocities may also be identified in clinical settings other than
with hepatic fibrosis, such as: acute hepatitis, elevated right
heart and central venous pressures including use of beta blockers,
Elidjona disease (Ledesma), infiltrative processes such as
mastocytosis/amyloidosis/infiltrative tumor/lymphoma, extrahepatic
cholestasis, with hyperemia in the post-prandial state, and with
liver transplantation. Correlation with patient history, laboratory
data, and clinical condition recommended.

Diagnostic Categories:

?5 kPa: high probability of being normal

?9 kPa: in the absence of other known clinical signs, rules [DATE] kPa and ?13 kPa: suggestive of cACLD, but needs further testing

>13 kPa: highly suggestive of cACLD

?17 kPa: highly suggestive of cACLD with an increased probability of
clinically significant portal hypertension

## 2022-01-07 ENCOUNTER — Other Ambulatory Visit: Payer: Self-pay | Admitting: Gastroenterology

## 2022-01-07 ENCOUNTER — Other Ambulatory Visit (HOSPITAL_COMMUNITY): Payer: Self-pay | Admitting: Gastroenterology

## 2022-01-07 DIAGNOSIS — K760 Fatty (change of) liver, not elsewhere classified: Secondary | ICD-10-CM

## 2022-01-10 ENCOUNTER — Other Ambulatory Visit: Payer: Self-pay | Admitting: Internal Medicine

## 2022-01-10 DIAGNOSIS — Z1231 Encounter for screening mammogram for malignant neoplasm of breast: Secondary | ICD-10-CM

## 2022-01-13 ENCOUNTER — Ambulatory Visit: Payer: Medicare Other

## 2022-01-14 ENCOUNTER — Ambulatory Visit
Admission: RE | Admit: 2022-01-14 | Discharge: 2022-01-14 | Disposition: A | Discharge status: Home / Self Care | Payer: Medicare Other | Source: Ambulatory Visit | Attending: Gastroenterology | Admitting: Gastroenterology

## 2022-01-14 DIAGNOSIS — K760 Fatty (change of) liver, not elsewhere classified: Secondary | ICD-10-CM | POA: Diagnosis present

## 2022-02-18 ENCOUNTER — Ambulatory Visit
Admission: RE | Admit: 2022-02-18 | Discharge: 2022-02-18 | Disposition: A | Discharge status: Home / Self Care | Payer: Medicare Other | Source: Ambulatory Visit | Attending: Internal Medicine | Admitting: Internal Medicine

## 2022-02-18 DIAGNOSIS — Z1231 Encounter for screening mammogram for malignant neoplasm of breast: Secondary | ICD-10-CM | POA: Insufficient documentation

## 2022-03-25 ENCOUNTER — Encounter: Payer: Self-pay | Admitting: Internal Medicine

## 2022-03-26 ENCOUNTER — Ambulatory Visit: Admission: RE | Admit: 2022-03-26 | Payer: Medicare Other | Source: Home / Self Care | Admitting: Internal Medicine

## 2022-03-26 ENCOUNTER — Encounter: Admission: RE | Payer: Self-pay | Source: Home / Self Care

## 2022-03-26 SURGERY — ESOPHAGOGASTRODUODENOSCOPY (EGD) WITH PROPOFOL
Anesthesia: General

## 2022-04-14 ENCOUNTER — Other Ambulatory Visit
Admission: RE | Admit: 2022-04-14 | Discharge: 2022-04-14 | Disposition: A | Discharge status: Home / Self Care | Payer: Medicare Other | Source: Ambulatory Visit | Attending: Physician Assistant | Admitting: Physician Assistant

## 2022-04-14 DIAGNOSIS — Z79899 Other long term (current) drug therapy: Secondary | ICD-10-CM | POA: Diagnosis present

## 2022-04-14 LAB — DIGOXIN LEVEL: Digoxin Level: 2.5 ng/mL — ABNORMAL HIGH (ref 0.8–2.0)

## 2022-12-25 ENCOUNTER — Other Ambulatory Visit: Payer: Self-pay | Admitting: Gastroenterology

## 2022-12-25 DIAGNOSIS — K760 Fatty (change of) liver, not elsewhere classified: Secondary | ICD-10-CM

## 2023-01-05 ENCOUNTER — Ambulatory Visit
Admission: RE | Admit: 2023-01-05 | Discharge: 2023-01-05 | Disposition: A | Discharge status: Home / Self Care | Payer: Medicare Other | Source: Ambulatory Visit | Attending: Gastroenterology | Admitting: Gastroenterology

## 2023-01-05 DIAGNOSIS — K760 Fatty (change of) liver, not elsewhere classified: Secondary | ICD-10-CM | POA: Diagnosis present

## 2023-01-13 ENCOUNTER — Other Ambulatory Visit: Payer: Self-pay | Admitting: Internal Medicine

## 2023-01-13 DIAGNOSIS — Z1231 Encounter for screening mammogram for malignant neoplasm of breast: Secondary | ICD-10-CM

## 2023-03-10 ENCOUNTER — Ambulatory Visit: Admission: RE | Admit: 2023-03-10 | Payer: Medicare Other | Source: Home / Self Care

## 2023-03-10 SURGERY — ESOPHAGOGASTRODUODENOSCOPY (EGD) WITH PROPOFOL
Anesthesia: General

## 2023-05-06 ENCOUNTER — Ambulatory Visit
Admission: RE | Admit: 2023-05-06 | Discharge: 2023-05-06 | Disposition: A | Discharge status: Home / Self Care | Payer: Medicare Other | Source: Ambulatory Visit | Attending: Internal Medicine | Admitting: Internal Medicine

## 2023-05-06 DIAGNOSIS — Z1231 Encounter for screening mammogram for malignant neoplasm of breast: Secondary | ICD-10-CM | POA: Diagnosis present

## 2023-06-04 ENCOUNTER — Encounter: Payer: Self-pay | Admitting: *Deleted

## 2023-06-12 ENCOUNTER — Encounter: Payer: Self-pay | Admitting: *Deleted

## 2023-06-12 ENCOUNTER — Ambulatory Visit
Admission: RE | Admit: 2023-06-12 | Discharge: 2023-06-12 | Disposition: A | Discharge status: Home / Self Care | Payer: Medicare Other | Attending: Gastroenterology | Admitting: Gastroenterology

## 2023-06-12 ENCOUNTER — Ambulatory Visit: Admitting: Anesthesiology

## 2023-06-12 ENCOUNTER — Encounter
Admission: RE | Disposition: A | Discharge status: Home / Self Care | Payer: Self-pay | Source: Home / Self Care | Attending: Gastroenterology

## 2023-06-12 DIAGNOSIS — K766 Portal hypertension: Secondary | ICD-10-CM | POA: Diagnosis not present

## 2023-06-12 DIAGNOSIS — I509 Heart failure, unspecified: Secondary | ICD-10-CM | POA: Insufficient documentation

## 2023-06-12 DIAGNOSIS — I110 Hypertensive heart disease with heart failure: Secondary | ICD-10-CM | POA: Diagnosis not present

## 2023-06-12 DIAGNOSIS — R1013 Epigastric pain: Secondary | ICD-10-CM | POA: Diagnosis present

## 2023-06-12 DIAGNOSIS — K3189 Other diseases of stomach and duodenum: Secondary | ICD-10-CM | POA: Diagnosis not present

## 2023-06-12 DIAGNOSIS — I4891 Unspecified atrial fibrillation: Secondary | ICD-10-CM | POA: Insufficient documentation

## 2023-06-12 DIAGNOSIS — E109 Type 1 diabetes mellitus without complications: Secondary | ICD-10-CM | POA: Insufficient documentation

## 2023-06-12 DIAGNOSIS — Z794 Long term (current) use of insulin: Secondary | ICD-10-CM | POA: Insufficient documentation

## 2023-06-12 DIAGNOSIS — K2950 Unspecified chronic gastritis without bleeding: Secondary | ICD-10-CM | POA: Insufficient documentation

## 2023-06-12 DIAGNOSIS — Z7984 Long term (current) use of oral hypoglycemic drugs: Secondary | ICD-10-CM | POA: Diagnosis not present

## 2023-06-12 DIAGNOSIS — Z79899 Other long term (current) drug therapy: Secondary | ICD-10-CM | POA: Diagnosis not present

## 2023-06-12 HISTORY — DX: Presence of cardiac pacemaker: Z95.0

## 2023-06-12 LAB — GLUCOSE, CAPILLARY: Glucose-Capillary: 106 mg/dL — ABNORMAL HIGH (ref 70–99)

## 2023-06-12 SURGERY — ESOPHAGOGASTRODUODENOSCOPY (EGD) WITH PROPOFOL
Anesthesia: General

## 2023-06-12 MED ORDER — SODIUM CHLORIDE 0.9 % IV SOLN
INTRAVENOUS | Status: DC
  Administered 2023-06-12: 20 mL/h via INTRAVENOUS

## 2023-06-12 MED ORDER — DEXMEDETOMIDINE HCL IN NACL 80 MCG/20ML IV SOLN
INTRAVENOUS | Status: AC
Start: 2023-06-12 — End: ?
  Filled 2023-06-12: qty 20

## 2023-06-12 MED ORDER — DEXMEDETOMIDINE HCL IN NACL 80 MCG/20ML IV SOLN
INTRAVENOUS | Status: DC | PRN
Start: 2023-06-12 — End: 2023-06-12
  Administered 2023-06-12: 20 ug via INTRAVENOUS

## 2023-06-12 MED ORDER — LIDOCAINE HCL (PF) 2 % IJ SOLN
INTRAMUSCULAR | Status: AC
  Filled 2023-06-12: qty 5

## 2023-06-12 MED ORDER — GLYCOPYRROLATE 0.2 MG/ML IJ SOLN
INTRAMUSCULAR | Status: DC | PRN
  Administered 2023-06-12: .2 mg via INTRAVENOUS

## 2023-06-12 MED ORDER — PROPOFOL 500 MG/50ML IV EMUL
INTRAVENOUS | Status: DC | PRN
  Administered 2023-06-12: 75 ug/kg/min via INTRAVENOUS

## 2023-06-12 MED ORDER — GLYCOPYRROLATE 0.2 MG/ML IJ SOLN
INTRAMUSCULAR | Status: AC
Start: 2023-06-12 — End: ?
  Filled 2023-06-12: qty 1

## 2023-06-12 MED ORDER — PROPOFOL 1000 MG/100ML IV EMUL
INTRAVENOUS | Status: AC
  Filled 2023-06-12: qty 100

## 2023-06-12 MED ORDER — LIDOCAINE HCL (CARDIAC) PF 100 MG/5ML IV SOSY
PREFILLED_SYRINGE | INTRAVENOUS | Status: DC | PRN
  Administered 2023-06-12: 50 mg via INTRAVENOUS

## 2023-06-12 MED ORDER — PROPOFOL 10 MG/ML IV BOLUS
INTRAVENOUS | Status: DC | PRN
  Administered 2023-06-12: 30 mg via INTRAVENOUS
  Administered 2023-06-12: 50 mg via INTRAVENOUS

## 2023-06-12 NOTE — H&P (Signed)
 Outpatient short stay form Pre-procedure 06/12/2023  Regis Bill, MD  Primary Physician: Barbette Reichmann, MD  Reason for visit:  MASLD  History of present illness:    73 y/o lady with history of MASLD, CHF, and a. Fib here for EGD to assess for possible varices. Last dose of DOAC was 3 days ago. No neck surgeries. No family history of GI malignancies.    Current Facility-Administered Medications:    0.9 %  sodium chloride infusion, , Intravenous, Continuous, Ketan Renz, Rossie Muskrat, MD, Last Rate: 20 mL/hr at 06/12/23 8657, Continued from Pre-op at 06/12/23 8469  Facility-Administered Medications Ordered in Other Encounters:    glycopyrrolate (ROBINUL) injection, , Intravenous, Anesthesia Intra-op, Deland Pretty, CRNA, 0.2 mg at 06/12/23 6295  Medications Prior to Admission  Medication Sig Dispense Refill Last Dose/Taking   apixaban (ELIQUIS) 5 MG TABS tablet Take 5 mg by mouth 2 (two) times daily.   Past Week   Calcium Carb-Cholecalciferol (CALCIUM 500+D PO) Take 1 tablet by mouth daily.   Past Week   carvedilol (COREG) 3.125 MG tablet Take 1 tablet (3.125 mg total) by mouth 2 (two) times daily with a meal. 60 tablet 1 06/12/2023 at  6:15 AM   digoxin (LANOXIN) 0.25 MG tablet Take 1 tablet (0.25 mg total) by mouth daily. 30 tablet 0 06/12/2023 at  6:15 AM   fluticasone (FLONASE) 50 MCG/ACT nasal spray Place 1 spray into both nostrils 2 (two) times daily.   Past Week   furosemide (LASIX) 20 MG tablet Take 10 mg by mouth daily.   06/11/2023   insulin lispro (HUMALOG) 100 UNIT/ML injection Inject 12 Units into the skin 3 (three) times daily before meals.   06/12/2023 at  6:15 AM   Magnesium Gluconate 550 MG TABS Take by mouth.   Past Week   Multiple Vitamin (MULTIVITAMIN WITH MINERALS) TABS tablet Take 1 tablet by mouth daily. 30 tablet 0 Past Week   spironolactone (ALDACTONE) 25 MG tablet Take 12.5 mg by mouth daily.   06/11/2023   Turmeric 500 MG CAPS Take 1 capsule by mouth daily.    Past Week   Vitamin D, Ergocalciferol, 2000 units CAPS Take 1 capsule by mouth daily.   Past Week   acidophilus (RISAQUAD) CAPS capsule Take 2 capsules by mouth 3 (three) times daily. (Patient not taking: Reported on 03/04/2023) 30 capsule 0    albuterol (VENTOLIN HFA) 108 (90 Base) MCG/ACT inhaler Inhale 2 puffs into the lungs every 4 (four) hours as needed for shortness of breath. (Patient not taking: Reported on 03/04/2023)      cloNIDine (CATAPRES) 0.2 MG tablet Take 0.2 mg by mouth 3 (three) times daily. (Patient not taking: Reported on 03/04/2023)      colchicine 0.6 MG tablet Take 0.6-1.8 mg by mouth as directed. (Patient not taking: Reported on 03/04/2023)      dexamethasone (DECADRON) 6 MG tablet Take 1 tablet (6 mg total) by mouth daily. (Patient not taking: Reported on 03/04/2023) 5 tablet 0    diltiazem (TIAZAC) 120 MG 24 hr capsule Take 120 mg by mouth daily. (Patient not taking: Reported on 03/04/2023)      glimepiride (AMARYL) 4 MG tablet Take 4 mg by mouth 2 (two) times daily. (Patient not taking: Reported on 03/04/2023)      guaiFENesin-codeine 100-10 MG/5ML syrup Take 5 mLs by mouth every 4 (four) hours as needed for cough. (Patient not taking: Reported on 03/04/2023)      insulin glargine (LANTUS SOLOSTAR) 100 UNIT/ML Solostar  Pen Inject 15 Units into the skin at bedtime. 4.5 mL 11    Semaglutide,0.25 or 0.5MG /DOS, (OZEMPIC, 0.25 OR 0.5 MG/DOSE,) 2 MG/1.5ML SOPN Inject into the skin. (Patient not taking: Reported on 03/04/2023)      warfarin (COUMADIN) 2 MG tablet Take 1 tablet (2 mg total) by mouth daily at 4 PM. (Patient not taking: Reported on 03/04/2023) 30 tablet 1 Not Taking     Allergies  Allergen Reactions   Doxazosin Other (See Comments)    Cardura - cough   Doxycycline     Abdominal pain   Drug Class [Clindamycin/Lincomycin] Hives   Hydralazine Hcl Other (See Comments)    gout   Metformin And Related Swelling   Ciprofloxacin Other (See Comments)    Numbness in  face   Iodine Other (See Comments)   Augmentin [Amoxicillin-Pot Clavulanate] Diarrhea   Dulaglutide Nausea Only   Hctz [Hydrochlorothiazide] Other (See Comments)    Gout   Losartan Diarrhea   Poison Oak Extract Rash   Red Dye #40 (Allura Red) Rash     Past Medical History:  Diagnosis Date   Allergic genetic state    Atrial fibrillation (HCC) 2010   Cardiomyopathy (HCC)    CHF (congestive heart failure) (HCC)    Diabetes mellitus without complication (HCC)    Dysrhythmia    Gout    H/O cardiac catheterization    Hypertension    Joint pain    Osteopenia    Psoriasis     Review of systems:  Otherwise negative.    Physical Exam  Gen: Alert, oriented. Appears stated age.  HEENT: PERRLA. Lungs: No respiratory distress CV: RRR Abd: soft, benign, no masses Ext: No edema    Planned procedures: Proceed with EGD. The patient understands the nature of the planned procedure, indications, risks, alternatives and potential complications including but not limited to bleeding, infection, perforation, damage to internal organs and possible oversedation/side effects from anesthesia. The patient agrees and gives consent to proceed.  Please refer to procedure notes for findings, recommendations and patient disposition/instructions.     Regis Bill, MD [facility]

## 2023-06-12 NOTE — Transfer of Care (Signed)
 Immediate Anesthesia Transfer of Care Note  Patient: Deborah Dawson  Procedure(s) Performed: ESOPHAGOGASTRODUODENOSCOPY (EGD) WITH PROPOFOL  Patient Location: PACU  Anesthesia Type:General  Level of Consciousness: sedated  Airway & Oxygen Therapy: Patient Spontanous Breathing  Post-op Assessment: Report given to RN and Post -op Vital signs reviewed and stable  Post vital signs: Reviewed and stable  Last Vitals:  Vitals Value Taken Time  BP 140/89 06/12/23 0828  Temp    Pulse 60 06/12/23 0828  Resp 25 06/12/23 0828  SpO2 93 % 06/12/23 0828    Last Pain:  Vitals:   06/12/23 0828  TempSrc:   PainSc: Asleep         Complications: No notable events documented.

## 2023-06-12 NOTE — Anesthesia Preprocedure Evaluation (Signed)
 Anesthesia Evaluation  Patient identified by MRN, date of birth, ID band Patient awake    Reviewed: Allergy & Precautions, NPO status , Patient's Chart, lab work & pertinent test results  Airway Mallampati: II  TM Distance: >3 FB Neck ROM: full    Dental  (+) Teeth Intact   Pulmonary neg pulmonary ROS, shortness of breath and with exertion   Pulmonary exam normal        Cardiovascular Exercise Tolerance: Poor hypertension, Pt. on medications +CHF and + DOE  negative cardio ROS Normal cardiovascular exam+ dysrhythmias Atrial Fibrillation  Rhythm:Regular Rate:Normal     Neuro/Psych negative neurological ROS  negative psych ROS   GI/Hepatic negative GI ROS, Neg liver ROS,,,  Endo/Other  negative endocrine ROSdiabetes, Type 1, Insulin Dependent    Renal/GU negative Renal ROS  negative genitourinary   Musculoskeletal negative musculoskeletal ROS (+)    Abdominal Normal abdominal exam  (+)   Peds negative pediatric ROS (+)  Hematology negative hematology ROS (+)   Anesthesia Other Findings Past Medical History: No date: Allergic genetic state 2010: Atrial fibrillation (HCC) No date: Cardiomyopathy (HCC) No date: CHF (congestive heart failure) (HCC) No date: Diabetes mellitus without complication (HCC) No date: Dysrhythmia No date: Gout No date: H/O cardiac catheterization No date: Hypertension No date: Joint pain No date: Osteopenia No date: Psoriasis  Past Surgical History: 1985: ABDOMINAL HYSTERECTOMY No date: BREAST EXCISIONAL BIOPSY; Left     Comment:  negative years ago 1997: BREAST SURGERY; Left     Comment:  papilloma 08/01/2015: CARDIAC CATHETERIZATION; Left     Comment:  Procedure: Left Heart Cath and Coronary Angiography;                Surgeon: Dalia Heading, MD;  Location: [facility] INVASIVE CV               LAB;  Service: Cardiovascular;  Laterality: Left; 1986: CHOLECYSTECTOMY 08/26/2017:  COLONOSCOPY WITH PROPOFOL; N/A     Comment:  Procedure: COLONOSCOPY WITH PROPOFOL;  Surgeon: Toledo,               Boykin Nearing, MD;  Location: [facility] ENDOSCOPY;  Service:               Gastroenterology;  Laterality: N/A; 08/26/2017: ESOPHAGOGASTRODUODENOSCOPY (EGD) WITH PROPOFOL; N/A     Comment:  Procedure: ESOPHAGOGASTRODUODENOSCOPY (EGD) WITH               PROPOFOL;  Surgeon: Toledo, Boykin Nearing, MD;  Location:               [facility] ENDOSCOPY;  Service: Gastroenterology;  Laterality:               N/A; 07/04/2019: ESOPHAGOGASTRODUODENOSCOPY (EGD) WITH PROPOFOL; N/A     Comment:  Procedure: ESOPHAGOGASTRODUODENOSCOPY (EGD) WITH               PROPOFOL;  Surgeon: Toledo, Boykin Nearing, MD;  Location:               [facility] ENDOSCOPY;  Service: Gastroenterology;  Laterality:               N/A; 1981: TUBAL LIGATION  BMI    Body Mass Index: 28.87 kg/m      Reproductive/Obstetrics negative OB ROS                              Anesthesia Physical Anesthesia Plan  ASA: 3  Anesthesia Plan: General  Post-op Pain Management:    Induction: Intravenous  PONV Risk Score and Plan: Propofol infusion and TIVA  Airway Management Planned: Natural Airway and Nasal Cannula  Additional Equipment:   Intra-op Plan:   Post-operative Plan:   Informed Consent: I have reviewed the patients History and Physical, chart, labs and discussed the procedure including the risks, benefits and alternatives for the proposed anesthesia with the patient or authorized representative who has indicated his/her understanding and acceptance.     Dental Advisory Given  Plan Discussed with: CRNA  Anesthesia Plan Comments:          Anesthesia Quick Evaluation

## 2023-06-12 NOTE — Anesthesia Postprocedure Evaluation (Signed)
 Anesthesia Post Note  Patient: Deborah Dawson  Procedure(s) Performed: ESOPHAGOGASTRODUODENOSCOPY (EGD) WITH PROPOFOL  Patient location during evaluation: PACU Anesthesia Type: General Level of consciousness: awake Pain management: pain level controlled Vital Signs Assessment: post-procedure vital signs reviewed and stable Respiratory status: spontaneous breathing and nonlabored ventilation Cardiovascular status: stable Anesthetic complications: no   No notable events documented.   Last Vitals:  Vitals:   06/12/23 0848 06/12/23 0858  BP: (!) 150/82 (!) 158/78  Pulse: 62 (!) 59  Resp: (!) 21 19  Temp:    SpO2: 94% 93%    Last Pain:  Vitals:   06/12/23 0858  TempSrc:   PainSc: 0-No pain                 VAN STAVEREN,Charrise Lardner

## 2023-06-12 NOTE — Op Note (Signed)
 [facility] Patient Name: Deborah Dawson Procedure Date: 06/12/2023 7:12 AM MRN: [id number] Account #: [id number] Date of Birth: Oct 21, 1950 Admit Type: Outpatient Age: 73 Room: [facility] ENDO ROOM 3 Gender: Female Note Status: Finalized Instrument Name: Upper Endoscope [id number] Procedure:             Upper GI endoscopy Indications:           Dyspepsia, Portal hypertensive gastropathy Providers:             Eather Colas MD, MD Referring MD:          Barbette Reichmann, MD (Referring MD) Medicines:             Monitored Anesthesia Care Complications:         No immediate complications. Estimated blood loss:                         Minimal. Procedure:             Pre-Anesthesia Assessment:                        - Prior to the procedure, a History and Physical was                         performed, and patient medications and allergies were                         reviewed. The patient is competent. The risks and                         benefits of the procedure and the sedation options and                         risks were discussed with the patient. All questions                         were answered and informed consent was obtained.                         Patient identification and proposed procedure were                         verified by the physician, the nurse, the                         anesthesiologist, the anesthetist and the technician                         in the endoscopy suite. Mental Status Examination:                         alert and oriented. Airway Examination: normal                         oropharyngeal airway and neck mobility. Respiratory                         Examination: clear to auscultation. CV Examination:  normal. Prophylactic Antibiotics: The patient does not                         require prophylactic antibiotics. Prior                         Anticoagulants: The patient has taken no  anticoagulant                         or antiplatelet agents. ASA Grade Assessment: III - A                         patient with severe systemic disease. After reviewing                         the risks and benefits, the patient was deemed in                         satisfactory condition to undergo the procedure. The                         anesthesia plan was to use monitored anesthesia care                         (MAC). Immediately prior to administration of                         medications, the patient was re-assessed for adequacy                         to receive sedatives. The heart rate, respiratory                         rate, oxygen saturations, blood pressure, adequacy of                         pulmonary ventilation, and response to care were                         monitored throughout the procedure. The physical                         status of the patient was re-assessed after the                         procedure.                        After obtaining informed consent, the endoscope was                         passed under direct vision. Throughout the procedure,                         the patient's blood pressure, pulse, and oxygen                         saturations were monitored continuously. The Endoscope  was introduced through the mouth, and advanced to the                         second part of duodenum. The upper GI endoscopy was                         accomplished without difficulty. The patient tolerated                         the procedure well. Findings:      The examined esophagus was normal.      Moderate portal hypertensive gastropathy was found in the entire       examined stomach. Biopsies were taken with a cold forceps for histology.       Estimated blood loss was minimal.      The examined duodenum was normal. Impression:            - Normal esophagus.                        - Portal hypertensive gastropathy.  Biopsied.                        - Normal examined duodenum. Recommendation:        - Discharge patient to home.                        - Resume previous diet.                        - Continue present medications.                        - Await pathology results.                        - Return to referring physician as previously                         scheduled. Would not repeat EGD unless develops                         symptoms or has a decompensation event. Procedure Code(s):     --- Professional ---                        [phone], Esophagogastroduodenoscopy, flexible,                         transoral; with biopsy, single or multiple Diagnosis Code(s):     --- Professional ---                        K76.6, Portal hypertension                        K31.89, Other diseases of stomach and duodenum                        R10.13, Epigastric pain CPT copyright 2022 American Medical Association. All rights reserved. The codes documented in this report are preliminary and upon coder review may  be revised to  meet current compliance requirements. Eather Colas MD, MD 06/12/2023 8:32:06 AM Number of Addenda: 0 Note Initiated On: 06/12/2023 7:12 AM Estimated Blood Loss:  Estimated blood loss was minimal.      [facility]

## 2023-06-12 NOTE — Interval H&P Note (Signed)
 History and Physical Interval Note:  06/12/2023 8:15 AM  Deborah Dawson  has presented today for surgery, with the diagnosis of NALFD, dyspepsia.  The various methods of treatment have been discussed with the patient and family. After consideration of risks, benefits and other options for treatment, the patient has consented to  Procedure(s) with comments: ESOPHAGOGASTRODUODENOSCOPY (EGD) WITH PROPOFOL (N/A) - DM as a surgical intervention.  The patient's history has been reviewed, patient examined, no change in status, stable for surgery.  I have reviewed the patient's chart and labs.  Questions were answered to the patient's satisfaction.     Regis Bill  Ok to proceed with EGD

## 2023-06-15 LAB — SURGICAL PATHOLOGY

## 2023-09-16 IMAGING — MG MM DIGITAL SCREENING BILAT W/ TOMO AND CAD
6 of 12 series · 6 of 36 positions shown · non-contrast
Comparison: Previous exam(s).

CLINICAL DATA: Screening.

EXAM:
DIGITAL SCREENING BILATERAL MAMMOGRAM WITH TOMOSYNTHESIS AND CAD
TECHNIQUE: Bilateral screening digital craniocaudal and mediolateral oblique
mammograms were obtained. Bilateral screening digital breast
tomosynthesis was performed. The images were evaluated with
computer-aided detection.

[L MLO synth-2D (1 of 2)]
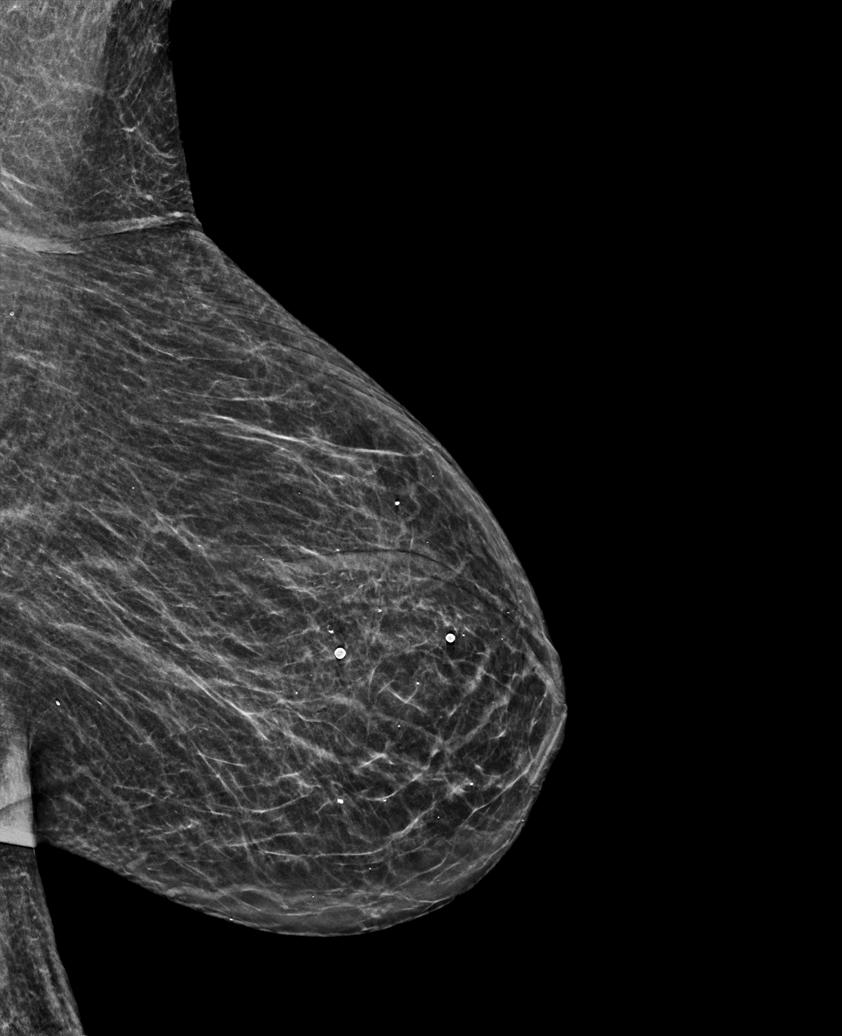

[L CC synth-2D]
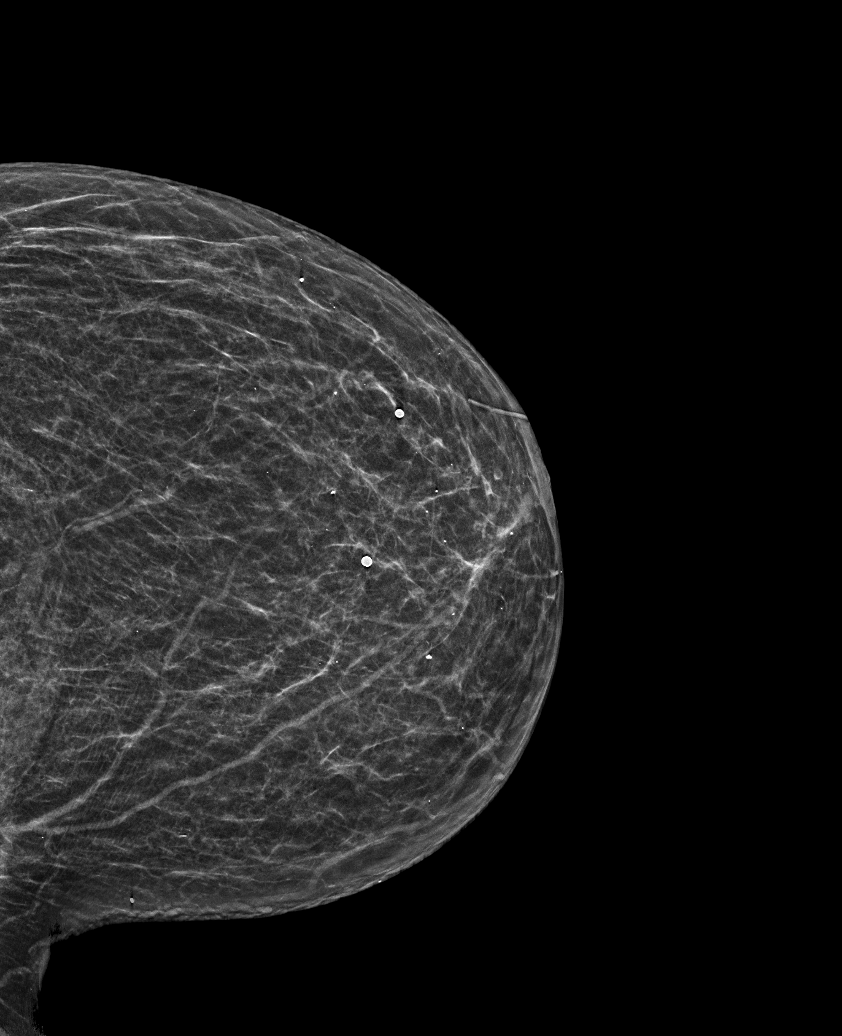

[R CC synth-2D]
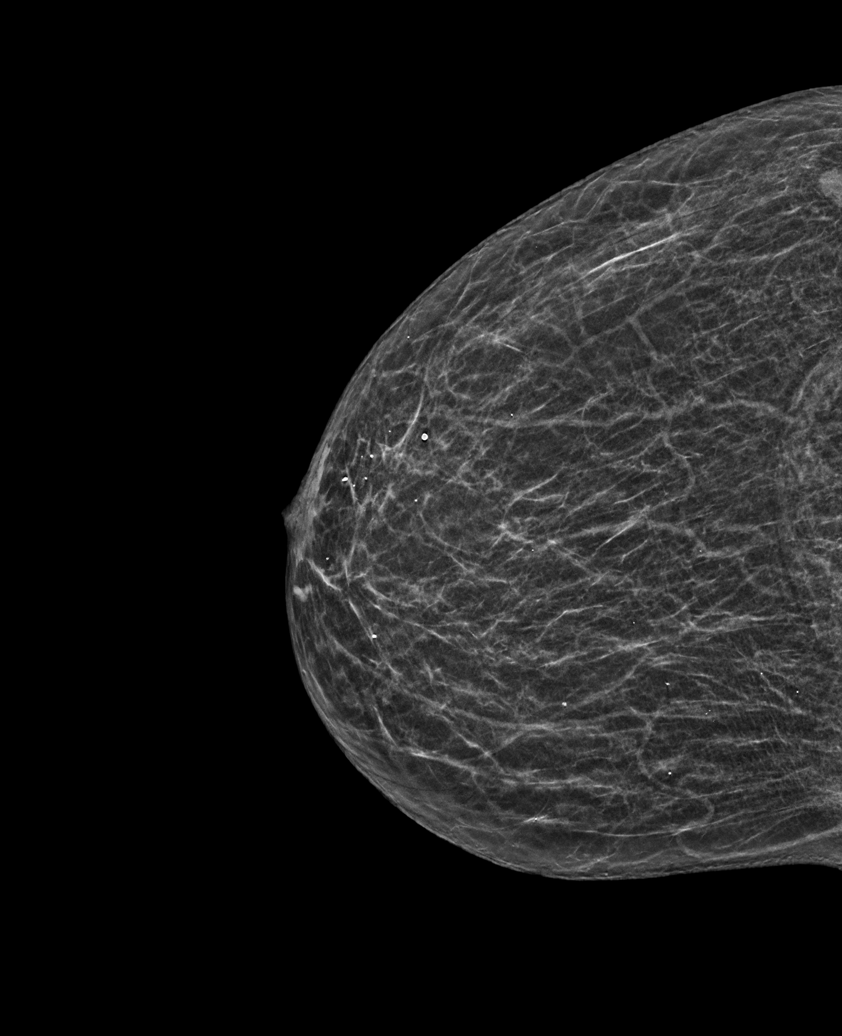

[L MLO synth-2D (2 of 2)]
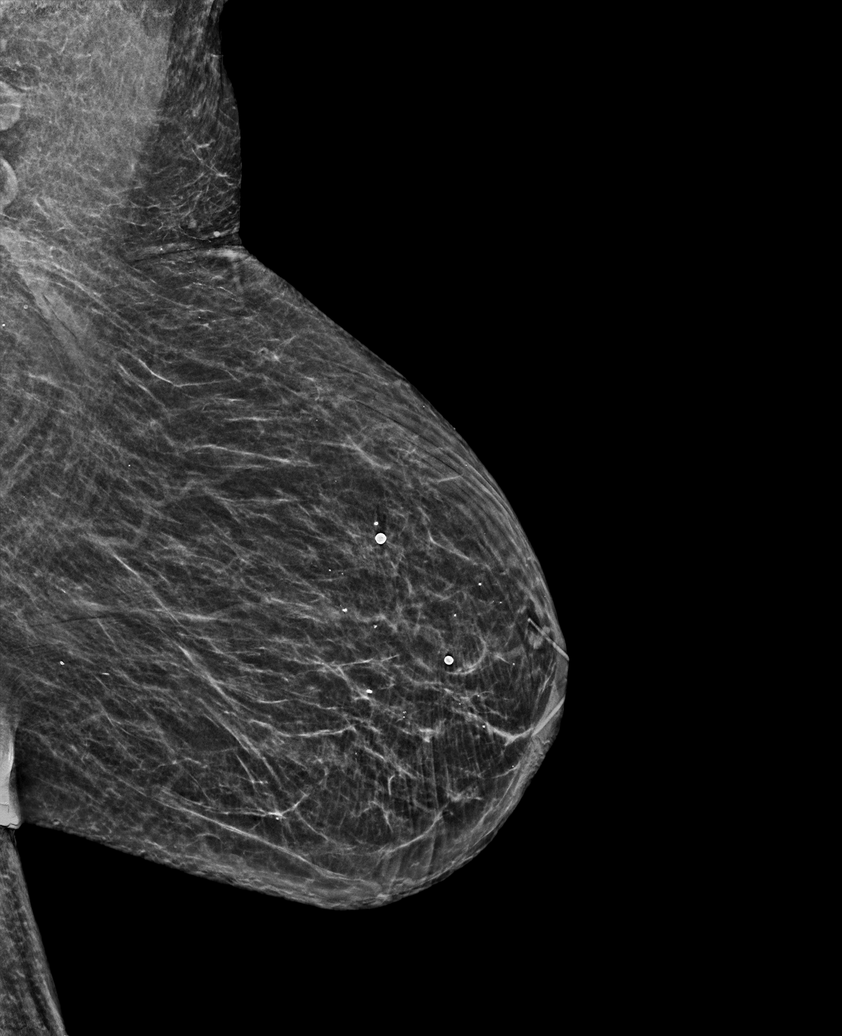

[R MLO synth-2D]
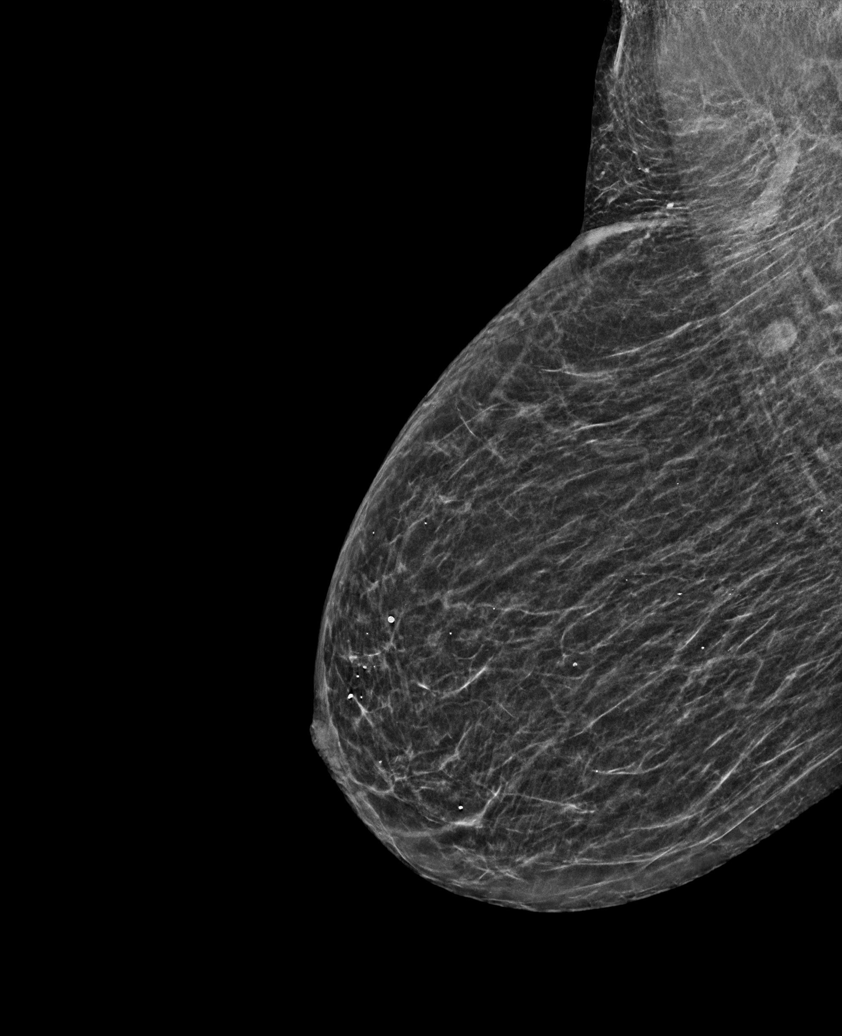

[R XCCL synth-2D]
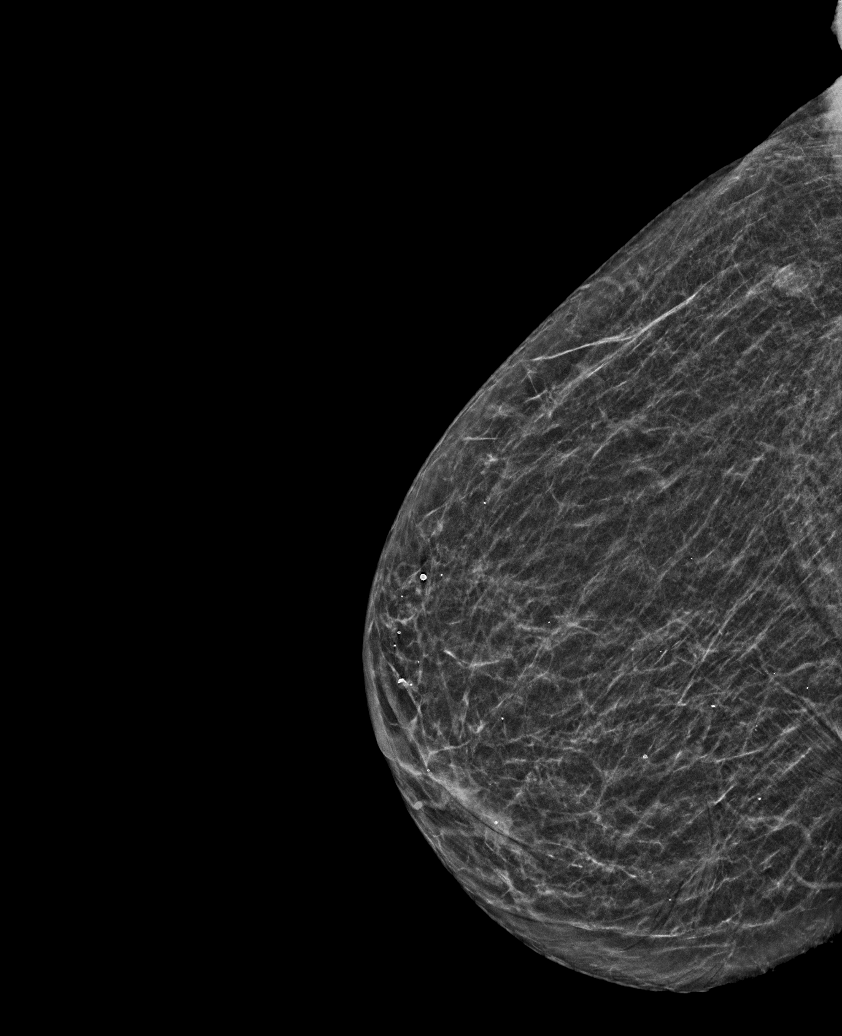

[6 of 36 positions shown; findings below may reference images not displayed]

ACR Breast Density Category b: There are scattered areas of
fibroglandular density.
FINDINGS: There are no findings suspicious for malignancy.
IMPRESSION: No mammographic evidence of malignancy. A result letter of this
screening mammogram will be mailed directly to the patient.

RECOMMENDATION:
Screening mammogram in one year. (Code:51-O-LD2)

BI-RADS CATEGORY  1: Negative.

## 2024-01-12 NOTE — Progress Notes (Signed)
 Established Patient Visit   Chief Complaint: No chief complaint on file.  Date of Service: 01/12/2024 Date of Birth: [birth year] PCP: Deborah Tamra Cal, MD  History of Present Illness: Deborah Dawson is a 73 y.o.female patient with a history of    Chronic HFrEF    NYHA class II   Etiology: Nonischemic   EF 30% 08/27/2022 Chronic atrial fibirllation Hypertension Type II diabetes CRT-P, [employer], 07/31/2021   Patient was seen on 05/26/2023 by Myer Martinet for shortness of breath.  Chest x-ray revealed right pleural effusion.  Furosemide  was increased to 20 mg daily.  Repeat chest x-ray 06/10/2023 did not reveal any acute cardiopulmonary disease.  Patient was seen by Dr. Rosann Patel/20 11/2023 and started on Farxiga 10 mg daily.   Patient returns today for follow-up, reports doing okay.  She denies exertional chest pain or dyspnea.  She reports peripheral edema improved on furosemide  20 mg daily.  The patient is active, but does not exercise regularly.  Pacemaker interrogation 07/14/2023 revealed normal function, 9 NSVT , no device therapy, 99% BiV pacing, longevity 10 years.  2D echocardiogram 08/27/2022 revealed moderately reduced left ventricular function with LVEF 30% with mild mitral and tricuspid regurgitation.  The patient has chronic atrial fibrillation on Eliquis for stroke prevention and digoxin  and carvedilol  for rate and rhythm control. She denies melena or hematochezia.  Did not tolerate SGLT2i 2/2 GU sxs.    Past Medical and Surgical History  Past Medical History Past Medical History:  Diagnosis Date  . Allergic state   . Atrial fibrillation (CMS/HHS-HCC)   . Bradycardia 07/05/2021  . Cardiomyopathy, secondary (CMS/HHS-HCC)   . Chronic a-fib (CMS/HHS-HCC) 06/24/2015  . Chronic systolic heart failure (CMS/HHS-HCC) 2017  . Encounter for care of pacemaker 10/21/2021  . Essential hypertension, benign   . Left bundle branch block 07/05/2021  . Motion sickness   .  Osteopenia   . Pacemaker 08/14/2021  . Pleurisy   . Type II or unspecified type diabetes mellitus without mention of complication, not stated as uncontrolled (CMS/HHS-HCC)     Past Surgical History She has a past surgical history that includes cardiac catheterization (2010); Cholecystectomy (1986); Tubal ligation (1981); Hysterectomy (1985); Colonoscopy (08/26/2017); egd (08/26/2017); egd (07/04/2019); and EGD @ [facility] (06/12/2023).   Medications and Allergies  Current Medications  Current Outpatient Medications  Medication Sig Dispense Refill  . carvediloL  (COREG ) 12.5 MG tablet TAKE 1 TABLET BY MOUTH 2 TIMES DAILY WITH MEALS 180 tablet 3  . cholecalciferol (VITAMIN D3) 1000 unit capsule Take 1,000 Units by mouth once daily    . digoxin  (LANOXIN ) 0.125 MG tablet TAKE 1 TABLET (0.125 MG TOTAL) BY MOUTH ONCE DAILY 90 tablet 3  . ELIQUIS 5 mg tablet TAKE 1 TABLET BY MOUTH 2 TIMES DAILY 60 tablet 11  . estradioL (ESTRACE) 0.01 % (0.1 mg/gram) vaginal cream Place a small amount around the urethra daily 30 g 2  . FUROsemide  (LASIX ) 20 MG tablet 1-2 tabs po qd 60 tablet 5  . gabapentin (NEURONTIN) 100 MG capsule TAKE 1 CAPSULE BY MOUTH 2 TIMES DAILY 180 capsule 1  . HUMALOG KWIKPEN INSULIN  pen injector (concentration 100 units/mL) INJECT 12 UNITS SUBCUTANEOUSLY 3 TIMES DAILY WITH MEALS 3 mL 11  . LANTUS  SOLOSTAR U-100 INSULIN  pen injector (concentration 100 units/mL) INJECT 20 UNITS SUBCUTANEOUSLY DAILY AT BEDTIME. 15 mL 2  . magnesium  30 mg tablet Take 30 mg by mouth at bedtime As needed    . nystatin (MYCOSTATIN) 100,000 unit/gram cream Apply topically  2 (two) times daily 30 g 2  . pen needle, diabetic 31 gauge x 5/16 needle Use 2 (two) times daily 200 each 1  . turmeric-turmeric root extract 450-50 mg Cap Take 1 capsule by mouth as needed    . ipratropium (ATROVENT) 0.06 % nasal spray Place 2 sprays into both nostrils 2 (two) times daily as needed     No current facility-administered  medications for this visit.    Allergies: Clindamycin, Hctz/reserpine/hydralazine [hydralazine-reserpin-hcthiazid], Actos [pioglitazone], Amoxicillin-pot clavulanate, Bactrim [sulfamethoxazole-trimethoprim], Cardura [doxazosin], Ciprofloxacin, Entresto [sacubitril-valsartan], Losartan, Metformin , Ozempic [semaglutide], Poison oak extract, Trulicity [dulaglutide], Doxycycline , and Hydrochlorothiazide  Social and Family History  Social History  reports that she has never smoked. She has never used smokeless tobacco. She reports that she does not drink alcohol and does not use drugs.  Family History Family History  Problem Relation Name Age of Onset  . Diabetes type II Mother    . Coronary Artery Disease (Blocked arteries around heart) Mother    . Cancer Mother    . Stroke Father    . Coronary Artery Disease (Blocked arteries around heart) Father    . Anesthesia problems Neg Hx    . Malignant hyperthermia Neg Hx      Review of Systems   Review of Systems: A 10 point review was completed and negative unless detailed above.   Physical Examination   Vitals:BP (!) 150/90   Pulse 68   Ht 154.9 cm (5' 1)   Wt 68.6 kg (151 lb 3.2 oz)   LMP  (LMP Unknown)   SpO2 99%   BMI 28.57 kg/m  Ht:154.9 cm (5' 1) Wt:68.6 kg (151 lb 3.2 oz) ADJ:Anib surface area is 1.72 meters squared. Body mass index is 28.57 kg/m.   HEENT: Sclera anicteric Neck: no JVD Lungs: Normal effort of breathing; clear to auscultation bilaterally; no wheezes, rales, rhonchi Heart: Regular rate and rhythm. No murmur, rub, or gallop Abdomen: soft Extremities: trace edema  Assessment   73 y.o. female with  1. Chronic systolic CHF (congestive heart failure), NYHA class 3 (CMS/HHS-HCC)    73 year old female with chronic HFrEF (EF 30%), status post CRT-P in 07/2021.  The echocardiogram from today appears to be about the same in terms of LV function.  She has been intolerant of many medical therapies and we will try  to increase her carvedilol  slightly today to 18.75 mg twice daily.  She reports that the higher dose of 25 mg was not tolerated in the past which did not recall exactly why.   We will not try to restart Entresto or an SGLT2 inhibitor.   BiV pacing is at a high percentage.  Patient remains on Eliquis for stroke prevention for atrial fibrillation.  Her rate is controlled.  Despite her lower ejection fraction she still has a reasonable functional status and no hospitalizations for heart failure.  I will recheck her renal function today and also a BNP.   Orders Placed This Encounter  Procedures  . Basic Metabolic Panel (BMP)  . Pro-Brain Natriuretic peptide, N-Terminal (NT-pro-BNP)    Return in about 6 months (around 07/12/2024).   CHETAN B PATEL, MD

## 2024-01-22 NOTE — Progress Notes (Signed)
 Established Patient Visit   Chief Complaint: Chief Complaint  Patient presents with  . Follow-up    3 month    Date of Service: 01/22/2024 Date of Birth: 12/18/1950 PCP: Sadie Tamra Cal, MD  History of Present Illness: [name] is a 73 y.o.female patient with a history of    Chronic HFrEF    NYHA class II   Etiology: Nonischemic   EF 30% 08/27/2022 Chronic atrial fibirllation Hypertension Type II diabetes CRT-P, [employer], 07/31/2021   The patient was previously followed by Dr. Bosie and Dr. Lawyer.  Patient was seen on 05/26/2023 by Myer Martinet for shortness of breath.  Chest x-ray revealed right pleural effusion.  Furosemide  was increased to 20 mg daily.  Repeat chest x-ray 06/10/2023 did not reveal any acute cardiopulmonary disease.  Patient was seen by Dr. Rosann Patel/20 11/2023 and started on Farxiga 10 mg daily.   Patient returns today for follow-up, reports doing okay.  She denies exertional chest pain.  She reports mild exertional dyspnea.  She reports occasional palpitations.  She reports peripheral edema improved on furosemide  20 mg daily.  The patient is active, but does not exercise regularly.  Pacemaker interrogation 07/14/2023 revealed normal function, 9 NSVT , no device therapy, 99% BiV pacing, longevity 10 years.  2D echocardiogram 08/27/2022 revealed moderately reduced left ventricular function with LVEF 30% with mild mitral and tricuspid regurgitation.  The patient has chronic atrial fibrillation on Eliquis for stroke prevention and digoxin  and carvedilol  for rate and rhythm control. She denies melena or hematochezia.  The patient has essential hypertension hypertension with good blood pressure control today on carvedilol , spironolactone, furosemide .  She follows a low sodium, no added salt diet.   Past Medical and Surgical History  Past Medical History Past Medical History:  Diagnosis Date  . Allergic state   . Atrial fibrillation (CMS/HHS-HCC)   .  Bradycardia 07/05/2021  . Cardiomyopathy, secondary (CMS/HHS-HCC)   . Chronic a-fib (CMS/HHS-HCC) 06/24/2015  . Chronic systolic heart failure (CMS/HHS-HCC) 2017  . Encounter for care of pacemaker 10/21/2021  . Essential hypertension, benign   . Left bundle branch block 07/05/2021  . Motion sickness   . Osteopenia   . Pacemaker 08/14/2021  . Pleurisy   . Type II or unspecified type diabetes mellitus without mention of complication, not stated as uncontrolled (CMS/HHS-HCC)     Past Surgical History She has a past surgical history that includes cardiac catheterization (2010); Cholecystectomy (1986); Tubal ligation (1981); Hysterectomy (1985); Colonoscopy (08/26/2017); egd (08/26/2017); egd (07/04/2019); and EGD @ [facility] (06/12/2023).   Medications and Allergies  Current Medications  Current Outpatient Medications  Medication Sig Dispense Refill  . carvediloL  (COREG ) 12.5 MG tablet TAKE 1 TABLET BY MOUTH 2 TIMES DAILY WITH MEALS (Patient taking differently: Take 18.75 mg by mouth 2 (two) times daily with meals) 180 tablet 3  . cholecalciferol (VITAMIN D3) 1000 unit capsule Take 1,000 Units by mouth once daily    . digoxin  (LANOXIN ) 0.125 MG tablet TAKE 1 TABLET (0.125 MG TOTAL) BY MOUTH ONCE DAILY 90 tablet 3  . ELIQUIS 5 mg tablet TAKE 1 TABLET BY MOUTH 2 TIMES DAILY 60 tablet 11  . estradioL (ESTRACE) 0.01 % (0.1 mg/gram) vaginal cream Place a small amount around the urethra daily 30 g 2  . FUROsemide  (LASIX ) 20 MG tablet 1-2 tabs po qd 60 tablet 5  . gabapentin (NEURONTIN) 100 MG capsule TAKE 1 CAPSULE BY MOUTH 2 TIMES DAILY 180 capsule 1  . HUMALOG KWIKPEN INSULIN   pen injector (concentration 100 units/mL) INJECT 12 UNITS SUBCUTANEOUSLY 3 TIMES DAILY WITH MEALS 3 mL 11  . LANTUS  SOLOSTAR U-100 INSULIN  pen injector (concentration 100 units/mL) INJECT 20 UNITS SUBCUTANEOUSLY DAILY AT BEDTIME. 15 mL 2  . magnesium  30 mg tablet Take 30 mg by mouth at bedtime As needed    . nystatin  (MYCOSTATIN) 100,000 unit/gram cream Apply topically 2 (two) times daily 30 g 2  . turmeric-turmeric root extract 450-50 mg Cap Take 1 capsule by mouth as needed    . ipratropium (ATROVENT) 0.06 % nasal spray Place 2 sprays into both nostrils 2 (two) times daily as needed    . pen needle, diabetic 31 gauge x 5/16 needle Use 2 (two) times daily 200 each 1   No current facility-administered medications for this visit.    Allergies: Clindamycin, Hctz/reserpine/hydralazine [hydralazine-reserpin-hcthiazid], Actos [pioglitazone], Amoxicillin-pot clavulanate, Bactrim [sulfamethoxazole-trimethoprim], Cardura [doxazosin], Ciprofloxacin, Entresto [sacubitril-valsartan], Losartan, Metformin , Ozempic [semaglutide], Poison oak extract, Trulicity [dulaglutide], Doxycycline , and Hydrochlorothiazide  Social and Family History  Social History  reports that she has never smoked. She has never used smokeless tobacco. She reports that she does not drink alcohol and does not use drugs.  Family History Family History  Problem Relation Name Age of Onset  . Diabetes type II Mother    . Coronary Artery Disease (Blocked arteries around heart) Mother    . Cancer Mother    . Stroke Father    . Coronary Artery Disease (Blocked arteries around heart) Father    . Anesthesia problems Neg Hx    . Malignant hyperthermia Neg Hx      Review of Systems   Review of Systems: The patient denies chest pain, reports chronic exertional shortness of breath, without orthopnea, paroxysmal nocturnal dyspnea, pedal edema, with frequent palpitations, without heart racing, presyncope, syncope. Review of 8 Systems is negative except as described above.  Physical Examination   Vitals:BP 122/80   Pulse 86   Ht 154.9 cm (5' 1)   Wt 69.4 kg (153 lb)   LMP  (LMP Unknown)   SpO2 98%   BMI 28.91 kg/m  Ht:154.9 cm (5' 1) Wt:69.4 kg (153 lb) ADJ:Anib surface area is 1.73 meters squared. Body mass index is 28.91 kg/m.  General:  Alert and oriented. Well-appearing. No acute distress. HEENT: Pupils equally reactive to light and accomodation    Neck: no JVD Lungs: Normal effort of breathing; clear to auscultation bilaterally; no wheezes, rales, rhonchi Heart: Regular rate and rhythm. No murmur, rub, or gallop Abdomen: nondistended Extremities: no cyanosis, clubbing, or edema Peripheral Pulses: 2+ radial Skin: Warm, dry, no diaphoresis   Assessment   73 y.o. female with  1. Bradycardia   2. Cardiac resynchronization therapy pacemaker (CRT-P)   3. Chronic a-fib (CMS/HHS-HCC)   4. Chronic systolic CHF (congestive heart failure), NYHA class 3 (CMS/HHS-HCC)   5. Essential (primary) hypertension   6. Left bundle branch block   7. Type 2 diabetes mellitus with hyperglycemia, with long-term current use of insulin  (CMS/HHS-HCC)   8. Uncontrolled type 2 diabetes mellitus with hyperglycemia (CMS/HHS-HCC)    73 year old female with chronic HFrEF (EF 30%), status post CRT-P in 07/2021.  The patient has chronic atrial fibrillation on Eliquis for stroke prevention, and digoxin  for rate control.  The patient has essential hypertension, blood pressure is mildly elevated today. Goal to treat with GDMT, but patient has been apprehensive about starting ACEi/ARB or ARNI with history of medication allergies and intolerances. Her symptoms appear stable at this time.  Patient unable to tolerate Entresto and Farxiga.    Plan   1.  Continue current medications 2.  Counseled patient about low-sodium diet 3.  DASH diet printed instructions given to the patient 4.  Continue furosemide  20 g daily 5.  Follow-up [facility] advanced heart failure 6.  Return to clinic for follow-up in 3 months   No orders of the defined types were placed in this encounter.   Return in about 4 months (around 05/21/2024).   MARSA DOOMS, MD PhD [facility]

## 2024-01-24 ENCOUNTER — Inpatient Hospital Stay

## 2024-01-24 ENCOUNTER — Inpatient Hospital Stay
Admission: EM | Admit: 2024-01-24 | Discharge: 2024-01-30 | DRG: 291 | Disposition: A | Discharge status: Home / Self Care | Attending: Internal Medicine | Admitting: Internal Medicine

## 2024-01-24 ENCOUNTER — Other Ambulatory Visit: Payer: Self-pay

## 2024-01-24 ENCOUNTER — Emergency Department

## 2024-01-24 DIAGNOSIS — I5023 Acute on chronic systolic (congestive) heart failure: Secondary | ICD-10-CM | POA: Diagnosis present

## 2024-01-24 DIAGNOSIS — I2699 Other pulmonary embolism without acute cor pulmonale: Secondary | ICD-10-CM | POA: Diagnosis not present

## 2024-01-24 DIAGNOSIS — Z833 Family history of diabetes mellitus: Secondary | ICD-10-CM

## 2024-01-24 DIAGNOSIS — I4811 Longstanding persistent atrial fibrillation: Secondary | ICD-10-CM | POA: Diagnosis present

## 2024-01-24 DIAGNOSIS — Z881 Allergy status to other antibiotic agents status: Secondary | ICD-10-CM | POA: Diagnosis not present

## 2024-01-24 DIAGNOSIS — Z7901 Long term (current) use of anticoagulants: Secondary | ICD-10-CM

## 2024-01-24 DIAGNOSIS — J90 Pleural effusion, not elsewhere classified: Secondary | ICD-10-CM | POA: Diagnosis not present

## 2024-01-24 DIAGNOSIS — Z794 Long term (current) use of insulin: Secondary | ICD-10-CM | POA: Diagnosis not present

## 2024-01-24 DIAGNOSIS — I480 Paroxysmal atrial fibrillation: Secondary | ICD-10-CM

## 2024-01-24 DIAGNOSIS — J9601 Acute respiratory failure with hypoxia: Secondary | ICD-10-CM | POA: Diagnosis present

## 2024-01-24 DIAGNOSIS — J918 Pleural effusion in other conditions classified elsewhere: Secondary | ICD-10-CM | POA: Diagnosis present

## 2024-01-24 DIAGNOSIS — R001 Bradycardia, unspecified: Secondary | ICD-10-CM | POA: Diagnosis present

## 2024-01-24 DIAGNOSIS — I130 Hypertensive heart and chronic kidney disease with heart failure and stage 1 through stage 4 chronic kidney disease, or unspecified chronic kidney disease: Secondary | ICD-10-CM | POA: Diagnosis present

## 2024-01-24 DIAGNOSIS — Z7984 Long term (current) use of oral hypoglycemic drugs: Secondary | ICD-10-CM | POA: Diagnosis not present

## 2024-01-24 DIAGNOSIS — I428 Other cardiomyopathies: Secondary | ICD-10-CM | POA: Diagnosis present

## 2024-01-24 DIAGNOSIS — Z950 Presence of cardiac pacemaker: Secondary | ICD-10-CM | POA: Diagnosis not present

## 2024-01-24 DIAGNOSIS — Z1152 Encounter for screening for COVID-19: Secondary | ICD-10-CM | POA: Diagnosis not present

## 2024-01-24 DIAGNOSIS — Z79899 Other long term (current) drug therapy: Secondary | ICD-10-CM | POA: Diagnosis not present

## 2024-01-24 DIAGNOSIS — Z888 Allergy status to other drugs, medicaments and biological substances status: Secondary | ICD-10-CM

## 2024-01-24 DIAGNOSIS — I2489 Other forms of acute ischemic heart disease: Secondary | ICD-10-CM | POA: Diagnosis present

## 2024-01-24 DIAGNOSIS — Z8249 Family history of ischemic heart disease and other diseases of the circulatory system: Secondary | ICD-10-CM | POA: Diagnosis not present

## 2024-01-24 DIAGNOSIS — Z803 Family history of malignant neoplasm of breast: Secondary | ICD-10-CM

## 2024-01-24 DIAGNOSIS — I509 Heart failure, unspecified: Secondary | ICD-10-CM

## 2024-01-24 DIAGNOSIS — N1832 Chronic kidney disease, stage 3b: Secondary | ICD-10-CM | POA: Diagnosis present

## 2024-01-24 DIAGNOSIS — Z9049 Acquired absence of other specified parts of digestive tract: Secondary | ICD-10-CM

## 2024-01-24 DIAGNOSIS — Z90710 Acquired absence of both cervix and uterus: Secondary | ICD-10-CM | POA: Diagnosis not present

## 2024-01-24 DIAGNOSIS — N179 Acute kidney failure, unspecified: Secondary | ICD-10-CM

## 2024-01-24 DIAGNOSIS — E1165 Type 2 diabetes mellitus with hyperglycemia: Secondary | ICD-10-CM | POA: Diagnosis not present

## 2024-01-24 DIAGNOSIS — I2694 Multiple subsegmental pulmonary emboli without acute cor pulmonale: Secondary | ICD-10-CM | POA: Diagnosis present

## 2024-01-24 DIAGNOSIS — N189 Chronic kidney disease, unspecified: Secondary | ICD-10-CM | POA: Diagnosis not present

## 2024-01-24 DIAGNOSIS — E1122 Type 2 diabetes mellitus with diabetic chronic kidney disease: Secondary | ICD-10-CM | POA: Diagnosis present

## 2024-01-24 DIAGNOSIS — M8580 Other specified disorders of bone density and structure, unspecified site: Secondary | ICD-10-CM | POA: Diagnosis present

## 2024-01-24 DIAGNOSIS — Z823 Family history of stroke: Secondary | ICD-10-CM

## 2024-01-24 DIAGNOSIS — Z9889 Other specified postprocedural states: Secondary | ICD-10-CM

## 2024-01-24 LAB — CBC
HCT: 45.5 % (ref 36.0–46.0)
Hemoglobin: 14.2 g/dL (ref 12.0–15.0)
MCH: 28.1 pg (ref 26.0–34.0)
MCHC: 31.2 g/dL (ref 30.0–36.0)
MCV: 90.1 fL (ref 80.0–100.0)
Platelets: 239 K/uL (ref 150–400)
RBC: 5.05 MIL/uL (ref 3.87–5.11)
RDW: 16.4 % — ABNORMAL HIGH (ref 11.5–15.5)
WBC: 8.4 K/uL (ref 4.0–10.5)
nRBC: 0 % (ref 0.0–0.2)

## 2024-01-24 LAB — RESP PANEL BY RT-PCR (RSV, FLU A&B, COVID)  RVPGX2
Influenza A by PCR: NEGATIVE
Influenza B by PCR: NEGATIVE
Resp Syncytial Virus by PCR: NEGATIVE
SARS Coronavirus 2 by RT PCR: NEGATIVE

## 2024-01-24 LAB — D-DIMER, QUANTITATIVE: D-Dimer, Quant: 2.82 ug{FEU}/mL — ABNORMAL HIGH (ref 0.00–0.50)

## 2024-01-24 LAB — APTT
aPTT: 48 s — ABNORMAL HIGH (ref 24–36)
aPTT: 108 s — ABNORMAL HIGH (ref 24–36)

## 2024-01-24 LAB — TROPONIN I (HIGH SENSITIVITY)
Troponin I (High Sensitivity): 31 ng/L — ABNORMAL HIGH (ref <18)
Troponin I (High Sensitivity): 28 ng/L — ABNORMAL HIGH (ref <18)

## 2024-01-24 LAB — COMPREHENSIVE METABOLIC PANEL WITH GFR
ALT: 9 U/L (ref 0–44)
AST: 18 U/L (ref 15–41)
Albumin: 3.4 g/dL — ABNORMAL LOW (ref 3.5–5.0)
Alkaline Phosphatase: 61 U/L (ref 38–126)
Anion gap: 11 (ref 5–15)
BUN: 29 mg/dL — ABNORMAL HIGH (ref 8–23)
CO2: 24 mmol/L (ref 22–32)
Calcium: 8.8 mg/dL — ABNORMAL LOW (ref 8.9–10.3)
Chloride: 102 mmol/L (ref 98–111)
Creatinine, Ser: 1.24 mg/dL — ABNORMAL HIGH (ref 0.44–1.00)
GFR, Estimated: 45 mL/min — ABNORMAL LOW (ref >60)
Glucose, Bld: 152 mg/dL — ABNORMAL HIGH (ref 70–99)
Potassium: 4.4 mmol/L (ref 3.5–5.1)
Sodium: 137 mmol/L (ref 135–145)
Total Bilirubin: 1.4 mg/dL — ABNORMAL HIGH (ref 0.0–1.2)
Total Protein: 8 g/dL (ref 6.5–8.1)

## 2024-01-24 LAB — CBG MONITORING, ED
Glucose-Capillary: 115 mg/dL — ABNORMAL HIGH (ref 70–99)
Glucose-Capillary: 119 mg/dL — ABNORMAL HIGH (ref 70–99)

## 2024-01-24 LAB — DIGOXIN LEVEL: Digoxin Level: 2.3 ng/mL — ABNORMAL HIGH (ref 0.8–2.0)

## 2024-01-24 LAB — PROTIME-INR
INR: 1.7 — ABNORMAL HIGH (ref 0.8–1.2)
Prothrombin Time: 21.1 s — ABNORMAL HIGH (ref 11.4–15.2)

## 2024-01-24 LAB — HEPARIN LEVEL (UNFRACTIONATED): Heparin Unfractionated: >1.1 [IU]/mL — ABNORMAL HIGH (ref 0.30–0.70)

## 2024-01-24 LAB — HEMOGLOBIN A1C
Hgb A1c MFr Bld: 8 % — ABNORMAL HIGH (ref 4.8–5.6)
Mean Plasma Glucose: 182.9 mg/dL

## 2024-01-24 LAB — BRAIN NATRIURETIC PEPTIDE: B Natriuretic Peptide: 2499 pg/mL — ABNORMAL HIGH (ref 0.0–100.0)

## 2024-01-24 LAB — GLUCOSE, CAPILLARY: Glucose-Capillary: 146 mg/dL — ABNORMAL HIGH (ref 70–99)

## 2024-01-24 MED ORDER — FUROSEMIDE 10 MG/ML IJ SOLN
40.0000 mg | Freq: Two times a day (BID) | INTRAMUSCULAR | Status: AC
  Administered 2024-01-24 – 2024-01-25 (×2): 40 mg via INTRAVENOUS
  Filled 2024-01-24 (×2): qty 4

## 2024-01-24 MED ORDER — GABAPENTIN 100 MG PO CAPS
100.0000 mg | ORAL_CAPSULE | Freq: Two times a day (BID) | ORAL | Status: DC
  Administered 2024-01-24 – 2024-01-30 (×12): 100 mg via ORAL
  Filled 2024-01-24 (×13): qty 1

## 2024-01-24 MED ORDER — SODIUM CHLORIDE 0.9% FLUSH
3.0000 mL | Freq: Two times a day (BID) | INTRAVENOUS | Status: DC
  Administered 2024-01-24 – 2024-01-29 (×10): 3 mL via INTRAVENOUS

## 2024-01-24 MED ORDER — CARVEDILOL 3.125 MG PO TABS
3.1250 mg | ORAL_TABLET | Freq: Two times a day (BID) | ORAL | Status: DC
  Administered 2024-01-24: 3.125 mg via ORAL
  Filled 2024-01-24 (×2): qty 1

## 2024-01-24 MED ORDER — ACETAMINOPHEN 325 MG PO TABS
650.0000 mg | ORAL_TABLET | ORAL | Status: DC | PRN
  Administered 2024-01-24 – 2024-01-30 (×7): 650 mg via ORAL
  Filled 2024-01-24 (×7): qty 2

## 2024-01-24 MED ORDER — IOHEXOL 350 MG/ML SOLN
75.0000 mL | Freq: Once | INTRAVENOUS | Status: AC | PRN
  Administered 2024-01-24: 75 mL via INTRAVENOUS

## 2024-01-24 MED ORDER — INSULIN ASPART 100 UNIT/ML IJ SOLN
0.0000 [IU] | Freq: Three times a day (TID) | INTRAMUSCULAR | Status: DC
  Administered 2024-01-25 – 2024-01-26 (×4): 2 [IU] via SUBCUTANEOUS
  Administered 2024-01-26: 3 [IU] via SUBCUTANEOUS
  Administered 2024-01-27: 2 [IU] via SUBCUTANEOUS
  Administered 2024-01-27: 5 [IU] via SUBCUTANEOUS
  Administered 2024-01-27 – 2024-01-28 (×3): 1 [IU] via SUBCUTANEOUS
  Administered 2024-01-28 – 2024-01-29 (×2): 3 [IU] via SUBCUTANEOUS
  Administered 2024-01-30: 1 [IU] via SUBCUTANEOUS
  Filled 2024-01-24 (×12): qty 1

## 2024-01-24 MED ORDER — SODIUM CHLORIDE 0.9 % IV SOLN: 250.0000 mL | INTRAVENOUS | Status: AC | PRN

## 2024-01-24 MED ORDER — FLUTICASONE PROPIONATE 50 MCG/ACT NA SUSP: 1.0000 | Freq: Two times a day (BID) | NASAL | Status: DC | PRN

## 2024-01-24 MED ORDER — SPIRONOLACTONE 12.5 MG HALF TABLET
12.5000 mg | ORAL_TABLET | Freq: Every day | ORAL | Status: DC
  Administered 2024-01-24 – 2024-01-26 (×3): 12.5 mg via ORAL
  Filled 2024-01-24 (×4): qty 1

## 2024-01-24 MED ORDER — SODIUM CHLORIDE 0.9% FLUSH: 3.0000 mL | INTRAVENOUS | Status: DC | PRN

## 2024-01-24 MED ORDER — ONDANSETRON HCL 4 MG/2ML IJ SOLN: 4.0000 mg | Freq: Four times a day (QID) | INTRAMUSCULAR | Status: DC | PRN

## 2024-01-24 MED ORDER — FUROSEMIDE 10 MG/ML IJ SOLN
40.0000 mg | Freq: Once | INTRAMUSCULAR | Status: AC
  Administered 2024-01-24: 40 mg via INTRAVENOUS
  Filled 2024-01-24: qty 4

## 2024-01-24 MED ORDER — ISOSORBIDE MONONITRATE ER 30 MG PO TB24
30.0000 mg | ORAL_TABLET | Freq: Every day | ORAL | Status: DC
Start: 2024-01-24 — End: 2024-01-30
  Administered 2024-01-24 – 2024-01-30 (×7): 30 mg via ORAL
  Filled 2024-01-24 (×7): qty 1

## 2024-01-24 MED ORDER — DIGOXIN 125 MCG PO TABS
0.1250 mg | ORAL_TABLET | Freq: Every day | ORAL | Status: DC
  Administered 2024-01-25 – 2024-01-26 (×2): 0.125 mg via ORAL
  Filled 2024-01-24 (×3): qty 1

## 2024-01-24 MED ORDER — HEPARIN (PORCINE) 25000 UT/250ML-% IV SOLN
900.0000 [IU]/h | INTRAVENOUS | Status: AC
  Administered 2024-01-24: 1000 [IU]/h via INTRAVENOUS
  Administered 2024-01-25: 900 [IU]/h via INTRAVENOUS
  Filled 2024-01-24 (×3): qty 250

## 2024-01-24 MED ORDER — HEPARIN BOLUS VIA INFUSION
3800.0000 [IU] | Freq: Once | INTRAVENOUS | Status: AC
  Administered 2024-01-24: 3800 [IU] via INTRAVENOUS
  Filled 2024-01-24: qty 3800

## 2024-01-24 NOTE — Progress Notes (Addendum)
 PHARMACY - ANTICOAGULATION CONSULT NOTE  Pharmacy Consult for Heparin  Indication: pulmonary embolus  Allergies  Allergen Reactions   Doxazosin Other (See Comments)    Cardura - cough   Doxycycline      Abdominal pain   Drug Class [Clindamycin/Lincomycin] Hives   Hydralazine Hcl Other (See Comments)    gout   Metformin  And Related Swelling   Ciprofloxacin Other (See Comments)    Numbness in face   Iodine Other (See Comments)   Pioglitazone Other (See Comments)   Sacubitril-Valsartan Cough    Pt states that it gave her a cough and chest palpitations   Semaglutide Nausea Only   Sulfamethoxazole-Trimethoprim Hives   Augmentin [Amoxicillin-Pot Clavulanate] Diarrhea   Dulaglutide Nausea Only   Hctz [Hydrochlorothiazide] Other (See Comments)    Gout   Losartan Diarrhea   Poison Oak Extract Rash   Red Dye #40 (Allura Red) Rash    Patient Measurements: Height: 5' 1 (154.9 cm) Weight: 69.4 kg (153 lb) IBW/kg (Calculated) : 47.8 HEPARIN  DW (KG): 62.6  Vital Signs: Temp: 97.6 F (36.4 C) (11/02 2028) BP: 148/77 (11/02 2028) Pulse Rate: 59 (11/02 2028)  Labs: Recent Labs    01/24/24 0800 01/24/24 1005 01/24/24 2040  HGB 14.2  --   --   HCT 45.5  --   --   PLT 239  --   --   APTT 48*  --  108*  LABPROT 21.1*  --   --   INR 1.7*  --   --   HEPARINUNFRC >1.10*  --   --   CREATININE 1.24*  --   --   TROPONINIHS 31* 28*  --     Estimated Creatinine Clearance: 34.9 mL/min (A) (by C-G formula based on SCr of 1.24 mg/dL (H)).   Medical History: Past Medical History:  Diagnosis Date   Allergic genetic state    Atrial fibrillation (HCC) 2010   Cardiomyopathy (HCC)    CHF (congestive heart failure) (HCC)    Diabetes mellitus without complication (HCC)    Dysrhythmia    Gout    H/O cardiac catheterization    Hypertension    Joint pain    Osteopenia    Presence of permanent cardiac pacemaker    Psoriasis     Medications:  Medications Prior to Admission   Medication Sig Dispense Refill Last Dose/Taking   apixaban (ELIQUIS) 5 MG TABS tablet Take 5 mg by mouth 2 (two) times daily.   01/24/2024   carvedilol  (COREG ) 3.125 MG tablet Take 1 tablet (3.125 mg total) by mouth 2 (two) times daily with a meal. 60 tablet 1 01/24/2024   digoxin  (LANOXIN ) 0.25 MG tablet Take 1 tablet (0.25 mg total) by mouth daily. 30 tablet 0 01/24/2024   furosemide  (LASIX ) 20 MG tablet Take 10 mg by mouth daily.   01/24/2024   gabapentin (NEURONTIN) 100 MG capsule Take 100 mg by mouth 2 (two) times daily.   01/24/2024   insulin  glargine (LANTUS  SOLOSTAR) 100 UNIT/ML Solostar Pen Inject 15 Units into the skin at bedtime. (Patient taking differently: Inject 20 Units into the skin at bedtime.) 4.5 mL 11 01/23/2024   Magnesium  Gluconate 550 MG TABS Take by mouth.   Taking   Vitamin D, Ergocalciferol, 2000 units CAPS Take 1 capsule by mouth daily.   Taking   acidophilus (RISAQUAD) CAPS capsule Take 2 capsules by mouth 3 (three) times daily. (Patient not taking: Reported on 03/04/2023) 30 capsule 0 Not Taking   albuterol  (VENTOLIN  HFA) 108 (90  Base) MCG/ACT inhaler Inhale 2 puffs into the lungs every 4 (four) hours as needed for shortness of breath. (Patient not taking: Reported on 03/04/2023)   Not Taking   amLODipine (NORVASC) 2.5 MG tablet Take 2.5 mg by mouth daily. (Patient not taking: Reported on 01/24/2024)   Not Taking   Calcium Carb-Cholecalciferol (CALCIUM 500+D PO) Take 1 tablet by mouth daily. (Patient not taking: Reported on 01/24/2024)   Not Taking   cloNIDine  (CATAPRES ) 0.2 MG tablet Take 0.2 mg by mouth 3 (three) times daily. (Patient not taking: Reported on 03/04/2023)   Not Taking   colchicine 0.6 MG tablet Take 0.6-1.8 mg by mouth as directed. (Patient not taking: Reported on 03/04/2023)   Not Taking   dexamethasone  (DECADRON ) 6 MG tablet Take 1 tablet (6 mg total) by mouth daily. (Patient not taking: Reported on 03/04/2023) 5 tablet 0 Not Taking   diltiazem  (TIAZAC ) 120  MG 24 hr capsule Take 120 mg by mouth daily. (Patient not taking: Reported on 03/04/2023)   Not Taking   fluticasone (FLONASE) 50 MCG/ACT nasal spray Place 1 spray into both nostrils 2 (two) times daily. (Patient not taking: Reported on 01/24/2024)   Not Taking   glimepiride  (AMARYL ) 4 MG tablet Take 4 mg by mouth 2 (two) times daily. (Patient not taking: Reported on 03/04/2023)   Not Taking   guaiFENesin -codeine 100-10 MG/5ML syrup Take 5 mLs by mouth every 4 (four) hours as needed for cough. (Patient not taking: Reported on 03/04/2023)   Not Taking   insulin  lispro (HUMALOG) 100 UNIT/ML injection Inject 12 Units into the skin 3 (three) times daily before meals. (Patient not taking: Reported on 01/24/2024)   Not Taking   Multiple Vitamin (MULTIVITAMIN WITH MINERALS) TABS tablet Take 1 tablet by mouth daily. (Patient not taking: Reported on 01/24/2024) 30 tablet 0 Not Taking   nystatin cream (MYCOSTATIN) Apply 1 Application topically 2 (two) times daily. (Patient not taking: Reported on 01/24/2024)   Not Taking   Semaglutide,0.25 or 0.5MG /DOS, (OZEMPIC, 0.25 OR 0.5 MG/DOSE,) 2 MG/1.5ML SOPN Inject into the skin. (Patient not taking: Reported on 03/04/2023)   Not Taking   spironolactone (ALDACTONE) 25 MG tablet Take 12.5 mg by mouth daily. (Patient not taking: Reported on 01/24/2024)   Not Taking   Turmeric 500 MG CAPS Take 1 capsule by mouth daily. (Patient not taking: Reported on 01/24/2024)   Not Taking   warfarin (COUMADIN ) 2 MG tablet Take 1 tablet (2 mg total) by mouth daily at 4 PM. (Patient not taking: Reported on 03/04/2023) 30 tablet 1 Not Taking   Scheduled:   carvedilol   3.125 mg Oral BID WC   [START ON 01/25/2024] digoxin   0.125 mg Oral Daily   furosemide   40 mg Intravenous BID   insulin  aspart  0-9 Units Subcutaneous TID WC   isosorbide mononitrate  30 mg Oral Daily   sodium chloride  flush  3 mL Intravenous Q12H   spironolactone  12.5 mg Oral Daily   Infusions:   sodium chloride       heparin  1,000 Units/hr (01/24/24 1115)   PRN:  Anti-infectives (From admission, onward)    None       Assessment: Patient is a 73 yo F presenting with SOB and an ongoing cough that has been present for several months. PMH significant for nonischemic cardiomyopathy, A-fib (on Eliquis), hypertension, T2DM, bradycardia resulted with s/p pacemaker. Pharmacy is consulted for heparin  dosing and monitoring. Last dose of Eliquis is unknown.  11/2: CT chest remarkable for  nonocclusive pulmonary emboli involving the distal right pulmonary artery, right lower lobe artery, and anterior and posterior segmental branches.   Baseline CBC is stable. Baseline aPTT, INR, and HL ordered as add on labs.    Goal of Therapy:  Heparin  level 0.3-0.7 units/ml aPTT 66-102 seconds Monitor platelets by anticoagulation protocol: Yes   Date Time aPTT Rate/Comment  11/2 2040 108 Supratherapeutic @1000  units/hr  Plan:  - Will decrease heparin  drip to 900 units/hr - Will check an aPTT level in 8 hours from change in infusion rate - Will monitor with aPTT until correlation with HL - Will monitor HL and CBC daily with AM labs while on heparin      Annabella LOISE Banks, PharmD Clinical Pharmacist 01/24/2024 10:04 PM

## 2024-01-24 NOTE — ED Provider Notes (Signed)
 [facility] Provider Note    Event Date/Time   First MD Initiated Contact with Patient 01/24/24 [id number]     (approximate)   History   Shortness of Breath   HPI  Deborah Dawson is a 73 year old female with history of nonischemic cardiomyopathy, A-fib, hypertension, T2DM, bradycardia resulted with s/p pacemaker presenting to the emergency department for evaluation of shortness of breath.  Patient reports that for the past several weeks she has had intermittent episodes of shortness of breath, worse last night.  No chest pain.  Has also had ongoing cough for several months.  Reports that she has a history of fluid buildup in her lungs for which she was on an increased dose of Lasix  previously.  Currently reports that she is taking 10 mg of Lasix  daily.  No noted leg swelling.      Physical Exam   Triage Vital Signs: ED Triage Vitals  Encounter Vitals Group     BP 01/24/24 0749 (!) 182/103     Girls Systolic BP Percentile --      Girls Diastolic BP Percentile --      Boys Systolic BP Percentile --      Boys Diastolic BP Percentile --      Pulse Rate 01/24/24 0749 64     Resp 01/24/24 0749 19     Temp 01/24/24 0749 98.1 F (36.7 C)     Temp src --      SpO2 01/24/24 0749 97 %     Weight 01/24/24 0750 153 lb (69.4 kg)     Height 01/24/24 0750 5' 1 (1.549 m)     Head Circumference --      Peak Flow --      Pain Score 01/24/24 0748 3     Pain Loc --      Pain Education --      Exclude from Growth Chart --     Most recent vital signs: Vitals:   01/24/24 0749 01/24/24 0825  BP: (!) 182/103   Pulse: 64   Resp: 19   Temp: 98.1 F (36.7 C)   SpO2: 97% 95%     General: Awake, interactive  CV:  Good peripheral perfusion Resp:  Diminished lung sounds at bases worse on the right, no appreciable wheezing, mildly labored respirations  Abd:  Nondistended.  Neuro:  Symmetric facial movement, fluid speech   ED Results / Procedures / Treatments    Labs (all labs ordered are listed, but only abnormal results are displayed) Labs Reviewed  CBC - Abnormal; Notable for the following components:      Result Value   RDW 16.4 (*)    All other components within normal limits  BRAIN NATRIURETIC PEPTIDE - Abnormal; Notable for the following components:   B Natriuretic Peptide 2,499.0 (*)    All other components within normal limits  D-DIMER, QUANTITATIVE - Abnormal; Notable for the following components:   D-Dimer, Quant 2.82 (*)    All other components within normal limits  COMPREHENSIVE METABOLIC PANEL WITH GFR - Abnormal; Notable for the following components:   Glucose, Bld 152 (*)    BUN 29 (*)    Creatinine, Ser 1.24 (*)    Calcium 8.8 (*)    Albumin 3.4 (*)    Total Bilirubin 1.4 (*)    GFR, Estimated 45 (*)    All other components within normal limits  DIGOXIN  LEVEL - Abnormal; Notable for the following components:   Digoxin  Level 2.3 (*)  All other components within normal limits  TROPONIN I (HIGH SENSITIVITY) - Abnormal; Notable for the following components:   Troponin I (High Sensitivity) 31 (*)    All other components within normal limits  RESP PANEL BY RT-PCR (RSV, FLU A&B, COVID)  RVPGX2  APTT  PROTIME-INR  HEPARIN  LEVEL (UNFRACTIONATED)  APTT  TROPONIN I (HIGH SENSITIVITY)     EKG EKG independently reviewed and interpreted by myself demonstrates:  EKG demonstrates paced rhythm at a rate of 61, QRS 156, QTc 461, no appreciable superimposed ischemia  RADIOLOGY Imaging independently reviewed and interpreted by myself demonstrates:  Chest x-Ambreen Tufte demonstrates right-sided pleural effusion with questionable superimposed infection CTA with right-sided PE, pleural effusion  Formal Radiology Read:  CT Angio Chest PE W and/or Wo Contrast Result Date: 01/24/2024 EXAM: CTA of the Chest with contrast for PE 01/24/2024 09:25:18 AM TECHNIQUE: CTA of the chest was performed without and with the administration of 75 mL of  iohexol  (OMNIPAQUE ) 350 MG/ML injection. Multiplanar reformatted images are provided for review. MIP images are provided for review. Automated exposure control, iterative reconstruction, and/or weight based adjustment of the mA/kV was utilized to reduce the radiation dose to as low as reasonably achievable. COMPARISON: PA and lateral radiographs of the chest dated 01/24/2024. CLINICAL HISTORY: Pulmonary embolism (PE) suspected, low to intermediate prob, positive D-dimer. FINDINGS: PULMONARY ARTERIES: Pulmonary arteries are adequately opacified for evaluation. There is nonocclusive thromboembolic disease present within the distal right pulmonary artery and within the right lower lobe artery and anterior segmental branch of the right lower lobe pulmonary artery. There is also thromboembolus within the posterior segmental artery. There is no definite thromboembolic disease demonstrated in the left lung. Main pulmonary artery is normal in caliber. MEDIASTINUM: The heart is enlarged. There is no evidence of right ventricular strain. There is mild calcific coronary artery disease. The thoracic aorta demonstrates mild-to-moderate calcific atheromatous disease. The ascending thoracic aorta measures approximately 3.3 cm in diameter. LYMPH NODES: There are numerous enlarged mediastinal lymph nodes. There is also right hilar lymphadenopathy. No axillary lymphadenopathy. LUNGS AND PLEURA: There is mild dependent atelectasis within the right lung. There is a moderate right-sided pleural effusion. No pneumothorax. No focal consolidation or pulmonary edema. UPPER ABDOMEN: Limited images of the upper abdomen are unremarkable. SOFT TISSUES AND BONES: No acute bone or soft tissue abnormality. IMPRESSION: 1. Nonocclusive pulmonary emboli involving the distal right pulmonary artery, right lower lobe artery, and anterior and posterior segmental branches; no left-sided PE identified. No CT evidence of right heart strain. 2. Moderate  right pleural effusion. 3. Cardiomegaly with mild coronary artery calcifications and mild-to-moderate thoracic aortic atherosclerosis. 4. Enlarged mediastinal and right hilar lymph nodes; recommend clinical correlation and follow-up per oncologic/infectious/inflammatory considerations. Electronically signed by: Evalene Coho MD 01/24/2024 09:51 AM EST RP Workstation: [id number]   DG Chest 2 View Result Date: 01/24/2024 CLINICAL DATA:  Shortness of breath. EXAM: CHEST - 2 VIEW COMPARISON:  08/08/2020 FINDINGS: Mild cardiomegaly, with dual lead pacemaker in place. Small to moderate right pleural effusion. Right basilar atelectasis versus infiltrate. IMPRESSION: Small to moderate right pleural effusion, with right basilar atelectasis versus infiltrate. Electronically Signed   By: Norleen DELENA Kil M.D.   On: 01/24/2024 08:29    PROCEDURES:  Critical Care performed: Yes, see critical care procedure note(s)  CRITICAL CARE Performed by: Nilsa Dade   Total critical care time: 32 minutes  Critical care time was exclusive of separately billable procedures and treating other patients.  Critical care was necessary to treat or  prevent imminent or life-threatening deterioration.  Critical care was time spent personally by me on the following activities: development of treatment plan with patient and/or surrogate as well as nursing, discussions with consultants, evaluation of patient's response to treatment, examination of patient, obtaining history from patient or surrogate, ordering and performing treatments and interventions, ordering and review of laboratory studies, ordering and review of radiographic studies, pulse oximetry and re-evaluation of patient's condition.   Procedures   MEDICATIONS ORDERED IN ED: Medications  heparin  bolus via infusion 3,800 Units (has no administration in time range)  heparin  ADULT infusion 100 units/mL (25000 units/250mL) (has no administration in time range)  iohexol   (OMNIPAQUE ) 350 MG/ML injection 75 mL (75 mLs Intravenous Contrast Given 01/24/24 0918)  furosemide  (LASIX ) injection 40 mg (40 mg Intravenous Given 01/24/24 1012)     IMPRESSION / MDM / ASSESSMENT AND PLAN / ED COURSE  I reviewed the triage vital signs and the nursing notes.  Differential diagnosis includes, but is not limited to, pneumonia, viral illness, recurrent pleural effusion, pulmonary edema, pleurisy, PE, ACS  Patient's presentation is most consistent with acute presentation with potential threat to life or bodily function.  73 year old female presenting to the emergency department for evaluation of shortness of breath.  Stable vitals on presentation.  Not hypoxic, but does have mildly labored respirations on exam.  X-Dorthey Depace does demonstrate right-sided pleural effusion, on review of records patient previously had issues with this, was increased on her dose of furosemide  with resolution.  Questionable superimposed infection.  Labs pending.  CBC with normal white blood cell count and hemoglobin, CMP with AKI with creatinine of 1.24.  Slightly elevated troponin at 31, elevated D-dimer and Doxy levels also low.  Negative viral swab.  BNP significantly elevated at 2500.  Ordered for dose of IV Lasix .  X-Kayen Grabel with pleural effusion, questionable superinfection but normal white blood cell count, no fever, CT without appreciable superimposed consolidation, lower suspicion infection will defer on antibiotics for now.  Clinical Course as of 01/24/24 1033  Sun Jan 24, 2024  0842 D-dimer, quantitative(!) Elevated D-dimer, will obtain CT of the chest to further evaluate. [NR]  [id number] Digoxin , Serum(!): 2.3 Digoxin  level elevated at 2.3, but fortunately under toxic range, suspect this is not contributing to patient's presentation. [NR]  [phone] Received call from radiologist that patient's CTA did demonstrate a right-sided PE without evidence of right heart strain.  Mildly elevated troponin and significantly  elevated BNP, but suspect this is more likely related to patient's CHF. [NR]  1005 Patient reassessed, updated on results of workup.  Of note, patient is on Eliquis for A-fib and reports she has been compliant with this.  Will place her on heparin .  Discussed admission and patient is agreeable.  Will reach out to hospitalist team. [NR]  1031 Case discussed with hospitalist team.  They will evaluate for anticipate admission. [NR]    Clinical Course User Index [NR] Levander Slate, MD     FINAL CLINICAL IMPRESSION(S) / ED DIAGNOSES   Final diagnoses:  Multiple subsegmental pulmonary emboli without acute cor pulmonale (HCC)  Pleural effusion on right     Rx / DC Orders   ED Discharge Orders     None        Note:  This document was prepared using Dragon voice recognition software and may include unintentional dictation errors.   Levander Slate, MD 01/24/24 [phone]

## 2024-01-24 NOTE — ED Notes (Signed)
 [place] at the bedside

## 2024-01-24 NOTE — H&P (Signed)
 History and Physical    Deborah Dawson FMW:[id number] DOB: [birth year] DOA: 01/24/2024  PCP: Sadie Manna, MD (Confirm with patient/family/NH records and if not entered, this has to be entered at [facility] point of entry) Patient coming from: Home  I have personally briefly reviewed patient's old medical records in [facility]  Chief Complaint: SOB  HPI: Deborah Dawson is a 73 y.o. female with medical history significant of PAF on Eliquis, chronic HFrEF LVEF 25% with recurrent right-sided pleural effusion, PPM-ICD, HTN, IDDM, gout, presented with worsening of shortness of breath and leg swelling.  Patient was just seen 5 days ago by her cardiology for routine visit, she was doing fine at that time.  Symptoms started 3 days ago patient started to feel increasing exertional dyspnea, and also developed orthopnea, in addition she also noticed increasing leg swelling left more than right.  However she has not tried to increase her p.o. Lasix  dosage to compensate her symptoms.  She denied any chest pain no cough.  Last night, she could not lie flat on the bed and decided to come to ED.  ED Course: Afebrile, [place] bradycardia heart rate 59, blood pressure 180/100, WBC 8.4 hemoglobin 14.2 BUN 29 creatinine 1.2 glucose 159.  CTA showed nonocclusive pulmonary emboli improved distal right pulmonary artery right lower lobe artery and anterior and posterior segment branch no left-sided PE and no signs of right heart strain.  Patient was given IV Lasix  40 mg x 1 and started on heparin  drip.  Review of Systems: As per HPI otherwise 14 point review of systems negative.    Past Medical History:  Diagnosis Date   Allergic genetic state    Atrial fibrillation (HCC) 2010   Cardiomyopathy (HCC)    CHF (congestive heart failure) (HCC)    Diabetes mellitus without complication (HCC)    Dysrhythmia    Gout    H/O cardiac catheterization    Hypertension    Joint pain    Osteopenia    Presence of permanent  cardiac pacemaker    Psoriasis     Past Surgical History:  Procedure Laterality Date   ABDOMINAL HYSTERECTOMY  1985   BREAST EXCISIONAL BIOPSY Left    negative years ago   BREAST SURGERY Left 1997   papilloma   CARDIAC CATHETERIZATION Left 08/01/2015   Procedure: Left Heart Cath and Coronary Angiography;  Surgeon: Vinie DELENA Jude, MD;  Location: [facility] INVASIVE CV LAB;  Service: Cardiovascular;  Laterality: Left;   CHOLECYSTECTOMY  1986   COLONOSCOPY WITH PROPOFOL  N/A 08/26/2017   Procedure: COLONOSCOPY WITH PROPOFOL ;  Surgeon: Toledo, Ladell POUR, MD;  Location: [facility] ENDOSCOPY;  Service: Gastroenterology;  Laterality: N/A;   ESOPHAGOGASTRODUODENOSCOPY (EGD) WITH PROPOFOL  N/A 08/26/2017   Procedure: ESOPHAGOGASTRODUODENOSCOPY (EGD) WITH PROPOFOL ;  Surgeon: Toledo, Ladell POUR, MD;  Location: [facility] ENDOSCOPY;  Service: Gastroenterology;  Laterality: N/A;   ESOPHAGOGASTRODUODENOSCOPY (EGD) WITH PROPOFOL  N/A 07/04/2019   Procedure: ESOPHAGOGASTRODUODENOSCOPY (EGD) WITH PROPOFOL ;  Surgeon: Toledo, Ladell POUR, MD;  Location: [facility] ENDOSCOPY;  Service: Gastroenterology;  Laterality: N/A;   ESOPHAGOGASTRODUODENOSCOPY (EGD) WITH PROPOFOL  N/A 06/12/2023   Procedure: ESOPHAGOGASTRODUODENOSCOPY (EGD) WITH PROPOFOL ;  Surgeon: Maryruth Ole DASEN, MD;  Location: [facility] ENDOSCOPY;  Service: Endoscopy;  Laterality: N/A;  DM   TUBAL LIGATION  1981     reports that she has never smoked. She has never used smokeless tobacco. She reports that she does not drink alcohol and does not use drugs.  Allergies  Allergen Reactions   Doxazosin Other (See Comments)  Cardura - cough   Doxycycline      Abdominal pain   Drug Class [Clindamycin/Lincomycin] Hives   Hydralazine Hcl Other (See Comments)    gout   Metformin  And Related Swelling   Ciprofloxacin Other (See Comments)    Numbness in face   Iodine Other (See Comments)   Pioglitazone Other (See Comments)   Sacubitril-Valsartan Cough    Pt states that it gave her a  cough and chest palpitations   Semaglutide Nausea Only   Sulfamethoxazole-Trimethoprim Hives   Augmentin [Amoxicillin-Pot Clavulanate] Diarrhea   Dulaglutide Nausea Only   Hctz [Hydrochlorothiazide] Other (See Comments)    Gout   Losartan Diarrhea   Poison Oak Extract Rash   Red Dye #40 (Allura Red) Rash    Family History  Problem Relation Age of Onset   Cancer Mother 66       breast   Diabetes Mother    Breast cancer Mother 38   Coronary artery disease Mother    Stroke Father    Coronary artery disease Father    Diabetes Son      Prior to Admission medications   Medication Sig Start Date End Date Taking? Authorizing Provider  amLODipine (NORVASC) 2.5 MG tablet Take 2.5 mg by mouth daily. 11/07/23  Yes [provider]  gabapentin (NEURONTIN) 100 MG capsule Take 100 mg by mouth 2 (two) times daily. 09/28/23  Yes [provider]  acidophilus (RISAQUAD) CAPS capsule Take 2 capsules by mouth 3 (three) times daily. Patient not taking: Reported on 03/04/2023 08/12/20   Regalado, Belkys A, MD  albuterol  (VENTOLIN  HFA) 108 (90 Base) MCG/ACT inhaler Inhale 2 puffs into the lungs every 4 (four) hours as needed for shortness of breath. Patient not taking: Reported on 03/04/2023 07/31/20   [provider]  apixaban (ELIQUIS) 5 MG TABS tablet Take 5 mg by mouth 2 (two) times daily.    [provider]  Calcium Carb-Cholecalciferol (CALCIUM 500+D PO) Take 1 tablet by mouth daily.    [provider]  carvedilol  (COREG ) 3.125 MG tablet Take 1 tablet (3.125 mg total) by mouth 2 (two) times daily with a meal. 08/12/20   Regalado, Belkys A, MD  cloNIDine  (CATAPRES ) 0.2 MG tablet Take 0.2 mg by mouth 3 (three) times daily. Patient not taking: Reported on 03/04/2023    [provider]  colchicine 0.6 MG tablet Take 0.6-1.8 mg by mouth as directed. Patient not taking: Reported on 03/04/2023 06/29/20   [provider]  dexamethasone  (DECADRON ) 6  MG tablet Take 1 tablet (6 mg total) by mouth daily. Patient not taking: Reported on 03/04/2023 08/12/20   Regalado, Belkys A, MD  digoxin  (LANOXIN ) 0.25 MG tablet Take 1 tablet (0.25 mg total) by mouth daily. 06/26/15   Vachhani, Vaibhavkumar, MD  diltiazem  (TIAZAC ) 120 MG 24 hr capsule Take 120 mg by mouth daily. Patient not taking: Reported on 03/04/2023 07/29/19   [provider]  fluticasone (FLONASE) 50 MCG/ACT nasal spray Place 1 spray into both nostrils 2 (two) times daily. Patient not taking: Reported on 01/24/2024 07/31/20   [provider]  furosemide  (LASIX ) 20 MG tablet Take 10 mg by mouth daily. 01/05/17   [provider]  glimepiride  (AMARYL ) 4 MG tablet Take 4 mg by mouth 2 (two) times daily. Patient not taking: Reported on 03/04/2023 12/10/16   [provider]  guaiFENesin -codeine 100-10 MG/5ML syrup Take 5 mLs by mouth every 4 (four) hours as needed for cough. Patient not taking: Reported on 03/04/2023  07/31/20   [provider]  insulin  glargine (LANTUS  SOLOSTAR) 100 UNIT/ML Solostar Pen Inject 15 Units into the skin at bedtime. 08/12/20 08/12/21  Regalado, Belkys A, MD  insulin  lispro (HUMALOG) 100 UNIT/ML injection Inject 12 Units into the skin 3 (three) times daily before meals.    [provider]  Magnesium  Gluconate 550 MG TABS Take by mouth.    [provider]  Multiple Vitamin (MULTIVITAMIN WITH MINERALS) TABS tablet Take 1 tablet by mouth daily. 08/13/20   Regalado, Belkys A, MD  nystatin cream (MYCOSTATIN) Apply 1 Application topically 2 (two) times daily. Patient not taking: Reported on 01/24/2024 11/30/23 11/29/24  [provider]  Semaglutide,0.25 or 0.5MG /DOS, (OZEMPIC, 0.25 OR 0.5 MG/DOSE,) 2 MG/1.5ML SOPN Inject into the skin. Patient not taking: Reported on 03/04/2023    [provider]  spironolactone (ALDACTONE) 25 MG tablet Take 12.5 mg by mouth daily. Patient not taking: Reported on 01/24/2024     [provider]  Turmeric 500 MG CAPS Take 1 capsule by mouth daily.    [provider]  Vitamin D, Ergocalciferol, 2000 units CAPS Take 1 capsule by mouth daily.    [provider]  warfarin (COUMADIN ) 2 MG tablet Take 1 tablet (2 mg total) by mouth daily at 4 PM. Patient not taking: Reported on 03/04/2023 08/12/20   Madelyne Owen LABOR, MD    Physical Exam: Vitals:   01/24/24 0749 01/24/24 0750 01/24/24 0825 01/24/24 1045  BP: (!) 182/103   (!) 174/103  Pulse: 64   (!) 59  Resp: 19   (!) 21  Temp: 98.1 F (36.7 C)     SpO2: 97%  95% 96%  Weight:  69.4 kg    Height:  5' 1 (1.549 m)      Constitutional: NAD, calm, comfortable Vitals:   01/24/24 0749 01/24/24 0750 01/24/24 0825 01/24/24 1045  BP: (!) 182/103   (!) 174/103  Pulse: 64   (!) 59  Resp: 19   (!) 21  Temp: 98.1 F (36.7 C)     SpO2: 97%  95% 96%  Weight:  69.4 kg    Height:  5' 1 (1.549 m)     Eyes: PERRL, lids and conjunctivae normal ENMT: Mucous membranes are moist. Posterior pharynx clear of any exudate or lesions.Normal dentition.  Neck: normal, supple, no masses, no thyromegaly Respiratory: Diminished breathing sound on the right side, no wheezing, crackles, increasing respiratory effort. No accessory muscle use.  Cardiovascular: Regular rate and rhythm, no murmurs / rubs / gallops. 2+ extremity edema. 2+ pedal pulses. No carotid bruits.  Abdomen: no tenderness, no masses palpated. No hepatosplenomegaly. Bowel sounds positive.  Musculoskeletal: no clubbing / cyanosis. No joint deformity upper and lower extremities. Good ROM, no contractures. Normal muscle tone.  Skin: no rashes, lesions, ulcers. No induration Neurologic: CN 2-12 grossly intact. Sensation intact, DTR normal. Strength 5/5 in all 4.  Psychiatric: Normal judgment and insight. Alert and oriented x 3. Normal mood.     Labs on Admission: I have personally reviewed following labs and imaging studies  CBC: Recent Labs   Lab 01/24/24 0800  WBC 8.4  HGB 14.2  HCT 45.5  MCV 90.1  PLT 239   Basic Metabolic Panel: Recent Labs  Lab 01/24/24 0800  NA 137  K 4.4  CL 102  CO2 24  GLUCOSE 152*  BUN 29*  CREATININE 1.24*  CALCIUM 8.8*   GFR: Estimated Creatinine Clearance: 34.9 mL/min (A) (by C-G formula based on  SCr of 1.24 mg/dL (H)). Liver Function Tests: Recent Labs  Lab 01/24/24 0800  AST 18  ALT 9  ALKPHOS 61  BILITOT 1.4*  PROT 8.0  ALBUMIN 3.4*   No results for input(s): LIPASE, AMYLASE in the last 168 hours. No results for input(s): AMMONIA in the last 168 hours. Coagulation Profile: Recent Labs  Lab 01/24/24 0800  INR 1.7*   Cardiac Enzymes: No results for input(s): CKTOTAL, CKMB, CKMBINDEX, TROPONINI in the last 168 hours. BNP (last 3 results) No results for input(s): PROBNP in the last 8760 hours. HbA1C: No results for input(s): HGBA1C in the last 72 hours. CBG: No results for input(s): GLUCAP in the last 168 hours. Lipid Profile: No results for input(s): CHOL, HDL, LDLCALC, TRIG, CHOLHDL, LDLDIRECT in the last 72 hours. Thyroid Function Tests: No results for input(s): TSH, T4TOTAL, FREET4, T3FREE, THYROIDAB in the last 72 hours. Anemia Panel: No results for input(s): VITAMINB12, FOLATE, FERRITIN, TIBC, IRON, RETICCTPCT in the last 72 hours. Urine analysis:    Component Value Date/Time   APPEARANCEUR Hazy (A) 04/19/2020 1411   GLUCOSEU 1+ (A) 04/19/2020 1411   BILIRUBINUR Negative 04/19/2020 1411   PROTEINUR 1+ (A) 04/19/2020 1411   NITRITE Negative 04/19/2020 1411   LEUKOCYTESUR 1+ (A) 04/19/2020 1411    Radiological Exams on Admission: CT Angio Chest PE W and/or Wo Contrast Addendum Date: 01/24/2024 *ADDENDUM #1ADDENDUM: The above findings were discussed with Dr. Levander at 9:53 am 01/24/24. ---------------------------------------------------- Electronically signed by: Evalene Coho MD 01/24/2024 10:49 AM  EST RP Workstation: [id number]   Result Date: 01/24/2024 ORIGINAL REPOR EXAM: CTA of the Chest with contrast for PE 01/24/2024 09:25:18 AM TECHNIQUE: CTA of the chest was performed without and with the administration of 75 mL of iohexol  (OMNIPAQUE ) 350 MG/ML injection. Multiplanar reformatted images are provided for review. MIP images are provided for review. Automated exposure control, iterative reconstruction, and/or weight based adjustment of the mA/kV was utilized to reduce the radiation dose to as low as reasonably achievable. COMPARISON: PA and lateral radiographs of the chest dated 01/24/2024. CLINICAL HISTORY: Pulmonary embolism (PE) suspected, low to intermediate prob, positive D-dimer. FINDINGS: PULMONARY ARTERIES: Pulmonary arteries are adequately opacified for evaluation. There is nonocclusive thromboembolic disease present within the distal right pulmonary artery and within the right lower lobe artery and anterior segmental branch of the right lower lobe pulmonary artery. There is also thromboembolus within the posterior segmental artery. There is no definite thromboembolic disease demonstrated in the left lung. Main pulmonary artery is normal in caliber. MEDIASTINUM: The heart is enlarged. There is no evidence of right ventricular strain. There is mild calcific coronary artery disease. The thoracic aorta demonstrates mild-to-moderate calcific atheromatous disease. The ascending thoracic aorta measures approximately 3.3 cm in diameter. LYMPH NODES: There are numerous enlarged mediastinal lymph nodes. There is also right hilar lymphadenopathy. No axillary lymphadenopathy. LUNGS AND PLEURA: There is mild dependent atelectasis within the right lung. There is a moderate right-sided pleural effusion. No pneumothorax. No focal consolidation or pulmonary edema. UPPER ABDOMEN: Limited images of the upper abdomen are unremarkable. SOFT TISSUES AND BONES: No acute bone or soft tissue abnormality. IMPRESSION: 1.  Nonocclusive pulmonary emboli involving the distal right pulmonary artery, right lower lobe artery, and anterior and posterior segmental branches; no left-sided PE identified. No CT evidence of right heart strain. 2. Moderate right pleural effusion. 3. Cardiomegaly with mild coronary artery calcifications and mild-to-moderate thoracic aortic atherosclerosis. 4. Enlarged mediastinal and right hilar lymph nodes; recommend clinical correlation and follow-up per  oncologic/infectious/inflammatory considerations. Electronically signed by: Evalene Coho MD 01/24/2024 09:51 AM EST RP Workstation: [id number]   DG Chest 2 View Result Date: 01/24/2024 CLINICAL DATA:  Shortness of breath. EXAM: CHEST - 2 VIEW COMPARISON:  08/08/2020 FINDINGS: Mild cardiomegaly, with dual lead pacemaker in place. Small to moderate right pleural effusion. Right basilar atelectasis versus infiltrate. IMPRESSION: Small to moderate right pleural effusion, with right basilar atelectasis versus infiltrate. Electronically Signed   By: Norleen DELENA Kil M.D.   On: 01/24/2024 08:29    EKG: Independently reviewed.  Ventricular paced  Assessment/Plan Principal Problem:   CHF (congestive heart failure) (HCC) Active Problems:   Acute respiratory failure with hypoxia (HCC)   Acute pulmonary embolism (HCC)  (please populate well all problems here in Problem List. (For example, if patient is on BP meds at home and you resume or decide to hold them, it is a problem that needs to be her. Same for CAD, COPD, HLD and so on)  Acute on chronic HFrEF decompensation - Significant fluid overload with increasing right-sided pleural effusion bilateral worsening edema - Continue Lasix  40 mg twice daily x 2 more doses and repeat chest x-ray tomorrow to decide further diuresis plan - Echocardiogram was done last week showing LVEF 25%, will not repeat echo stat.  RLL PE - Not entirely clear whether this should be considered Eliquis treatment failure.  As  PE cardiology only exist in the right lower lobe, per patient had a moderate to large amount of pleural effusion and likely atelectasis.  Case was discussed with on-call cardiology Dr. Fernand, who recommended heparin  drip for now.  HTN, uncontrolled - Will try to adjust her BP meds, unfortunately patient is already bradycardic, there is no room to increase her Coreg  dosage. - She is allergic to hydralazine.  For now we will start her on Imdur.  IDDM -SSI  PAF - Borderline elevation of digoxin  level, we will hold off digoxin  today - Recheck digoxin  level tomorrow - Heparin  drip for PE  DVT prophylaxis: Heparin  drip Code Status: Full code Family Communication: Husband at bedside Disposition Plan: Patient is sick with acute CHF, patient requiring IV diuresis, concurrent PE requiring parenteral anticoagulation, expect more than 2 midnight hospital stay Consults called: Cardiology Admission status: Telemetry admission   Cort ONEIDA Mana MD [facility] Pager [phone]  01/24/2024, 11:50 AM

## 2024-01-24 NOTE — ED Notes (Signed)
 Advised nurse that patient has ready bed

## 2024-01-24 NOTE — ED Notes (Signed)
 Pt being wheeled to x-ray before I could enter the room to assess.

## 2024-01-24 NOTE — ED Triage Notes (Addendum)
 Pt comes with sob that has been on and off. Pt states worse yesterday and today. Pt has hx of fluid on lungs. Pt states no cp. Pt states back pain. Pt is on fluid pill. Pt denies any known swelling.   Pt has hx of xhf, diabetes and Afib

## 2024-01-24 NOTE — ED Notes (Signed)
 CCMD notified of cardiac monitoring order.

## 2024-01-24 NOTE — Progress Notes (Signed)
 PHARMACY - ANTICOAGULATION CONSULT NOTE  Pharmacy Consult for Heparin  Indication: pulmonary embolus  Allergies  Allergen Reactions   Doxazosin Other (See Comments)    Cardura - cough   Doxycycline      Abdominal pain   Drug Class [Clindamycin/Lincomycin] Hives   Hydralazine Hcl Other (See Comments)    gout   Metformin  And Related Swelling   Ciprofloxacin Other (See Comments)    Numbness in face   Iodine Other (See Comments)   Augmentin [Amoxicillin-Pot Clavulanate] Diarrhea   Dulaglutide Nausea Only   Hctz [Hydrochlorothiazide] Other (See Comments)    Gout   Losartan Diarrhea   Poison Oak Extract Rash   Red Dye #40 (Allura Red) Rash    Patient Measurements: Height: 5' 1 (154.9 cm) Weight: 69.4 kg (153 lb) IBW/kg (Calculated) : 47.8 HEPARIN  DW (KG): 62.6  Vital Signs: Temp: 98.1 F (36.7 C) (11/02 0749) BP: 182/103 (11/02 0749) Pulse Rate: 64 (11/02 0749)  Labs: Recent Labs    01/24/24 0800  HGB 14.2  HCT 45.5  PLT 239  CREATININE 1.24*  TROPONINIHS 31*    Estimated Creatinine Clearance: 34.9 mL/min (A) (by C-G formula based on SCr of 1.24 mg/dL (H)).   Medical History: Past Medical History:  Diagnosis Date   Allergic genetic state    Atrial fibrillation (HCC) 2010   Cardiomyopathy (HCC)    CHF (congestive heart failure) (HCC)    Diabetes mellitus without complication (HCC)    Dysrhythmia    Gout    H/O cardiac catheterization    Hypertension    Joint pain    Osteopenia    Presence of permanent cardiac pacemaker    Psoriasis     Medications:  (Not in a hospital admission)  Scheduled:   furosemide   40 mg Intravenous Once   Infusions:  PRN:  Anti-infectives (From admission, onward)    None       Assessment: Patient is a 73 yo F presenting with SOB and an ongoing cough that has been present for several months. PMH significant for nonischemic cardiomyopathy, A-fib (on Eliquis), hypertension, T2DM, bradycardia resulted with s/p  pacemaker. Pharmacy is consulted for heparin  dosing and monitoring. Last dose of Eliquis is unknown.  11/2: CT chest remarkable for nonocclusive pulmonary emboli involving the distal right pulmonary artery, right lower lobe artery, and anterior and posterior segmental branches.   Baseline CBC is stable. Baseline aPTT, INR, and HL ordered as add on labs.    Goal of Therapy:  Heparin  level 0.3-0.7 units/ml aPTT 66-102 seconds Monitor platelets by anticoagulation protocol: Yes   Plan:  - Will give heparin  3800 units bolus x 1 - Will start heparin  gtt rate at 1000 units/hr  - Will check an aPTT level in 8 hours from start of gtt  - Will monitor with aPTT until correlation with HL - Will monitor HL and CBC daily with AM labs while on heparin      Ransom Blanch PGY-1 Pharmacy Resident  [place] [facility]  01/24/2024 10:15 AM

## 2024-01-25 ENCOUNTER — Telehealth (HOSPITAL_COMMUNITY): Payer: Self-pay

## 2024-01-25 ENCOUNTER — Other Ambulatory Visit (HOSPITAL_COMMUNITY): Payer: Self-pay

## 2024-01-25 ENCOUNTER — Inpatient Hospital Stay

## 2024-01-25 DIAGNOSIS — I5023 Acute on chronic systolic (congestive) heart failure: Secondary | ICD-10-CM | POA: Diagnosis not present

## 2024-01-25 DIAGNOSIS — J90 Pleural effusion, not elsewhere classified: Secondary | ICD-10-CM

## 2024-01-25 DIAGNOSIS — J9601 Acute respiratory failure with hypoxia: Secondary | ICD-10-CM | POA: Diagnosis not present

## 2024-01-25 DIAGNOSIS — I2699 Other pulmonary embolism without acute cor pulmonale: Secondary | ICD-10-CM

## 2024-01-25 LAB — GLUCOSE, CAPILLARY
Glucose-Capillary: 104 mg/dL — ABNORMAL HIGH (ref 70–99)
Glucose-Capillary: 172 mg/dL — ABNORMAL HIGH (ref 70–99)
Glucose-Capillary: 152 mg/dL — ABNORMAL HIGH (ref 70–99)
Glucose-Capillary: 177 mg/dL — ABNORMAL HIGH (ref 70–99)

## 2024-01-25 LAB — BASIC METABOLIC PANEL WITH GFR
Anion gap: 10 (ref 5–15)
BUN: 35 mg/dL — ABNORMAL HIGH (ref 8–23)
CO2: 24 mmol/L (ref 22–32)
Calcium: 8.5 mg/dL — ABNORMAL LOW (ref 8.9–10.3)
Chloride: 104 mmol/L (ref 98–111)
Creatinine, Ser: 1.39 mg/dL — ABNORMAL HIGH (ref 0.44–1.00)
GFR, Estimated: 40 mL/min — ABNORMAL LOW (ref >60)
Glucose, Bld: 80 mg/dL (ref 70–99)
Potassium: 4 mmol/L (ref 3.5–5.1)
Sodium: 138 mmol/L (ref 135–145)

## 2024-01-25 LAB — CBC
HCT: 38.4 % (ref 36.0–46.0)
Hemoglobin: 12.7 g/dL (ref 12.0–15.0)
MCH: 28.7 pg (ref 26.0–34.0)
MCHC: 33.1 g/dL (ref 30.0–36.0)
MCV: 86.7 fL (ref 80.0–100.0)
Platelets: 212 K/uL (ref 150–400)
RBC: 4.43 MIL/uL (ref 3.87–5.11)
RDW: 16 % — ABNORMAL HIGH (ref 11.5–15.5)
WBC: 6.9 K/uL (ref 4.0–10.5)
nRBC: 0 % (ref 0.0–0.2)

## 2024-01-25 LAB — APTT
aPTT: 87 s — ABNORMAL HIGH (ref 24–36)
aPTT: 86 s — ABNORMAL HIGH (ref 24–36)

## 2024-01-25 LAB — HEPARIN LEVEL (UNFRACTIONATED): Heparin Unfractionated: >1.1 [IU]/mL — ABNORMAL HIGH (ref 0.30–0.70)

## 2024-01-25 LAB — DIGOXIN LEVEL: Digoxin Level: 1.6 ng/mL (ref 0.8–2.0)

## 2024-01-25 MED ORDER — RIVAROXABAN 20 MG PO TABS: 20.0000 mg | ORAL_TABLET | Freq: Every day | ORAL | Status: DC

## 2024-01-25 MED ORDER — RIVAROXABAN 15 MG PO TABS
15.0000 mg | ORAL_TABLET | Freq: Two times a day (BID) | ORAL | Status: DC
  Filled 2024-01-25: qty 1

## 2024-01-25 MED ORDER — CARVEDILOL 6.25 MG PO TABS
6.2500 mg | ORAL_TABLET | Freq: Two times a day (BID) | ORAL | Status: DC
Start: 2024-01-25 — End: 2024-01-28
  Administered 2024-01-25 – 2024-01-27 (×5): 6.25 mg via ORAL
  Filled 2024-01-25 (×6): qty 1

## 2024-01-25 MED ORDER — CARVEDILOL 3.125 MG PO TABS
3.1250 mg | ORAL_TABLET | Freq: Once | ORAL | Status: AC
  Administered 2024-01-25: 3.125 mg via ORAL
  Filled 2024-01-25: qty 1

## 2024-01-25 MED ORDER — FUROSEMIDE 10 MG/ML IJ SOLN
40.0000 mg | Freq: Two times a day (BID) | INTRAMUSCULAR | Status: DC
  Administered 2024-01-25: 40 mg via INTRAVENOUS
  Filled 2024-01-25: qty 4

## 2024-01-25 NOTE — Telephone Encounter (Signed)
 Pharmacy Patient Advocate Encounter  Insurance verification completed.    The patient is insured through [facility]. Patient has Medicare and is not eligible for a copay card, but may be able to apply for patient assistance or Medicare RX Payment Plan (Patient Must reach out to their plan, if eligible for payment plan), if available.    Ran test claim for Xarelto Starter Pack and the current 30 day co-pay is $0.  Ran test claim for Xarelto 20mg  and the current 30 day co-pay is $0.   This test claim was processed through [employer]- copay amounts may vary at other pharmacies due to [employer], or as the patient moves through the different stages of their insurance plan.

## 2024-01-25 NOTE — Progress Notes (Signed)
 PT Cancellation Note  Patient Details Name: Deborah Dawson MRN: [id number] DOB: 03/28/1950   Cancelled Treatment:    Reason Eval/Treat Not Completed: Patient not medically ready. Chart reviewed. Pt with acute PE and started on heparin , however per protocol pt on hold for 24 hours after heparin  drip begins. Will attempt if time allows.   Deborah Dawson, SPT   Deborah Dawson 01/25/2024, 8:35 AM

## 2024-01-25 NOTE — Progress Notes (Signed)
 PHARMACY - ANTICOAGULATION CONSULT NOTE  Pharmacy Consult for Heparin  Indication: pulmonary embolus  Allergies  Allergen Reactions   Doxazosin Other (See Comments)    Cardura - cough   Doxycycline      Abdominal pain   Drug Class [Clindamycin/Lincomycin] Hives   Hydralazine Hcl Other (See Comments)    gout   Metformin  And Related Swelling   Ciprofloxacin Other (See Comments)    Numbness in face   Iodine Other (See Comments)   Pioglitazone Other (See Comments)   Sacubitril-Valsartan Cough    Pt states that it gave her a cough and chest palpitations   Semaglutide Nausea Only   Sulfamethoxazole-Trimethoprim Hives   Augmentin [Amoxicillin-Pot Clavulanate] Diarrhea   Dulaglutide Nausea Only   Hctz [Hydrochlorothiazide] Other (See Comments)    Gout   Losartan Diarrhea   Poison Oak Extract Rash   Red Dye #40 (Allura Red) Rash    Patient Measurements: Height: 5' 1 (154.9 cm) Weight: 67.7 kg (149 lb 4 oz) IBW/kg (Calculated) : 47.8 HEPARIN  DW (KG): 62.6  Vital Signs: Temp: 98 F (36.7 C) (11/03 0343) BP: 140/72 (11/03 0343) Pulse Rate: 66 (11/03 0343)  Labs: Recent Labs    01/24/24 0800 01/24/24 1005 01/24/24 2040 01/25/24 0327 01/25/24 0621  HGB 14.2  --   --  12.7  --   HCT 45.5  --   --  38.4  --   PLT 239  --   --  212  --   APTT 48*  --  108*  --  87*  LABPROT 21.1*  --   --   --   --   INR 1.7*  --   --   --   --   HEPARINUNFRC >1.10*  --   --  >1.10*  --   CREATININE 1.24*  --   --  1.39*  --   TROPONINIHS 31* 28*  --   --   --     Estimated Creatinine Clearance: 30.8 mL/min (A) (by C-G formula based on SCr of 1.39 mg/dL (H)).   Medical History: Past Medical History:  Diagnosis Date   Allergic genetic state    Atrial fibrillation (HCC) 2010   Cardiomyopathy (HCC)    CHF (congestive heart failure) (HCC)    Diabetes mellitus without complication (HCC)    Dysrhythmia    Gout    H/O cardiac catheterization    Hypertension    Joint pain     Osteopenia    Presence of permanent cardiac pacemaker    Psoriasis     Medications:  Medications Prior to Admission  Medication Sig Dispense Refill Last Dose/Taking   apixaban (ELIQUIS) 5 MG TABS tablet Take 5 mg by mouth 2 (two) times daily.   01/24/2024   carvedilol  (COREG ) 3.125 MG tablet Take 1 tablet (3.125 mg total) by mouth 2 (two) times daily with a meal. 60 tablet 1 01/24/2024   digoxin  (LANOXIN ) 0.25 MG tablet Take 1 tablet (0.25 mg total) by mouth daily. 30 tablet 0 01/24/2024   furosemide  (LASIX ) 20 MG tablet Take 10 mg by mouth daily.   01/24/2024   gabapentin (NEURONTIN) 100 MG capsule Take 100 mg by mouth 2 (two) times daily.   01/24/2024   insulin  glargine (LANTUS  SOLOSTAR) 100 UNIT/ML Solostar Pen Inject 15 Units into the skin at bedtime. (Patient taking differently: Inject 20 Units into the skin at bedtime.) 4.5 mL 11 01/23/2024   Magnesium  Gluconate 550 MG TABS Take by mouth.   Taking  Vitamin D, Ergocalciferol, 2000 units CAPS Take 1 capsule by mouth daily.   Taking   acidophilus (RISAQUAD) CAPS capsule Take 2 capsules by mouth 3 (three) times daily. (Patient not taking: Reported on 03/04/2023) 30 capsule 0 Not Taking   albuterol  (VENTOLIN  HFA) 108 (90 Base) MCG/ACT inhaler Inhale 2 puffs into the lungs every 4 (four) hours as needed for shortness of breath. (Patient not taking: Reported on 03/04/2023)   Not Taking   amLODipine (NORVASC) 2.5 MG tablet Take 2.5 mg by mouth daily. (Patient not taking: Reported on 01/24/2024)   Not Taking   Calcium Carb-Cholecalciferol (CALCIUM 500+D PO) Take 1 tablet by mouth daily. (Patient not taking: Reported on 01/24/2024)   Not Taking   cloNIDine  (CATAPRES ) 0.2 MG tablet Take 0.2 mg by mouth 3 (three) times daily. (Patient not taking: Reported on 03/04/2023)   Not Taking   colchicine 0.6 MG tablet Take 0.6-1.8 mg by mouth as directed. (Patient not taking: Reported on 03/04/2023)   Not Taking   dexamethasone  (DECADRON ) 6 MG tablet Take 1 tablet  (6 mg total) by mouth daily. (Patient not taking: Reported on 03/04/2023) 5 tablet 0 Not Taking   diltiazem  (TIAZAC ) 120 MG 24 hr capsule Take 120 mg by mouth daily. (Patient not taking: Reported on 03/04/2023)   Not Taking   fluticasone (FLONASE) 50 MCG/ACT nasal spray Place 1 spray into both nostrils 2 (two) times daily. (Patient not taking: Reported on 01/24/2024)   Not Taking   glimepiride  (AMARYL ) 4 MG tablet Take 4 mg by mouth 2 (two) times daily. (Patient not taking: Reported on 03/04/2023)   Not Taking   guaiFENesin -codeine 100-10 MG/5ML syrup Take 5 mLs by mouth every 4 (four) hours as needed for cough. (Patient not taking: Reported on 03/04/2023)   Not Taking   insulin  lispro (HUMALOG) 100 UNIT/ML injection Inject 12 Units into the skin 3 (three) times daily before meals. (Patient not taking: Reported on 01/24/2024)   Not Taking   Multiple Vitamin (MULTIVITAMIN WITH MINERALS) TABS tablet Take 1 tablet by mouth daily. (Patient not taking: Reported on 01/24/2024) 30 tablet 0 Not Taking   nystatin cream (MYCOSTATIN) Apply 1 Application topically 2 (two) times daily. (Patient not taking: Reported on 01/24/2024)   Not Taking   Semaglutide,0.25 or 0.5MG /DOS, (OZEMPIC, 0.25 OR 0.5 MG/DOSE,) 2 MG/1.5ML SOPN Inject into the skin. (Patient not taking: Reported on 03/04/2023)   Not Taking   spironolactone (ALDACTONE) 25 MG tablet Take 12.5 mg by mouth daily. (Patient not taking: Reported on 01/24/2024)   Not Taking   Turmeric 500 MG CAPS Take 1 capsule by mouth daily. (Patient not taking: Reported on 01/24/2024)   Not Taking   warfarin (COUMADIN ) 2 MG tablet Take 1 tablet (2 mg total) by mouth daily at 4 PM. (Patient not taking: Reported on 03/04/2023) 30 tablet 1 Not Taking   Scheduled:   carvedilol   3.125 mg Oral BID WC   digoxin   0.125 mg Oral Daily   furosemide   40 mg Intravenous BID   gabapentin  100 mg Oral BID   insulin  aspart  0-9 Units Subcutaneous TID WC   isosorbide mononitrate  30 mg Oral  Daily   sodium chloride  flush  3 mL Intravenous Q12H   spironolactone  12.5 mg Oral Daily   Infusions:   sodium chloride      heparin  900 Units/hr (01/24/24 2227)   PRN:  Anti-infectives (From admission, onward)    None       Assessment: Patient is  a 73 yo F presenting with SOB and an ongoing cough that has been present for several months. PMH significant for nonischemic cardiomyopathy, A-fib (on Eliquis), hypertension, T2DM, bradycardia resulted with s/p pacemaker. Pharmacy is consulted for heparin  dosing and monitoring. Last dose of Eliquis is unknown.  11/2: CT chest remarkable for nonocclusive pulmonary emboli involving the distal right pulmonary artery, right lower lobe artery, and anterior and posterior segmental branches.   Baseline CBC is stable. Baseline aPTT, INR, and HL ordered as add on labs.    Goal of Therapy:  Heparin  level 0.3-0.7 units/ml aPTT 66-102 seconds Monitor platelets by anticoagulation protocol: Yes   Date Time aPTT Rate/Comment  11/2 2040 108 Supratherapeutic @1000  units/hr 11/3 9378 87 Therapeutic x1 @900  units/hr  Plan:  - Will continue heparin  drip at 900 units/hr - Will check a confirmatory aPTT level in 8 hours  - Will monitor with aPTT until correlation with HL - Will monitor HL and CBC daily with AM labs while on heparin      Onyx Schirmer A Kimiya Brunelle, PharmD Clinical Pharmacist 01/25/2024 7:16 AM

## 2024-01-25 NOTE — Plan of Care (Signed)

## 2024-01-25 NOTE — Progress Notes (Signed)
 [facility]  - [place] [facility]   PATIENT NAME: Deborah Dawson    MR#:  [id number]  DATE OF BIRTH:  Jan 22, 1951  SUBJECTIVE:  husband at bedside. Came in with increasing shortness of breath for last three days. Found to have PE. Denies any recent travel or illness. Denies any pain in calf muscle    VITALS:  Blood pressure (!) 149/68, pulse 62, temperature 97.9 F (36.6 C), resp. rate 15, height 5' 1 (1.549 m), weight 67.7 kg, SpO2 95%.  PHYSICAL EXAMINATION:   GENERAL:  73 y.o.-year-old patient with no acute distress.  LUNGS: decreased breath sounds bilaterally, no wheezing CARDIOVASCULAR: S1, S2 normal. No murmur   ABDOMEN: Soft, nontender, nondistended. Bowel sounds present.  EXTREMITIES: No  edema b/l.    NEUROLOGIC: nonfocal  patient is alert and awake SKIN: No obvious rash, lesion, or ulcer.   LABORATORY PANEL:  CBC Recent Labs  Lab 01/25/24 0327  WBC 6.9  HGB 12.7  HCT 38.4  PLT 212    Chemistries  Recent Labs  Lab 01/24/24 0800 01/25/24 0327  NA 137 138  K 4.4 4.0  CL 102 104  CO2 24 24  GLUCOSE 152* 80  BUN 29* 35*  CREATININE 1.24* 1.39*  CALCIUM 8.8* 8.5*  AST 18  --   ALT 9  --   ALKPHOS 61  --   BILITOT 1.4*  --    Cardiac Enzymes No results for input(s): TROPONINI in the last 168 hours. RADIOLOGY:    Assessment and Plan   Deborah Dawson is a 73 y.o. female with medical history significant of PAF on Eliquis, chronic HFrEF LVEF 25% with recurrent right-sided pleural effusion, PPM-ICD, HTN, IDDM, gout, presented with worsening of shortness of breath and leg swelling.  Symptoms started 3 days ago patient started to feel increasing exertional dyspnea, and also developed orthopnea, in addition she also noticed increasing leg swelling left more than right.   CTA showed nonocclusive pulmonary emboli improved distal right pulmonary artery right lower lobe artery and anterior and posterior segment branch no left-sided PE and no  signs of right heart strain.   Acute on chronic HFrEF decompensation - Significant fluid overload with increasing right-sided pleural effusion bilateral worsening edema - Continue Lasix  40 mg twice daily - Echocardiogram was done last week showing LVEF 25%, will not repeat echo stat. --[facility] cardiology input noted--increased BB   RLL PE - Not entirely clear whether this should be considered Eliquis treatment failure.  As PE cardiology only exist in the right lower lobe, per patient had a moderate to large amount of pleural effusion and likely atelectasis.   --cont heparin  drip for now--change to xarelto from am --LE US  negative for DVT   HTN, uncontrolled - Will try to adjust her BP meds, unfortunately patient is already bradycardic, there is no room to increase her Coreg  dosage. - She is allergic to hydralazine.  For now we will start her on Imdur.   IDDM -SSI   PAF Pt was on eliquis - Borderline elevation of digoxin  level, we will hold off digoxin  today - Heparin  drip for PE   Procedures: Family communication :husband Consults :[facility] cardiology CODE STATUS: full DVT Prophylaxis :hepsarin Level of care: Telemetry Status is: Inpatient Remains inpatient appropriate because: on heparin  gtt, iv diuresis    TOTAL TIME TAKING CARE OF THIS PATIENT: 40 minutes.  >50% time spent on counselling and coordination of care  Note: This dictation was prepared with Dragon dictation  along with smaller phrase technology. Any transcriptional errors that result from this process are unintentional.  Leita Blanch M.D    [facility]   CC: Primary care physician; Sadie Manna, MD

## 2024-01-25 NOTE — Progress Notes (Signed)
 PHARMACY - ANTICOAGULATION CONSULT NOTE  Pharmacy Consult for Heparin  Indication: pulmonary embolus  Allergies  Allergen Reactions   Doxazosin Other (See Comments)    Cardura - cough   Doxycycline      Abdominal pain   Drug Class [Clindamycin/Lincomycin] Hives   Hydralazine Hcl Other (See Comments)    gout   Metformin  And Related Swelling   Ciprofloxacin Other (See Comments)    Numbness in face   Iodine Other (See Comments)   Pioglitazone Other (See Comments)   Sacubitril-Valsartan Cough    Pt states that it gave her a cough and chest palpitations   Semaglutide Nausea Only   Sulfamethoxazole-Trimethoprim Hives   Augmentin [Amoxicillin-Pot Clavulanate] Diarrhea   Dulaglutide Nausea Only   Hctz [Hydrochlorothiazide] Other (See Comments)    Gout   Losartan Diarrhea   Poison Oak Extract Rash   Red Dye #40 (Allura Red) Rash    Patient Measurements: Height: 5' 1 (154.9 cm) Weight: 67.7 kg (149 lb 4 oz) IBW/kg (Calculated) : 47.8 HEPARIN  DW (KG): 62.6  Vital Signs: Temp: 97.9 F (36.6 C) (11/03 1307) BP: 149/68 (11/03 1307) Pulse Rate: 62 (11/03 1307)  Labs: Recent Labs    01/24/24 0800 01/24/24 1005 01/24/24 2040 01/25/24 0327 01/25/24 0621 01/25/24 1605  HGB 14.2  --   --  12.7  --   --   HCT 45.5  --   --  38.4  --   --   PLT 239  --   --  212  --   --   APTT 48*  --  108*  --  87* 86*  LABPROT 21.1*  --   --   --   --   --   INR 1.7*  --   --   --   --   --   HEPARINUNFRC >1.10*  --   --  >1.10*  --   --   CREATININE 1.24*  --   --  1.39*  --   --   TROPONINIHS 31* 28*  --   --   --   --     Estimated Creatinine Clearance: 30.8 mL/min (A) (by C-G formula based on SCr of 1.39 mg/dL (H)).   Medical History: Past Medical History:  Diagnosis Date   Allergic genetic state    Atrial fibrillation (HCC) 2010   Cardiomyopathy (HCC)    CHF (congestive heart failure) (HCC)    Diabetes mellitus without complication (HCC)    Dysrhythmia    Gout    H/O  cardiac catheterization    Hypertension    Joint pain    Osteopenia    Presence of permanent cardiac pacemaker    Psoriasis     Medications:  Medications Prior to Admission  Medication Sig Dispense Refill Last Dose/Taking   apixaban (ELIQUIS) 5 MG TABS tablet Take 5 mg by mouth 2 (two) times daily.   01/24/2024   carvedilol  (COREG ) 3.125 MG tablet Take 1 tablet (3.125 mg total) by mouth 2 (two) times daily with a meal. 60 tablet 1 01/24/2024   digoxin  (LANOXIN ) 0.25 MG tablet Take 1 tablet (0.25 mg total) by mouth daily. 30 tablet 0 01/24/2024   furosemide  (LASIX ) 20 MG tablet Take 10 mg by mouth daily.   01/24/2024   gabapentin (NEURONTIN) 100 MG capsule Take 100 mg by mouth 2 (two) times daily.   01/24/2024   insulin  glargine (LANTUS  SOLOSTAR) 100 UNIT/ML Solostar Pen Inject 15 Units into the skin at bedtime. (Patient taking differently:  Inject 20 Units into the skin at bedtime.) 4.5 mL 11 01/23/2024   Magnesium  Gluconate 550 MG TABS Take by mouth.   Taking   Vitamin D, Ergocalciferol, 2000 units CAPS Take 1 capsule by mouth daily.   Taking   acidophilus (RISAQUAD) CAPS capsule Take 2 capsules by mouth 3 (three) times daily. (Patient not taking: Reported on 03/04/2023) 30 capsule 0 Not Taking   albuterol  (VENTOLIN  HFA) 108 (90 Base) MCG/ACT inhaler Inhale 2 puffs into the lungs every 4 (four) hours as needed for shortness of breath. (Patient not taking: Reported on 03/04/2023)   Not Taking   amLODipine (NORVASC) 2.5 MG tablet Take 2.5 mg by mouth daily. (Patient not taking: Reported on 01/24/2024)   Not Taking   Calcium Carb-Cholecalciferol (CALCIUM 500+D PO) Take 1 tablet by mouth daily. (Patient not taking: Reported on 01/24/2024)   Not Taking   cloNIDine  (CATAPRES ) 0.2 MG tablet Take 0.2 mg by mouth 3 (three) times daily. (Patient not taking: Reported on 03/04/2023)   Not Taking   colchicine 0.6 MG tablet Take 0.6-1.8 mg by mouth as directed. (Patient not taking: Reported on 03/04/2023)   Not  Taking   dexamethasone  (DECADRON ) 6 MG tablet Take 1 tablet (6 mg total) by mouth daily. (Patient not taking: Reported on 03/04/2023) 5 tablet 0 Not Taking   diltiazem  (TIAZAC ) 120 MG 24 hr capsule Take 120 mg by mouth daily. (Patient not taking: Reported on 03/04/2023)   Not Taking   fluticasone (FLONASE) 50 MCG/ACT nasal spray Place 1 spray into both nostrils 2 (two) times daily. (Patient not taking: Reported on 01/24/2024)   Not Taking   glimepiride  (AMARYL ) 4 MG tablet Take 4 mg by mouth 2 (two) times daily. (Patient not taking: Reported on 03/04/2023)   Not Taking   guaiFENesin -codeine 100-10 MG/5ML syrup Take 5 mLs by mouth every 4 (four) hours as needed for cough. (Patient not taking: Reported on 03/04/2023)   Not Taking   insulin  lispro (HUMALOG) 100 UNIT/ML injection Inject 12 Units into the skin 3 (three) times daily before meals. (Patient not taking: Reported on 01/24/2024)   Not Taking   Multiple Vitamin (MULTIVITAMIN WITH MINERALS) TABS tablet Take 1 tablet by mouth daily. (Patient not taking: Reported on 01/24/2024) 30 tablet 0 Not Taking   nystatin cream (MYCOSTATIN) Apply 1 Application topically 2 (two) times daily. (Patient not taking: Reported on 01/24/2024)   Not Taking   Semaglutide,0.25 or 0.5MG /DOS, (OZEMPIC, 0.25 OR 0.5 MG/DOSE,) 2 MG/1.5ML SOPN Inject into the skin. (Patient not taking: Reported on 03/04/2023)   Not Taking   spironolactone (ALDACTONE) 25 MG tablet Take 12.5 mg by mouth daily. (Patient not taking: Reported on 01/24/2024)   Not Taking   Turmeric 500 MG CAPS Take 1 capsule by mouth daily. (Patient not taking: Reported on 01/24/2024)   Not Taking   warfarin (COUMADIN ) 2 MG tablet Take 1 tablet (2 mg total) by mouth daily at 4 PM. (Patient not taking: Reported on 03/04/2023) 30 tablet 1 Not Taking   Scheduled:   carvedilol   6.25 mg Oral BID WC   digoxin   0.125 mg Oral Daily   furosemide   40 mg Intravenous BID   gabapentin  100 mg Oral BID   insulin  aspart  0-9 Units  Subcutaneous TID WC   isosorbide mononitrate  30 mg Oral Daily   [START ON 01/26/2024] Rivaroxaban  15 mg Oral BID WC   Followed by   NOREEN ON 02/16/2024] rivaroxaban  20 mg Oral Q supper  sodium chloride  flush  3 mL Intravenous Q12H   spironolactone  12.5 mg Oral Daily   Infusions:   heparin  900 Units/hr (01/25/24 1049)   PRN:  Anti-infectives (From admission, onward)    None       Assessment: Patient is a 73 yo F presenting with SOB and an ongoing cough that has been present for several months. PMH significant for nonischemic cardiomyopathy, A-fib (on Eliquis), hypertension, T2DM, bradycardia resulted with s/p pacemaker. Pharmacy is consulted for heparin  dosing and monitoring. Last dose of Eliquis is unknown.  11/2: CT chest remarkable for nonocclusive pulmonary emboli involving the distal right pulmonary artery, right lower lobe artery, and anterior and posterior segmental branches.   Baseline CBC is stable. Baseline aPTT, INR, and HL ordered as add on labs.    Goal of Therapy:  Heparin  level 0.3-0.7 units/ml aPTT 66-102 seconds Monitor platelets by anticoagulation protocol: Yes   Date Time aPTT Rate/Comment  11/2 2040 108 Supratherapeutic @1000  units/hr 11/3 9378 87 Therapeutic x1 @900  units/hr 11/3 1605 86 Therapeutic x2 @900  units/hour  Plan:  - Will continue heparin  drip at 900 units/hr - Will check aPTT level in AM - Will monitor with aPTT until correlation with HL - Will monitor HL and CBC daily with AM labs while on heparin    Thank you for involving pharmacy in this patient's care.   Damien Napoleon, PharmD Clinical Pharmacist 01/25/2024 4:34 PM

## 2024-01-25 NOTE — Progress Notes (Signed)
 Heart Failure Navigator Progress Note  Assessed for Heart & Vascular TOC clinic readiness.  Patient does not meet criteria due to [facility] patient.   Navigator will sign off at this time.  Charmaine Pines, RN, BSN [facility] Heart Failure Navigator Secure Chat Only

## 2024-01-25 NOTE — Consult Note (Signed)
 [facility] CLINIC CARDIOLOGY CONSULT NOTE       Patient ID: Deborah Dawson MRN: [id number] DOB/AGE: [birth year] 75 y.o.  Admit date: 01/24/2024 Referring Physician Dr. Cort Mana Primary Physician Sadie Manna, MD  Primary Cardiologist Dr. Ammon Reason for Consultation acute heart failure, PE  HPI: Deborah Dawson is a 73 y.o. female  with a past medical history of chronic HFrEF, s/p CRT-P 07/2021, persistent atrial fibrillation, hypertension, type 2 diabetes who presented to the ED on 01/24/2024 for SOB and LE edema. Cardiology was consulted for further evaluation.   Patient with worsening shortness of breath and cough for the last few days.  Given persistence she decided to come in for evaluation.  Workup in the ED notable for creatinine 1.39, potassium 4.0, hemoglobin 12.7, WBC 6.9. Troponins 31 > 28, BNP 2500. EKG in the ED VP rate 61 bpm. CXR with R pleural effusion, CTA chest with multiple nonocclusive right sided pulmonary emboli, LE US  negative for DVT. Started on IV heparin  and IV lasix  in the ED.   At the time of my evaluation this AM, she is resting comfortably in hospital bed.  We discussed her symptoms in further detail.  She endorses cough which started on Saturday and yesterday noticed chest pressure and shortness of breath thus prompting her to come in for evaluation.  States overall she is feeling somewhat better today but still feels short of breath when she is talking.  Also endorses swelling worse in her right leg than her left leg prior to coming to the hospital.  She denies any missed doses of Eliquis and states that she has been compliant with this medication.  Endorses that she previously has not tolerated multiple different medications for her heart failure.  Review of systems complete and found to be negative unless listed above    Past Medical History:  Diagnosis Date   Allergic genetic state    Atrial fibrillation (HCC) 2010   Cardiomyopathy (HCC)    CHF  (congestive heart failure) (HCC)    Diabetes mellitus without complication (HCC)    Dysrhythmia    Gout    H/O cardiac catheterization    Hypertension    Joint pain    Osteopenia    Presence of permanent cardiac pacemaker    Psoriasis     Past Surgical History:  Procedure Laterality Date   ABDOMINAL HYSTERECTOMY  1985   BREAST EXCISIONAL BIOPSY Left    negative years ago   BREAST SURGERY Left 1997   papilloma   CARDIAC CATHETERIZATION Left 08/01/2015   Procedure: Left Heart Cath and Coronary Angiography;  Surgeon: Vinie DELENA Jude, MD;  Location: [facility] INVASIVE CV LAB;  Service: Cardiovascular;  Laterality: Left;   CHOLECYSTECTOMY  1986   COLONOSCOPY WITH PROPOFOL  N/A 08/26/2017   Procedure: COLONOSCOPY WITH PROPOFOL ;  Surgeon: Toledo, Ladell POUR, MD;  Location: [facility] ENDOSCOPY;  Service: Gastroenterology;  Laterality: N/A;   ESOPHAGOGASTRODUODENOSCOPY (EGD) WITH PROPOFOL  N/A 08/26/2017   Procedure: ESOPHAGOGASTRODUODENOSCOPY (EGD) WITH PROPOFOL ;  Surgeon: Toledo, Ladell POUR, MD;  Location: [facility] ENDOSCOPY;  Service: Gastroenterology;  Laterality: N/A;   ESOPHAGOGASTRODUODENOSCOPY (EGD) WITH PROPOFOL  N/A 07/04/2019   Procedure: ESOPHAGOGASTRODUODENOSCOPY (EGD) WITH PROPOFOL ;  Surgeon: Toledo, Ladell POUR, MD;  Location: [facility] ENDOSCOPY;  Service: Gastroenterology;  Laterality: N/A;   ESOPHAGOGASTRODUODENOSCOPY (EGD) WITH PROPOFOL  N/A 06/12/2023   Procedure: ESOPHAGOGASTRODUODENOSCOPY (EGD) WITH PROPOFOL ;  Surgeon: Maryruth Ole DASEN, MD;  Location: [facility] ENDOSCOPY;  Service: Endoscopy;  Laterality: N/A;  DM   TUBAL LIGATION  1981  Medications Prior to Admission  Medication Sig Dispense Refill Last Dose/Taking   apixaban (ELIQUIS) 5 MG TABS tablet Take 5 mg by mouth 2 (two) times daily.   01/24/2024   carvedilol  (COREG ) 3.125 MG tablet Take 1 tablet (3.125 mg total) by mouth 2 (two) times daily with a meal. 60 tablet 1 01/24/2024   digoxin  (LANOXIN ) 0.25 MG tablet Take 1 tablet (0.25 mg total) by  mouth daily. 30 tablet 0 01/24/2024   furosemide  (LASIX ) 20 MG tablet Take 10 mg by mouth daily.   01/24/2024   gabapentin (NEURONTIN) 100 MG capsule Take 100 mg by mouth 2 (two) times daily.   01/24/2024   insulin  glargine (LANTUS  SOLOSTAR) 100 UNIT/ML Solostar Pen Inject 15 Units into the skin at bedtime. (Patient taking differently: Inject 20 Units into the skin at bedtime.) 4.5 mL 11 01/23/2024   Magnesium  Gluconate 550 MG TABS Take by mouth.   Taking   Vitamin D, Ergocalciferol, 2000 units CAPS Take 1 capsule by mouth daily.   Taking   acidophilus (RISAQUAD) CAPS capsule Take 2 capsules by mouth 3 (three) times daily. (Patient not taking: Reported on 03/04/2023) 30 capsule 0 Not Taking   albuterol  (VENTOLIN  HFA) 108 (90 Base) MCG/ACT inhaler Inhale 2 puffs into the lungs every 4 (four) hours as needed for shortness of breath. (Patient not taking: Reported on 03/04/2023)   Not Taking   amLODipine (NORVASC) 2.5 MG tablet Take 2.5 mg by mouth daily. (Patient not taking: Reported on 01/24/2024)   Not Taking   Calcium Carb-Cholecalciferol (CALCIUM 500+D PO) Take 1 tablet by mouth daily. (Patient not taking: Reported on 01/24/2024)   Not Taking   cloNIDine  (CATAPRES ) 0.2 MG tablet Take 0.2 mg by mouth 3 (three) times daily. (Patient not taking: Reported on 03/04/2023)   Not Taking   colchicine 0.6 MG tablet Take 0.6-1.8 mg by mouth as directed. (Patient not taking: Reported on 03/04/2023)   Not Taking   dexamethasone  (DECADRON ) 6 MG tablet Take 1 tablet (6 mg total) by mouth daily. (Patient not taking: Reported on 03/04/2023) 5 tablet 0 Not Taking   diltiazem  (TIAZAC ) 120 MG 24 hr capsule Take 120 mg by mouth daily. (Patient not taking: Reported on 03/04/2023)   Not Taking   fluticasone (FLONASE) 50 MCG/ACT nasal spray Place 1 spray into both nostrils 2 (two) times daily. (Patient not taking: Reported on 01/24/2024)   Not Taking   glimepiride  (AMARYL ) 4 MG tablet Take 4 mg by mouth 2 (two) times daily.  (Patient not taking: Reported on 03/04/2023)   Not Taking   guaiFENesin -codeine 100-10 MG/5ML syrup Take 5 mLs by mouth every 4 (four) hours as needed for cough. (Patient not taking: Reported on 03/04/2023)   Not Taking   insulin  lispro (HUMALOG) 100 UNIT/ML injection Inject 12 Units into the skin 3 (three) times daily before meals. (Patient not taking: Reported on 01/24/2024)   Not Taking   Multiple Vitamin (MULTIVITAMIN WITH MINERALS) TABS tablet Take 1 tablet by mouth daily. (Patient not taking: Reported on 01/24/2024) 30 tablet 0 Not Taking   nystatin cream (MYCOSTATIN) Apply 1 Application topically 2 (two) times daily. (Patient not taking: Reported on 01/24/2024)   Not Taking   Semaglutide,0.25 or 0.5MG /DOS, (OZEMPIC, 0.25 OR 0.5 MG/DOSE,) 2 MG/1.5ML SOPN Inject into the skin. (Patient not taking: Reported on 03/04/2023)   Not Taking   spironolactone (ALDACTONE) 25 MG tablet Take 12.5 mg by mouth daily. (Patient not taking: Reported on 01/24/2024)   Not Taking  Turmeric 500 MG CAPS Take 1 capsule by mouth daily. (Patient not taking: Reported on 01/24/2024)   Not Taking   warfarin (COUMADIN ) 2 MG tablet Take 1 tablet (2 mg total) by mouth daily at 4 PM. (Patient not taking: Reported on 03/04/2023) 30 tablet 1 Not Taking   Social History   Socioeconomic History   Marital status: Married    Spouse name: Not on file   Number of children: Not on file   Years of education: Not on file   Highest education level: Not on file  Occupational History   Not on file  Tobacco Use   Smoking status: Never   Smokeless tobacco: Never  Vaping Use   Vaping status: Never Used  Substance and Sexual Activity   Alcohol use: No    Alcohol/week: 0.0 standard drinks of alcohol   Drug use: No   Sexual activity: Not on file    Comment: Married   Other Topics Concern   Not on file  Social History Narrative   Not on file   Social Drivers of Health   Financial Resource Strain: Low Risk  (11/02/2023)   Received  from [facility]   Overall Financial Resource Strain (CARDIA)    Difficulty of Paying Living Expenses: Not hard at all  Food Insecurity: No Food Insecurity (01/24/2024)   Hunger Vital Sign    Worried About Running Out of Food in the Last Year: Never true    Ran Out of Food in the Last Year: Never true  Transportation Needs: No Transportation Needs (01/24/2024)   PRAPARE - Administrator, Civil Service (Medical): No    Lack of Transportation (Non-Medical): No  Physical Activity: Not on file  Stress: Not on file  Social Connections: Not on file  Intimate Partner Violence: Not At Risk (01/24/2024)   Humiliation, Afraid, Rape, and Kick questionnaire    Fear of Current or Ex-Partner: No    Emotionally Abused: No    Physically Abused: No    Sexually Abused: No    Family History  Problem Relation Age of Onset   Cancer Mother 4       breast   Diabetes Mother    Breast cancer Mother 66   Coronary artery disease Mother    Stroke Father    Coronary artery disease Father    Diabetes Son      Vitals:   01/24/24 2028 01/24/24 2303 01/25/24 0343 01/25/24 0632  BP: (!) 148/77 (!) 155/70 (!) 140/72   Pulse: (!) 59 60 66   Resp: 18 18 18    Temp: 97.6 F (36.4 C) 97.9 F (36.6 C) 98 F (36.7 C)   SpO2: 93% 94% 93%   Weight:    67.7 kg  Height:        PHYSICAL EXAM General: Well-appearing elderly female, well nourished, in no acute distress. HEENT: Normocephalic and atraumatic. Neck: No JVD.  Lungs: Normal respiratory effort on room air.  Bibasilar crackles.  Heart: HRRR. Normal S1 and S2 without gallops or murmurs.  Abdomen: Non-distended appearing.  Msk: Normal strength and tone for age. Extremities: Warm and well perfused. No clubbing, cyanosis.  Trace edema.  Neuro: Alert and oriented X 3. Psych: Answers questions appropriately.   Labs: Basic Metabolic Panel: Recent Labs    01/24/24 0800 01/25/24 0327  NA 137 138  K 4.4 4.0  CL 102 104   CO2 24 24  GLUCOSE 152* 80  BUN 29* 35*  CREATININE  1.24* 1.39*  CALCIUM 8.8* 8.5*   Liver Function Tests: Recent Labs    01/24/24 0800  AST 18  ALT 9  ALKPHOS 61  BILITOT 1.4*  PROT 8.0  ALBUMIN 3.4*   No results for input(s): LIPASE, AMYLASE in the last 72 hours. CBC: Recent Labs    01/24/24 0800 01/25/24 0327  WBC 8.4 6.9  HGB 14.2 12.7  HCT 45.5 38.4  MCV 90.1 86.7  PLT 239 212   Cardiac Enzymes: Recent Labs    01/24/24 0800 01/24/24 1005  TROPONINIHS 31* 28*   BNP: Recent Labs    01/24/24 0800  BNP 2,499.0*   D-Dimer: Recent Labs    01/24/24 0800  DDIMER 2.82*   Hemoglobin A1C: Recent Labs    01/24/24 1005  HGBA1C 8.0*   Fasting Lipid Panel: No results for input(s): CHOL, HDL, LDLCALC, TRIG, CHOLHDL, LDLDIRECT in the last 72 hours. Thyroid Function Tests: No results for input(s): TSH, T4TOTAL, T3FREE, THYROIDAB in the last 72 hours.  Invalid input(s): FREET3 Anemia Panel: No results for input(s): VITAMINB12, FOLATE, FERRITIN, TIBC, IRON, RETICCTPCT in the last 72 hours.   Radiology: US  Venous Img Lower Bilateral (DVT) Result Date: 01/24/2024 CLINICAL DATA:  Pulmonary embolism. EXAM: BILATERAL LOWER EXTREMITY VENOUS DOPPLER ULTRASOUND TECHNIQUE: Gray-scale sonography with compression, as well as color and duplex ultrasound, were performed to evaluate the deep venous system(s) from the level of the common femoral vein through the popliteal and proximal calf veins. COMPARISON:  None Available. FINDINGS: VENOUS Normal compressibility of the common femoral, superficial femoral, and popliteal veins, as well as the visualized calf veins. Visualized portions of profunda femoral vein and great saphenous vein unremarkable. No filling defects to suggest DVT on grayscale or color Doppler imaging. Doppler waveforms show normal direction of venous flow, normal respiratory plasticity and response to augmentation. Limited  views of the contralateral common femoral vein are unremarkable. OTHER None. Limitations: none IMPRESSION: Negative. Electronically Signed   By: Leita Birmingham M.D.   On: 01/24/2024 14:19   CT Angio Chest PE W and/or Wo Contrast Addendum Date: 01/24/2024 ** ADDENDUM #1 ** ADDENDUM: The above findings were discussed with Dr. Levander at 9:53 am 01/24/24. ---------------------------------------------------- Electronically signed by: Evalene Coho MD 01/24/2024 10:49 AM EST RP Workstation: [id number]   Result Date: 01/24/2024 ** ORIGINAL REPORT ** EXAM: CTA of the Chest with contrast for PE 01/24/2024 09:25:18 AM TECHNIQUE: CTA of the chest was performed without and with the administration of 75 mL of iohexol  (OMNIPAQUE ) 350 MG/ML injection. Multiplanar reformatted images are provided for review. MIP images are provided for review. Automated exposure control, iterative reconstruction, and/or weight based adjustment of the mA/kV was utilized to reduce the radiation dose to as low as reasonably achievable. COMPARISON: PA and lateral radiographs of the chest dated 01/24/2024. CLINICAL HISTORY: Pulmonary embolism (PE) suspected, low to intermediate prob, positive D-dimer. FINDINGS: PULMONARY ARTERIES: Pulmonary arteries are adequately opacified for evaluation. There is nonocclusive thromboembolic disease present within the distal right pulmonary artery and within the right lower lobe artery and anterior segmental branch of the right lower lobe pulmonary artery. There is also thromboembolus within the posterior segmental artery. There is no definite thromboembolic disease demonstrated in the left lung. Main pulmonary artery is normal in caliber. MEDIASTINUM: The heart is enlarged. There is no evidence of right ventricular strain. There is mild calcific coronary artery disease. The thoracic aorta demonstrates mild-to-moderate calcific atheromatous disease. The ascending thoracic aorta measures approximately 3.3 cm in  diameter. LYMPH NODES: There  are numerous enlarged mediastinal lymph nodes. There is also right hilar lymphadenopathy. No axillary lymphadenopathy. LUNGS AND PLEURA: There is mild dependent atelectasis within the right lung. There is a moderate right-sided pleural effusion. No pneumothorax. No focal consolidation or pulmonary edema. UPPER ABDOMEN: Limited images of the upper abdomen are unremarkable. SOFT TISSUES AND BONES: No acute bone or soft tissue abnormality. IMPRESSION: 1. Nonocclusive pulmonary emboli involving the distal right pulmonary artery, right lower lobe artery, and anterior and posterior segmental branches; no left-sided PE identified. No CT evidence of right heart strain. 2. Moderate right pleural effusion. 3. Cardiomegaly with mild coronary artery calcifications and mild-to-moderate thoracic aortic atherosclerosis. 4. Enlarged mediastinal and right hilar lymph nodes; recommend clinical correlation and follow-up per oncologic/infectious/inflammatory considerations. Electronically signed by: Evalene Coho MD 01/24/2024 09:51 AM EST RP Workstation: [id number]   DG Chest 2 View Result Date: 01/24/2024 CLINICAL DATA:  Shortness of breath. EXAM: CHEST - 2 VIEW COMPARISON:  08/08/2020 FINDINGS: Mild cardiomegaly, with dual lead pacemaker in place. Small to moderate right pleural effusion. Right basilar atelectasis versus infiltrate. IMPRESSION: Small to moderate right pleural effusion, with right basilar atelectasis versus infiltrate. Electronically Signed   By: Norleen DELENA Kil M.D.   On: 01/24/2024 08:29    ECHO 12/2023: SEVERE LEFT VENTRICULAR SYSTOLIC DYSFUNCTION WITH MILD LVH  ESTIMATED EF: 25%, CALC EF(2D): 25%  ELEVATED LA PRESSURES WITH DIASTOLIC DYSFUNCTION (GRADE 3)  NORMAL RIGHT VENTRICULAR SYSTOLIC FUNCTION  VALVULAR REGURGITATION: TRIVIAL AR, MILD MR, No PR, MODERATE TR                           ESTIMATED RVSP: 56 mmHg (ABNORMAL)  NO VALVULAR STENOSIS         TELEMETRY  (personally reviewed): Ventricular pacing rate 60s  EKG (personally reviewed): VP rate 61 bpm  Data reviewed by me 01/25/2024: last 24h vitals tele labs imaging I/O ED provider note, admission H&P  Principal Problem:   CHF (congestive heart failure) (HCC) Active Problems:   Acute respiratory failure with hypoxia (HCC)   Acute pulmonary embolism (HCC)    ASSESSMENT AND PLAN:  Deborah Dawson is a 73 y.o. female  with a past medical history of chronic HFrEF, persistent atrial fibrillation, hypertension, type 2 diabetes who presented to the ED on 01/24/2024 for SOB and LE edema. Cardiology was consulted for further evaluation.   # Acute on chronic HFrEF # Pulmonary emboli # Persistent atrial fibrillation # Hypertension Patient presented with worsening shortness of breath, lower extremity edema, cough.  Chest x-ray concerning for heart failure with elevated BNP at 2500.  CTA chest did reveal multiple right sided pulmonary emboli and she was started on IV heparin . - Continue IV heparin .  Pharmacy is checking cost of Xarelto. - Will repeat chest x-ray today to assess volume status.  Continue IV Lasix  for now. - Will increase carvedilol  to 6.25 mg twice daily for improved BP control, GDMT.  Continue spironolactone 12.5 mg daily.  Previously has not tolerated Entresto due to cough, Farxiga due to UTI and yeast infections, ARBs. -Minimal and flat troponin most consistent with demand/supply mismatch and not ACS in the setting of above.   This patient's plan of care was discussed and created with Dr. Florencio and he is in agreement.  Signed: Danita Bloch, PA-C  01/25/2024, 7:16 AM [facility] Cardiology

## 2024-01-26 ENCOUNTER — Inpatient Hospital Stay

## 2024-01-26 DIAGNOSIS — I2699 Other pulmonary embolism without acute cor pulmonale: Secondary | ICD-10-CM | POA: Diagnosis not present

## 2024-01-26 DIAGNOSIS — I5023 Acute on chronic systolic (congestive) heart failure: Secondary | ICD-10-CM | POA: Diagnosis not present

## 2024-01-26 DIAGNOSIS — J9601 Acute respiratory failure with hypoxia: Secondary | ICD-10-CM | POA: Diagnosis not present

## 2024-01-26 DIAGNOSIS — J90 Pleural effusion, not elsewhere classified: Secondary | ICD-10-CM | POA: Diagnosis not present

## 2024-01-26 LAB — GLUCOSE, CAPILLARY
Glucose-Capillary: 151 mg/dL — ABNORMAL HIGH (ref 70–99)
Glucose-Capillary: 179 mg/dL — ABNORMAL HIGH (ref 70–99)
Glucose-Capillary: 230 mg/dL — ABNORMAL HIGH (ref 70–99)
Glucose-Capillary: 214 mg/dL — ABNORMAL HIGH (ref 70–99)

## 2024-01-26 LAB — PROTEIN, PLEURAL OR PERITONEAL FLUID: Total protein, fluid: 3 g/dL

## 2024-01-26 LAB — BODY FLUID CELL COUNT WITH DIFFERENTIAL
Eos, Fluid: 0 %
Lymphs, Fluid: 86 %
Monocyte-Macrophage-Serous Fluid: 11 %
Neutrophil Count, Fluid: 3 %
Total Nucleated Cell Count, Fluid: 123 uL

## 2024-01-26 LAB — LACTATE DEHYDROGENASE, PLEURAL OR PERITONEAL FLUID: LD, Fluid: 79 U/L — ABNORMAL HIGH (ref 3–23)

## 2024-01-26 LAB — BASIC METABOLIC PANEL WITH GFR
Anion gap: 11 (ref 5–15)
BUN: 40 mg/dL — ABNORMAL HIGH (ref 8–23)
CO2: 24 mmol/L (ref 22–32)
Calcium: 8.5 mg/dL — ABNORMAL LOW (ref 8.9–10.3)
Chloride: 100 mmol/L (ref 98–111)
Creatinine, Ser: 1.93 mg/dL — ABNORMAL HIGH (ref 0.44–1.00)
GFR, Estimated: 27 mL/min — ABNORMAL LOW (ref >60)
Glucose, Bld: 161 mg/dL — ABNORMAL HIGH (ref 70–99)
Potassium: 4.4 mmol/L (ref 3.5–5.1)
Sodium: 135 mmol/L (ref 135–145)

## 2024-01-26 LAB — APTT: aPTT: 77 s — ABNORMAL HIGH (ref 24–36)

## 2024-01-26 LAB — HEPARIN LEVEL (UNFRACTIONATED): Heparin Unfractionated: >1.1 [IU]/mL — ABNORMAL HIGH (ref 0.30–0.70)

## 2024-01-26 MED ORDER — LIDOCAINE HCL (PF) 1 % IJ SOLN
10.0000 mL | Freq: Once | INTRAMUSCULAR | Status: AC
  Administered 2024-01-26: 10 mL
  Filled 2024-01-26: qty 10

## 2024-01-26 MED ORDER — HEPARIN (PORCINE) 25000 UT/250ML-% IV SOLN
1300.0000 [IU]/h | INTRAVENOUS | Status: DC
  Administered 2024-01-26 – 2024-01-27 (×2): 900 [IU]/h via INTRAVENOUS
  Administered 2024-01-28: 1000 [IU]/h via INTRAVENOUS
  Administered 2024-01-29: 1100 [IU]/h via INTRAVENOUS
  Filled 2024-01-26 (×3): qty 250

## 2024-01-26 NOTE — Progress Notes (Signed)
 Heart Failure Navigator Progress Note  Assessed for Heart & Vascular TOC clinic readiness.  Patient does not meet criteria due to current [facility] Cardiology patient.   Navigator will sign off at this time.  Roxy Horseman, RN, BSN [facility] Heart Failure Navigator Secure Chat Only

## 2024-01-26 NOTE — Progress Notes (Signed)
 Heart Failure Stewardship Pharmacy Note  PCP: Sadie Manna, MD PCP-Cardiologist: None  HPI: Deborah Dawson is a 73 y.o. female with chronic HFrEF, s/p CRT-P 07/2021, persistent atrial fibrillation, hypertension, type 2 diabetes who presented with shortness of breath. On admission, BNP was 2499, HS-troponin was 31, and digoxin  level was 2.3. Chest x-ray noted small to moderate right pleural effusion.  CTA 01/24/24 noted nonocclusive pulmonary emboli involving the distal right pulmonary artery. Right lower lobe artery, and anterior/posterior segmental branches with no evidence of RHS.  Pertinent cardiac history: TTE 06/2015 with LVEF of 20-25%. 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 TTE 07/2020 with LVEF improved ot 30-35% with mild RV dysfunction, mild MR, mild to moderate TR. In 07/2021 device upgraded to CRT-D with LBBB and dyssynchrony. TTE 12/2023 noted LVEF down again to 25% with G3DD, trivial AR, mild MR, moderate TR.  Pertinent Lab Values: Creatinine, Ser  Date Value Ref Range Status  01/26/2024 1.93 (H) 0.44 - 1.00 mg/dL Final   BUN  Date Value Ref Range Status  01/26/2024 40 (H) 8 - 23 mg/dL Final   Potassium  Date Value Ref Range Status  01/26/2024 4.4 3.5 - 5.1 mmol/L Final   Sodium  Date Value Ref Range Status  01/26/2024 135 135 - 145 mmol/L Final   B Natriuretic Peptide  Date Value Ref Range Status  01/24/2024 2,499.0 (H) 0.0 - 100.0 pg/mL Final    Comment:    Performed at [facility], [address] [place], [place] [address]   Magnesium   Date Value Ref Range Status  08/10/2020 2.0 1.7 - 2.4 mg/dL Final    Comment:    Performed at [facility], [address] [place], [place] [address]   Hgb A1c MFr Bld  Date Value Ref Range Status  01/24/2024 8.0 (H) 4.8 - 5.6 % Final    Comment:    (NOTE) Diagnosis of Diabetes The following HbA1c ranges recommended by the American Diabetes Association (ADA) may be used as an aid in the  diagnosis of diabetes mellitus.  Hemoglobin             Suggested A1C NGSP%              Diagnosis  <5.7                   Non Diabetic  5.7-6.4                Pre-Diabetic  >6.4                   Diabetic  <7.0                   Glycemic control for                       adults with diabetes.     Digoxin  Level  Date Value Ref Range Status  01/25/2024 1.6 0.8 - 2.0 ng/mL Final    Comment:    Performed at [facility], [address] [place], [place] [address]   TSH  Date Value Ref Range Status  06/25/2015 0.661 0.350 - 4.500 uIU/mL Final   LDH  Date Value Ref Range Status  08/08/2020 149 98 - 192 U/L Final    Comment:    Performed at [facility], [address] [place], [place] [address]    Vital Signs: Temp:  [97.7 F (36.5 C)-98.6 F (37 C)] 98.3 F (  36.8 C) (11/04 0726) Pulse Rate:  [60-92] 63 (11/04 0726) Cardiac Rhythm: Ventricular paced (11/03 1901) Resp:  [15-18] 18 (11/04 0726) BP: (134-173)/(59-82) 173/82 (11/04 0726) SpO2:  [90 %-98 %] 90 % (11/04 0726) Weight:  [67.7 kg (149 lb 4 oz)] 67.7 kg (149 lb 4 oz) (11/04 0415)  Intake/Output Summary (Last 24 hours) at 01/26/2024 0756 Last data filed at 01/25/2024 1927 Gross per 24 hour  Intake --  Output 550 ml  Net -550 ml   Current Heart Failure Medications:  Loop diuretic: none Beta-Blocker: carvedilol  6.25 mg BID ACEI/ARB/ARNI: none MRA: spironolactone 12.5 mg daily SGLT2i:none  Other: digoxin  0.125 mg daily  Prior to admission Heart Failure Medications:  Loop diuretic: furosemide  10 mg daily Beta-Blocker: carvedilol  3.125 mg BID ACEI/ARB/ARNI:  none MRA: none SGLT2i: none Other: digoxin  0.25 mg daily  Assessment: 1. 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 NYHA class III symptoms. Patient's cardiomyopathy is in part due to pervious LBBB s/p CRT-P, however, she also appears on most recent echo to have restrictive  cardiomyopathy. -Symptoms: Patient reports LEE improved, but still short of breath given PE and effusion. Cardiology to consult IR to evaluate need for draining of pleural effusion.  -Volume: Does not appear overly volume overloaded on exam. Creatinine is up, agree with holding Lasix . -Hemodynamics: BP is severely elevated. HR 60s.  -BB: continue carvedilol  6.25 mg BID -ACEI/ARB/ARNI: Unable to add at this time given eGFR.  -MRA: Currently on spironolactone 12.5 mg daily. eGFR is below and potassium is near the threshold for holding. Will need to monitor closely and may require holding.   -SGLT2i: Reportedly has a history of UTI. May not be ideal at this time.  -Digoxin  level was high on admission. The dose was reduced and now digoxin  level is lower but still high at 1.6. Mortality has been shown to increase with digoxin  levels >1. Digoxin  also has no benefit to potential harm in restrictive cardiomyopathy. Consider stopping digoxin . -Patient noted gout reaction to hydralazine, which is not commonly noted. Can consider retrialing given severe HTN and renal disease.  Plan: 1) Medication changes recommended at this time: -Consider holding digoxin   2) Patient assistance: -Pending  3) Education: - Patient has been educated on current HF medications and potential additions to HF medication regimen - Patient verbalizes understanding that over the next few months, these medication doses may change and more medications may be added to optimize HF regimen - Patient has been educated on basic disease state pathophysiology and goals of therapy  Medication Assistance / Insurance Benefits Check: Does the patient have prescription insurance?    Please do not hesitate to reach out with questions or concerns,  Jaun Bash, PharmD, CPP, BCPS, [facility] Heart Failure Pharmacist  Phone - [phone] 01/26/2024 12:14 PM

## 2024-01-26 NOTE — Progress Notes (Signed)
 [facility] CLINIC CARDIOLOGY PROGRESS NOTE       Patient ID: Deborah Dawson MRN: [id number] DOB/AGE: [birth year] 73 y.o.  Admit date: 01/24/2024 Referring Physician Dr. Cort Mana Primary Physician Sadie Manna, MD  Primary Cardiologist Dr. Ammon Reason for Consultation acute heart failure, PE  HPI: Deborah Dawson is a 73 y.o. female  with a past medical history of chronic HFrEF, s/p CRT-P 07/2021, persistent atrial fibrillation, hypertension, type 2 diabetes who presented to the ED on 01/24/2024 for SOB and LE edema. Cardiology was consulted for further evaluation.   Interval history: -Patient seen and examined this AM, sitting upright on side of bed eating breakfast.  -States breathing feels slightly worse today. Still on RA.  -No chest pain or palpitations. No events noted on tele.  -Renal function slightly worse today.   Review of systems complete and found to be negative unless listed above    Past Medical History:  Diagnosis Date   Allergic genetic state    Atrial fibrillation (HCC) 2010   Cardiomyopathy (HCC)    CHF (congestive heart failure) (HCC)    Diabetes mellitus without complication (HCC)    Dysrhythmia    Gout    H/O cardiac catheterization    Hypertension    Joint pain    Osteopenia    Presence of permanent cardiac pacemaker    Psoriasis     Past Surgical History:  Procedure Laterality Date   ABDOMINAL HYSTERECTOMY  1985   BREAST EXCISIONAL BIOPSY Left    negative years ago   BREAST SURGERY Left 1997   papilloma   CARDIAC CATHETERIZATION Left 08/01/2015   Procedure: Left Heart Cath and Coronary Angiography;  Surgeon: Vinie DELENA Jude, MD;  Location: [facility] INVASIVE CV LAB;  Service: Cardiovascular;  Laterality: Left;   CHOLECYSTECTOMY  1986   COLONOSCOPY WITH PROPOFOL  N/A 08/26/2017   Procedure: COLONOSCOPY WITH PROPOFOL ;  Surgeon: Toledo, Ladell POUR, MD;  Location: [facility] ENDOSCOPY;  Service: Gastroenterology;  Laterality: N/A;   ESOPHAGOGASTRODUODENOSCOPY  (EGD) WITH PROPOFOL  N/A 08/26/2017   Procedure: ESOPHAGOGASTRODUODENOSCOPY (EGD) WITH PROPOFOL ;  Surgeon: Toledo, Ladell POUR, MD;  Location: [facility] ENDOSCOPY;  Service: Gastroenterology;  Laterality: N/A;   ESOPHAGOGASTRODUODENOSCOPY (EGD) WITH PROPOFOL  N/A 07/04/2019   Procedure: ESOPHAGOGASTRODUODENOSCOPY (EGD) WITH PROPOFOL ;  Surgeon: Toledo, Ladell POUR, MD;  Location: [facility] ENDOSCOPY;  Service: Gastroenterology;  Laterality: N/A;   ESOPHAGOGASTRODUODENOSCOPY (EGD) WITH PROPOFOL  N/A 06/12/2023   Procedure: ESOPHAGOGASTRODUODENOSCOPY (EGD) WITH PROPOFOL ;  Surgeon: Maryruth Ole DASEN, MD;  Location: [facility] ENDOSCOPY;  Service: Endoscopy;  Laterality: N/A;  DM   TUBAL LIGATION  1981    Medications Prior to Admission  Medication Sig Dispense Refill Last Dose/Taking   apixaban (ELIQUIS) 5 MG TABS tablet Take 5 mg by mouth 2 (two) times daily.   01/24/2024   carvedilol  (COREG ) 3.125 MG tablet Take 1 tablet (3.125 mg total) by mouth 2 (two) times daily with a meal. 60 tablet 1 01/24/2024   digoxin  (LANOXIN ) 0.25 MG tablet Take 1 tablet (0.25 mg total) by mouth daily. 30 tablet 0 01/24/2024   furosemide  (LASIX ) 20 MG tablet Take 10 mg by mouth daily.   01/24/2024   gabapentin (NEURONTIN) 100 MG capsule Take 100 mg by mouth 2 (two) times daily.   01/24/2024   insulin  glargine (LANTUS  SOLOSTAR) 100 UNIT/ML Solostar Pen Inject 15 Units into the skin at bedtime. (Patient taking differently: Inject 20 Units into the skin at bedtime.) 4.5 mL 11 01/23/2024   Magnesium  Gluconate 550 MG TABS Take by mouth.  Taking   Vitamin D, Ergocalciferol, 2000 units CAPS Take 1 capsule by mouth daily.   Taking   acidophilus (RISAQUAD) CAPS capsule Take 2 capsules by mouth 3 (three) times daily. (Patient not taking: Reported on 03/04/2023) 30 capsule 0 Not Taking   albuterol  (VENTOLIN  HFA) 108 (90 Base) MCG/ACT inhaler Inhale 2 puffs into the lungs every 4 (four) hours as needed for shortness of breath. (Patient not taking: Reported on  03/04/2023)   Not Taking   amLODipine (NORVASC) 2.5 MG tablet Take 2.5 mg by mouth daily. (Patient not taking: Reported on 01/24/2024)   Not Taking   Calcium Carb-Cholecalciferol (CALCIUM 500+D PO) Take 1 tablet by mouth daily. (Patient not taking: Reported on 01/24/2024)   Not Taking   cloNIDine  (CATAPRES ) 0.2 MG tablet Take 0.2 mg by mouth 3 (three) times daily. (Patient not taking: Reported on 03/04/2023)   Not Taking   colchicine 0.6 MG tablet Take 0.6-1.8 mg by mouth as directed. (Patient not taking: Reported on 03/04/2023)   Not Taking   dexamethasone  (DECADRON ) 6 MG tablet Take 1 tablet (6 mg total) by mouth daily. (Patient not taking: Reported on 03/04/2023) 5 tablet 0 Not Taking   diltiazem  (TIAZAC ) 120 MG 24 hr capsule Take 120 mg by mouth daily. (Patient not taking: Reported on 03/04/2023)   Not Taking   fluticasone (FLONASE) 50 MCG/ACT nasal spray Place 1 spray into both nostrils 2 (two) times daily. (Patient not taking: Reported on 01/24/2024)   Not Taking   glimepiride  (AMARYL ) 4 MG tablet Take 4 mg by mouth 2 (two) times daily. (Patient not taking: Reported on 03/04/2023)   Not Taking   guaiFENesin -codeine 100-10 MG/5ML syrup Take 5 mLs by mouth every 4 (four) hours as needed for cough. (Patient not taking: Reported on 03/04/2023)   Not Taking   insulin  lispro (HUMALOG) 100 UNIT/ML injection Inject 12 Units into the skin 3 (three) times daily before meals. (Patient not taking: Reported on 01/24/2024)   Not Taking   Multiple Vitamin (MULTIVITAMIN WITH MINERALS) TABS tablet Take 1 tablet by mouth daily. (Patient not taking: Reported on 01/24/2024) 30 tablet 0 Not Taking   nystatin cream (MYCOSTATIN) Apply 1 Application topically 2 (two) times daily. (Patient not taking: Reported on 01/24/2024)   Not Taking   Semaglutide,0.25 or 0.5MG /DOS, (OZEMPIC, 0.25 OR 0.5 MG/DOSE,) 2 MG/1.5ML SOPN Inject into the skin. (Patient not taking: Reported on 03/04/2023)   Not Taking   spironolactone (ALDACTONE) 25  MG tablet Take 12.5 mg by mouth daily. (Patient not taking: Reported on 01/24/2024)   Not Taking   Turmeric 500 MG CAPS Take 1 capsule by mouth daily. (Patient not taking: Reported on 01/24/2024)   Not Taking   warfarin (COUMADIN ) 2 MG tablet Take 1 tablet (2 mg total) by mouth daily at 4 PM. (Patient not taking: Reported on 03/04/2023) 30 tablet 1 Not Taking   Social History   Socioeconomic History   Marital status: Married    Spouse name: Not on file   Number of children: Not on file   Years of education: Not on file   Highest education level: Not on file  Occupational History   Not on file  Tobacco Use   Smoking status: Never   Smokeless tobacco: Never  Vaping Use   Vaping status: Never Used  Substance and Sexual Activity   Alcohol use: No    Alcohol/week: 0.0 standard drinks of alcohol   Drug use: No   Sexual activity: Not on file  Comment: Married   Other Topics Concern   Not on file  Social History Narrative   Not on file   Social Drivers of Health   Financial Resource Strain: Low Risk  (11/02/2023)   Received from [facility]   Overall Financial Resource Strain (CARDIA)    Difficulty of Paying Living Expenses: Not hard at all  Food Insecurity: No Food Insecurity (01/24/2024)   Hunger Vital Sign    Worried About Running Out of Food in the Last Year: Never true    Ran Out of Food in the Last Year: Never true  Transportation Needs: No Transportation Needs (01/24/2024)   PRAPARE - Administrator, Civil Service (Medical): No    Lack of Transportation (Non-Medical): No  Physical Activity: Not on file  Stress: Not on file  Social Connections: Not on file  Intimate Partner Violence: Not At Risk (01/24/2024)   Humiliation, Afraid, Rape, and Kick questionnaire    Fear of Current or Ex-Partner: No    Emotionally Abused: No    Physically Abused: No    Sexually Abused: No    Family History  Problem Relation Age of Onset   Cancer Mother 93        breast   Diabetes Mother    Breast cancer Mother 98   Coronary artery disease Mother    Stroke Father    Coronary artery disease Father    Diabetes Son      Vitals:   01/25/24 1925 01/26/24 0043 01/26/24 0415 01/26/24 0726  BP: (!) 137/59 134/62 (!) 150/69 (!) 173/82  Pulse: 62 66 65 63  Resp: 18 18 18 18   Temp: 98.4 F (36.9 C) 98.4 F (36.9 C) 98.6 F (37 C) 98.3 F (36.8 C)  TempSrc: Oral Oral  Oral  SpO2: 97% 98% 90% 90%  Weight:   67.7 kg   Height:        PHYSICAL EXAM General: Well-appearing elderly female, well nourished, in no acute distress. HEENT: Normocephalic and atraumatic. Neck: No JVD.  Lungs: Normal respiratory effort on room air.  Bibasilar crackles.  Heart: HRRR. Normal S1 and S2 without gallops or murmurs.  Abdomen: Non-distended appearing.  Msk: Normal strength and tone for age. Extremities: Warm and well perfused. No clubbing, cyanosis.  Trace edema.  Neuro: Alert and oriented X 3. Psych: Answers questions appropriately.   Labs: Basic Metabolic Panel: Recent Labs    01/25/24 0327 01/26/24 0343  NA 138 135  K 4.0 4.4  CL 104 100  CO2 24 24  GLUCOSE 80 161*  BUN 35* 40*  CREATININE 1.39* 1.93*  CALCIUM 8.5* 8.5*   Liver Function Tests: Recent Labs    01/24/24 0800  AST 18  ALT 9  ALKPHOS 61  BILITOT 1.4*  PROT 8.0  ALBUMIN 3.4*   No results for input(s): LIPASE, AMYLASE in the last 72 hours. CBC: Recent Labs    01/24/24 0800 01/25/24 0327  WBC 8.4 6.9  HGB 14.2 12.7  HCT 45.5 38.4  MCV 90.1 86.7  PLT 239 212   Cardiac Enzymes: Recent Labs    01/24/24 0800 01/24/24 1005  TROPONINIHS 31* 28*   BNP: Recent Labs    01/24/24 0800  BNP 2,499.0*   D-Dimer: Recent Labs    01/24/24 0800  DDIMER 2.82*   Hemoglobin A1C: Recent Labs    01/24/24 1005  HGBA1C 8.0*   Fasting Lipid Panel: No results for input(s): CHOL, HDL, LDLCALC, TRIG, CHOLHDL, LDLDIRECT in  the last 72 hours. Thyroid  Function Tests: No results for input(s): TSH, T4TOTAL, T3FREE, THYROIDAB in the last 72 hours.  Invalid input(s): FREET3 Anemia Panel: No results for input(s): VITAMINB12, FOLATE, FERRITIN, TIBC, IRON, RETICCTPCT in the last 72 hours.   Radiology: US  Venous Img Lower Bilateral (DVT) Result Date: 01/24/2024 CLINICAL DATA:  Pulmonary embolism. EXAM: BILATERAL LOWER EXTREMITY VENOUS DOPPLER ULTRASOUND TECHNIQUE: Gray-scale sonography with compression, as well as color and duplex ultrasound, were performed to evaluate the deep venous system(s) from the level of the common femoral vein through the popliteal and proximal calf veins. COMPARISON:  None Available. FINDINGS: VENOUS Normal compressibility of the common femoral, superficial femoral, and popliteal veins, as well as the visualized calf veins. Visualized portions of profunda femoral vein and great saphenous vein unremarkable. No filling defects to suggest DVT on grayscale or color Doppler imaging. Doppler waveforms show normal direction of venous flow, normal respiratory plasticity and response to augmentation. Limited views of the contralateral common femoral vein are unremarkable. OTHER None. Limitations: none IMPRESSION: Negative. Electronically Signed   By: Leita Birmingham M.D.   On: 01/24/2024 14:19   CT Angio Chest PE W and/or Wo Contrast Addendum Date: 01/24/2024 ** ADDENDUM #1 ** ADDENDUM: The above findings were discussed with Dr. Levander at 9:53 am 01/24/24. ---------------------------------------------------- Electronically signed by: Evalene Coho MD 01/24/2024 10:49 AM EST RP Workstation: [id number]   Result Date: 01/24/2024 ** ORIGINAL REPORT ** EXAM: CTA of the Chest with contrast for PE 01/24/2024 09:25:18 AM TECHNIQUE: CTA of the chest was performed without and with the administration of 75 mL of iohexol  (OMNIPAQUE ) 350 MG/ML injection. Multiplanar reformatted images are provided for review. MIP images are provided  for review. Automated exposure control, iterative reconstruction, and/or weight based adjustment of the mA/kV was utilized to reduce the radiation dose to as low as reasonably achievable. COMPARISON: PA and lateral radiographs of the chest dated 01/24/2024. CLINICAL HISTORY: Pulmonary embolism (PE) suspected, low to intermediate prob, positive D-dimer. FINDINGS: PULMONARY ARTERIES: Pulmonary arteries are adequately opacified for evaluation. There is nonocclusive thromboembolic disease present within the distal right pulmonary artery and within the right lower lobe artery and anterior segmental branch of the right lower lobe pulmonary artery. There is also thromboembolus within the posterior segmental artery. There is no definite thromboembolic disease demonstrated in the left lung. Main pulmonary artery is normal in caliber. MEDIASTINUM: The heart is enlarged. There is no evidence of right ventricular strain. There is mild calcific coronary artery disease. The thoracic aorta demonstrates mild-to-moderate calcific atheromatous disease. The ascending thoracic aorta measures approximately 3.3 cm in diameter. LYMPH NODES: There are numerous enlarged mediastinal lymph nodes. There is also right hilar lymphadenopathy. No axillary lymphadenopathy. LUNGS AND PLEURA: There is mild dependent atelectasis within the right lung. There is a moderate right-sided pleural effusion. No pneumothorax. No focal consolidation or pulmonary edema. UPPER ABDOMEN: Limited images of the upper abdomen are unremarkable. SOFT TISSUES AND BONES: No acute bone or soft tissue abnormality. IMPRESSION: 1. Nonocclusive pulmonary emboli involving the distal right pulmonary artery, right lower lobe artery, and anterior and posterior segmental branches; no left-sided PE identified. No CT evidence of right heart strain. 2. Moderate right pleural effusion. 3. Cardiomegaly with mild coronary artery calcifications and mild-to-moderate thoracic aortic  atherosclerosis. 4. Enlarged mediastinal and right hilar lymph nodes; recommend clinical correlation and follow-up per oncologic/infectious/inflammatory considerations. Electronically signed by: Evalene Coho MD 01/24/2024 09:51 AM EST RP Workstation: [id number]   DG Chest 2 View Result Date: 01/24/2024 CLINICAL  DATA:  Shortness of breath. EXAM: CHEST - 2 VIEW COMPARISON:  08/08/2020 FINDINGS: Mild cardiomegaly, with dual lead pacemaker in place. Small to moderate right pleural effusion. Right basilar atelectasis versus infiltrate. IMPRESSION: Small to moderate right pleural effusion, with right basilar atelectasis versus infiltrate. Electronically Signed   By: Norleen DELENA Kil M.D.   On: 01/24/2024 08:29    ECHO 12/2023: SEVERE LEFT VENTRICULAR SYSTOLIC DYSFUNCTION WITH MILD LVH  ESTIMATED EF: 25%, CALC EF(2D): 25%  ELEVATED LA PRESSURES WITH DIASTOLIC DYSFUNCTION (GRADE 3)  NORMAL RIGHT VENTRICULAR SYSTOLIC FUNCTION  VALVULAR REGURGITATION: TRIVIAL AR, MILD MR, No PR, MODERATE TR                           ESTIMATED RVSP: 56 mmHg (ABNORMAL)  NO VALVULAR STENOSIS         TELEMETRY (personally reviewed): Ventricular pacing rate 70s  EKG (personally reviewed): VP rate 61 bpm  Data reviewed by me 01/26/2024: last 24h vitals tele labs imaging I/O ED provider note, admission H&P  Principal Problem:   CHF (congestive heart failure) (HCC) Active Problems:   Acute respiratory failure with hypoxia (HCC)   Acute pulmonary embolism (HCC)   Pleural effusion on right    ASSESSMENT AND PLAN:  Deborah Dawson is a 73 y.o. female  with a past medical history of chronic HFrEF, persistent atrial fibrillation, hypertension, type 2 diabetes who presented to the ED on 01/24/2024 for SOB and LE edema. Cardiology was consulted for further evaluation.   # Acute on chronic HFrEF # Pulmonary emboli # Persistent atrial fibrillation # S/p CRT-P # Hypertension Patient presented with worsening shortness of breath,  lower extremity edema, cough.  Chest x-ray concerning for heart failure with elevated BNP at 2500.  CTA chest did reveal multiple right sided pulmonary emboli and she was started on IV heparin . - Continue IV heparin .  Xarelto copay is $0 - plan to start prior to DC.  - Hold IV lasix  given elevation in Cr. Primary team reaching out to IR for thoracentesis.  - Continue carvedilol  6.25 mg twice daily for improved BP control, GDMT.  Continue spironolactone 12.5 mg daily.  Previously has not tolerated Entresto due to cough, Farxiga due to UTI and yeast infections, ARBs. -Minimal and flat troponin most consistent with demand/supply mismatch and not ACS in the setting of above.   This patient's plan of care was discussed and created with Dr. Florencio and he is in agreement.  Signed: Danita Bloch, PA-C  01/26/2024, 8:52 AM [facility]

## 2024-01-26 NOTE — Evaluation (Signed)
 Physical Therapy Evaluation Patient Details Name: Deborah Dawson MRN: [id number] DOB: [birth year] Today's Date: 01/26/2024  History of Present Illness  Deborah Dawson is a 73 y.o. female with medical history significant of PAF on Eliquis, chronic HFrEF LVEF 25% with recurrent right-sided pleural effusion, PPM-ICD, HTN, IDDM, gout, presented with worsening of shortness of breath and leg swelling.     Patient was just seen 5 days ago by her cardiology for routine visit, she was doing fine at that time.  Symptoms started 3 days ago patient started to feel increasing exertional dyspnea, and also developed orthopnea, in addition she also noticed increasing leg swelling left more than right.  However she has not tried to increase her p.o. Lasix  dosage to compensate her symptoms.  She denied any chest pain no cough.  Last night, she could not lie flat on the bed and decided to come to ED.  Clinical Impression  Pt is a pleasant 73 y.o. female admitted for CHF and PE. Pt is at her baseline level of functioning and is able to ambulate 270ft with supervision without the use of AD. Pt did not experience LOB or DOE while ambulating, O2 monitored throughout. Pt does not need skilled therapy services and is cleared by PT to return home.      If plan is discharge home, recommend the following: A little help with walking and/or transfers   Can travel by private vehicle        Equipment Recommendations None recommended by PT  Recommendations for Other Services       Functional Status Assessment Patient has not had a recent decline in their functional status     Precautions / Restrictions Precautions Precautions: None Restrictions Weight Bearing Restrictions Per Provider Order: No      Mobility  Bed Mobility Overal bed mobility: Independent             General bed mobility comments: able to mobilize from supine to EOB without assistance    Transfers Overall transfer level: Independent Equipment  used: None               General transfer comment: able to transfer from EOB to standing, no AD without assistance    Ambulation/Gait Ambulation/Gait assistance: Independent Gait Distance (Feet): 200 Feet Assistive device: None Gait Pattern/deviations: Decreased stride length       General Gait Details: Able to ambulate 272ft without AD, no LOB or DOE; O2 at 94 during ambulation  Stairs            Wheelchair Mobility     Tilt Bed    Modified Rankin (Stroke Patients Only)       Balance Overall balance assessment: Independent                                           Pertinent Vitals/Pain Pain Assessment Pain Assessment: No/denies pain    Home Living Family/patient expects to be discharged to:: Private residence Living Arrangements: Spouse/significant other;Other relatives Available Help at Discharge: Family Type of Home: House Home Access: Stairs to enter Entrance Stairs-Rails: Can reach both Entrance Stairs-Number of Steps: 5   Home Layout: One level Home Equipment: None Additional Comments: Handicapped shower; has basement but does not use it    Prior Function Prior Level of Function : Independent/Modified Independent  Mobility Comments: able to ambulate around home independently ADLs Comments: able to carry out ADLs independently     Extremity/Trunk Assessment   Upper Extremity Assessment Upper Extremity Assessment: Overall WFL for tasks assessed    Lower Extremity Assessment Lower Extremity Assessment: Overall WFL for tasks assessed    Cervical / Trunk Assessment Cervical / Trunk Assessment: Kyphotic  Communication   Communication Communication: No apparent difficulties    Cognition Arousal: Alert Behavior During Therapy: WFL for tasks assessed/performed   PT - Cognitive impairments: No apparent impairments                         Following commands: Intact       Cueing Cueing  Techniques: Verbal cues     General Comments General comments (skin integrity, edema, etc.): NT    Exercises Other Exercises Other Exercises: gait for 22ft, no AD   Assessment/Plan    PT Assessment Patient does not need any further PT services  PT Problem List         PT Treatment Interventions      PT Goals (Current goals can be found in the Care Plan section)  Acute Rehab PT Goals Patient Stated Goal: to return home PT Goal Formulation: With patient Time For Goal Achievement: 01/26/24 Potential to Achieve Goals: Good    Frequency       Co-evaluation               AM-PAC PT 6 Clicks Mobility  Outcome Measure Help needed turning from your back to your side while in a flat bed without using bedrails?: None Help needed moving from lying on your back to sitting on the side of a flat bed without using bedrails?: None Help needed moving to and from a bed to a chair (including a wheelchair)?: None Help needed standing up from a chair using your arms (e.g., wheelchair or bedside chair)?: None Help needed to walk in hospital room?: None Help needed climbing 3-5 steps with a railing? : None 6 Click Score: 24    End of Session Equipment Utilized During Treatment: Gait belt Activity Tolerance: Patient tolerated treatment well Patient left: in bed;with call bell/phone within reach Nurse Communication: Mobility status PT Visit Diagnosis: Difficulty in walking, not elsewhere classified (R26.2)    Time: 9074-9062 PT Time Calculation (min) (ACUTE ONLY): 12 min   Charges:   PT Evaluation $PT Eval Low Complexity: 1 Low   PT General Charges $$ ACUTE PT VISIT: 1 Visit         Allena Bulls, SPT   Allena Bulls 01/26/2024, 11:54 AM

## 2024-01-26 NOTE — Procedures (Signed)
 PROCEDURE SUMMARY:  Successful image-guided diagnostic and therapeutic thoracentesis from the right chest.  Yielded 900 mL of clear yellow fluid.  No immediate complications.  EBL: zero Patient tolerated well.   Specimen sent for labs.  Post-procedure CXR ordered.  Please see imaging section of Epic for full dictation.  Estrellita Lasky B Shandel Busic NP 01/26/2024 12:55 PM

## 2024-01-26 NOTE — Progress Notes (Signed)
 [facility]  - [place] at [facility]   PATIENT NAME: Deborah Dawson    MR#:  [id number]  DATE OF BIRTH:  06/23/1950  SUBJECTIVE:  Came in with increasing shortness of breath for last three days. Found to have PE. Denies any recent travel or illness. Denies any pain in calf muscle Feels weak    VITALS:  Blood pressure (!) 158/75, pulse 60, temperature 98.3 F (36.8 C), temperature source Oral, resp. rate 18, height 5' 1 (1.549 m), weight 67.7 kg, SpO2 98%.  PHYSICAL EXAMINATION:   GENERAL:  73 y.o.-year-old patient with no acute distress.  LUNGS: decreased breath sounds bilaterally, no wheezing CARDIOVASCULAR: S1, S2 normal. No murmur   ABDOMEN: Soft, nontender, nondistended. Bowel sounds present.  EXTREMITIES: No  edema b/l.    NEUROLOGIC: nonfocal  patient is alert and awake SKIN: No obvious rash, lesion, or ulcer.   LABORATORY PANEL:  CBC Recent Labs  Lab 01/25/24 0327  WBC 6.9  HGB 12.7  HCT 38.4  PLT 212    Chemistries  Recent Labs  Lab 01/24/24 0800 01/25/24 0327 01/26/24 0343  NA 137   < > 135  K 4.4   < > 4.4  CL 102   < > 100  CO2 24   < > 24  GLUCOSE 152*   < > 161*  BUN 29*   < > 40*  CREATININE 1.24*   < > 1.93*  CALCIUM 8.8*   < > 8.5*  AST 18  --   --   ALT 9  --   --   ALKPHOS 61  --   --   BILITOT 1.4*  --   --    < > = values in this interval not displayed.   Cardiac Enzymes No results for input(s): TROPONINI in the last 168 hours. RADIOLOGY:    Assessment and Plan   Deborah Dawson is a 73 y.o. female with medical history significant of PAF on Eliquis, chronic HFrEF LVEF 25% with recurrent right-sided pleural effusion, PPM-ICD, HTN, IDDM, gout, presented with worsening of shortness of breath and leg swelling.  Symptoms started 3 days ago patient started to feel increasing exertional dyspnea, and also developed orthopnea, in addition she also noticed increasing leg swelling left more than right.   CTA showed nonocclusive  pulmonary emboli improved distal right pulmonary artery right lower lobe artery and anterior and posterior segment branch no left-sided PE and no signs of right heart strain.   Acute on chronic HFrEF decompensation Right Pleural effusion - Significant fluid overload with increasing right-sided pleural effusion bilateral worsening edema - Continue Lasix  40 mg twice daily--hold ing now - Echocardiogram was done last week showing LVEF 25%, will not repeat echo stat. --[facility] cardiology input noted--increased BB --holding lasix  due to rising creat --Right sided US  guided thoracentesis--900 cc yellow fluid   RLL PE - Not entirely clear whether this should be considered Eliquis treatment failure.  As PE cardiology only exist in the right lower lobe, per patient had a moderate to large amount of pleural effusion and likely atelectasis.   --cont heparin  drip for now--change to xarelto from am --LE US  negative for DVT   HTN, uncontrolled - Will try to adjust her BP meds, unfortunately patient is already bradycardic, there is no room to increase her Coreg  dosage. - She is allergic to hydralazine.  For now we will start her on Imdur.   IDDM -SSI   PAF Pt was on eliquis -  Borderline elevation of digoxin  level, we will hold off digoxin  today - Heparin  drip for PE   Procedures: Family communication :none today Consults :[facility] cardiology CODE STATUS: full DVT Prophylaxis :hepsarin Level of care: Telemetry Status is: Inpatient Remains inpatient appropriate because: on heparin  gtt, monitoing creat    TOTAL TIME TAKING CARE OF THIS PATIENT: 40 minutes.  >50% time spent on counselling and coordination of care  Note: This dictation was prepared with Dragon dictation along with smaller phrase technology. Any transcriptional errors that result from this process are unintentional.  Leita Blanch M.D    [facility]   CC: Primary care physician; Sadie Manna, MD

## 2024-01-26 NOTE — Plan of Care (Signed)

## 2024-01-26 NOTE — Progress Notes (Addendum)
 PHARMACY - ANTICOAGULATION CONSULT NOTE  Pharmacy Consult for Heparin  Indication: pulmonary embolus  Allergies  Allergen Reactions   Doxazosin Other (See Comments)    Cardura - cough   Doxycycline      Abdominal pain   Drug Class [Clindamycin/Lincomycin] Hives   Hydralazine Hcl Other (See Comments)    gout   Metformin  And Related Swelling   Ciprofloxacin Other (See Comments)    Numbness in face   Iodine Other (See Comments)   Pioglitazone Other (See Comments)   Sacubitril-Valsartan Cough    Pt states that it gave her a cough and chest palpitations   Semaglutide Nausea Only   Sulfamethoxazole-Trimethoprim Hives   Augmentin [Amoxicillin-Pot Clavulanate] Diarrhea   Dulaglutide Nausea Only   Hctz [Hydrochlorothiazide] Other (See Comments)    Gout   Losartan Diarrhea   Poison Oak Extract Rash   Red Dye #40 (Allura Red) Rash    Patient Measurements: Height: 5' 1 (154.9 cm) Weight: 67.7 kg (149 lb 4 oz) IBW/kg (Calculated) : 47.8 HEPARIN  DW (KG): 62.6  Vital Signs: Temp: 98.6 F (37 C) (11/04 0415) Temp Source: Oral (11/04 0043) BP: 150/69 (11/04 0415) Pulse Rate: 65 (11/04 0415)  Labs: Recent Labs    01/24/24 0800 01/24/24 1005 01/24/24 2040 01/25/24 0327 01/25/24 0621 01/25/24 1605 01/26/24 0343  HGB 14.2  --   --  12.7  --   --   --   HCT 45.5  --   --  38.4  --   --   --   PLT 239  --   --  212  --   --   --   APTT 48*  --    < >  --  87* 86* 77*  LABPROT 21.1*  --   --   --   --   --   --   INR 1.7*  --   --   --   --   --   --   HEPARINUNFRC >1.10*  --   --  >1.10*  --   --  >1.10*  CREATININE 1.24*  --   --  1.39*  --   --   --   TROPONINIHS 31* 28*  --   --   --   --   --    < > = values in this interval not displayed.    Estimated Creatinine Clearance: 30.8 mL/min (A) (by C-G formula based on SCr of 1.39 mg/dL (H)).   Medical History: Past Medical History:  Diagnosis Date   Allergic genetic state    Atrial fibrillation (HCC) 2010    Cardiomyopathy (HCC)    CHF (congestive heart failure) (HCC)    Diabetes mellitus without complication (HCC)    Dysrhythmia    Gout    H/O cardiac catheterization    Hypertension    Joint pain    Osteopenia    Presence of permanent cardiac pacemaker    Psoriasis     Medications:  Medications Prior to Admission  Medication Sig Dispense Refill Last Dose/Taking   apixaban (ELIQUIS) 5 MG TABS tablet Take 5 mg by mouth 2 (two) times daily.   01/24/2024   carvedilol  (COREG ) 3.125 MG tablet Take 1 tablet (3.125 mg total) by mouth 2 (two) times daily with a meal. 60 tablet 1 01/24/2024   digoxin  (LANOXIN ) 0.25 MG tablet Take 1 tablet (0.25 mg total) by mouth daily. 30 tablet 0 01/24/2024   furosemide  (LASIX ) 20 MG tablet Take 10 mg by mouth  daily.   01/24/2024   gabapentin (NEURONTIN) 100 MG capsule Take 100 mg by mouth 2 (two) times daily.   01/24/2024   insulin  glargine (LANTUS  SOLOSTAR) 100 UNIT/ML Solostar Pen Inject 15 Units into the skin at bedtime. (Patient taking differently: Inject 20 Units into the skin at bedtime.) 4.5 mL 11 01/23/2024   Magnesium  Gluconate 550 MG TABS Take by mouth.   Taking   Vitamin D, Ergocalciferol, 2000 units CAPS Take 1 capsule by mouth daily.   Taking   acidophilus (RISAQUAD) CAPS capsule Take 2 capsules by mouth 3 (three) times daily. (Patient not taking: Reported on 03/04/2023) 30 capsule 0 Not Taking   albuterol  (VENTOLIN  HFA) 108 (90 Base) MCG/ACT inhaler Inhale 2 puffs into the lungs every 4 (four) hours as needed for shortness of breath. (Patient not taking: Reported on 03/04/2023)   Not Taking   amLODipine (NORVASC) 2.5 MG tablet Take 2.5 mg by mouth daily. (Patient not taking: Reported on 01/24/2024)   Not Taking   Calcium Carb-Cholecalciferol (CALCIUM 500+D PO) Take 1 tablet by mouth daily. (Patient not taking: Reported on 01/24/2024)   Not Taking   cloNIDine  (CATAPRES ) 0.2 MG tablet Take 0.2 mg by mouth 3 (three) times daily. (Patient not taking: Reported on  03/04/2023)   Not Taking   colchicine 0.6 MG tablet Take 0.6-1.8 mg by mouth as directed. (Patient not taking: Reported on 03/04/2023)   Not Taking   dexamethasone  (DECADRON ) 6 MG tablet Take 1 tablet (6 mg total) by mouth daily. (Patient not taking: Reported on 03/04/2023) 5 tablet 0 Not Taking   diltiazem  (TIAZAC ) 120 MG 24 hr capsule Take 120 mg by mouth daily. (Patient not taking: Reported on 03/04/2023)   Not Taking   fluticasone (FLONASE) 50 MCG/ACT nasal spray Place 1 spray into both nostrils 2 (two) times daily. (Patient not taking: Reported on 01/24/2024)   Not Taking   glimepiride  (AMARYL ) 4 MG tablet Take 4 mg by mouth 2 (two) times daily. (Patient not taking: Reported on 03/04/2023)   Not Taking   guaiFENesin -codeine 100-10 MG/5ML syrup Take 5 mLs by mouth every 4 (four) hours as needed for cough. (Patient not taking: Reported on 03/04/2023)   Not Taking   insulin  lispro (HUMALOG) 100 UNIT/ML injection Inject 12 Units into the skin 3 (three) times daily before meals. (Patient not taking: Reported on 01/24/2024)   Not Taking   Multiple Vitamin (MULTIVITAMIN WITH MINERALS) TABS tablet Take 1 tablet by mouth daily. (Patient not taking: Reported on 01/24/2024) 30 tablet 0 Not Taking   nystatin cream (MYCOSTATIN) Apply 1 Application topically 2 (two) times daily. (Patient not taking: Reported on 01/24/2024)   Not Taking   Semaglutide,0.25 or 0.5MG /DOS, (OZEMPIC, 0.25 OR 0.5 MG/DOSE,) 2 MG/1.5ML SOPN Inject into the skin. (Patient not taking: Reported on 03/04/2023)   Not Taking   spironolactone (ALDACTONE) 25 MG tablet Take 12.5 mg by mouth daily. (Patient not taking: Reported on 01/24/2024)   Not Taking   Turmeric 500 MG CAPS Take 1 capsule by mouth daily. (Patient not taking: Reported on 01/24/2024)   Not Taking   warfarin (COUMADIN ) 2 MG tablet Take 1 tablet (2 mg total) by mouth daily at 4 PM. (Patient not taking: Reported on 03/04/2023) 30 tablet 1 Not Taking   Scheduled:   carvedilol   6.25 mg  Oral BID WC   digoxin   0.125 mg Oral Daily   furosemide   40 mg Intravenous BID   gabapentin  100 mg Oral BID   insulin   aspart  0-9 Units Subcutaneous TID WC   isosorbide mononitrate  30 mg Oral Daily   Rivaroxaban  15 mg Oral BID WC   Followed by   NOREEN ON 02/16/2024] rivaroxaban  20 mg Oral Q supper   sodium chloride  flush  3 mL Intravenous Q12H   spironolactone  12.5 mg Oral Daily   Infusions:   heparin  900 Units/hr (01/25/24 1049)   PRN:  Anti-infectives (From admission, onward)    None       Assessment: Patient is a 73 yo F presenting with SOB and an ongoing cough that has been present for several months. PMH significant for nonischemic cardiomyopathy, A-fib (on Eliquis), hypertension, T2DM, bradycardia resulted with s/p pacemaker. Pharmacy is consulted for heparin  dosing and monitoring. Last dose of Eliquis is unknown.  11/2: CT chest remarkable for nonocclusive pulmonary emboli involving the distal right pulmonary artery, right lower lobe artery, and anterior and posterior segmental branches.   Baseline CBC is stable. Baseline aPTT, INR, and HL ordered as add on labs.    Goal of Therapy:  Heparin  level 0.3-0.7 units/ml aPTT 66-102 seconds Monitor platelets by anticoagulation protocol: Yes   Date Time aPTT Rate/Comment  11/2 2040 108 Supratherapeutic @1000  units/hr 11/3 9378 87 Therapeutic x1 @900  units/hr 11/3 1605 86 Therapeutic x2 @900  units/hour 11/4     0343    77        Therapeutic x3 @ 900 units/hr   Plan:  11/4 @ 0343:  aPTT = 77,  HL = > 1.10 - aPTT therapeutic X 3 ,  HL elevated from PTA Eliquis - will continue pt on current rate - recheck aPTT and HL with morning labs - transition to HL dosing only once both levels correlate - CBC daily  Thank you for involving pharmacy in this patient's care.   Dhrithi Riche A Kilee Hedding, PharmD Clinical Pharmacist 01/26/2024 8:05 AM

## 2024-01-26 NOTE — Care Management Important Message (Signed)
 Important Message  Patient Details  Name: Deborah Dawson MRN: [id number] Date of Birth: [birth year]   Important Message Given:  Yes - Medicare IM     Deborah Dawson 01/26/2024, 12:09 PM

## 2024-01-27 DIAGNOSIS — N189 Chronic kidney disease, unspecified: Secondary | ICD-10-CM

## 2024-01-27 DIAGNOSIS — Z794 Long term (current) use of insulin: Secondary | ICD-10-CM

## 2024-01-27 DIAGNOSIS — N179 Acute kidney failure, unspecified: Secondary | ICD-10-CM

## 2024-01-27 DIAGNOSIS — I2699 Other pulmonary embolism without acute cor pulmonale: Secondary | ICD-10-CM | POA: Diagnosis not present

## 2024-01-27 DIAGNOSIS — I5023 Acute on chronic systolic (congestive) heart failure: Secondary | ICD-10-CM | POA: Diagnosis not present

## 2024-01-27 DIAGNOSIS — J90 Pleural effusion, not elsewhere classified: Secondary | ICD-10-CM | POA: Diagnosis not present

## 2024-01-27 DIAGNOSIS — I480 Paroxysmal atrial fibrillation: Secondary | ICD-10-CM

## 2024-01-27 DIAGNOSIS — E1165 Type 2 diabetes mellitus with hyperglycemia: Secondary | ICD-10-CM

## 2024-01-27 LAB — BASIC METABOLIC PANEL WITH GFR
Anion gap: 13 (ref 5–15)
BUN: 41 mg/dL — ABNORMAL HIGH (ref 8–23)
CO2: 23 mmol/L (ref 22–32)
Calcium: 8.7 mg/dL — ABNORMAL LOW (ref 8.9–10.3)
Chloride: 99 mmol/L (ref 98–111)
Creatinine, Ser: 2.14 mg/dL — ABNORMAL HIGH (ref 0.44–1.00)
GFR, Estimated: 24 mL/min — ABNORMAL LOW (ref >60)
Glucose, Bld: 141 mg/dL — ABNORMAL HIGH (ref 70–99)
Potassium: 4.3 mmol/L (ref 3.5–5.1)
Sodium: 135 mmol/L (ref 135–145)

## 2024-01-27 LAB — CBC
HCT: 41.9 % (ref 36.0–46.0)
Hemoglobin: 13.5 g/dL (ref 12.0–15.0)
MCH: 28.2 pg (ref 26.0–34.0)
MCHC: 32.2 g/dL (ref 30.0–36.0)
MCV: 87.7 fL (ref 80.0–100.0)
Platelets: 227 K/uL (ref 150–400)
RBC: 4.78 MIL/uL (ref 3.87–5.11)
RDW: 15.9 % — ABNORMAL HIGH (ref 11.5–15.5)
WBC: 6.5 K/uL (ref 4.0–10.5)
nRBC: 0 % (ref 0.0–0.2)

## 2024-01-27 LAB — GLUCOSE, CAPILLARY
Glucose-Capillary: 150 mg/dL — ABNORMAL HIGH (ref 70–99)
Glucose-Capillary: 264 mg/dL — ABNORMAL HIGH (ref 70–99)
Glucose-Capillary: 181 mg/dL — ABNORMAL HIGH (ref 70–99)
Glucose-Capillary: 173 mg/dL — ABNORMAL HIGH (ref 70–99)

## 2024-01-27 LAB — HEPARIN LEVEL (UNFRACTIONATED): Heparin Unfractionated: 0.62 [IU]/mL (ref 0.30–0.70)

## 2024-01-27 LAB — DIGOXIN LEVEL: Digoxin Level: 1.6 ng/mL (ref 0.8–2.0)

## 2024-01-27 LAB — APTT: aPTT: 69 s — ABNORMAL HIGH (ref 24–36)

## 2024-01-27 MED ORDER — AMLODIPINE BESYLATE 5 MG PO TABS: 5.0000 mg | ORAL_TABLET | Freq: Every day | ORAL | Status: DC

## 2024-01-27 MED ORDER — SPIRONOLACTONE 12.5 MG HALF TABLET
12.5000 mg | ORAL_TABLET | Freq: Every day | ORAL | Status: DC
  Filled 2024-01-27: qty 1

## 2024-01-27 MED ORDER — SODIUM CHLORIDE 0.9 % IV BOLUS
250.0000 mL | Freq: Once | INTRAVENOUS | Status: AC
  Administered 2024-01-27: 250 mL via INTRAVENOUS

## 2024-01-27 MED ORDER — INSULIN GLARGINE-YFGN 100 UNIT/ML ~~LOC~~ SOLN
15.0000 [IU] | Freq: Every day | SUBCUTANEOUS | Status: DC
  Administered 2024-01-27 – 2024-01-29 (×3): 15 [IU] via SUBCUTANEOUS
  Filled 2024-01-27 (×4): qty 0.15

## 2024-01-27 NOTE — Assessment & Plan Note (Signed)
 Patient sliding-scale insulin .  We will restart Lantus .

## 2024-01-27 NOTE — Progress Notes (Signed)
 Discussed patient's HTN with MD. Patient has multiple allergies and refuses new order for norvasc. Patient had no signs of distress at this time.

## 2024-01-27 NOTE — Progress Notes (Signed)
 Mobility Specialist - Progress Note   01/27/24 0900  Mobility  Activity Ambulated with assistance;Stood at bedside;Dangled on edge of bed;Respositioned in chair  Level of Assistance Independent after set-up  Assistive Device  (IV Stand)  Distance Ambulated (ft) 205 ft  Range of Motion/Exercises All extremities  Activity Response Tolerated well  Mobility visit 1 Mobility  Mobility Specialist Start Time (ACUTE ONLY) [id number]  Mobility Specialist Stop Time (ACUTE ONLY) 0943  Mobility Specialist Time Calculation (min) (ACUTE ONLY) 15 min   Pt was resting in bed in chair position on RA. Pt agreed to mobility. Pt is receiving Fluid with IV. Pt is able today to get to the EOB independently with bed features. Pt is able today to STS independently. Pt ambulated well. Pt did not need a recovery break throughout activity. After activity pt repositioned in recliner with needs in reach.  Clem Rodes Mobility Specialist 01/27/24, 9:55 AM

## 2024-01-27 NOTE — Plan of Care (Signed)

## 2024-01-27 NOTE — Assessment & Plan Note (Signed)
 Ruled out

## 2024-01-27 NOTE — Assessment & Plan Note (Addendum)
 900 mL of fluid drawn underneath the right lung on 11/4.  Repeat chest x-ray on 11/7 shows reaccumulation of fluid.  11/17 thoracentesis drew off 700 mL underneath right lung.  Repeat chest x-ray shows near complete resolution of right pleural effusion with minimal residual blunting of the right costophrenic angle.  Cytology and flow cytometry sent off.  Will refer to pulmonary as outpatient will need to be followed for reaccumulation of fluid.

## 2024-01-27 NOTE — Progress Notes (Signed)
 [facility] CLINIC CARDIOLOGY PROGRESS NOTE       Patient ID: Deborah Dawson MRN: [id number] DOB/AGE: [birth year] 75 y.o.  Admit date: 01/24/2024 Referring Physician Dr. Cort Mana Primary Physician Sadie Manna, MD  Primary Cardiologist Dr. Ammon Reason for Consultation acute heart failure, PE  HPI: Deborah Dawson is a 73 y.o. female  with a past medical history of chronic HFrEF, s/p CRT-P 07/2021, persistent atrial fibrillation, hypertension, type 2 diabetes who presented to the ED on 01/24/2024 for SOB and LE edema. Cardiology was consulted for further evaluation.   Interval history: -Patient seen and examined this AM, sitting upright on side of bed eating breakfast.  -States breathing feels slightly worse today. Still on RA.  -No chest pain or palpitations. No events noted on tele.  -Renal function slightly worse today.   Review of systems complete and found to be negative unless listed above    Past Medical History:  Diagnosis Date   Allergic genetic state    Atrial fibrillation (HCC) 2010   Cardiomyopathy (HCC)    CHF (congestive heart failure) (HCC)    Diabetes mellitus without complication (HCC)    Dysrhythmia    Gout    H/O cardiac catheterization    Hypertension    Joint pain    Osteopenia    Presence of permanent cardiac pacemaker    Psoriasis     Past Surgical History:  Procedure Laterality Date   ABDOMINAL HYSTERECTOMY  1985   BREAST EXCISIONAL BIOPSY Left    negative years ago   BREAST SURGERY Left 1997   papilloma   CARDIAC CATHETERIZATION Left 08/01/2015   Procedure: Left Heart Cath and Coronary Angiography;  Surgeon: Vinie DELENA Jude, MD;  Location: [facility] INVASIVE CV LAB;  Service: Cardiovascular;  Laterality: Left;   CHOLECYSTECTOMY  1986   COLONOSCOPY WITH PROPOFOL  N/A 08/26/2017   Procedure: COLONOSCOPY WITH PROPOFOL ;  Surgeon: Toledo, Ladell POUR, MD;  Location: [facility] ENDOSCOPY;  Service: Gastroenterology;  Laterality: N/A;   ESOPHAGOGASTRODUODENOSCOPY  (EGD) WITH PROPOFOL  N/A 08/26/2017   Procedure: ESOPHAGOGASTRODUODENOSCOPY (EGD) WITH PROPOFOL ;  Surgeon: Toledo, Ladell POUR, MD;  Location: [facility] ENDOSCOPY;  Service: Gastroenterology;  Laterality: N/A;   ESOPHAGOGASTRODUODENOSCOPY (EGD) WITH PROPOFOL  N/A 07/04/2019   Procedure: ESOPHAGOGASTRODUODENOSCOPY (EGD) WITH PROPOFOL ;  Surgeon: Toledo, Ladell POUR, MD;  Location: [facility] ENDOSCOPY;  Service: Gastroenterology;  Laterality: N/A;   ESOPHAGOGASTRODUODENOSCOPY (EGD) WITH PROPOFOL  N/A 06/12/2023   Procedure: ESOPHAGOGASTRODUODENOSCOPY (EGD) WITH PROPOFOL ;  Surgeon: Maryruth Ole DASEN, MD;  Location: [facility] ENDOSCOPY;  Service: Endoscopy;  Laterality: N/A;  DM   TUBAL LIGATION  1981    Medications Prior to Admission  Medication Sig Dispense Refill Last Dose/Taking   apixaban (ELIQUIS) 5 MG TABS tablet Take 5 mg by mouth 2 (two) times daily.   01/24/2024   carvedilol  (COREG ) 3.125 MG tablet Take 1 tablet (3.125 mg total) by mouth 2 (two) times daily with a meal. 60 tablet 1 01/24/2024   digoxin  (LANOXIN ) 0.25 MG tablet Take 1 tablet (0.25 mg total) by mouth daily. 30 tablet 0 01/24/2024   furosemide  (LASIX ) 20 MG tablet Take 10 mg by mouth daily.   01/24/2024   gabapentin (NEURONTIN) 100 MG capsule Take 100 mg by mouth 2 (two) times daily.   01/24/2024   insulin  glargine (LANTUS  SOLOSTAR) 100 UNIT/ML Solostar Pen Inject 15 Units into the skin at bedtime. (Patient taking differently: Inject 20 Units into the skin at bedtime.) 4.5 mL 11 01/23/2024   Magnesium  Gluconate 550 MG TABS Take by mouth.  Taking   Vitamin D, Ergocalciferol, 2000 units CAPS Take 1 capsule by mouth daily.   Taking   acidophilus (RISAQUAD) CAPS capsule Take 2 capsules by mouth 3 (three) times daily. (Patient not taking: Reported on 03/04/2023) 30 capsule 0 Not Taking   albuterol  (VENTOLIN  HFA) 108 (90 Base) MCG/ACT inhaler Inhale 2 puffs into the lungs every 4 (four) hours as needed for shortness of breath. (Patient not taking: Reported on  03/04/2023)   Not Taking   amLODipine (NORVASC) 2.5 MG tablet Take 2.5 mg by mouth daily. (Patient not taking: Reported on 01/24/2024)   Not Taking   Calcium Carb-Cholecalciferol (CALCIUM 500+D PO) Take 1 tablet by mouth daily. (Patient not taking: Reported on 01/24/2024)   Not Taking   cloNIDine  (CATAPRES ) 0.2 MG tablet Take 0.2 mg by mouth 3 (three) times daily. (Patient not taking: Reported on 03/04/2023)   Not Taking   colchicine 0.6 MG tablet Take 0.6-1.8 mg by mouth as directed. (Patient not taking: Reported on 03/04/2023)   Not Taking   dexamethasone  (DECADRON ) 6 MG tablet Take 1 tablet (6 mg total) by mouth daily. (Patient not taking: Reported on 03/04/2023) 5 tablet 0 Not Taking   diltiazem  (TIAZAC ) 120 MG 24 hr capsule Take 120 mg by mouth daily. (Patient not taking: Reported on 03/04/2023)   Not Taking   fluticasone (FLONASE) 50 MCG/ACT nasal spray Place 1 spray into both nostrils 2 (two) times daily. (Patient not taking: Reported on 01/24/2024)   Not Taking   glimepiride  (AMARYL ) 4 MG tablet Take 4 mg by mouth 2 (two) times daily. (Patient not taking: Reported on 03/04/2023)   Not Taking   guaiFENesin -codeine 100-10 MG/5ML syrup Take 5 mLs by mouth every 4 (four) hours as needed for cough. (Patient not taking: Reported on 03/04/2023)   Not Taking   insulin  lispro (HUMALOG) 100 UNIT/ML injection Inject 12 Units into the skin 3 (three) times daily before meals. (Patient not taking: Reported on 01/24/2024)   Not Taking   Multiple Vitamin (MULTIVITAMIN WITH MINERALS) TABS tablet Take 1 tablet by mouth daily. (Patient not taking: Reported on 01/24/2024) 30 tablet 0 Not Taking   nystatin cream (MYCOSTATIN) Apply 1 Application topically 2 (two) times daily. (Patient not taking: Reported on 01/24/2024)   Not Taking   Semaglutide,0.25 or 0.5MG /DOS, (OZEMPIC, 0.25 OR 0.5 MG/DOSE,) 2 MG/1.5ML SOPN Inject into the skin. (Patient not taking: Reported on 03/04/2023)   Not Taking   spironolactone (ALDACTONE) 25  MG tablet Take 12.5 mg by mouth daily. (Patient not taking: Reported on 01/24/2024)   Not Taking   Turmeric 500 MG CAPS Take 1 capsule by mouth daily. (Patient not taking: Reported on 01/24/2024)   Not Taking   warfarin (COUMADIN ) 2 MG tablet Take 1 tablet (2 mg total) by mouth daily at 4 PM. (Patient not taking: Reported on 03/04/2023) 30 tablet 1 Not Taking   Social History   Socioeconomic History   Marital status: Married    Spouse name: Not on file   Number of children: Not on file   Years of education: Not on file   Highest education level: Not on file  Occupational History   Not on file  Tobacco Use   Smoking status: Never   Smokeless tobacco: Never  Vaping Use   Vaping status: Never Used  Substance and Sexual Activity   Alcohol use: No    Alcohol/week: 0.0 standard drinks of alcohol   Drug use: No   Sexual activity: Not on file  Comment: Married   Other Topics Concern   Not on file  Social History Narrative   Not on file   Social Drivers of Health   Financial Resource Strain: Low Risk  (11/02/2023)   Received from [facility]   Overall Financial Resource Strain (CARDIA)    Difficulty of Paying Living Expenses: Not hard at all  Food Insecurity: No Food Insecurity (01/24/2024)   Hunger Vital Sign    Worried About Running Out of Food in the Last Year: Never true    Ran Out of Food in the Last Year: Never true  Transportation Needs: No Transportation Needs (01/24/2024)   PRAPARE - Administrator, Civil Service (Medical): No    Lack of Transportation (Non-Medical): No  Physical Activity: Not on file  Stress: Not on file  Social Connections: Socially Integrated (01/26/2024)   Social Connection and Isolation Panel    Frequency of Communication with Friends and Family: Twice a week    Frequency of Social Gatherings with Friends and Family: Twice a week    Attends Religious Services: 1 to 4 times per year    Active Member of Clubs or  Organizations: Yes    Attends Banker Meetings: 1 to 4 times per year    Marital Status: Married  Catering Manager Violence: Not At Risk (01/24/2024)   Humiliation, Afraid, Rape, and Kick questionnaire    Fear of Current or Ex-Partner: No    Emotionally Abused: No    Physically Abused: No    Sexually Abused: No    Family History  Problem Relation Age of Onset   Cancer Mother 53       breast   Diabetes Mother    Breast cancer Mother 65   Coronary artery disease Mother    Stroke Father    Coronary artery disease Father    Diabetes Son      Vitals:   01/26/24 2342 01/27/24 0423 01/27/24 0500 01/27/24 0803  BP: (!) 167/85 (!) 151/82  (!) 176/88  Pulse: 61 63  65  Resp: 18 18  15   Temp: 97.6 F (36.4 C) (!) 97.4 F (36.3 C)  98.1 F (36.7 C)  TempSrc:      SpO2: 95% 92%  94%  Weight:   70.7 kg   Height:        PHYSICAL EXAM General: Well-appearing elderly female, well nourished, in no acute distress. HEENT: Normocephalic and atraumatic. Neck: No JVD.  Lungs: Normal respiratory effort on room air.   Heart: HRRR. Normal S1 and S2 without gallops or murmurs.  Abdomen: Non-distended appearing.  Msk: Normal strength and tone for age. Extremities: Warm and well perfused. No clubbing, cyanosis.  Trace edema.  Neuro: Alert and oriented X 3. Psych: Answers questions appropriately.   Labs: Basic Metabolic Panel: Recent Labs    01/26/24 0343 01/27/24 0532  NA 135 135  K 4.4 4.3  CL 100 99  CO2 24 23  GLUCOSE 161* 141*  BUN 40* 41*  CREATININE 1.93* 2.14*  CALCIUM 8.5* 8.7*   Liver Function Tests: No results for input(s): AST, ALT, ALKPHOS, BILITOT, PROT, ALBUMIN in the last 72 hours.  No results for input(s): LIPASE, AMYLASE in the last 72 hours. CBC: Recent Labs    01/25/24 0327 01/27/24 0532  WBC 6.9 6.5  HGB 12.7 13.5  HCT 38.4 41.9  MCV 86.7 87.7  PLT 212 227   Cardiac Enzymes: Recent Labs    01/24/24 1005  TROPONINIHS  28*   BNP: No results for input(s): BNP in the last 72 hours.  D-Dimer: No results for input(s): DDIMER in the last 72 hours.  Hemoglobin A1C: Recent Labs    01/24/24 1005  HGBA1C 8.0*   Fasting Lipid Panel: No results for input(s): CHOL, HDL, LDLCALC, TRIG, CHOLHDL, LDLDIRECT in the last 72 hours. Thyroid Function Tests: No results for input(s): TSH, T4TOTAL, T3FREE, THYROIDAB in the last 72 hours.  Invalid input(s): FREET3 Anemia Panel: No results for input(s): VITAMINB12, FOLATE, FERRITIN, TIBC, IRON, RETICCTPCT in the last 72 hours.   Radiology: US  Venous Img Lower Bilateral (DVT) Result Date: 01/24/2024 CLINICAL DATA:  Pulmonary embolism. EXAM: BILATERAL LOWER EXTREMITY VENOUS DOPPLER ULTRASOUND TECHNIQUE: Gray-scale sonography with compression, as well as color and duplex ultrasound, were performed to evaluate the deep venous system(s) from the level of the common femoral vein through the popliteal and proximal calf veins. COMPARISON:  None Available. FINDINGS: VENOUS Normal compressibility of the common femoral, superficial femoral, and popliteal veins, as well as the visualized calf veins. Visualized portions of profunda femoral vein and great saphenous vein unremarkable. No filling defects to suggest DVT on grayscale or color Doppler imaging. Doppler waveforms show normal direction of venous flow, normal respiratory plasticity and response to augmentation. Limited views of the contralateral common femoral vein are unremarkable. OTHER None. Limitations: none IMPRESSION: Negative. Electronically Signed   By: Leita Birmingham M.D.   On: 01/24/2024 14:19   CT Angio Chest PE W and/or Wo Contrast Addendum Date: 01/24/2024 ** ADDENDUM #1 ** ADDENDUM: The above findings were discussed with Dr. Levander at 9:53 am 01/24/24. ---------------------------------------------------- Electronically signed by: Evalene Coho MD 01/24/2024 10:49 AM EST RP Workstation:  [id number]   Result Date: 01/24/2024 ** ORIGINAL REPORT ** EXAM: CTA of the Chest with contrast for PE 01/24/2024 09:25:18 AM TECHNIQUE: CTA of the chest was performed without and with the administration of 75 mL of iohexol  (OMNIPAQUE ) 350 MG/ML injection. Multiplanar reformatted images are provided for review. MIP images are provided for review. Automated exposure control, iterative reconstruction, and/or weight based adjustment of the mA/kV was utilized to reduce the radiation dose to as low as reasonably achievable. COMPARISON: PA and lateral radiographs of the chest dated 01/24/2024. CLINICAL HISTORY: Pulmonary embolism (PE) suspected, low to intermediate prob, positive D-dimer. FINDINGS: PULMONARY ARTERIES: Pulmonary arteries are adequately opacified for evaluation. There is nonocclusive thromboembolic disease present within the distal right pulmonary artery and within the right lower lobe artery and anterior segmental branch of the right lower lobe pulmonary artery. There is also thromboembolus within the posterior segmental artery. There is no definite thromboembolic disease demonstrated in the left lung. Main pulmonary artery is normal in caliber. MEDIASTINUM: The heart is enlarged. There is no evidence of right ventricular strain. There is mild calcific coronary artery disease. The thoracic aorta demonstrates mild-to-moderate calcific atheromatous disease. The ascending thoracic aorta measures approximately 3.3 cm in diameter. LYMPH NODES: There are numerous enlarged mediastinal lymph nodes. There is also right hilar lymphadenopathy. No axillary lymphadenopathy. LUNGS AND PLEURA: There is mild dependent atelectasis within the right lung. There is a moderate right-sided pleural effusion. No pneumothorax. No focal consolidation or pulmonary edema. UPPER ABDOMEN: Limited images of the upper abdomen are unremarkable. SOFT TISSUES AND BONES: No acute bone or soft tissue abnormality. IMPRESSION: 1. Nonocclusive  pulmonary emboli involving the distal right pulmonary artery, right lower lobe artery, and anterior and posterior segmental branches; no left-sided PE identified. No CT evidence of right heart strain.  2. Moderate right pleural effusion. 3. Cardiomegaly with mild coronary artery calcifications and mild-to-moderate thoracic aortic atherosclerosis. 4. Enlarged mediastinal and right hilar lymph nodes; recommend clinical correlation and follow-up per oncologic/infectious/inflammatory considerations. Electronically signed by: Evalene Coho MD 01/24/2024 09:51 AM EST RP Workstation: [id number]   DG Chest 2 View Result Date: 01/24/2024 CLINICAL DATA:  Shortness of breath. EXAM: CHEST - 2 VIEW COMPARISON:  08/08/2020 FINDINGS: Mild cardiomegaly, with dual lead pacemaker in place. Small to moderate right pleural effusion. Right basilar atelectasis versus infiltrate. IMPRESSION: Small to moderate right pleural effusion, with right basilar atelectasis versus infiltrate. Electronically Signed   By: Norleen DELENA Kil M.D.   On: 01/24/2024 08:29    ECHO 12/2023: SEVERE LEFT VENTRICULAR SYSTOLIC DYSFUNCTION WITH MILD LVH  ESTIMATED EF: 25%, CALC EF(2D): 25%  ELEVATED LA PRESSURES WITH DIASTOLIC DYSFUNCTION (GRADE 3)  NORMAL RIGHT VENTRICULAR SYSTOLIC FUNCTION  VALVULAR REGURGITATION: TRIVIAL AR, MILD MR, No PR, MODERATE TR                           ESTIMATED RVSP: 56 mmHg (ABNORMAL)  NO VALVULAR STENOSIS         TELEMETRY (personally reviewed): Ventricular pacing rate 60s  EKG (personally reviewed): VP rate 61 bpm  Data reviewed by me 01/27/2024: last 24h vitals tele labs imaging I/O ED provider note, admission H&P, hospitalist progress note  Principal Problem:   CHF (congestive heart failure) (HCC) Active Problems:   Acute respiratory failure with hypoxia (HCC)   Acute pulmonary embolism (HCC)   Pleural effusion on right    ASSESSMENT AND PLAN:  Deborah Dawson is a 73 y.o. female  with a past medical  history of chronic HFrEF, persistent atrial fibrillation, hypertension, type 2 diabetes who presented to the ED on 01/24/2024 for SOB and LE edema. Cardiology was consulted for further evaluation.   # Acute on chronic HFrEF # Pulmonary emboli # Persistent atrial fibrillation # S/p CRT-P # Hypertension Patient presented with worsening shortness of breath, lower extremity edema, cough.  Chest x-ray concerning for heart failure with elevated BNP at 2500.  CTA chest did reveal multiple right sided pulmonary emboli and she was started on IV heparin . S/p R thoracentesis 01/26/2024 with 900 mL removed. - Continue IV heparin .  Xarelto copay is $0 - plan to start prior to DC once renal function improved. - Hold IV lasix  given elevation in Cr.  - Hold spironolactone today given uptrending Cr. Continue carvedilol  6.25 mg twice daily for improved BP control, GDMT.  Previously has not tolerated Entresto due to cough, Farxiga due to UTI and yeast infections, ARBs. -Minimal and flat troponin most consistent with demand/supply mismatch and not ACS in the setting of above.   This patient's plan of care was discussed and created with Dr. Florencio and he is in agreement.  Signed: Danita Bloch, PA-C  01/27/2024, 8:07 AM [facility]

## 2024-01-27 NOTE — Inpatient Diabetes Management (Signed)
 Inpatient Diabetes Program Recommendations  AACE/ADA: New Consensus Statement on Inpatient Glycemic Control   Target Ranges:  Prepandial:   less than 140 mg/dL      Peak postprandial:   less than 180 mg/dL (1-2 hours)      Critically ill patients:  140 - 180 mg/dL    Latest Reference Range & Units 01/26/24 07:23 01/26/24 13:56 01/26/24 16:12 01/26/24 19:55 01/27/24 07:54 01/27/24 12:04  Glucose-Capillary 70 - 99 mg/dL 848 (H) 820 (H) 769 (H) 214 (H) 150 (H) 264 (H)   Review of Glycemic Control  Diabetes history: DM2 Outpatient Diabetes medications: Lantus  20 units at bedtime, Amaryl  4 mg BID (not taking), Humalog 12 units TID (not taking), Ozempic (not taking) Current orders for Inpatient glycemic control: Novolog  0-9 units TID with meals  Inpatient Diabetes Program Recommendations:    Insulin : Please consider ordering Novolog  3 units TID with meals for meal coverage if patient eats at least 50% of meals.  Thanks, Earnie Gainer, RN, MSN, CDCES Diabetes Coordinator Inpatient Diabetes Program [phone] (Team Pager from 8am to 5pm)

## 2024-01-27 NOTE — Progress Notes (Signed)
 Heart Failure Stewardship Pharmacy Note  PCP: Sadie Manna, MD PCP-Cardiologist: None  HPI: Deborah Dawson is a 73 y.o. female with chronic HFrEF, s/p CRT-P 07/2021, persistent atrial fibrillation, hypertension, type 2 diabetes who presented with shortness of breath. On admission, BNP was 2499, HS-troponin was 31, and digoxin  level was 2.3. Chest x-ray noted small to moderate right pleural effusion.  CTA 01/24/24 noted nonocclusive pulmonary emboli involving the distal right pulmonary artery. Right lower lobe artery, and anterior/posterior segmental branches with no evidence of RHS. IR drained 900 cc from pleural space 01/26/24.  Pertinent cardiac history: TTE 06/2015 with LVEF of 20-25%. 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 TTE 07/2020 with LVEF improved ot 30-35% with mild RV dysfunction, mild MR, mild to moderate TR. In 07/2021 device upgraded to CRT-D with LBBB and dyssynchrony. TTE 12/2023 noted LVEF down again to 25% with G3DD, trivial AR, mild MR, moderate TR.  Pertinent Lab Values: Creatinine, Ser  Date Value Ref Range Status  01/27/2024 2.14 (H) 0.44 - 1.00 mg/dL Final   BUN  Date Value Ref Range Status  01/27/2024 41 (H) 8 - 23 mg/dL Final   Potassium  Date Value Ref Range Status  01/27/2024 4.3 3.5 - 5.1 mmol/L Final   Sodium  Date Value Ref Range Status  01/27/2024 135 135 - 145 mmol/L Final   B Natriuretic Peptide  Date Value Ref Range Status  01/24/2024 2,499.0 (H) 0.0 - 100.0 pg/mL Final    Comment:    Performed at [facility], [address] [place], [place] [address]   Magnesium   Date Value Ref Range Status  08/10/2020 2.0 1.7 - 2.4 mg/dL Final    Comment:    Performed at [facility], [address] [place], [place] [address]   Hgb A1c MFr Bld  Date Value Ref Range Status  01/24/2024 8.0 (H) 4.8 - 5.6 % Final    Comment:    (NOTE) Diagnosis of Diabetes The following HbA1c ranges recommended by the American Diabetes  Association (ADA) may be used as an aid in the diagnosis of diabetes mellitus.  Hemoglobin             Suggested A1C NGSP%              Diagnosis  <5.7                   Non Diabetic  5.7-6.4                Pre-Diabetic  >6.4                   Diabetic  <7.0                   Glycemic control for                       adults with diabetes.     Digoxin  Level  Date Value Ref Range Status  01/27/2024 1.6 0.8 - 2.0 ng/mL Final    Comment:    Performed at [facility], [address] [place], [place] [address]   TSH  Date Value Ref Range Status  06/25/2015 0.661 0.350 - 4.500 uIU/mL Final   LDH  Date Value Ref Range Status  08/08/2020 149 98 - 192 U/L Final    Comment:    Performed at [facility], [address], [place], [place] [address]    Vital Signs: Temp:  [97.4  F (36.3 C)-98.2 F (36.8 C)] 98.1 F (36.7 C) (11/05 0803) Pulse Rate:  [59-65] 65 (11/05 0803) Cardiac Rhythm: Ventricular paced (11/04 1900) Resp:  [15-20] 15 (11/05 0803) BP: (151-176)/(75-88) 176/88 (11/05 0803) SpO2:  [92 %-98 %] 94 % (11/05 0803) Weight:  [70.7 kg (155 lb 13.8 oz)] 70.7 kg (155 lb 13.8 oz) (11/05 0500)  Intake/Output Summary (Last 24 hours) at 01/27/2024 0805 Last data filed at 01/26/2024 1908 Gross per 24 hour  Intake 240 ml  Output 400 ml  Net -160 ml   Current Heart Failure Medications:  Loop diuretic: none Beta-Blocker: carvedilol  6.25 mg BID ACEI/ARB/ARNI: none MRA: spironolactone 12.5 mg daily SGLT2i:none  Other: digoxin  0.125 mg daily  Prior to admission Heart Failure Medications:  Loop diuretic: furosemide  10 mg daily Beta-Blocker: carvedilol  3.125 mg BID ACEI/ARB/ARNI:  none MRA: none SGLT2i: none Other: digoxin  0.25 mg daily  Assessment: 1. 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 NYHA class III symptoms. 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 -Symptoms: Patient reports shortness of breath has improved some after thoracentesis. Sleep quality last night was improved. Appetite is improved. -Volume: Does not appear overly volume overloaded on exam. Creatinine is up, agree with holding Lasix  and spironolactone. -Hemodynamics: BP is severely elevated. HR 60s.  -BB: continue carvedilol  6.25 mg BID -ACEI/ARB/ARNI: Unable to add at this time given eGFR.  -MRA: Spironolactone held due to AKI. Can consider restarting if/when creatinine improves to <2 with eGFR >30. -SGLT2i: Reportedly has a history of UTI. May not be ideal at this time.  -Digoxin  level was high on admission. The dose was reduced and repeat digoxin  level was lower but still high at 1.6. Digoxin  held.  -Patient noted gout reaction to hydralazine, which is not commonly a noted ADE. Can consider retrialing vs adding amlodipine given severe HTN and afterload.  Plan: 1) Medication changes recommended at this time: -Consider adding amlodipine 5 mg daily. Ideally would be able to transition off this to GDMT eventually pending renal function.  2) Patient assistance: -Pending  3) Education: - Patient has been educated on current HF medications and potential additions to HF medication regimen - Patient verbalizes understanding that over the next few months, these medication doses may change and more medications may be added to optimize HF regimen - Patient has been educated on basic disease state pathophysiology and goals of therapy  Medication Assistance / Insurance Benefits Check: Does the patient have prescription insurance?    Please do not hesitate to reach out with questions or concerns,  Jaun Bash, PharmD, CPP, BCPS, [facility] Heart Failure Pharmacist  Phone - [phone] 01/27/2024 8:05 AM

## 2024-01-27 NOTE — Hospital Course (Signed)
 73 y.o. female with medical history significant of PAF on Eliquis, chronic HFrEF LVEF 25% with recurrent right-sided pleural effusion, PPM-ICD, HTN, IDDM, gout, presented with worsening of shortness of breath and leg swelling.  Symptoms started 3 days ago patient started to feel increasing exertional dyspnea, and also developed orthopnea, in addition she also noticed increasing leg swelling left more than right.    CTA showed nonocclusive pulmonary emboli improved distal right pulmonary artery right lower lobe artery and anterior and posterior segment branch no left-sided PE and no signs of right heart strain.   11/4.  900 mL drawn off with right-sided thoracentesis. 11/5.  Creatinine worse at 2.14 today.  Will give a fluid bolus.  250 cc.  Recheck creatinine tomorrow.  Spironolactone on hold. 11/6.  Creatinine 1.97 with a GFR of 26.  Continue to hold spironolactone.  Coreg  dose increased for high blood pressure. 11/7.  700 mL drawn off with repeat right-sided thoracentesis cytology and flow cytometry sent off also.  1.84 this afternoon with a GFR of 28.

## 2024-01-27 NOTE — Assessment & Plan Note (Signed)
EKG showed paced rhythm.

## 2024-01-27 NOTE — Assessment & Plan Note (Addendum)
 AKI on CKD stage IIIb.  Creatinine 1.24 on presentation.  Creatinine peak 2.14.  Today's creatinine 1.6 with a GFR of 33.  Okay to start Xarelto and discharged home.

## 2024-01-27 NOTE — Assessment & Plan Note (Addendum)
 Holding diuresis again today with impaired kidney function.  Received 250 mL liter fluid bolus on 11/5.  On Coreg , Aldactone.  Holding off on Lasix  at this point.  Close clinical follow-up on when to restart.  Recommend checking a BMP with follow-up appointment.

## 2024-01-27 NOTE — Progress Notes (Signed)
 PHARMACY - ANTICOAGULATION CONSULT NOTE  Pharmacy Consult for Heparin  Indication: pulmonary embolus  Allergies  Allergen Reactions   Doxazosin Other (See Comments)    Cardura - cough   Doxycycline      Abdominal pain   Drug Class [Clindamycin/Lincomycin] Hives   Hydralazine Hcl Other (See Comments)    gout   Metformin  And Related Swelling   Ciprofloxacin Other (See Comments)    Numbness in face   Iodine Other (See Comments)   Pioglitazone Other (See Comments)   Sacubitril-Valsartan Cough    Pt states that it gave her a cough and chest palpitations   Semaglutide Nausea Only   Sulfamethoxazole-Trimethoprim Hives   Augmentin [Amoxicillin-Pot Clavulanate] Diarrhea   Dulaglutide Nausea Only   Hctz [Hydrochlorothiazide] Other (See Comments)    Gout   Losartan Diarrhea   Poison Oak Extract Rash   Red Dye #40 (Allura Red) Rash    Patient Measurements: Height: 5' 1 (154.9 cm) Weight: 70.7 kg (155 lb 13.8 oz) IBW/kg (Calculated) : 47.8 HEPARIN  DW (KG): 62.6  Vital Signs: Temp: 97.4 F (36.3 C) (11/05 0423) BP: 151/82 (11/05 0423) Pulse Rate: 63 (11/05 0423)  Labs: Recent Labs    01/24/24 0800 01/24/24 1005 01/24/24 2040 01/25/24 0327 01/25/24 0621 01/25/24 1605 01/26/24 0343 01/27/24 0532  HGB 14.2  --   --  12.7  --   --   --  13.5  HCT 45.5  --   --  38.4  --   --   --  41.9  PLT 239  --   --  212  --   --   --  227  APTT 48*  --    < >  --    < > 86* 77* 69*  LABPROT 21.1*  --   --   --   --   --   --   --   INR 1.7*  --   --   --   --   --   --   --   HEPARINUNFRC >1.10*  --   --  >1.10*  --   --  >1.10* 0.62  CREATININE 1.24*  --   --  1.39*  --   --  1.93*  --   TROPONINIHS 31* 28*  --   --   --   --   --   --    < > = values in this interval not displayed.    Estimated Creatinine Clearance: 22.7 mL/min (A) (by C-G formula based on SCr of 1.93 mg/dL (H)).   Medical History: Past Medical History:  Diagnosis Date   Allergic genetic state    Atrial  fibrillation (HCC) 2010   Cardiomyopathy (HCC)    CHF (congestive heart failure) (HCC)    Diabetes mellitus without complication (HCC)    Dysrhythmia    Gout    H/O cardiac catheterization    Hypertension    Joint pain    Osteopenia    Presence of permanent cardiac pacemaker    Psoriasis     Medications:  Medications Prior to Admission  Medication Sig Dispense Refill Last Dose/Taking   apixaban (ELIQUIS) 5 MG TABS tablet Take 5 mg by mouth 2 (two) times daily.   01/24/2024   carvedilol  (COREG ) 3.125 MG tablet Take 1 tablet (3.125 mg total) by mouth 2 (two) times daily with a meal. 60 tablet 1 01/24/2024   digoxin  (LANOXIN ) 0.25 MG tablet Take 1 tablet (0.25 mg total) by mouth daily. 30  tablet 0 01/24/2024   furosemide  (LASIX ) 20 MG tablet Take 10 mg by mouth daily.   01/24/2024   gabapentin (NEURONTIN) 100 MG capsule Take 100 mg by mouth 2 (two) times daily.   01/24/2024   insulin  glargine (LANTUS  SOLOSTAR) 100 UNIT/ML Solostar Pen Inject 15 Units into the skin at bedtime. (Patient taking differently: Inject 20 Units into the skin at bedtime.) 4.5 mL 11 01/23/2024   Magnesium  Gluconate 550 MG TABS Take by mouth.   Taking   Vitamin D, Ergocalciferol, 2000 units CAPS Take 1 capsule by mouth daily.   Taking   acidophilus (RISAQUAD) CAPS capsule Take 2 capsules by mouth 3 (three) times daily. (Patient not taking: Reported on 03/04/2023) 30 capsule 0 Not Taking   albuterol  (VENTOLIN  HFA) 108 (90 Base) MCG/ACT inhaler Inhale 2 puffs into the lungs every 4 (four) hours as needed for shortness of breath. (Patient not taking: Reported on 03/04/2023)   Not Taking   amLODipine (NORVASC) 2.5 MG tablet Take 2.5 mg by mouth daily. (Patient not taking: Reported on 01/24/2024)   Not Taking   Calcium Carb-Cholecalciferol (CALCIUM 500+D PO) Take 1 tablet by mouth daily. (Patient not taking: Reported on 01/24/2024)   Not Taking   cloNIDine  (CATAPRES ) 0.2 MG tablet Take 0.2 mg by mouth 3 (three) times daily.  (Patient not taking: Reported on 03/04/2023)   Not Taking   colchicine 0.6 MG tablet Take 0.6-1.8 mg by mouth as directed. (Patient not taking: Reported on 03/04/2023)   Not Taking   dexamethasone  (DECADRON ) 6 MG tablet Take 1 tablet (6 mg total) by mouth daily. (Patient not taking: Reported on 03/04/2023) 5 tablet 0 Not Taking   diltiazem  (TIAZAC ) 120 MG 24 hr capsule Take 120 mg by mouth daily. (Patient not taking: Reported on 03/04/2023)   Not Taking   fluticasone (FLONASE) 50 MCG/ACT nasal spray Place 1 spray into both nostrils 2 (two) times daily. (Patient not taking: Reported on 01/24/2024)   Not Taking   glimepiride  (AMARYL ) 4 MG tablet Take 4 mg by mouth 2 (two) times daily. (Patient not taking: Reported on 03/04/2023)   Not Taking   guaiFENesin -codeine 100-10 MG/5ML syrup Take 5 mLs by mouth every 4 (four) hours as needed for cough. (Patient not taking: Reported on 03/04/2023)   Not Taking   insulin  lispro (HUMALOG) 100 UNIT/ML injection Inject 12 Units into the skin 3 (three) times daily before meals. (Patient not taking: Reported on 01/24/2024)   Not Taking   Multiple Vitamin (MULTIVITAMIN WITH MINERALS) TABS tablet Take 1 tablet by mouth daily. (Patient not taking: Reported on 01/24/2024) 30 tablet 0 Not Taking   nystatin cream (MYCOSTATIN) Apply 1 Application topically 2 (two) times daily. (Patient not taking: Reported on 01/24/2024)   Not Taking   Semaglutide,0.25 or 0.5MG /DOS, (OZEMPIC, 0.25 OR 0.5 MG/DOSE,) 2 MG/1.5ML SOPN Inject into the skin. (Patient not taking: Reported on 03/04/2023)   Not Taking   spironolactone (ALDACTONE) 25 MG tablet Take 12.5 mg by mouth daily. (Patient not taking: Reported on 01/24/2024)   Not Taking   Turmeric 500 MG CAPS Take 1 capsule by mouth daily. (Patient not taking: Reported on 01/24/2024)   Not Taking   warfarin (COUMADIN ) 2 MG tablet Take 1 tablet (2 mg total) by mouth daily at 4 PM. (Patient not taking: Reported on 03/04/2023) 30 tablet 1 Not Taking    Scheduled:   carvedilol   6.25 mg Oral BID WC   digoxin   0.125 mg Oral Daily   gabapentin  100 mg Oral BID   insulin  aspart  0-9 Units Subcutaneous TID WC   isosorbide mononitrate  30 mg Oral Daily   sodium chloride  flush  3 mL Intravenous Q12H   spironolactone  12.5 mg Oral Daily   Infusions:   heparin  900 Units/hr (01/26/24 0753)   PRN:  Anti-infectives (From admission, onward)    None       Assessment: Patient is a 73 yo F presenting with SOB and an ongoing cough that has been present for several months. PMH significant for nonischemic cardiomyopathy, A-fib (on Eliquis), hypertension, T2DM, bradycardia resulted with s/p pacemaker. Pharmacy is consulted for heparin  dosing and monitoring. Last dose of Eliquis is unknown.  11/2: CT chest remarkable for nonocclusive pulmonary emboli involving the distal right pulmonary artery, right lower lobe artery, and anterior and posterior segmental branches.   Baseline CBC is stable. Baseline aPTT, INR, and HL ordered as add on labs.    Goal of Therapy:  Heparin  level 0.3-0.7 units/ml aPTT 66-102 seconds Monitor platelets by anticoagulation protocol: Yes   Date Time aPTT Rate/Comment  11/2 2040 108 Supratherapeutic @1000  units/hr 11/3 9378 87 Therapeutic x1 @900  units/hr 11/3 1605 86 Therapeutic x2 @900  units/hour 11/4     0343    77        Therapeutic x3 @ 900 units/hr  11/5     0532    69        Therapeutic x4 @ 900 units/hr   Plan:  11/5 @ 0532:  aPTT = 69,  HL = 0.62 - aPTT therapeutic X 4, now correlating with HL  - will use HL to guide dosing from here on  - recheck HL on 11/6 with AM labs  - CBC daily  Thank you for involving pharmacy in this patient's care.   Silver Selinda BIRCH, PharmD Clinical Pharmacist 01/27/2024 6:42 AM

## 2024-01-27 NOTE — Assessment & Plan Note (Addendum)
 On heparin  drip during hospital course.  Was on Eliquis as outpatient.  Patient hesitant about Coumadin .  We were waiting for her creatinine clearance to go above 30, so we can start Xarelto.  Xarelto starter pack ordered

## 2024-01-27 NOTE — Progress Notes (Signed)
 Progress Note   Patient: Deborah Dawson FMW:[id number] DOB: [birth year] DOA: 01/24/2024     3 DOS: the patient was seen and examined on 01/27/2024   Brief hospital course: 73 y.o. female with medical history significant of PAF on Eliquis, chronic HFrEF LVEF 25% with recurrent right-sided pleural effusion, PPM-ICD, HTN, IDDM, gout, presented with worsening of shortness of breath and leg swelling.  Symptoms started 3 days ago patient started to feel increasing exertional dyspnea, and also developed orthopnea, in addition she also noticed increasing leg swelling left more than right.    CTA showed nonocclusive pulmonary emboli improved distal right pulmonary artery right lower lobe artery and anterior and posterior segment branch no left-sided PE and no signs of right heart strain.   11/4.  900 mL drawn off with right-sided thoracentesis. 11/5.  Creatinine worse at 2.14 today.  Will give a fluid bolus.  250 cc.  Recheck creatinine tomorrow.  Spironolactone on hold.    Assessment and Plan: * Acute on chronic systolic CHF (congestive heart failure) (HCC) Holding diuresis today with worsening kidney function.  Will give 250 mL liter fluid bolus.  On Coreg .  Hold Aldactone today.  Pleural effusion on right 900 mL of fluid drawn underneath the right lung on 11/4.  Acute pulmonary embolism (HCC) Currently on heparin  drip.  Was on Eliquis as outpatient.  Patient hesitant about Coumadin .  With her creatinine up I am hesitant on starting Xarelto today.  Acute kidney injury superimposed on CKD AKI on CKD stage IIIb.  Creatinine 1.24 on presentation.  Today's creatinine 2.14.  Will give a fluid bolus.  Acute respiratory failure with hypoxia [facility]) Documented by admitting physician with increased respiratory effort on presentation.  This problem has resolved and patient breathing comfortably on room air.  Uncontrolled type 2 diabetes mellitus with hyperglycemia, with long-term current use of insulin   (HCC) Patient sliding-scale insulin .  We will restart Lantus .  Paroxysmal atrial fibrillation (HCC) EKG showed paced rhythm.        Subjective: Patient feels okay.  Breathing better than when she came in.  At 900 mL taken off underneath the right lung yesterday.  Admitted with CHF.  Now has worsening kidney function.  Physical Exam: Vitals:   01/27/24 0423 01/27/24 0500 01/27/24 0803 01/27/24 1204  BP: (!) 151/82  (!) 176/88 (!) 169/85  Pulse: 63  65 60  Resp: 18  15 16   Temp: (!) 97.4 F (36.3 C)  98.1 F (36.7 C) 97.6 F (36.4 C)  TempSrc:    Oral  SpO2: 92%  94% 95%  Weight:  70.7 kg    Height:       Physical Exam HENT:     Head: Normocephalic.     Mouth/Throat:     Pharynx: No oropharyngeal exudate.  Eyes:     General: Lids are normal.     Conjunctiva/sclera: Conjunctivae normal.  Cardiovascular:     Rate and Rhythm: Normal rate and regular rhythm.     Heart sounds: Normal heart sounds, S1 normal and S2 normal.  Pulmonary:     Breath sounds: Examination of the right-lower field reveals decreased breath sounds. Examination of the left-lower field reveals decreased breath sounds. Decreased breath sounds present. No wheezing, rhonchi or rales.  Abdominal:     Palpations: Abdomen is soft.     Tenderness: There is no abdominal tenderness.  Musculoskeletal:     Right lower leg: Swelling present.     Left lower leg: Swelling present.  Skin:  General: Skin is warm.     Findings: No rash.  Neurological:     Mental Status: She is alert and oriented to person, place, and time.     Data Reviewed: Creatinine 2.14, BUN 41, GFR 24, CBC normal range  Family Communication: Declined  Disposition: Status is: Inpatient Remains inpatient appropriate because: Will give a fluid bolus today and reassess creatinine tomorrow  Planned Discharge Destination: Home    Time spent: 28 minutes  Author: Charlie Patterson, MD 01/27/2024 1:39 PM  For on call review  [email or web address].

## 2024-01-28 DIAGNOSIS — J9601 Acute respiratory failure with hypoxia: Secondary | ICD-10-CM | POA: Diagnosis not present

## 2024-01-28 DIAGNOSIS — I2699 Other pulmonary embolism without acute cor pulmonale: Secondary | ICD-10-CM | POA: Diagnosis not present

## 2024-01-28 DIAGNOSIS — N179 Acute kidney failure, unspecified: Secondary | ICD-10-CM | POA: Diagnosis not present

## 2024-01-28 DIAGNOSIS — I5023 Acute on chronic systolic (congestive) heart failure: Secondary | ICD-10-CM | POA: Diagnosis not present

## 2024-01-28 LAB — GLUCOSE, CAPILLARY
Glucose-Capillary: 129 mg/dL — ABNORMAL HIGH (ref 70–99)
Glucose-Capillary: 222 mg/dL — ABNORMAL HIGH (ref 70–99)
Glucose-Capillary: 129 mg/dL — ABNORMAL HIGH (ref 70–99)
Glucose-Capillary: 185 mg/dL — ABNORMAL HIGH (ref 70–99)

## 2024-01-28 LAB — BASIC METABOLIC PANEL WITH GFR
Anion gap: 12 (ref 5–15)
BUN: 36 mg/dL — ABNORMAL HIGH (ref 8–23)
CO2: 24 mmol/L (ref 22–32)
Calcium: 8.6 mg/dL — ABNORMAL LOW (ref 8.9–10.3)
Chloride: 99 mmol/L (ref 98–111)
Creatinine, Ser: 1.97 mg/dL — ABNORMAL HIGH (ref 0.44–1.00)
GFR, Estimated: 26 mL/min — ABNORMAL LOW (ref >60)
Glucose, Bld: 138 mg/dL — ABNORMAL HIGH (ref 70–99)
Potassium: 4.6 mmol/L (ref 3.5–5.1)
Sodium: 135 mmol/L (ref 135–145)

## 2024-01-28 LAB — HEPARIN LEVEL (UNFRACTIONATED): Heparin Unfractionated: 0.31 [IU]/mL (ref 0.30–0.70)

## 2024-01-28 MED ORDER — CARVEDILOL 12.5 MG PO TABS: 12.5000 mg | ORAL_TABLET | Freq: Two times a day (BID) | ORAL | Status: DC

## 2024-01-28 MED ORDER — SPIRONOLACTONE 12.5 MG HALF TABLET
12.5000 mg | ORAL_TABLET | Freq: Every day | ORAL | Status: DC
  Administered 2024-01-29 – 2024-01-30 (×2): 12.5 mg via ORAL
  Filled 2024-01-28 (×2): qty 1

## 2024-01-28 MED ORDER — CARVEDILOL 12.5 MG PO TABS
12.5000 mg | ORAL_TABLET | Freq: Two times a day (BID) | ORAL | Status: DC
  Administered 2024-01-28 – 2024-01-30 (×5): 12.5 mg via ORAL
  Filled 2024-01-28 (×5): qty 1

## 2024-01-28 NOTE — TOC CM/SW Note (Signed)
 Transition of Care [facility]) - Inpatient Brief Assessment   Patient Details  Name: ASHAKI FROSCH MRN: [id number] Date of Birth: June 05, 1950  Transition of Care [facility]) CM/SW Contact:    Shasta DELENA Daring, RN Phone Number: 01/28/2024, 10:53 AM   Clinical Narrative:  RNCM reviewed chart. No TOC needs identified at this time. Will continue to follow progress. Please place [facility] consult if needs arise.  Transition of Care Asessment: Insurance and Status: Insurance coverage has been reviewed Patient has primary care physician: Yes Home environment has been reviewed: Single family home Prior level of function:: independent Prior/Current Home Services: No current home services Social Drivers of Health Review: SDOH reviewed no interventions necessary Readmission risk has been reviewed: Yes Transition of care needs: no transition of care needs at this time

## 2024-01-28 NOTE — Progress Notes (Signed)
 Mobility Specialist - Progress Note    01/28/24 1600  Mobility  Activity Placed arms in swimmer's position - left arm up;Placed arms in swimmer's position - right arm up;Ambulated independently  Level of Assistance Independent  Assistive Device None  Distance Ambulated (ft) 12 ft  Range of Motion/Exercises All extremities  Activity Response Tolerated fair  Mobility visit 1 Mobility  Mobility Specialist Start Time (ACUTE ONLY) 1057  Mobility Specialist Stop Time (ACUTE ONLY) 1116  Mobility Specialist Time Calculation (min) (ACUTE ONLY) 19 min   Pt was in chair position on RA upon entry. Pt agreed to mobility. Pt stated not feeling well and had a BM. Pt is in bed with needs in reach.  Clem Rodes Mobility Specialist 01/28/24, 4:23 PM

## 2024-01-28 NOTE — Progress Notes (Signed)
 PHARMACY - ANTICOAGULATION CONSULT NOTE  Pharmacy Consult for Heparin  Indication: pulmonary embolus  Allergies  Allergen Reactions   Doxazosin Other (See Comments)    Cardura - cough   Doxycycline      Abdominal pain   Drug Class [Clindamycin/Lincomycin] Hives   Hydralazine Hcl Other (See Comments)    gout   Metformin  And Related Swelling   Ciprofloxacin Other (See Comments)    Numbness in face   Iodine Other (See Comments)   Pioglitazone Other (See Comments)   Sacubitril-Valsartan Cough    Pt states that it gave her a cough and chest palpitations   Semaglutide Nausea Only   Sulfamethoxazole-Trimethoprim Hives   Augmentin [Amoxicillin-Pot Clavulanate] Diarrhea   Dulaglutide Nausea Only   Hctz [Hydrochlorothiazide] Other (See Comments)    Gout   Losartan Diarrhea   Poison Oak Extract Rash   Red Dye #40 (Allura Red) Rash    Patient Measurements: Height: 5' 1 (154.9 cm) Weight: 70.7 kg (155 lb 13.8 oz) IBW/kg (Calculated) : 47.8 HEPARIN  DW (KG): 62.6  Vital Signs: Temp: 97.9 F (36.6 C) (11/05 2304) BP: 159/76 (11/05 2304) Pulse Rate: 59 (11/05 2304)  Labs: Recent Labs    01/25/24 1605 01/26/24 0343 01/27/24 0532 01/28/24 0403  HGB  --   --  13.5  --   HCT  --   --  41.9  --   PLT  --   --  227  --   APTT 86* 77* 69*  --   HEPARINUNFRC  --  >1.10* 0.62 0.31  CREATININE  --  1.93* 2.14* 1.97*    Estimated Creatinine Clearance: 22.2 mL/min (A) (by C-G formula based on SCr of 1.97 mg/dL (H)).   Medical History: Past Medical History:  Diagnosis Date   Allergic genetic state    Atrial fibrillation (HCC) 2010   Cardiomyopathy (HCC)    CHF (congestive heart failure) (HCC)    Diabetes mellitus without complication (HCC)    Dysrhythmia    Gout    H/O cardiac catheterization    Hypertension    Joint pain    Osteopenia    Presence of permanent cardiac pacemaker    Psoriasis     Medications:  Medications Prior to Admission  Medication Sig Dispense  Refill Last Dose/Taking   apixaban (ELIQUIS) 5 MG TABS tablet Take 5 mg by mouth 2 (two) times daily.   01/24/2024   carvedilol  (COREG ) 3.125 MG tablet Take 1 tablet (3.125 mg total) by mouth 2 (two) times daily with a meal. 60 tablet 1 01/24/2024   digoxin  (LANOXIN ) 0.25 MG tablet Take 1 tablet (0.25 mg total) by mouth daily. 30 tablet 0 01/24/2024   furosemide  (LASIX ) 20 MG tablet Take 10 mg by mouth daily.   01/24/2024   gabapentin (NEURONTIN) 100 MG capsule Take 100 mg by mouth 2 (two) times daily.   01/24/2024   insulin  glargine (LANTUS  SOLOSTAR) 100 UNIT/ML Solostar Pen Inject 15 Units into the skin at bedtime. (Patient taking differently: Inject 20 Units into the skin at bedtime.) 4.5 mL 11 01/23/2024   Magnesium  Gluconate 550 MG TABS Take by mouth.   Taking   Vitamin D, Ergocalciferol, 2000 units CAPS Take 1 capsule by mouth daily.   Taking   acidophilus (RISAQUAD) CAPS capsule Take 2 capsules by mouth 3 (three) times daily. (Patient not taking: Reported on 03/04/2023) 30 capsule 0 Not Taking   albuterol  (VENTOLIN  HFA) 108 (90 Base) MCG/ACT inhaler Inhale 2 puffs into the lungs every 4 (four) hours  as needed for shortness of breath. (Patient not taking: Reported on 03/04/2023)   Not Taking   amLODipine (NORVASC) 2.5 MG tablet Take 2.5 mg by mouth daily. (Patient not taking: Reported on 01/24/2024)   Not Taking   Calcium Carb-Cholecalciferol (CALCIUM 500+D PO) Take 1 tablet by mouth daily. (Patient not taking: Reported on 01/24/2024)   Not Taking   cloNIDine  (CATAPRES ) 0.2 MG tablet Take 0.2 mg by mouth 3 (three) times daily. (Patient not taking: Reported on 03/04/2023)   Not Taking   colchicine 0.6 MG tablet Take 0.6-1.8 mg by mouth as directed. (Patient not taking: Reported on 03/04/2023)   Not Taking   dexamethasone  (DECADRON ) 6 MG tablet Take 1 tablet (6 mg total) by mouth daily. (Patient not taking: Reported on 03/04/2023) 5 tablet 0 Not Taking   diltiazem  (TIAZAC ) 120 MG 24 hr capsule Take 120  mg by mouth daily. (Patient not taking: Reported on 03/04/2023)   Not Taking   fluticasone (FLONASE) 50 MCG/ACT nasal spray Place 1 spray into both nostrils 2 (two) times daily. (Patient not taking: Reported on 01/24/2024)   Not Taking   glimepiride  (AMARYL ) 4 MG tablet Take 4 mg by mouth 2 (two) times daily. (Patient not taking: Reported on 03/04/2023)   Not Taking   guaiFENesin -codeine 100-10 MG/5ML syrup Take 5 mLs by mouth every 4 (four) hours as needed for cough. (Patient not taking: Reported on 03/04/2023)   Not Taking   insulin  lispro (HUMALOG) 100 UNIT/ML injection Inject 12 Units into the skin 3 (three) times daily before meals. (Patient not taking: Reported on 01/24/2024)   Not Taking   Multiple Vitamin (MULTIVITAMIN WITH MINERALS) TABS tablet Take 1 tablet by mouth daily. (Patient not taking: Reported on 01/24/2024) 30 tablet 0 Not Taking   nystatin cream (MYCOSTATIN) Apply 1 Application topically 2 (two) times daily. (Patient not taking: Reported on 01/24/2024)   Not Taking   Semaglutide,0.25 or 0.5MG /DOS, (OZEMPIC, 0.25 OR 0.5 MG/DOSE,) 2 MG/1.5ML SOPN Inject into the skin. (Patient not taking: Reported on 03/04/2023)   Not Taking   spironolactone (ALDACTONE) 25 MG tablet Take 12.5 mg by mouth daily. (Patient not taking: Reported on 01/24/2024)   Not Taking   Turmeric 500 MG CAPS Take 1 capsule by mouth daily. (Patient not taking: Reported on 01/24/2024)   Not Taking   warfarin (COUMADIN ) 2 MG tablet Take 1 tablet (2 mg total) by mouth daily at 4 PM. (Patient not taking: Reported on 03/04/2023) 30 tablet 1 Not Taking   Scheduled:   carvedilol   6.25 mg Oral BID WC   gabapentin  100 mg Oral BID   insulin  aspart  0-9 Units Subcutaneous TID WC   insulin  glargine-yfgn  15 Units Subcutaneous QHS   isosorbide mononitrate  30 mg Oral Daily   sodium chloride  flush  3 mL Intravenous Q12H   spironolactone  12.5 mg Oral Daily   Infusions:   heparin  900 Units/hr (01/27/24 1050)   PRN:   Anti-infectives (From admission, onward)    None       Assessment: Patient is a 73 yo F presenting with SOB and an ongoing cough that has been present for several months. PMH significant for nonischemic cardiomyopathy, A-fib (on Eliquis), hypertension, T2DM, bradycardia resulted with s/p pacemaker. Pharmacy is consulted for heparin  dosing and monitoring. Last dose of Eliquis is unknown.  11/2: CT chest remarkable for nonocclusive pulmonary emboli involving the distal right pulmonary artery, right lower lobe artery, and anterior and posterior segmental branches.  Baseline CBC is stable. Baseline aPTT, INR, and HL ordered as add on labs.    Goal of Therapy:  Heparin  level 0.3-0.7 units/ml aPTT 66-102 seconds Monitor platelets by anticoagulation protocol: Yes   Date Time aPTT Rate/Comment  11/2 2040 108 Supratherapeutic @1000  units/hr 11/3 9378 87 Therapeutic x1 @900  units/hr 11/3 1605 86 Therapeutic x2 @900  units/hour 11/4     0343    77        Therapeutic x3 @ 900 units/hr  11/5     0532    69        Therapeutic x4 @ 900 units/hr    11/6:  HL @ 0532 = 0.31, therapeutic (low end, trending down)  Plan:  11/6:  HL @ 0532 = 0.31, therapeutic X 5 - HL is therapeutic but at lowest end of range and trending down so will increase heparin  drip rate to 1000 units/hr - recheck HL on 11/7 with AM labs  - CBC daily  Thank you for involving pharmacy in this patient's care.   Silver Selinda BIRCH, PharmD Clinical Pharmacist 01/28/2024 4:56 AM

## 2024-01-28 NOTE — Progress Notes (Signed)
 Heart Failure Stewardship Pharmacy Note  PCP: Sadie Manna, MD PCP-Cardiologist: None  HPI: Deborah Dawson is a 73 y.o. female with chronic HFrEF, s/p CRT-P 07/2021, persistent atrial fibrillation, hypertension, type 2 diabetes who presented with shortness of breath. On admission, BNP was 2499, HS-troponin was 31, and digoxin  level was 2.3. Chest x-ray noted small to moderate right pleural effusion.  CTA 01/24/24 noted nonocclusive pulmonary emboli involving the distal right pulmonary artery. Right lower lobe artery, and anterior/posterior segmental branches with no evidence of RHS. IR drained 900 cc from pleural space 01/26/24.  Pertinent cardiac history: TTE 06/2015 with LVEF of 20-25%. 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 TTE 07/2020 with LVEF improved ot 30-35% with mild RV dysfunction, mild MR, mild to moderate TR. In 07/2021 device upgraded to CRT-D with LBBB and dyssynchrony. TTE 12/2023 noted LVEF down again to 25% with G3DD, trivial AR, mild MR, moderate TR.  Pertinent Lab Values: Creatinine, Ser  Date Value Ref Range Status  01/28/2024 1.97 (H) 0.44 - 1.00 mg/dL Final   BUN  Date Value Ref Range Status  01/28/2024 36 (H) 8 - 23 mg/dL Final   Potassium  Date Value Ref Range Status  01/28/2024 4.6 3.5 - 5.1 mmol/L Final   Sodium  Date Value Ref Range Status  01/28/2024 135 135 - 145 mmol/L Final   B Natriuretic Peptide  Date Value Ref Range Status  01/24/2024 2,499.0 (H) 0.0 - 100.0 pg/mL Final    Comment:    Performed at [facility], [address] [place], [place] [address]   Magnesium   Date Value Ref Range Status  08/10/2020 2.0 1.7 - 2.4 mg/dL Final    Comment:    Performed at [facility], [address] [place], [place] [address]   Hgb A1c MFr Bld  Date Value Ref Range Status  01/24/2024 8.0 (H) 4.8 - 5.6 % Final    Comment:    (NOTE) Diagnosis of Diabetes The following HbA1c ranges recommended by the American Diabetes  Association (ADA) may be used as an aid in the diagnosis of diabetes mellitus.  Hemoglobin             Suggested A1C NGSP%              Diagnosis  <5.7                   Non Diabetic  5.7-6.4                Pre-Diabetic  >6.4                   Diabetic  <7.0                   Glycemic control for                       adults with diabetes.     Digoxin  Level  Date Value Ref Range Status  01/27/2024 1.6 0.8 - 2.0 ng/mL Final    Comment:    Performed at [facility], [address] [place], [place] [address]   TSH  Date Value Ref Range Status  06/25/2015 0.661 0.350 - 4.500 uIU/mL Final   LDH  Date Value Ref Range Status  08/08/2020 149 98 - 192 U/L Final    Comment:    Performed at [facility], [address] [place], [place] [address]    Vital Signs: Temp:  [97.6  F (36.4 C)-98.1 F (36.7 C)] 97.8 F (36.6 C) (11/06 0459) Pulse Rate:  [59-65] 59 (11/06 0459) Cardiac Rhythm: Ventricular paced (11/05 1900) Resp:  [15-20] 20 (11/06 0459) BP: (159-176)/(76-88) 165/87 (11/06 0459) SpO2:  [92 %-96 %] 92 % (11/06 0459) Weight:  [71.4 kg (157 lb 6.5 oz)] 71.4 kg (157 lb 6.5 oz) (11/06 0500)  Intake/Output Summary (Last 24 hours) at 01/28/2024 0645 Last data filed at 01/28/2024 0501 Gross per 24 hour  Intake 1006.2 ml  Output 400 ml  Net 606.2 ml   Current Heart Failure Medications:  Loop diuretic: none Beta-Blocker: carvedilol  6.25 mg BID ACEI/ARB/ARNI: none MRA: spironolactone 12.5 mg daily SGLT2i:none  Other: digoxin  0.125 mg daily  Prior to admission Heart Failure Medications:  Loop diuretic: furosemide  10 mg daily Beta-Blocker: carvedilol  3.125 mg BID ACEI/ARB/ARNI:  none MRA: none SGLT2i: none Other: digoxin  0.25 mg daily  Assessment: 1. 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 NYHA class III symptoms. 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 -Symptoms: Patient reports shortness of breath has improved some after thoracentesis. Sleep quality last night was poor.  -Volume: Does not appear overly volume overloaded on exam. Creatinine is up, agree with holding Lasix  and spironolactone. -Hemodynamics: BP is severely elevated. HR 50s.  -BB: Continue carvedilol  6.25 mg BID. Titration limited by HR. -ACEI/ARB/ARNI: Unable to add at this time given eGFR.  -MRA: Spironolactone held due to AKI and restarted today. Will monitor volume status closely -SGLT2i: Reportedly has a history of UTI. May not be ideal at this time.  -Digoxin  level was high on admission. The dose was reduced and repeat digoxin  level was lower but still high at 1.6. Digoxin  held.  -Patient noted gout reaction to hydralazine, which is not commonly a noted ADE. Patient refuses to attempt amlodipine.  Plan: 1) Medication changes recommended at this time: - None. Patient refused safe renal options for BP lowering.  2) Patient assistance: -Pending  3) Education: - Patient has been educated on current HF medications and potential additions to HF medication regimen - Patient verbalizes understanding that over the next few months, these medication doses may change and more medications may be added to optimize HF regimen - Patient has been educated on basic disease state pathophysiology and goals of therapy  Medication Assistance / Insurance Benefits Check: Does the patient have prescription insurance?    Please do not hesitate to reach out with questions or concerns,  Jaun Bash, PharmD, CPP, BCPS, [facility] Heart Failure Pharmacist  Phone - [phone] 01/28/2024 6:45 AM

## 2024-01-28 NOTE — Progress Notes (Signed)
 Deborah Dawson CLINIC CARDIOLOGY PROGRESS NOTE       Patient ID: Deborah Dawson MRN: [id number] DOB/AGE: December 07, 1950 73 y.o.  Admit date: 01/24/2024 Referring Physician Dr. Cort Mana Primary Physician Sadie Manna, MD  Primary Cardiologist Dr. Ammon Reason for Consultation acute heart failure, PE  HPI: Deborah Dawson is a 73 y.o. female  with a past medical history of chronic HFrEF, s/p CRT-P 07/2021, persistent atrial fibrillation, hypertension, type 2 diabetes who presented to the ED on 01/24/2024 for SOB and LE edema. Cardiology was consulted for further evaluation.   Interval history: -Patient seen and examined this AM, resting comfortably in hospital bed.  -Breathing stable today. No significant cough today.  -No chest pain or palpitations. No events noted on tele.  -Renal function slightly improved on AM labs.  Review of systems complete and found to be negative unless listed above    Past Medical History:  Diagnosis Date   Allergic genetic state    Atrial fibrillation (HCC) 2010   Cardiomyopathy (HCC)    CHF (congestive heart failure) (HCC)    Diabetes mellitus without complication (HCC)    Dysrhythmia    Gout    H/O cardiac catheterization    Hypertension    Joint pain    Osteopenia    Presence of permanent cardiac pacemaker    Psoriasis     Past Surgical History:  Procedure Laterality Date   ABDOMINAL HYSTERECTOMY  1985   BREAST EXCISIONAL BIOPSY Left    negative years ago   BREAST SURGERY Left 1997   papilloma   CARDIAC CATHETERIZATION Left 08/01/2015   Procedure: Left Heart Cath and Coronary Angiography;  Surgeon: Vinie DELENA Jude, MD;  Location: [facility] INVASIVE CV LAB;  Service: Cardiovascular;  Laterality: Left;   CHOLECYSTECTOMY  1986   COLONOSCOPY WITH PROPOFOL  N/A 08/26/2017   Procedure: COLONOSCOPY WITH PROPOFOL ;  Surgeon: Toledo, Ladell POUR, MD;  Location: [facility] ENDOSCOPY;  Service: Gastroenterology;  Laterality: N/A;   ESOPHAGOGASTRODUODENOSCOPY (EGD) WITH  PROPOFOL  N/A 08/26/2017   Procedure: ESOPHAGOGASTRODUODENOSCOPY (EGD) WITH PROPOFOL ;  Surgeon: Toledo, Ladell POUR, MD;  Location: [facility] ENDOSCOPY;  Service: Gastroenterology;  Laterality: N/A;   ESOPHAGOGASTRODUODENOSCOPY (EGD) WITH PROPOFOL  N/A 07/04/2019   Procedure: ESOPHAGOGASTRODUODENOSCOPY (EGD) WITH PROPOFOL ;  Surgeon: Toledo, Ladell POUR, MD;  Location: [facility] ENDOSCOPY;  Service: Gastroenterology;  Laterality: N/A;   ESOPHAGOGASTRODUODENOSCOPY (EGD) WITH PROPOFOL  N/A 06/12/2023   Procedure: ESOPHAGOGASTRODUODENOSCOPY (EGD) WITH PROPOFOL ;  Surgeon: Maryruth Ole DASEN, MD;  Location: [facility] ENDOSCOPY;  Service: Endoscopy;  Laterality: N/A;  DM   TUBAL LIGATION  1981    Medications Prior to Admission  Medication Sig Dispense Refill Last Dose/Taking   apixaban (ELIQUIS) 5 MG TABS tablet Take 5 mg by mouth 2 (two) times daily.   01/24/2024   carvedilol  (COREG ) 3.125 MG tablet Take 1 tablet (3.125 mg total) by mouth 2 (two) times daily with a meal. 60 tablet 1 01/24/2024   digoxin  (LANOXIN ) 0.25 MG tablet Take 1 tablet (0.25 mg total) by mouth daily. 30 tablet 0 01/24/2024   furosemide  (LASIX ) 20 MG tablet Take 10 mg by mouth daily.   01/24/2024   gabapentin (NEURONTIN) 100 MG capsule Take 100 mg by mouth 2 (two) times daily.   01/24/2024   insulin  glargine (LANTUS  SOLOSTAR) 100 UNIT/ML Solostar Pen Inject 15 Units into the skin at bedtime. (Patient taking differently: Inject 20 Units into the skin at bedtime.) 4.5 mL 11 01/23/2024   Magnesium  Gluconate 550 MG TABS Take by mouth.   Taking  Vitamin D, Ergocalciferol, 2000 units CAPS Take 1 capsule by mouth daily.   Taking   acidophilus (RISAQUAD) CAPS capsule Take 2 capsules by mouth 3 (three) times daily. (Patient not taking: Reported on 03/04/2023) 30 capsule 0 Not Taking   albuterol  (VENTOLIN  HFA) 108 (90 Base) MCG/ACT inhaler Inhale 2 puffs into the lungs every 4 (four) hours as needed for shortness of breath. (Patient not taking: Reported on 03/04/2023)    Not Taking   amLODipine (NORVASC) 2.5 MG tablet Take 2.5 mg by mouth daily. (Patient not taking: Reported on 01/24/2024)   Not Taking   Calcium Carb-Cholecalciferol (CALCIUM 500+D PO) Take 1 tablet by mouth daily. (Patient not taking: Reported on 01/24/2024)   Not Taking   cloNIDine  (CATAPRES ) 0.2 MG tablet Take 0.2 mg by mouth 3 (three) times daily. (Patient not taking: Reported on 03/04/2023)   Not Taking   colchicine 0.6 MG tablet Take 0.6-1.8 mg by mouth as directed. (Patient not taking: Reported on 03/04/2023)   Not Taking   dexamethasone  (DECADRON ) 6 MG tablet Take 1 tablet (6 mg total) by mouth daily. (Patient not taking: Reported on 03/04/2023) 5 tablet 0 Not Taking   diltiazem  (TIAZAC ) 120 MG 24 hr capsule Take 120 mg by mouth daily. (Patient not taking: Reported on 03/04/2023)   Not Taking   fluticasone (FLONASE) 50 MCG/ACT nasal spray Place 1 spray into both nostrils 2 (two) times daily. (Patient not taking: Reported on 01/24/2024)   Not Taking   glimepiride  (AMARYL ) 4 MG tablet Take 4 mg by mouth 2 (two) times daily. (Patient not taking: Reported on 03/04/2023)   Not Taking   guaiFENesin -codeine 100-10 MG/5ML syrup Take 5 mLs by mouth every 4 (four) hours as needed for cough. (Patient not taking: Reported on 03/04/2023)   Not Taking   insulin  lispro (HUMALOG) 100 UNIT/ML injection Inject 12 Units into the skin 3 (three) times daily before meals. (Patient not taking: Reported on 01/24/2024)   Not Taking   Multiple Vitamin (MULTIVITAMIN WITH MINERALS) TABS tablet Take 1 tablet by mouth daily. (Patient not taking: Reported on 01/24/2024) 30 tablet 0 Not Taking   nystatin cream (MYCOSTATIN) Apply 1 Application topically 2 (two) times daily. (Patient not taking: Reported on 01/24/2024)   Not Taking   Semaglutide,0.25 or 0.5MG /DOS, (OZEMPIC, 0.25 OR 0.5 MG/DOSE,) 2 MG/1.5ML SOPN Inject into the skin. (Patient not taking: Reported on 03/04/2023)   Not Taking   spironolactone (ALDACTONE) 25 MG tablet  Take 12.5 mg by mouth daily. (Patient not taking: Reported on 01/24/2024)   Not Taking   Turmeric 500 MG CAPS Take 1 capsule by mouth daily. (Patient not taking: Reported on 01/24/2024)   Not Taking   warfarin (COUMADIN ) 2 MG tablet Take 1 tablet (2 mg total) by mouth daily at 4 PM. (Patient not taking: Reported on 03/04/2023) 30 tablet 1 Not Taking   Social History   Socioeconomic History   Marital status: Married    Spouse name: Not on file   Number of children: Not on file   Years of education: Not on file   Highest education level: Not on file  Occupational History   Not on file  Tobacco Use   Smoking status: Never   Smokeless tobacco: Never  Vaping Use   Vaping status: Never Used  Substance and Sexual Activity   Alcohol use: No    Alcohol/week: 0.0 standard drinks of alcohol   Drug use: No   Sexual activity: Not on file    Comment:  Married   Other Topics Concern   Not on file  Social History Narrative   Not on file   Social Drivers of Health   Financial Resource Strain: Low Risk  (11/02/2023)   [facility]   Overall Financial Resource Strain (CARDIA)    Difficulty of Paying Living Expenses: Not hard at all  Food Insecurity: No Food Insecurity (01/24/2024)   Hunger Vital Sign    Worried About Running Out of Food in the Last Year: Never true    Ran Out of Food in the Last Year: Never true  Transportation Needs: No Transportation Needs (01/24/2024)   PRAPARE - Administrator, Civil Service (Medical): No    Lack of Transportation (Non-Medical): No  Physical Activity: Not on file  Stress: Not on file  Social Connections: Socially Integrated (01/26/2024)   Social Connection and Isolation Panel    Frequency of Communication with Friends and Family: Twice a week    Frequency of Social Gatherings with Friends and Family: Twice a week    Attends Religious Services: 1 to 4 times per year    Active Member of [employer] or Organizations: Yes     Attends Banker Meetings: 1 to 4 times per year    Marital Status: Married  Catering Manager Violence: Not At Risk (01/24/2024)   Humiliation, Afraid, Rape, and Kick questionnaire    Fear of Current or Ex-Partner: No    Emotionally Abused: No    Physically Abused: No    Sexually Abused: No    Family History  Problem Relation Age of Onset   Cancer Mother 35       breast   Diabetes Mother    Breast cancer Mother 80   Coronary artery disease Mother    Stroke Father    Coronary artery disease Father    Diabetes Son      Vitals:   01/27/24 2304 01/28/24 0459 01/28/24 0500 01/28/24 0806  BP: (!) 159/76 (!) 165/87  (!) 172/96  Pulse: (!) 59 (!) 59  (!) 58  Resp: 18 20  16   Temp: 97.9 F (36.6 C) 97.8 F (36.6 C)  (!) 97.4 F (36.3 C)  TempSrc:      SpO2: 93% 92%  96%  Weight:   71.4 kg   Height:        PHYSICAL EXAM General: Well-appearing elderly female, well nourished, in no acute distress. HEENT: Normocephalic and atraumatic. Neck: No JVD.  Lungs: Normal respiratory effort on room air.   Heart: HRRR. Normal S1 and S2 without gallops or murmurs.  Abdomen: Non-distended appearing.  Msk: Normal strength and tone for age. Extremities: Warm and well perfused. No clubbing, cyanosis.  Trace edema.  Neuro: Alert and oriented X 3. Psych: Answers questions appropriately.   Labs: Basic Metabolic Panel: Recent Labs    01/27/24 0532 01/28/24 0403  NA 135 135  K 4.3 4.6  CL 99 99  CO2 23 24  GLUCOSE 141* 138*  BUN 41* 36*  CREATININE 2.14* 1.97*  CALCIUM 8.7* 8.6*   Liver Function Tests: No results for input(s): AST, ALT, ALKPHOS, BILITOT, PROT, ALBUMIN in the last 72 hours.  No results for input(s): LIPASE, AMYLASE in the last 72 hours. CBC: Recent Labs    01/27/24 0532  WBC 6.5  HGB 13.5  HCT 41.9  MCV 87.7  PLT 227   Cardiac Enzymes: No results for input(s): CKTOTAL, CKMB, CKMBINDEX, TROPONINIHS in the last 72  hours.  BNP: No results for input(s): BNP in the last 72 hours.  D-Dimer: No results for input(s): DDIMER in the last 72 hours.  Hemoglobin A1C: No results for input(s): HGBA1C in the last 72 hours.  Fasting Lipid Panel: No results for input(s): CHOL, HDL, LDLCALC, TRIG, CHOLHDL, LDLDIRECT in the last 72 hours. Thyroid Function Tests: No results for input(s): TSH, T4TOTAL, T3FREE, THYROIDAB in the last 72 hours.  Invalid input(s): FREET3 Anemia Panel: No results for input(s): VITAMINB12, FOLATE, FERRITIN, TIBC, IRON, RETICCTPCT in the last 72 hours.   Radiology: US  Venous Img Lower Bilateral (DVT) Result Date: 01/24/2024 CLINICAL DATA:  Pulmonary embolism. EXAM: BILATERAL LOWER EXTREMITY VENOUS DOPPLER ULTRASOUND TECHNIQUE: Gray-scale sonography with compression, as well as color and duplex ultrasound, were performed to evaluate the deep venous system(s) from the level of the common femoral vein through the popliteal and proximal calf veins. COMPARISON:  None Available. FINDINGS: VENOUS Normal compressibility of the common femoral, superficial femoral, and popliteal veins, as well as the visualized calf veins. Visualized portions of profunda femoral vein and great saphenous vein unremarkable. No filling defects to suggest DVT on grayscale or color Doppler imaging. Doppler waveforms show normal direction of venous flow, normal respiratory plasticity and response to augmentation. Limited views of the contralateral common femoral vein are unremarkable. OTHER None. Limitations: none IMPRESSION: Negative. Electronically Signed   By: Leita Birmingham M.D.   On: 01/24/2024 14:19   CT Angio Chest PE W and/or Wo Contrast Addendum Date: 01/24/2024 ** ADDENDUM #1 ** ADDENDUM: The above findings were discussed with Dr. Levander at 9:53 am 01/24/24. ---------------------------------------------------- Electronically signed by: Evalene Coho MD 01/24/2024 10:49 AM EST RP  Workstation: [id number]   Result Date: 01/24/2024 ** ORIGINAL REPORT ** EXAM: CTA of the Chest with contrast for PE 01/24/2024 09:25:18 AM TECHNIQUE: CTA of the chest was performed without and with the administration of 75 mL of iohexol  (OMNIPAQUE ) 350 MG/ML injection. Multiplanar reformatted images are provided for review. MIP images are provided for review. Automated exposure control, iterative reconstruction, and/or weight based adjustment of the mA/kV was utilized to reduce the radiation dose to as low as reasonably achievable. COMPARISON: PA and lateral radiographs of the chest dated 01/24/2024. CLINICAL HISTORY: Pulmonary embolism (PE) suspected, low to intermediate prob, positive D-dimer. FINDINGS: PULMONARY ARTERIES: Pulmonary arteries are adequately opacified for evaluation. There is nonocclusive thromboembolic disease present within the distal right pulmonary artery and within the right lower lobe artery and anterior segmental branch of the right lower lobe pulmonary artery. There is also thromboembolus within the posterior segmental artery. There is no definite thromboembolic disease demonstrated in the left lung. Main pulmonary artery is normal in caliber. MEDIASTINUM: The heart is enlarged. There is no evidence of right ventricular strain. There is mild calcific coronary artery disease. The thoracic aorta demonstrates mild-to-moderate calcific atheromatous disease. The ascending thoracic aorta measures approximately 3.3 cm in diameter. LYMPH NODES: There are numerous enlarged mediastinal lymph nodes. There is also right hilar lymphadenopathy. No axillary lymphadenopathy. LUNGS AND PLEURA: There is mild dependent atelectasis within the right lung. There is a moderate right-sided pleural effusion. No pneumothorax. No focal consolidation or pulmonary edema. UPPER ABDOMEN: Limited images of the upper abdomen are unremarkable. SOFT TISSUES AND BONES: No acute bone or soft tissue abnormality. IMPRESSION: 1.  Nonocclusive pulmonary emboli involving the distal right pulmonary artery, right lower lobe artery, and anterior and posterior segmental branches; no left-sided PE identified. No CT evidence of right heart strain. 2. Moderate right pleural  effusion. 3. Cardiomegaly with mild coronary artery calcifications and mild-to-moderate thoracic aortic atherosclerosis. 4. Enlarged mediastinal and right hilar lymph nodes; recommend clinical correlation and follow-up per oncologic/infectious/inflammatory considerations. Electronically signed by: Evalene Coho MD 01/24/2024 09:51 AM EST RP Workstation: [id number]   DG Chest 2 View Result Date: 01/24/2024 CLINICAL DATA:  Shortness of breath. EXAM: CHEST - 2 VIEW COMPARISON:  08/08/2020 FINDINGS: Mild cardiomegaly, with dual lead pacemaker in place. Small to moderate right pleural effusion. Right basilar atelectasis versus infiltrate. IMPRESSION: Small to moderate right pleural effusion, with right basilar atelectasis versus infiltrate. Electronically Signed   By: Norleen DELENA Kil M.D.   On: 01/24/2024 08:29    ECHO 12/2023: SEVERE LEFT VENTRICULAR SYSTOLIC DYSFUNCTION WITH MILD LVH  ESTIMATED EF: 25%, CALC EF(2D): 25%  ELEVATED LA PRESSURES WITH DIASTOLIC DYSFUNCTION (GRADE 3)  NORMAL RIGHT VENTRICULAR SYSTOLIC FUNCTION  VALVULAR REGURGITATION: TRIVIAL AR, MILD MR, No PR, MODERATE TR                           ESTIMATED RVSP: 56 mmHg (ABNORMAL)  NO VALVULAR STENOSIS         TELEMETRY (personally reviewed): Ventricular pacing rate 60s  EKG (personally reviewed): VP rate 61 bpm  Data reviewed by me 01/28/2024: last 24h vitals tele labs imaging I/O ED provider note, admission H&P, hospitalist progress note  Principal Problem:   Acute on chronic systolic CHF (congestive heart failure) (HCC) Active Problems:   Acute respiratory failure with hypoxia (HCC)   Acute pulmonary embolism (HCC)   Pleural effusion on right   Acute kidney injury superimposed on CKD    Paroxysmal atrial fibrillation (HCC)   Uncontrolled type 2 diabetes mellitus with hyperglycemia, with long-term current use of insulin  (HCC)    ASSESSMENT AND PLAN:  Deborah Dawson is a 73 y.o. female  with a past medical history of chronic HFrEF, persistent atrial fibrillation, hypertension, type 2 diabetes who presented to the ED on 01/24/2024 for SOB and LE edema. Cardiology was consulted for further evaluation.   # Acute on chronic HFrEF # Pulmonary emboli # Persistent atrial fibrillation # S/p CRT-P # Hypertension Patient presented with worsening shortness of breath, lower extremity edema, cough.  Chest x-ray concerning for heart failure with elevated BNP at 2500.  CTA chest did reveal multiple right sided pulmonary emboli and she was started on IV heparin . S/p R thoracentesis 01/26/2024 with 900 mL removed. - Continue IV heparin .  Xarelto copay is $0 - plan to start prior to DC once renal function improved. - Hold spironolactone today given Cr still elevated. Increase carvedilol  to 25 mg twice daily for improved BP control, GDMT.  Previously has not tolerated Entresto due to cough, Farxiga due to UTI and yeast infections, ARBs. Also reported LE edema with amlodipine and refused to retry this. -Minimal and flat troponin most consistent with demand/supply mismatch and not ACS in the setting of above.   This patient's plan of care was discussed and created with Dr. Florencio and he is in agreement.  Signed: Danita Bloch, PA-C  01/28/2024, 8:44 AM [facility]

## 2024-01-28 NOTE — Plan of Care (Signed)

## 2024-01-28 NOTE — Progress Notes (Signed)
 Progress Note   Patient: Deborah Dawson FMW:[id number] DOB: August 24, 1950 DOA: 01/24/2024     4 DOS: the patient was seen and examined on 01/28/2024   Brief hospital course: 73 y.o. female with medical history significant of PAF on Eliquis, chronic HFrEF LVEF 25% with recurrent right-sided pleural effusion, PPM-ICD, HTN, IDDM, gout, presented with worsening of shortness of breath and leg swelling.  Symptoms started 3 days ago patient started to feel increasing exertional dyspnea, and also developed orthopnea, in addition she also noticed increasing leg swelling left more than right.    CTA showed nonocclusive pulmonary emboli improved distal right pulmonary artery right lower lobe artery and anterior and posterior segment branch no left-sided PE and no signs of right heart strain.   11/4.  900 mL drawn off with right-sided thoracentesis. 11/5.  Creatinine worse at 2.14 today.  Will give a fluid bolus.  250 cc.  Recheck creatinine tomorrow.  Spironolactone on hold. 11/6.  Creatinine 1.97 with a GFR of 26.  Continue to hold spironolactone.  Coreg  dose increased for high blood pressure.    Assessment and Plan: * Acute kidney injury superimposed on CKD AKI on CKD stage IIIb.  Creatinine 1.24 on presentation.  Creatinine peak 2.14.  Today's creatinine 1.97.  Acute on chronic systolic CHF (congestive heart failure) (HCC) Holding diuresis again today with impaired kidney function.  Received 250 mL liter fluid bolus on 11/5.  On Coreg .  Hold Aldactone.  Pleural effusion on right 900 mL of fluid drawn underneath the right lung on 11/4.  Will repeat chest x-ray tomorrow.  Acute pulmonary embolism (HCC) Currently on heparin  drip.  Was on Eliquis as outpatient.  Patient hesitant about Coumadin .  With her creatinine still elevated, I am hesitant on starting Xarelto today.  Acute respiratory failure with hypoxia [facility]) Documented by admitting physician with increased respiratory effort on presentation.   This problem has resolved and patient breathing comfortably on room air.  Uncontrolled type 2 diabetes mellitus with hyperglycemia, with long-term current use of insulin  (HCC) Patient sliding-scale insulin .  Continue Lantus  and sliding scale.  Hemoglobin A1c 8.0  Paroxysmal atrial fibrillation (HCC) EKG showed paced rhythm.        Subjective: Patient feeling okay.  Wanted to go home today.  Creatinine still elevated and not low enough to start Xarelto at this point.  Has a little bit of a cough.  Admitted with shortness of breath and found to be in heart failure and a right pleural effusion.  Physical Exam: Vitals:   01/28/24 0459 01/28/24 0500 01/28/24 0806 01/28/24 1120  BP: (!) 165/87  (!) 172/96 (!) 170/83  Pulse: (!) 59  (!) 58 64  Resp: 20  16 16   Temp: 97.8 F (36.6 C)  (!) 97.4 F (36.3 C) (!) 97.5 F (36.4 C)  TempSrc:      SpO2: 92%  96% 97%  Weight:  71.4 kg    Height:       Physical Exam HENT:     Head: Normocephalic.     Mouth/Throat:     Pharynx: No oropharyngeal exudate.  Eyes:     General: Lids are normal.     Conjunctiva/sclera: Conjunctivae normal.  Cardiovascular:     Rate and Rhythm: Normal rate and regular rhythm.     Heart sounds: Normal heart sounds, S1 normal and S2 normal.  Pulmonary:     Breath sounds: Examination of the right-lower field reveals decreased breath sounds. Decreased breath sounds present. No wheezing, rhonchi  or rales.  Abdominal:     Palpations: Abdomen is soft.     Tenderness: There is no abdominal tenderness.  Musculoskeletal:     Right lower leg: Swelling present.     Left lower leg: Swelling present.  Skin:    General: Skin is warm.     Findings: No rash.  Neurological:     Mental Status: She is alert and oriented to person, place, and time.     Data Reviewed: Creatinine 1.97 with a GFR of 26  Family Communication: Declined  Disposition: Status is: Inpatient Remains inpatient appropriate because: Unable to  switch over to Xarelto with GFR less than 30.  Planned Discharge Destination: Home    Time spent: 28 minutes  Author: Charlie Patterson, MD 01/28/2024 1:12 PM  For on call review [email or web address].

## 2024-01-29 ENCOUNTER — Inpatient Hospital Stay

## 2024-01-29 ENCOUNTER — Other Ambulatory Visit (HOSPITAL_COMMUNITY): Payer: Self-pay

## 2024-01-29 DIAGNOSIS — I5023 Acute on chronic systolic (congestive) heart failure: Secondary | ICD-10-CM | POA: Diagnosis not present

## 2024-01-29 DIAGNOSIS — N179 Acute kidney failure, unspecified: Secondary | ICD-10-CM | POA: Diagnosis not present

## 2024-01-29 DIAGNOSIS — J90 Pleural effusion, not elsewhere classified: Secondary | ICD-10-CM | POA: Diagnosis not present

## 2024-01-29 DIAGNOSIS — I2699 Other pulmonary embolism without acute cor pulmonale: Secondary | ICD-10-CM | POA: Diagnosis not present

## 2024-01-29 LAB — BODY FLUID CELL COUNT WITH DIFFERENTIAL
Eos, Fluid: 0 %
Lymphs, Fluid: 84 %
Monocyte-Macrophage-Serous Fluid: 7 %
Neutrophil Count, Fluid: 9 %
Total Nucleated Cell Count, Fluid: 405 uL

## 2024-01-29 LAB — BASIC METABOLIC PANEL WITH GFR
Anion gap: 11 (ref 5–15)
Anion gap: 11 (ref 5–15)
BUN: 38 mg/dL — ABNORMAL HIGH (ref 8–23)
BUN: 36 mg/dL — ABNORMAL HIGH (ref 8–23)
CO2: 22 mmol/L (ref 22–32)
CO2: 26 mmol/L (ref 22–32)
Calcium: 8.3 mg/dL — ABNORMAL LOW (ref 8.9–10.3)
Calcium: 8.6 mg/dL — ABNORMAL LOW (ref 8.9–10.3)
Chloride: 105 mmol/L (ref 98–111)
Chloride: 101 mmol/L (ref 98–111)
Creatinine, Ser: 1.8 mg/dL — ABNORMAL HIGH (ref 0.44–1.00)
Creatinine, Ser: 1.84 mg/dL — ABNORMAL HIGH (ref 0.44–1.00)
GFR, Estimated: 29 mL/min — ABNORMAL LOW (ref >60)
GFR, Estimated: 28 mL/min — ABNORMAL LOW (ref >60)
Glucose, Bld: 135 mg/dL — ABNORMAL HIGH (ref 70–99)
Glucose, Bld: 208 mg/dL — ABNORMAL HIGH (ref 70–99)
Potassium: 4.3 mmol/L (ref 3.5–5.1)
Potassium: 4.4 mmol/L (ref 3.5–5.1)
Sodium: 138 mmol/L (ref 135–145)
Sodium: 138 mmol/L (ref 135–145)

## 2024-01-29 LAB — GLUCOSE, PLEURAL OR PERITONEAL FLUID: Glucose, Fluid: 178 mg/dL

## 2024-01-29 LAB — PROTEIN, PLEURAL OR PERITONEAL FLUID: Total protein, fluid: <3 g/dL

## 2024-01-29 LAB — GLUCOSE, CAPILLARY
Glucose-Capillary: 112 mg/dL — ABNORMAL HIGH (ref 70–99)
Glucose-Capillary: 210 mg/dL — ABNORMAL HIGH (ref 70–99)
Glucose-Capillary: 221 mg/dL — ABNORMAL HIGH (ref 70–99)

## 2024-01-29 LAB — HEPARIN LEVEL (UNFRACTIONATED)
Heparin Unfractionated: 0.27 [IU]/mL — ABNORMAL LOW (ref 0.30–0.70)
Heparin Unfractionated: 0.35 [IU]/mL (ref 0.30–0.70)
Heparin Unfractionated: 0.22 [IU]/mL — ABNORMAL LOW (ref 0.30–0.70)

## 2024-01-29 MED ORDER — LIDOCAINE HCL (PF) 1 % IJ SOLN
10.0000 mL | Freq: Once | INTRAMUSCULAR | Status: AC
  Administered 2024-01-29: 10 mL via INTRADERMAL

## 2024-01-29 MED ORDER — HEPARIN BOLUS VIA INFUSION
950.0000 [IU] | Freq: Once | INTRAVENOUS | Status: AC
  Administered 2024-01-29: 950 [IU] via INTRAVENOUS
  Filled 2024-01-29: qty 950

## 2024-01-29 MED ORDER — HEPARIN BOLUS VIA INFUSION
900.0000 [IU] | Freq: Once | INTRAVENOUS | Status: AC
  Administered 2024-01-30: 900 [IU] via INTRAVENOUS
  Filled 2024-01-29: qty 900

## 2024-01-29 NOTE — Progress Notes (Signed)
 PHARMACY - ANTICOAGULATION CONSULT NOTE  Pharmacy Consult for Heparin  Indication: pulmonary embolus  Allergies  Allergen Reactions   Doxazosin Other (See Comments)    Cardura - cough   Doxycycline      Abdominal pain   Drug Class [Clindamycin/Lincomycin] Hives   Hydralazine Hcl Other (See Comments)    gout   Metformin  And Related Swelling   Ciprofloxacin Other (See Comments)    Numbness in face   Iodine Other (See Comments)   Pioglitazone Other (See Comments)   Sacubitril-Valsartan Cough    Pt states that it gave her a cough and chest palpitations   Semaglutide Nausea Only   Sulfamethoxazole-Trimethoprim Hives   Augmentin [Amoxicillin-Pot Clavulanate] Diarrhea   Dulaglutide Nausea Only   Hctz [Hydrochlorothiazide] Other (See Comments)    Gout   Losartan Diarrhea   Poison Oak Extract Rash   Red Dye #40 (Allura Red) Rash    Patient Measurements: Height: 5' 1 (154.9 cm) Weight: 71.4 kg (157 lb 6.5 oz) IBW/kg (Calculated) : 47.8 HEPARIN  DW (KG): 62.6  Vital Signs: Temp: 97.7 F (36.5 C) (11/06 2342) BP: 156/75 (11/06 2342) Pulse Rate: 59 (11/06 2342)  Labs: Recent Labs    01/27/24 0532 01/28/24 0403 01/29/24 0355  HGB 13.5  --   --   HCT 41.9  --   --   PLT 227  --   --   APTT 69*  --   --   HEPARINUNFRC 0.62 0.31 0.27*  CREATININE 2.14* 1.97* 1.80*    Estimated Creatinine Clearance: 24.4 mL/min (A) (by C-G formula based on SCr of 1.8 mg/dL (H)).   Medical History: Past Medical History:  Diagnosis Date   Allergic genetic state    Atrial fibrillation (HCC) 2010   Cardiomyopathy (HCC)    CHF (congestive heart failure) (HCC)    Diabetes mellitus without complication (HCC)    Dysrhythmia    Gout    H/O cardiac catheterization    Hypertension    Joint pain    Osteopenia    Presence of permanent cardiac pacemaker    Psoriasis     Medications:  Medications Prior to Admission  Medication Sig Dispense Refill Last Dose/Taking   apixaban (ELIQUIS) 5  MG TABS tablet Take 5 mg by mouth 2 (two) times daily.   01/24/2024   carvedilol  (COREG ) 3.125 MG tablet Take 1 tablet (3.125 mg total) by mouth 2 (two) times daily with a meal. 60 tablet 1 01/24/2024   digoxin  (LANOXIN ) 0.25 MG tablet Take 1 tablet (0.25 mg total) by mouth daily. 30 tablet 0 01/24/2024   furosemide  (LASIX ) 20 MG tablet Take 10 mg by mouth daily.   01/24/2024   gabapentin (NEURONTIN) 100 MG capsule Take 100 mg by mouth 2 (two) times daily.   01/24/2024   insulin  glargine (LANTUS  SOLOSTAR) 100 UNIT/ML Solostar Pen Inject 15 Units into the skin at bedtime. (Patient taking differently: Inject 20 Units into the skin at bedtime.) 4.5 mL 11 01/23/2024   Magnesium  Gluconate 550 MG TABS Take by mouth.   Taking   Vitamin D, Ergocalciferol, 2000 units CAPS Take 1 capsule by mouth daily.   Taking   acidophilus (RISAQUAD) CAPS capsule Take 2 capsules by mouth 3 (three) times daily. (Patient not taking: Reported on 03/04/2023) 30 capsule 0 Not Taking   albuterol  (VENTOLIN  HFA) 108 (90 Base) MCG/ACT inhaler Inhale 2 puffs into the lungs every 4 (four) hours as needed for shortness of breath. (Patient not taking: Reported on 03/04/2023)   Not Taking  amLODipine (NORVASC) 2.5 MG tablet Take 2.5 mg by mouth daily. (Patient not taking: Reported on 01/24/2024)   Not Taking   Calcium Carb-Cholecalciferol (CALCIUM 500+D PO) Take 1 tablet by mouth daily. (Patient not taking: Reported on 01/24/2024)   Not Taking   cloNIDine  (CATAPRES ) 0.2 MG tablet Take 0.2 mg by mouth 3 (three) times daily. (Patient not taking: Reported on 03/04/2023)   Not Taking   colchicine 0.6 MG tablet Take 0.6-1.8 mg by mouth as directed. (Patient not taking: Reported on 03/04/2023)   Not Taking   dexamethasone  (DECADRON ) 6 MG tablet Take 1 tablet (6 mg total) by mouth daily. (Patient not taking: Reported on 03/04/2023) 5 tablet 0 Not Taking   diltiazem  (TIAZAC ) 120 MG 24 hr capsule Take 120 mg by mouth daily. (Patient not taking: Reported  on 03/04/2023)   Not Taking   fluticasone (FLONASE) 50 MCG/ACT nasal spray Place 1 spray into both nostrils 2 (two) times daily. (Patient not taking: Reported on 01/24/2024)   Not Taking   glimepiride  (AMARYL ) 4 MG tablet Take 4 mg by mouth 2 (two) times daily. (Patient not taking: Reported on 03/04/2023)   Not Taking   guaiFENesin -codeine 100-10 MG/5ML syrup Take 5 mLs by mouth every 4 (four) hours as needed for cough. (Patient not taking: Reported on 03/04/2023)   Not Taking   insulin  lispro (HUMALOG) 100 UNIT/ML injection Inject 12 Units into the skin 3 (three) times daily before meals. (Patient not taking: Reported on 01/24/2024)   Not Taking   Multiple Vitamin (MULTIVITAMIN WITH MINERALS) TABS tablet Take 1 tablet by mouth daily. (Patient not taking: Reported on 01/24/2024) 30 tablet 0 Not Taking   nystatin cream (MYCOSTATIN) Apply 1 Application topically 2 (two) times daily. (Patient not taking: Reported on 01/24/2024)   Not Taking   Semaglutide,0.25 or 0.5MG /DOS, (OZEMPIC, 0.25 OR 0.5 MG/DOSE,) 2 MG/1.5ML SOPN Inject into the skin. (Patient not taking: Reported on 03/04/2023)   Not Taking   spironolactone (ALDACTONE) 25 MG tablet Take 12.5 mg by mouth daily. (Patient not taking: Reported on 01/24/2024)   Not Taking   Turmeric 500 MG CAPS Take 1 capsule by mouth daily. (Patient not taking: Reported on 01/24/2024)   Not Taking   warfarin (COUMADIN ) 2 MG tablet Take 1 tablet (2 mg total) by mouth daily at 4 PM. (Patient not taking: Reported on 03/04/2023) 30 tablet 1 Not Taking   Scheduled:   carvedilol   12.5 mg Oral BID WC   gabapentin  100 mg Oral BID   heparin   950 Units Intravenous Once   insulin  aspart  0-9 Units Subcutaneous TID WC   insulin  glargine-yfgn  15 Units Subcutaneous QHS   isosorbide mononitrate  30 mg Oral Daily   sodium chloride  flush  3 mL Intravenous Q12H   spironolactone  12.5 mg Oral Daily   Infusions:   heparin  1,000 Units/hr (01/28/24 1805)   PRN:  Anti-infectives  (From admission, onward)    None       Assessment: Patient is a 73 yo F presenting with SOB and an ongoing cough that has been present for several months. PMH significant for nonischemic cardiomyopathy, A-fib (on Eliquis), hypertension, T2DM, bradycardia resulted with s/p pacemaker. Pharmacy is consulted for heparin  dosing and monitoring. Last dose of Eliquis is unknown.  11/2: CT chest remarkable for nonocclusive pulmonary emboli involving the distal right pulmonary artery, right lower lobe artery, and anterior and posterior segmental branches.   Baseline CBC is stable. Baseline aPTT, INR, and HL ordered  as add on labs.    Goal of Therapy:  Heparin  level 0.3-0.7 units/ml aPTT 66-102 seconds Monitor platelets by anticoagulation protocol: Yes   Date Time aPTT Rate/Comment  11/2 2040 108 Supratherapeutic @1000  units/hr 11/3 0621 87 Therapeutic x1 @900  units/hr 11/3 1605 86 Therapeutic x2 @900  units/hour 11/4     0343    77        Therapeutic x3 @ 900 units/hr  11/5     0532    69        Therapeutic x4 @ 900 units/hr  11/6:  HL @ 0532 = 0.31, therapeutic (low end, trending down) 11/7:  HL @ 0355 = 0.27, SUBtherapeutic   Plan:  11/7:  HL @ 0355 = 0.27, SUBtherapeutic  - Will order heparin  950 units IV X 1 bolus and increase drip rate to 1100 units/hr - Recheck HL 8 hrs after rate change  - CBC daily  Thank you for involving pharmacy in this patient's care.   Silver Selinda BIRCH, PharmD Clinical Pharmacist 01/29/2024 4:45 AM

## 2024-01-29 NOTE — Progress Notes (Signed)
 Heart Failure Stewardship Pharmacy Note  PCP: Sadie Manna, MD PCP-Cardiologist: None  HPI: Deborah Dawson is a 73 y.o. female with chronic HFrEF, s/p CRT-P 07/2021, persistent atrial fibrillation, hypertension, type 2 diabetes who presented with shortness of breath. On admission, BNP was 2499, HS-troponin was 31, and digoxin  level was 2.3. Chest x-ray noted small to moderate right pleural effusion.  CTA 01/24/24 noted nonocclusive pulmonary emboli involving the distal right pulmonary artery. Right lower lobe artery, and anterior/posterior segmental branches with no evidence of RHS. IR drained 900 cc from pleural space 01/26/24.  Pertinent cardiac history: TTE 06/2015 with LVEF of 20-25%. 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 TTE 07/2020 with LVEF improved ot 30-35% with mild RV dysfunction, mild MR, mild to moderate TR. In 07/2021 device upgraded to CRT-D with LBBB and dyssynchrony. TTE 12/2023 noted LVEF down again to 25% with G3DD, trivial AR, mild MR, moderate TR.  Pertinent Lab Values: Creatinine, Ser  Date Value Ref Range Status  01/29/2024 1.80 (H) 0.44 - 1.00 mg/dL Final   BUN  Date Value Ref Range Status  01/29/2024 38 (H) 8 - 23 mg/dL Final   Potassium  Date Value Ref Range Status  01/29/2024 4.3 3.5 - 5.1 mmol/L Final   Sodium  Date Value Ref Range Status  01/29/2024 138 135 - 145 mmol/L Final   B Natriuretic Peptide  Date Value Ref Range Status  01/24/2024 2,499.0 (H) 0.0 - 100.0 pg/mL Final    Comment:    Performed at [facility], [address] [place], [place] [address]   Magnesium   Date Value Ref Range Status  08/10/2020 2.0 1.7 - 2.4 mg/dL Final    Comment:    Performed at [facility], [address] [place], [place] [address]   Hgb A1c MFr Bld  Date Value Ref Range Status  01/24/2024 8.0 (H) 4.8 - 5.6 % Final    Comment:    (NOTE) Diagnosis of Diabetes The following HbA1c ranges recommended by the American Diabetes  Association (ADA) may be used as an aid in the diagnosis of diabetes mellitus.  Hemoglobin             Suggested A1C NGSP%              Diagnosis  <5.7                   Non Diabetic  5.7-6.4                Pre-Diabetic  >6.4                   Diabetic  <7.0                   Glycemic control for                       adults with diabetes.     Digoxin  Level  Date Value Ref Range Status  01/27/2024 1.6 0.8 - 2.0 ng/mL Final    Comment:    Performed at [facility], [address] [place], [place] [address]   TSH  Date Value Ref Range Status  06/25/2015 0.661 0.350 - 4.500 uIU/mL Final   LDH  Date Value Ref Range Status  08/08/2020 149 98 - 192 U/L Final    Comment:    Performed at [facility], [address], [place], [place] [address]    Vital Signs: Temp:  [97.5  F (36.4 C)-98 F (36.7 C)] 98 F (36.7 C) (11/07 0831) Pulse Rate:  [53-64] 63 (11/07 0831) Cardiac Rhythm: Ventricular paced (11/07 0816) Resp:  [16-20] 18 (11/07 0534) BP: (154-170)/(72-92) 158/75 (11/07 0831) SpO2:  [93 %-97 %] 97 % (11/07 0831) Weight:  [70.2 kg (154 lb 12.2 oz)] 70.2 kg (154 lb 12.2 oz) (11/07 0500)  Intake/Output Summary (Last 24 hours) at 01/29/2024 0910 Last data filed at 01/28/2024 1130 Gross per 24 hour  Intake 240 ml  Output --  Net 240 ml   Current Heart Failure Medications:  Loop diuretic: none Beta-Blocker: carvedilol  12.5 mg BID ACEI/ARB/ARNI: none MRA: spironolactone 12.5 mg daily SGLT2i:none  Other: digoxin  0.125 mg daily  Prior to admission Heart Failure Medications:  Loop diuretic: furosemide  10 mg daily Beta-Blocker: carvedilol  3.125 mg BID ACEI/ARB/ARNI:  none MRA: none SGLT2i: none Other: digoxin  0.25 mg daily  Assessment: 1. 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 NYHA class III symptoms. Patient's cardiomyopathy is in part due to previous LBBB s/p CRT-P, however, she  also appears on most recent echo to have restrictive cardiomyopathy as well. -Symptoms: Patient reports shortness of breath has improved some after thoracentesis. Sleep quality improved. -Volume: Does not appear overly volume overloaded on exam. Creatinine is trending down even with spironolactone restart. -Hemodynamics: BP is remains elevated. HR 60s.  -BB: Continue carvedilol  12.5 mg BID. Titration limited by HR. -ACEI/ARB/ARNI: Unable to add at this time given eGFR.  -MRA: Continue spironolactone 12.5 mg daily at this time.  -SGLT2i: Reportedly has a history of UTI. May not be ideal at this time.  -Digoxin  level was high on admission. The dose was reduced and repeat digoxin  level was lower but still high at 1.6. Digoxin  held.  -Patient noted gout reaction to hydralazine, which is not commonly a noted ADE. Patient refuses to attempt amlodipine.  Plan: 1) Medication changes recommended at this time: - None. Patient refused safe renal options for BP lowering.  2) Patient assistance: -Pending  3) Education: - Patient has been educated on current HF medications and potential additions to HF medication regimen - Patient verbalizes understanding that over the next few months, these medication doses may change and more medications may be added to optimize HF regimen - Patient has been educated on basic disease state pathophysiology and goals of therapy  Medication Assistance / Insurance Benefits Check: Does the patient have prescription insurance?    Please do not hesitate to reach out with questions or concerns,  Jaun Bash, PharmD, CPP, BCPS, [facility] Heart Failure Pharmacist  Phone - [phone] 01/29/2024 9:10 AM

## 2024-01-29 NOTE — Progress Notes (Signed)
 Pt refused bed alarm but was educated about its importance. Pt verbalized understanding. Will notify incoming shift.

## 2024-01-29 NOTE — Progress Notes (Signed)
 PHARMACY - ANTICOAGULATION CONSULT NOTE  Pharmacy Consult for Heparin  Indication: pulmonary embolus  Allergies  Allergen Reactions   Doxazosin Other (See Comments)    Cardura - cough   Doxycycline      Abdominal pain   Drug Class [Clindamycin/Lincomycin] Hives   Hydralazine Hcl Other (See Comments)    gout   Metformin  And Related Swelling   Ciprofloxacin Other (See Comments)    Numbness in face   Iodine Other (See Comments)   Pioglitazone Other (See Comments)   Sacubitril-Valsartan Cough    Pt states that it gave her a cough and chest palpitations   Semaglutide Nausea Only   Sulfamethoxazole-Trimethoprim Hives   Augmentin [Amoxicillin-Pot Clavulanate] Diarrhea   Dulaglutide Nausea Only   Hctz [Hydrochlorothiazide] Other (See Comments)    Gout   Losartan Diarrhea   Poison Oak Extract Rash   Red Dye #40 (Allura Red) Rash    Patient Measurements: Height: 5' 1 (154.9 cm) Weight: 70.2 kg (154 lb 12.2 oz) IBW/kg (Calculated) : 47.8 HEPARIN  DW (KG): 62.6  Vital Signs: Temp: 97.6 F (36.4 C) (11/07 1203) Temp Source: Oral (11/07 1203) BP: 165/74 (11/07 1203) Pulse Rate: 61 (11/07 1203)  Labs: Recent Labs    01/27/24 0532 01/28/24 0403 01/29/24 0355 01/29/24 1256  HGB 13.5  --   --   --   HCT 41.9  --   --   --   PLT 227  --   --   --   APTT 69*  --   --   --   HEPARINUNFRC 0.62 0.31 0.27* 0.35  CREATININE 2.14* 1.97* 1.80* 1.84*    Estimated Creatinine Clearance: 23.7 mL/min (A) (by C-G formula based on SCr of 1.84 mg/dL (H)).   Medical History: Past Medical History:  Diagnosis Date   Allergic genetic state    Atrial fibrillation (HCC) 2010   Cardiomyopathy (HCC)    CHF (congestive heart failure) (HCC)    Diabetes mellitus without complication (HCC)    Dysrhythmia    Gout    H/O cardiac catheterization    Hypertension    Joint pain    Osteopenia    Presence of permanent cardiac pacemaker    Psoriasis     Medications:  Medications Prior to  Admission  Medication Sig Dispense Refill Last Dose/Taking   apixaban (ELIQUIS) 5 MG TABS tablet Take 5 mg by mouth 2 (two) times daily.   01/24/2024   carvedilol  (COREG ) 3.125 MG tablet Take 1 tablet (3.125 mg total) by mouth 2 (two) times daily with a meal. 60 tablet 1 01/24/2024   digoxin  (LANOXIN ) 0.25 MG tablet Take 1 tablet (0.25 mg total) by mouth daily. 30 tablet 0 01/24/2024   furosemide  (LASIX ) 20 MG tablet Take 10 mg by mouth daily.   01/24/2024   gabapentin (NEURONTIN) 100 MG capsule Take 100 mg by mouth 2 (two) times daily.   01/24/2024   insulin  glargine (LANTUS  SOLOSTAR) 100 UNIT/ML Solostar Pen Inject 15 Units into the skin at bedtime. (Patient taking differently: Inject 20 Units into the skin at bedtime.) 4.5 mL 11 01/23/2024   Magnesium  Gluconate 550 MG TABS Take by mouth.   Taking   Vitamin D, Ergocalciferol, 2000 units CAPS Take 1 capsule by mouth daily.   Taking   acidophilus (RISAQUAD) CAPS capsule Take 2 capsules by mouth 3 (three) times daily. (Patient not taking: Reported on 03/04/2023) 30 capsule 0 Not Taking   albuterol  (VENTOLIN  HFA) 108 (90 Base) MCG/ACT inhaler Inhale 2 puffs into the  lungs every 4 (four) hours as needed for shortness of breath. (Patient not taking: Reported on 03/04/2023)   Not Taking   amLODipine (NORVASC) 2.5 MG tablet Take 2.5 mg by mouth daily. (Patient not taking: Reported on 01/24/2024)   Not Taking   Calcium Carb-Cholecalciferol (CALCIUM 500+D PO) Take 1 tablet by mouth daily. (Patient not taking: Reported on 01/24/2024)   Not Taking   cloNIDine  (CATAPRES ) 0.2 MG tablet Take 0.2 mg by mouth 3 (three) times daily. (Patient not taking: Reported on 03/04/2023)   Not Taking   colchicine 0.6 MG tablet Take 0.6-1.8 mg by mouth as directed. (Patient not taking: Reported on 03/04/2023)   Not Taking   dexamethasone  (DECADRON ) 6 MG tablet Take 1 tablet (6 mg total) by mouth daily. (Patient not taking: Reported on 03/04/2023) 5 tablet 0 Not Taking   diltiazem   (TIAZAC ) 120 MG 24 hr capsule Take 120 mg by mouth daily. (Patient not taking: Reported on 03/04/2023)   Not Taking   fluticasone (FLONASE) 50 MCG/ACT nasal spray Place 1 spray into both nostrils 2 (two) times daily. (Patient not taking: Reported on 01/24/2024)   Not Taking   glimepiride  (AMARYL ) 4 MG tablet Take 4 mg by mouth 2 (two) times daily. (Patient not taking: Reported on 03/04/2023)   Not Taking   guaiFENesin -codeine 100-10 MG/5ML syrup Take 5 mLs by mouth every 4 (four) hours as needed for cough. (Patient not taking: Reported on 03/04/2023)   Not Taking   insulin  lispro (HUMALOG) 100 UNIT/ML injection Inject 12 Units into the skin 3 (three) times daily before meals. (Patient not taking: Reported on 01/24/2024)   Not Taking   Multiple Vitamin (MULTIVITAMIN WITH MINERALS) TABS tablet Take 1 tablet by mouth daily. (Patient not taking: Reported on 01/24/2024) 30 tablet 0 Not Taking   nystatin cream (MYCOSTATIN) Apply 1 Application topically 2 (two) times daily. (Patient not taking: Reported on 01/24/2024)   Not Taking   Semaglutide,0.25 or 0.5MG /DOS, (OZEMPIC, 0.25 OR 0.5 MG/DOSE,) 2 MG/1.5ML SOPN Inject into the skin. (Patient not taking: Reported on 03/04/2023)   Not Taking   spironolactone (ALDACTONE) 25 MG tablet Take 12.5 mg by mouth daily. (Patient not taking: Reported on 01/24/2024)   Not Taking   Turmeric 500 MG CAPS Take 1 capsule by mouth daily. (Patient not taking: Reported on 01/24/2024)   Not Taking   warfarin (COUMADIN ) 2 MG tablet Take 1 tablet (2 mg total) by mouth daily at 4 PM. (Patient not taking: Reported on 03/04/2023) 30 tablet 1 Not Taking   Scheduled:   carvedilol   12.5 mg Oral BID WC   gabapentin  100 mg Oral BID   insulin  aspart  0-9 Units Subcutaneous TID WC   insulin  glargine-yfgn  15 Units Subcutaneous QHS   isosorbide mononitrate  30 mg Oral Daily   sodium chloride  flush  3 mL Intravenous Q12H   spironolactone  12.5 mg Oral Daily   Infusions:   heparin  1,100  Units/hr (01/29/24 0458)   PRN:  Anti-infectives (From admission, onward)    None       Assessment: Patient is a 73 yo F presenting with SOB and an ongoing cough that has been present for several months. PMH significant for nonischemic cardiomyopathy, A-fib (on Eliquis), hypertension, T2DM, bradycardia resulted with s/p pacemaker. Pharmacy is consulted for heparin  dosing and monitoring. Last dose of Eliquis is unknown.  11/2: CT chest remarkable for nonocclusive pulmonary emboli involving the distal right pulmonary artery, right lower lobe artery, and anterior and  posterior segmental branches.   Baseline CBC is stable. Baseline aPTT, INR, and HL ordered as add on labs.    Goal of Therapy:  Heparin  level 0.3-0.7 units/ml aPTT 66-102 seconds Monitor platelets by anticoagulation protocol: Yes   Date Time aPTT Rate/Comment  11/2 2040 108 Supratherapeutic @1000  units/hr 11/3 0621 87 Therapeutic x1 @900  units/hr 11/3 1605 86 Therapeutic x2 @900  units/hour 11/4     0343    77        Therapeutic x3 @ 900 units/hr  11/5     0532    69        Therapeutic x4 @ 900 units/hr  11/6:  HL @ 0532 = 0.31, therapeutic (low end, trending down) 11/7:  HL @ 0355 = 0.27, SUBtherapeutic 11/7 1256 HL 0.35.    Plan:  Heparin  level is therapeutic. Will continue heparin  infusion at 1100 units/hr. Recheck heparin  level in 8 hours. CBC daily while on heparin .   Thank you for involving pharmacy in this patient's care.   Cathaleen GORMAN Blanch, PharmD Clinical Pharmacist 01/29/2024 1:34 PM

## 2024-01-29 NOTE — Progress Notes (Signed)
 PHARMACY - ANTICOAGULATION CONSULT NOTE  Pharmacy Consult for Heparin  Indication: pulmonary embolus  Allergies  Allergen Reactions   Doxazosin Other (See Comments)    Cardura - cough   Doxycycline      Abdominal pain   Drug Class [Clindamycin/Lincomycin] Hives   Hydralazine Hcl Other (See Comments)    gout   Metformin  And Related Swelling   Ciprofloxacin Other (See Comments)    Numbness in face   Iodine Other (See Comments)   Pioglitazone Other (See Comments)   Sacubitril-Valsartan Cough    Pt states that it gave her a cough and chest palpitations   Semaglutide Nausea Only   Sulfamethoxazole-Trimethoprim Hives   Augmentin [Amoxicillin-Pot Clavulanate] Diarrhea   Dulaglutide Nausea Only   Hctz [Hydrochlorothiazide] Other (See Comments)    Gout   Losartan Diarrhea   Poison Oak Extract Rash   Red Dye #40 (Allura Red) Rash    Patient Measurements: Height: 5' 1 (154.9 cm) Weight: 70.2 kg (154 lb 12.2 oz) IBW/kg (Calculated) : 47.8 HEPARIN  DW (KG): 62.6  Vital Signs: Temp: 98.3 F (36.8 C) (11/07 2042) Temp Source: Oral (11/07 2042) BP: 157/70 (11/07 2042) Pulse Rate: 60 (11/07 1505)  Labs: Recent Labs    01/27/24 0532 01/28/24 0403 01/29/24 0355 01/29/24 1256 01/29/24 2022  HGB 13.5  --   --   --   --   HCT 41.9  --   --   --   --   PLT 227  --   --   --   --   APTT 69*  --   --   --   --   HEPARINUNFRC 0.62 0.31 0.27* 0.35 0.22*  CREATININE 2.14* 1.97* 1.80* 1.84*  --     Estimated Creatinine Clearance: 23.7 mL/min (A) (by C-G formula based on SCr of 1.84 mg/dL (H)).   Medical History: Past Medical History:  Diagnosis Date   Allergic genetic state    Atrial fibrillation (HCC) 2010   Cardiomyopathy (HCC)    CHF (congestive heart failure) (HCC)    Diabetes mellitus without complication (HCC)    Dysrhythmia    Gout    H/O cardiac catheterization    Hypertension    Joint pain    Osteopenia    Presence of permanent cardiac pacemaker    Psoriasis      Medications:  Medications Prior to Admission  Medication Sig Dispense Refill Last Dose/Taking   apixaban (ELIQUIS) 5 MG TABS tablet Take 5 mg by mouth 2 (two) times daily.   01/24/2024   carvedilol  (COREG ) 3.125 MG tablet Take 1 tablet (3.125 mg total) by mouth 2 (two) times daily with a meal. 60 tablet 1 01/24/2024   digoxin  (LANOXIN ) 0.25 MG tablet Take 1 tablet (0.25 mg total) by mouth daily. 30 tablet 0 01/24/2024   furosemide  (LASIX ) 20 MG tablet Take 10 mg by mouth daily.   01/24/2024   gabapentin (NEURONTIN) 100 MG capsule Take 100 mg by mouth 2 (two) times daily.   01/24/2024   insulin  glargine (LANTUS  SOLOSTAR) 100 UNIT/ML Solostar Pen Inject 15 Units into the skin at bedtime. (Patient taking differently: Inject 20 Units into the skin at bedtime.) 4.5 mL 11 01/23/2024   Magnesium  Gluconate 550 MG TABS Take by mouth.   Taking   Vitamin D, Ergocalciferol, 2000 units CAPS Take 1 capsule by mouth daily.   Taking   acidophilus (RISAQUAD) CAPS capsule Take 2 capsules by mouth 3 (three) times daily. (Patient not taking: Reported on 03/04/2023) 30 capsule  0 Not Taking   albuterol  (VENTOLIN  HFA) 108 (90 Base) MCG/ACT inhaler Inhale 2 puffs into the lungs every 4 (four) hours as needed for shortness of breath. (Patient not taking: Reported on 03/04/2023)   Not Taking   amLODipine (NORVASC) 2.5 MG tablet Take 2.5 mg by mouth daily. (Patient not taking: Reported on 01/24/2024)   Not Taking   Calcium Carb-Cholecalciferol (CALCIUM 500+D PO) Take 1 tablet by mouth daily. (Patient not taking: Reported on 01/24/2024)   Not Taking   cloNIDine  (CATAPRES ) 0.2 MG tablet Take 0.2 mg by mouth 3 (three) times daily. (Patient not taking: Reported on 03/04/2023)   Not Taking   colchicine 0.6 MG tablet Take 0.6-1.8 mg by mouth as directed. (Patient not taking: Reported on 03/04/2023)   Not Taking   dexamethasone  (DECADRON ) 6 MG tablet Take 1 tablet (6 mg total) by mouth daily. (Patient not taking: Reported on  03/04/2023) 5 tablet 0 Not Taking   diltiazem  (TIAZAC ) 120 MG 24 hr capsule Take 120 mg by mouth daily. (Patient not taking: Reported on 03/04/2023)   Not Taking   fluticasone (FLONASE) 50 MCG/ACT nasal spray Place 1 spray into both nostrils 2 (two) times daily. (Patient not taking: Reported on 01/24/2024)   Not Taking   glimepiride  (AMARYL ) 4 MG tablet Take 4 mg by mouth 2 (two) times daily. (Patient not taking: Reported on 03/04/2023)   Not Taking   guaiFENesin -codeine 100-10 MG/5ML syrup Take 5 mLs by mouth every 4 (four) hours as needed for cough. (Patient not taking: Reported on 03/04/2023)   Not Taking   insulin  lispro (HUMALOG) 100 UNIT/ML injection Inject 12 Units into the skin 3 (three) times daily before meals. (Patient not taking: Reported on 01/24/2024)   Not Taking   Multiple Vitamin (MULTIVITAMIN WITH MINERALS) TABS tablet Take 1 tablet by mouth daily. (Patient not taking: Reported on 01/24/2024) 30 tablet 0 Not Taking   nystatin cream (MYCOSTATIN) Apply 1 Application topically 2 (two) times daily. (Patient not taking: Reported on 01/24/2024)   Not Taking   Semaglutide,0.25 or 0.5MG /DOS, (OZEMPIC, 0.25 OR 0.5 MG/DOSE,) 2 MG/1.5ML SOPN Inject into the skin. (Patient not taking: Reported on 03/04/2023)   Not Taking   spironolactone (ALDACTONE) 25 MG tablet Take 12.5 mg by mouth daily. (Patient not taking: Reported on 01/24/2024)   Not Taking   Turmeric 500 MG CAPS Take 1 capsule by mouth daily. (Patient not taking: Reported on 01/24/2024)   Not Taking   warfarin (COUMADIN ) 2 MG tablet Take 1 tablet (2 mg total) by mouth daily at 4 PM. (Patient not taking: Reported on 03/04/2023) 30 tablet 1 Not Taking   Scheduled:   carvedilol   12.5 mg Oral BID WC   gabapentin  100 mg Oral BID   insulin  aspart  0-9 Units Subcutaneous TID WC   insulin  glargine-yfgn  15 Units Subcutaneous QHS   isosorbide mononitrate  30 mg Oral Daily   sodium chloride  flush  3 mL Intravenous Q12H   spironolactone  12.5 mg  Oral Daily   Infusions:   heparin  1,100 Units/hr (01/29/24 1641)   PRN:  Anti-infectives (From admission, onward)    None       Assessment: Patient is a 73 yo F presenting with SOB and an ongoing cough that has been present for several months. PMH significant for nonischemic cardiomyopathy, A-fib (on Eliquis), hypertension, T2DM, bradycardia resulted with s/p pacemaker. Pharmacy is consulted for heparin  dosing and monitoring. Last dose of Eliquis is unknown.  11/2: CT chest  remarkable for nonocclusive pulmonary emboli involving the distal right pulmonary artery, right lower lobe artery, and anterior and posterior segmental branches.   Baseline CBC is stable. Baseline aPTT, INR, and HL ordered as add on labs.    Goal of Therapy:  Heparin  level 0.3-0.7 units/ml aPTT 66-102 seconds Monitor platelets by anticoagulation protocol: Yes   Date Time aPTT Rate/Comment  11/2 2040 108 Supratherapeutic @1000  units/hr 11/3 0621 87 Therapeutic x1 @900  units/hr 11/3 1605 86 Therapeutic x2 @900  units/hour 11/4     0343    77        Therapeutic x3 @ 900 units/hr  11/5     0532    69        Therapeutic x4 @ 900 units/hr  11/6:  HL @ 0532 = 0.31, therapeutic (low end, trending down) 11/7:  HL @ 0355 = 0.27, SUBtherapeutic 11/7 1256 HL 0.35 11/7 2022 HL 0.22, SUBtherapeutic   Plan:  Heparin  level is SUBtherapeutic Bolus 900 units and increase heparin  infusion to 1200 units/hr. Recheck heparin  level in 8 hours. CBC daily while on heparin .   Thank you for involving pharmacy in this patient's care.   Will M. Lenon, PharmD, BCPS Clinical Pharmacist 01/29/2024 9:24 PM

## 2024-01-29 NOTE — Progress Notes (Signed)
 Progress Note   Patient: Deborah Dawson FMW:[id number] DOB: January 24, 1951 DOA: 01/24/2024     5 DOS: the patient was seen and examined on 01/29/2024   Brief hospital course: 73 y.o. female with medical history significant of PAF on Eliquis, chronic HFrEF LVEF 25% with recurrent right-sided pleural effusion, PPM-ICD, HTN, IDDM, gout, presented with worsening of shortness of breath and leg swelling.  Symptoms started 3 days ago patient started to feel increasing exertional dyspnea, and also developed orthopnea, in addition she also noticed increasing leg swelling left more than right.    CTA showed nonocclusive pulmonary emboli improved distal right pulmonary artery right lower lobe artery and anterior and posterior segment branch no left-sided PE and no signs of right heart strain.   11/4.  900 mL drawn off with right-sided thoracentesis. 11/5.  Creatinine worse at 2.14 today.  Will give a fluid bolus.  250 cc.  Recheck creatinine tomorrow.  Spironolactone on hold. 11/6.  Creatinine 1.97 with a GFR of 26.  Continue to hold spironolactone.  Coreg  dose increased for high blood pressure. 11/7.  700 mL drawn off with repeat right-sided thoracentesis cytology and flow cytometry sent off also.  1.84 this afternoon with a GFR of 28.    Assessment and Plan: * Acute kidney injury superimposed on CKD AKI on CKD stage IIIb.  Creatinine 1.24 on presentation.  Creatinine peak 2.14.  Today's creatinine 1.84.  With GFR still less than 30, I cannot start Xarelto yet.  Acute on chronic systolic CHF (congestive heart failure) (HCC) Holding diuresis again today with impaired kidney function.  Received 250 mL liter fluid bolus on 11/5.  On Coreg .  Hold Aldactone.  Pleural effusion on right 900 mL of fluid drawn underneath the right lung on 11/4.  Repeat chest x-ray on 11/7 shows reaccumulation of fluid.  11/17 thoracentesis drew off 700 mL underneath right lung.  Repeat chest x-ray shows near complete resolution of  right pleural effusion with minimal residual blunting of the right costophrenic angle.  Cytology and flow cytometry sent off.  Acute pulmonary embolism (HCC) Currently on heparin  drip.  Was on Eliquis as outpatient.  Patient hesitant about Coumadin .  With her creatinine still elevated, I am hesitant on starting Xarelto today (with GFR still less than 30).  Recheck BMP tomorrow.  Paroxysmal atrial fibrillation (HCC) EKG showed paced rhythm.  Uncontrolled type 2 diabetes mellitus with hyperglycemia, with long-term current use of insulin  (HCC) Patient sliding-scale insulin .  Continue Lantus  and sliding scale.  Hemoglobin A1c 8.0  Acute respiratory failure with hypoxia (HCC) Ruled out        Subjective: Patient seen this morning before thoracentesis and this afternoon after thoracentesis.  Still has a little bit of a cough.  States she is breathing better after the thoracentesis.  Physical Exam: Vitals:   01/29/24 0534 01/29/24 0831 01/29/24 1146 01/29/24 1203  BP: (!) 163/72 (!) 158/75 (!) 163/74 (!) 165/74  Pulse: 60 63  61  Resp: 18   18  Temp: 97.8 F (36.6 C) 98 F (36.7 C)  97.6 F (36.4 C)  TempSrc:  Oral  Oral  SpO2: 93% 97%  98%  Weight:      Height:       Physical Exam HENT:     Head: Normocephalic.     Mouth/Throat:     Pharynx: No oropharyngeal exudate.  Eyes:     General: Lids are normal.     Conjunctiva/sclera: Conjunctivae normal.  Cardiovascular:  Rate and Rhythm: Normal rate and regular rhythm.     Heart sounds: Normal heart sounds, S1 normal and S2 normal.  Pulmonary:     Breath sounds: Examination of the right-lower field reveals decreased breath sounds. Decreased breath sounds present. No wheezing, rhonchi or rales.  Abdominal:     Palpations: Abdomen is soft.     Tenderness: There is no abdominal tenderness.  Musculoskeletal:     Right lower leg: Swelling present.     Left lower leg: Swelling present.  Skin:    General: Skin is warm.      Findings: No rash.  Neurological:     Mental Status: She is alert and oriented to person, place, and time.     Data Reviewed: Creatinine 1.84 with a GFR of 28  Family Communication: Husband at bedside in the afternoon  Disposition: Status is: Inpatient Remains inpatient appropriate because: Waiting for GFR to be above 30 to go home and switch over to Xarelto.  Planned Discharge Destination: Home    Time spent: 32 minutes  Author: Charlie Patterson, MD 01/29/2024 2:30 PM  For on call review [email or web address].

## 2024-01-29 NOTE — Procedures (Signed)
 PROCEDURE SUMMARY:  Successful image-guided diagnostic and therapeutic thoracentesis from the right chest.  Yielded 700 mL of amber fluid.  No immediate complications.  EBL: zero Patient tolerated well.   Specimen sent for labs.  Post-procedure CXR ordered.  Please see imaging section of Epic for full dictation.  Ornella Coderre B Wyett Narine NP 01/29/2024 11:20 AM

## 2024-01-30 ENCOUNTER — Other Ambulatory Visit: Payer: Self-pay

## 2024-01-30 DIAGNOSIS — I2699 Other pulmonary embolism without acute cor pulmonale: Secondary | ICD-10-CM | POA: Diagnosis not present

## 2024-01-30 DIAGNOSIS — I5023 Acute on chronic systolic (congestive) heart failure: Secondary | ICD-10-CM | POA: Diagnosis not present

## 2024-01-30 DIAGNOSIS — N179 Acute kidney failure, unspecified: Secondary | ICD-10-CM | POA: Diagnosis not present

## 2024-01-30 DIAGNOSIS — J90 Pleural effusion, not elsewhere classified: Secondary | ICD-10-CM | POA: Diagnosis not present

## 2024-01-30 LAB — BASIC METABOLIC PANEL WITH GFR
Anion gap: 10 (ref 5–15)
BUN: 37 mg/dL — ABNORMAL HIGH (ref 8–23)
CO2: 19 mmol/L — ABNORMAL LOW (ref 22–32)
Calcium: 8.4 mg/dL — ABNORMAL LOW (ref 8.9–10.3)
Chloride: 106 mmol/L (ref 98–111)
Creatinine, Ser: 1.6 mg/dL — ABNORMAL HIGH (ref 0.44–1.00)
GFR, Estimated: 33 mL/min — ABNORMAL LOW (ref >60)
Glucose, Bld: 147 mg/dL — ABNORMAL HIGH (ref 70–99)
Potassium: 4.6 mmol/L (ref 3.5–5.1)
Sodium: 135 mmol/L (ref 135–145)

## 2024-01-30 LAB — CBC
HCT: 42.3 % (ref 36.0–46.0)
Hemoglobin: 13.7 g/dL (ref 12.0–15.0)
MCH: 28.7 pg (ref 26.0–34.0)
MCHC: 32.4 g/dL (ref 30.0–36.0)
MCV: 88.7 fL (ref 80.0–100.0)
Platelets: 231 K/uL (ref 150–400)
RBC: 4.77 MIL/uL (ref 3.87–5.11)
RDW: 16.3 % — ABNORMAL HIGH (ref 11.5–15.5)
WBC: 7.8 K/uL (ref 4.0–10.5)
nRBC: 0 % (ref 0.0–0.2)

## 2024-01-30 LAB — HEPARIN LEVEL (UNFRACTIONATED): Heparin Unfractionated: 0.26 [IU]/mL — ABNORMAL LOW (ref 0.30–0.70)

## 2024-01-30 LAB — GLUCOSE, CAPILLARY: Glucose-Capillary: 128 mg/dL — ABNORMAL HIGH (ref 70–99)

## 2024-01-30 MED ORDER — SPIRONOLACTONE 25 MG PO TABS: 12.5000 mg | ORAL_TABLET | Freq: Every day | ORAL | 0 refills | Status: DC

## 2024-01-30 MED ORDER — LANTUS SOLOSTAR 100 UNIT/ML ~~LOC~~ SOPN: 20.0000 [IU] | PEN_INJECTOR | Freq: Every evening | SUBCUTANEOUS | Status: DC

## 2024-01-30 MED ORDER — HEPARIN BOLUS VIA INFUSION
950.0000 [IU] | Freq: Once | INTRAVENOUS | Status: DC
  Filled 2024-01-30: qty 950

## 2024-01-30 MED ORDER — ISOSORBIDE MONONITRATE ER 30 MG PO TB24: 30.0000 mg | ORAL_TABLET | Freq: Every day | ORAL | 0 refills | Status: DC

## 2024-01-30 MED ORDER — RIVAROXABAN (XARELTO) VTE STARTER PACK (15 & 20 MG): ORAL_TABLET | ORAL | 0 refills | Status: DC

## 2024-01-30 MED ORDER — CARVEDILOL 12.5 MG PO TABS: 12.5000 mg | ORAL_TABLET | Freq: Two times a day (BID) | ORAL | 0 refills | Status: DC

## 2024-01-30 MED ORDER — SPIRONOLACTONE 25 MG PO TABS
12.5000 mg | ORAL_TABLET | Freq: Every day | ORAL | 0 refills | Status: DC
  Filled 2024-01-30: qty 30, 60d supply, fill #0

## 2024-01-30 MED ORDER — CARVEDILOL 12.5 MG PO TABS
12.5000 mg | ORAL_TABLET | Freq: Two times a day (BID) | ORAL | 0 refills | Status: DC
  Filled 2024-01-30: qty 60, 30d supply, fill #0

## 2024-01-30 MED ORDER — RIVAROXABAN (XARELTO) VTE STARTER PACK (15 & 20 MG)
ORAL_TABLET | ORAL | 0 refills | Status: DC
  Filled 2024-01-30: qty 51, 30d supply, fill #0

## 2024-01-30 MED ORDER — RIVAROXABAN 20 MG PO TABS: 20.0000 mg | ORAL_TABLET | Freq: Every day | ORAL | Status: DC

## 2024-01-30 MED ORDER — ISOSORBIDE MONONITRATE ER 30 MG PO TB24
30.0000 mg | ORAL_TABLET | Freq: Every day | ORAL | 0 refills | Status: DC
  Filled 2024-01-30: qty 30, 30d supply, fill #0

## 2024-01-30 MED ORDER — LANTUS SOLOSTAR 100 UNIT/ML ~~LOC~~ SOPN: 20.0000 [IU] | PEN_INJECTOR | Freq: Every evening | SUBCUTANEOUS | 11 refills | Status: DC

## 2024-01-30 MED ORDER — RIVAROXABAN 15 MG PO TABS
15.0000 mg | ORAL_TABLET | Freq: Two times a day (BID) | ORAL | Status: DC
  Administered 2024-01-30: 15 mg via ORAL
  Filled 2024-01-30 (×2): qty 1

## 2024-01-30 NOTE — Discharge Summary (Signed)
 Physician Discharge Summary   Patient: Deborah Dawson MRN: [id number] DOB: 06-14-1950  Admit date:     01/24/2024  Discharge date: 01/30/24  Discharge Physician: Charlie Patterson   PCP: Sadie Manna, MD   Recommendations at discharge:   Follow-up PCP 5 days Follow-up cardiology 1 week Follow-up pulmonology 2 weeks  Discharge Diagnoses: Principal Problem:   Acute kidney injury superimposed on CKD Active Problems:   Acute on chronic systolic CHF (congestive heart failure) (HCC)   Pleural effusion on right   Acute pulmonary embolism (HCC)   Paroxysmal atrial fibrillation (HCC)   Uncontrolled type 2 diabetes mellitus with hyperglycemia, with long-term current use of insulin  (HCC)   Acute respiratory failure with hypoxia [facility])    Hospital Course: 73 y.o. female with medical history significant of PAF on Eliquis, chronic HFrEF LVEF 25% with recurrent right-sided pleural effusion, PPM-ICD, HTN, IDDM, gout, presented with worsening of shortness of breath and leg swelling.  Symptoms started 3 days ago patient started to feel increasing exertional dyspnea, and also developed orthopnea, in addition she also noticed increasing leg swelling left more than right.    CTA showed nonocclusive pulmonary emboli improved distal right pulmonary artery right lower lobe artery and anterior and posterior segment branch no left-sided PE and no signs of right heart strain.   11/4.  900 mL drawn off with right-sided thoracentesis. 11/5.  Creatinine worse at 2.14 today.  Will give a fluid bolus.  250 cc.  Recheck creatinine tomorrow.  Spironolactone on hold. 11/6.  Creatinine 1.97 with a GFR of 26.  Continue to hold spironolactone.  Coreg  dose increased for high blood pressure. 11/7.  700 mL drawn off with repeat right-sided thoracentesis cytology and flow cytometry sent off also.  1.84 this afternoon with a GFR of 28.    Assessment and Plan: * Acute kidney injury superimposed on CKD AKI on CKD stage  IIIb.  Creatinine 1.24 on presentation.  Creatinine peak 2.14.  Today's creatinine 1.6 with a GFR of 33.  Okay to start Xarelto and discharged home.  Acute on chronic systolic CHF (congestive heart failure) (HCC) Holding diuresis again today with impaired kidney function.  Received 250 mL liter fluid bolus on 11/5.  On Coreg , Aldactone.  Holding off on Lasix  at this point.  Close clinical follow-up on when to restart.  Recommend checking a BMP with follow-up appointment.  Pleural effusion on right 900 mL of fluid drawn underneath the right lung on 11/4.  Repeat chest x-ray on 11/7 shows reaccumulation of fluid.  11/17 thoracentesis drew off 700 mL underneath right lung.  Repeat chest x-ray shows near complete resolution of right pleural effusion with minimal residual blunting of the right costophrenic angle.  Cytology and flow cytometry sent off.  Will refer to pulmonary as outpatient will need to be followed for reaccumulation of fluid.  Acute pulmonary embolism (HCC) On heparin  drip during hospital course.  Was on Eliquis as outpatient.  Patient hesitant about Coumadin .  We were waiting for her creatinine clearance to go above 30, so we can start Xarelto.  Xarelto starter pack ordered  Paroxysmal atrial fibrillation (HCC) EKG showed paced rhythm.  Uncontrolled type 2 diabetes mellitus with hyperglycemia, with long-term current use of insulin  (HCC) Patient sliding-scale insulin .  Continue Lantus  and sliding scale.  Hemoglobin A1c 8.0  Acute respiratory failure with hypoxia (HCC) Ruled out         Consultants: Cardiology Procedures performed: None Disposition: Home Diet recommendation:  Cardiac diet DISCHARGE MEDICATION: Allergies  as of 01/30/2024       Reactions   Doxazosin Other (See Comments)   Cardura - cough   Doxycycline     Abdominal pain   Drug Class [clindamycin/lincomycin] Hives   Hydralazine Hcl Other (See Comments)   gout   Metformin  And Related Swelling    Ciprofloxacin Other (See Comments)   Numbness in face   Iodine Other (See Comments)   Pioglitazone Other (See Comments)   Sacubitril-valsartan Cough   Pt states that it gave her a cough and chest palpitations   Semaglutide Nausea Only   Sulfamethoxazole-trimethoprim Hives   Augmentin [amoxicillin-pot Clavulanate] Diarrhea   Dulaglutide Nausea Only   Hctz [hydrochlorothiazide] Other (See Comments)   Gout   Losartan Diarrhea   Poison Oak Extract Rash   Red Dye #40 (allura Red) Rash        Medication List     STOP taking these medications    acidophilus Caps capsule   digoxin  0.25 MG tablet Commonly known as: LANOXIN    Eliquis 5 MG Tabs tablet Generic drug: apixaban   furosemide  20 MG tablet Commonly known as: LASIX        TAKE these medications    carvedilol  12.5 MG tablet Commonly known as: COREG  Take 1 tablet (12.5 mg total) by mouth 2 (two) times daily with a meal. What changed:  medication strength how much to take   fluticasone 50 MCG/ACT nasal spray Commonly known as: FLONASE Place 1 spray into both nostrils 2 (two) times daily.   gabapentin 100 MG capsule Commonly known as: NEURONTIN Take 100 mg by mouth 2 (two) times daily.   isosorbide mononitrate 30 MG 24 hr tablet Commonly known as: IMDUR Take 1 tablet (30 mg total) by mouth daily.   Lantus  SoloStar 100 UNIT/ML Solostar Pen Generic drug: insulin  glargine Inject 20 Units into the skin at bedtime.   Magnesium  Gluconate 550 MG Tabs Take by mouth.   spironolactone 25 MG tablet Commonly known as: ALDACTONE Take 1/2 tablet (12.5 mg total) by mouth daily.   Vitamin D (Ergocalciferol) 50 MCG (2000 UT) Caps Take 1 capsule by mouth daily.   Xarelto Starter Pack Generic drug: Rivaroxaban Starter Pack (15 mg and 20 mg) Follow package directions: Take one 15mg  tablet by mouth twice a day. On day 22, switch to one 20mg  tablet once a day. Take with food.        Follow-up Information      Paraschos, Alexander, MD. Go in 1 week(s).   Specialty: Cardiology Contact information: [address] [facility] [place] [address] [phone]         Parris Manna, MD Follow up in 2 week(s).   Specialty: Pulmonary Disease Contact information: [address] [place] [address] [phone]         Sadie Manna, MD Follow up in 5 day(s).   Specialty: Internal Medicine Contact information: [address] [place] [address] [phone]                Discharge Exam: Fredricka Weights   01/28/24 0500 01/29/24 0500 01/30/24 0500  Weight: 71.4 kg 70.2 kg 67.3 kg   Physical Exam HENT:     Head: Normocephalic.     Mouth/Throat:     Pharynx: No oropharyngeal exudate.  Eyes:     General: Lids are normal.     Conjunctiva/sclera: Conjunctivae normal.  Cardiovascular:     Rate and Rhythm: Normal rate and regular rhythm.     Heart  sounds: Normal heart sounds, S1 normal and S2 normal.  Pulmonary:     Breath sounds: Examination of the right-lower field reveals decreased breath sounds. Decreased breath sounds present. No wheezing, rhonchi or rales.  Abdominal:     Palpations: Abdomen is soft.     Tenderness: There is no abdominal tenderness.  Musculoskeletal:     Right lower leg: Swelling present.     Left lower leg: Swelling present.  Skin:    General: Skin is warm.     Findings: No rash.  Neurological:     Mental Status: She is alert and oriented to person, place, and time.      Condition at discharge: stable  The results of significant diagnostics from this hospitalization (including imaging, microbiology, ancillary and laboratory) are listed below for reference.   Imaging Studies: US  THORACENTESIS ASP PLEURAL SPACE W/IMG GUIDE Result Date: 01/29/2024 INDICATION: 73 year old female presents with shortness of breath, cough, and recurrent right pleural effusion. Received request for diagnostic and  therapeutic thoracentesis. EXAM: ULTRASOUND GUIDED RIGHT THORACENTESIS MEDICATIONS: 10 mL 1% lidocaine  COMPLICATIONS: None immediate. PROCEDURE: An ultrasound guided thoracentesis was thoroughly discussed with the patient and questions answered. The benefits, risks, alternatives and complications were also discussed. The patient understands and wishes to proceed with the procedure. Written consent was obtained. Ultrasound was performed to localize and mark an adequate pocket of fluid in the right chest. The area was then prepped and draped in the normal sterile fashion. 1% lidocaine  was used for local anesthesia. Under ultrasound guidance a 6 Fr Safe-T-Centesis catheter was introduced. Thoracentesis was performed. The catheter was removed and a dressing applied. FINDINGS: A total of approximately 700 mL of amber fluid was removed. Samples were sent to the laboratory as requested by the clinical team. IMPRESSION: Successful ultrasound guided right thoracentesis yielding 700 mL of pleural fluid. Performed by: Kristi Davenport, NP Electronically Signed   By: Cordella Banner   On: 01/29/2024 15:39   DG Chest Port 1 View Result Date: 01/29/2024 EXAM: 1 VIEW(S) XRAY OF THE CHEST 01/29/2024 11:40:00 AM COMPARISON: 01/29/2024 CLINICAL HISTORY: Recurrent right pleural effusion FINDINGS: LINES, TUBES AND DEVICES: Left chest pacemaker with leads terminating in the right atrium and coronary sinus. LUNGS AND PLEURA: No focal pulmonary opacity. No pulmonary edema. Near complete resolution of the right pleural effusion with minimal blunting of the right costophrenic angle. Improved aeration of right lung base. No pneumothorax. HEART AND MEDIASTINUM: Cardiomegaly. BONES AND SOFT TISSUES: No acute osseous abnormality. Cholecystectomy clips. IMPRESSION: 1. Near complete resolution of the right pleural effusion with minimal residual blunting of the right costophrenic angle and improved aeration of the right lung base.  Electronically signed by: Rogelia Myers MD 01/29/2024 12:53 PM EST RP Workstation: [id number]   DG Chest 2 View Result Date: 01/29/2024 EXAM: 2 VIEW(S) XRAY OF THE CHEST 01/29/2024 06:46:00 AM COMPARISON: 01/26/2024 CLINICAL HISTORY: Pleural effusion on right. FINDINGS: LINES, TUBES AND DEVICES: Stable 2 lead left chest pacemaker. LUNGS AND PLEURA: Increased right base airspace disease. Small right pleural effusion, mildly increased. No pulmonary edema. No pneumothorax. HEART AND MEDIASTINUM: No acute abnormality of the cardiac and mediastinal silhouettes. BONES AND SOFT TISSUES: Upper abdominal surgical clips noted. No acute osseous abnormality. IMPRESSION: 1. Redeveloping small right pleural effusion with increased right base airspace disease, most likely atelectasis. 2. No pneumothorax. Electronically signed by: Rockey Kilts MD 01/29/2024 11:11 AM EST RP Workstation: [id number]   US  THORACENTESIS ASP PLEURAL SPACE W/IMG GUIDE Result Date: 01/26/2024 INDICATION: 73 year  old female presents with shortness of breath. Received request for diagnostic and therapeutic thoracentesis. EXAM: ULTRASOUND GUIDED RIGHT THORACENTESIS MEDICATIONS: 8 mL 1% lidocaine  COMPLICATIONS: None immediate. PROCEDURE: An ultrasound guided thoracentesis was thoroughly discussed with the patient and questions answered. The benefits, risks, alternatives and complications were also discussed. The patient understands and wishes to proceed with the procedure. Written consent was obtained. Ultrasound was performed to localize and mark an adequate pocket of fluid in the right chest. The area was then prepped and draped in the normal sterile fashion. 1% lidocaine  was used for local anesthesia. Under ultrasound guidance a 6 Fr Safe-T-Centesis catheter was introduced. Thoracentesis was performed. The catheter was removed and a dressing applied. FINDINGS: A total of approximately 900 mL of clear yellow fluid was removed. Samples were sent to the  laboratory as requested by the clinical team. IMPRESSION: Successful ultrasound guided right thoracentesis yielding 900 mL of pleural fluid. Performed by: Rayfield Buff, NP Electronically Signed   By: Cordella Banner   On: 01/26/2024 14:49   DG Chest Port 1 View Result Date: 01/26/2024 EXAM: 1 VIEW(S) XRAY OF THE CHEST 01/26/2024 01:02:00 PM COMPARISON: 01/25/2024 CLINICAL HISTORY: SOB (shortness of breath); Status post thoracentesis FINDINGS: LUNGS AND PLEURA: Evacuation of right pleural effusion with a small amount of residual fluid. Minimal overlying atelectasis. The left lung remains clear No post-procedural pneumothorax. No pulmonary edema. No pneumothorax. HEART AND MEDIASTINUM: Cardiomegaly with left subclavian approach cardiac rhythm maintenance device. Aortic arch atherosclerosis. BONES AND SOFT TISSUES: No acute osseous abnormality. IMPRESSION: 1. Evacuation of right pleural effusion with a small residual effusion and minimal overlying atelectasis. No pneumothorax. 2. No post-procedure or pneumothorax. Electronically signed by: Maude Stammer MD 01/26/2024 01:38 PM EST RP Workstation: [id number]   DG Chest Port 1 View Result Date: 01/25/2024 EXAM: 1 VIEW(S) XRAY OF THE CHEST 01/25/2024 09:41:07 AM COMPARISON: 01/24/2024 CLINICAL HISTORY: Shortness of breath FINDINGS: LUNGS AND PLEURA: Small right pleural effusion. Increased right lower and developing inferior right upper lobe airspace disease. Diffuse interstitial thickening. No pneumothorax. HEART AND MEDIASTINUM: Left subclavian dual lead pacemaker unchanged. Cardiomegaly. BONES AND SOFT TISSUES: No acute osseous abnormality. IMPRESSION: 1. Persistent small right pleural effusion with increased right lower and developing inferior right upper airspace disease, favoring pneumonia 2. Cardiomegaly with increased interstitial thickening, favoring mild pulmonary edema Electronically signed by: Rockey Kilts MD 01/25/2024 05:05 PM EST RP Workstation:  [id number]   US  Venous Img Lower Bilateral (DVT) Result Date: 01/24/2024 CLINICAL DATA:  Pulmonary embolism. EXAM: BILATERAL LOWER EXTREMITY VENOUS DOPPLER ULTRASOUND TECHNIQUE: Gray-scale sonography with compression, as well as color and duplex ultrasound, were performed to evaluate the deep venous system(s) from the level of the common femoral vein through the popliteal and proximal calf veins. COMPARISON:  None Available. FINDINGS: VENOUS Normal compressibility of the common femoral, superficial femoral, and popliteal veins, as well as the visualized calf veins. Visualized portions of profunda femoral vein and great saphenous vein unremarkable. No filling defects to suggest DVT on grayscale or color Doppler imaging. Doppler waveforms show normal direction of venous flow, normal respiratory plasticity and response to augmentation. Limited views of the contralateral common femoral vein are unremarkable. OTHER None. Limitations: none IMPRESSION: Negative. Electronically Signed   By: Leita Birmingham M.D.   On: 01/24/2024 14:19   CT Angio Chest PE W and/or Wo Contrast Addendum Date: 01/24/2024 ADDENDUM #1 ADDENDUM: The above findings were discussed with Dr. Levander at 9:53 am 01/24/24. ---------------------------------------------------- Electronically signed by: Evalene Coho MD 01/24/2024 10:49 AM EST  RP Workstation: [id number]   Result Date: 01/24/2024 ORIGINAL REPORT EXAM: CTA of the Chest with contrast for PE 01/24/2024 09:25:18 AM TECHNIQUE: CTA of the chest was performed without and with the administration of 75 mL of iohexol  (OMNIPAQUE ) 350 MG/ML injection. Multiplanar reformatted images are provided for review. MIP images are provided for review. Automated exposure control, iterative reconstruction, and/or weight based adjustment of the mA/kV was utilized to reduce the radiation dose to as low as reasonably achievable. COMPARISON: PA and lateral radiographs of the chest dated 01/24/2024. CLINICAL HISTORY:  Pulmonary embolism (PE) suspected, low to intermediate prob, positive D-dimer. FINDINGS: PULMONARY ARTERIES: Pulmonary arteries are adequately opacified for evaluation. There is nonocclusive thromboembolic disease present within the distal right pulmonary artery and within the right lower lobe artery and anterior segmental branch of the right lower lobe pulmonary artery. There is also thromboembolus within the posterior segmental artery. There is no definite thromboembolic disease demonstrated in the left lung. Main pulmonary artery is normal in caliber. MEDIASTINUM: The heart is enlarged. There is no evidence of right ventricular strain. There is mild calcific coronary artery disease. The thoracic aorta demonstrates mild-to-moderate calcific atheromatous disease. The ascending thoracic aorta measures approximately 3.3 cm in diameter. LYMPH NODES: There are numerous enlarged mediastinal lymph nodes. There is also right hilar lymphadenopathy. No axillary lymphadenopathy. LUNGS AND PLEURA: There is mild dependent atelectasis within the right lung. There is a moderate right-sided pleural effusion. No pneumothorax. No focal consolidation or pulmonary edema. UPPER ABDOMEN: Limited images of the upper abdomen are unremarkable. SOFT TISSUES AND BONES: No acute bone or soft tissue abnormality. IMPRESSION: 1. Nonocclusive pulmonary emboli involving the distal right pulmonary artery, right lower lobe artery, and anterior and posterior segmental branches; no left-sided PE identified. No CT evidence of right heart strain. 2. Moderate right pleural effusion. 3. Cardiomegaly with mild coronary artery calcifications and mild-to-moderate thoracic aortic atherosclerosis. 4. Enlarged mediastinal and right hilar lymph nodes; recommend clinical correlation and follow-up per oncologic/infectious/inflammatory considerations. Electronically signed by: Evalene Coho MD 01/24/2024 09:51 AM EST RP Workstation: [id number]   DG Chest 2  View Result Date: 01/24/2024 CLINICAL DATA:  Shortness of breath. EXAM: CHEST - 2 VIEW COMPARISON:  08/08/2020 FINDINGS: Mild cardiomegaly, with dual lead pacemaker in place. Small to moderate right pleural effusion. Right basilar atelectasis versus infiltrate. IMPRESSION: Small to moderate right pleural effusion, with right basilar atelectasis versus infiltrate. Electronically Signed   By: Norleen DELENA Kil M.D.   On: 01/24/2024 08:29    Microbiology: Results for orders placed or performed during the hospital encounter of 01/24/24  Resp panel by RT-PCR (RSV, Flu A&B, Covid) Anterior Nasal Swab     Status: None   Collection Time: 01/24/24  8:32 AM   Specimen: Anterior Nasal Swab  Result Value Ref Range Status   SARS Coronavirus 2 by RT PCR NEGATIVE NEGATIVE Final    Comment: (NOTE) SARS-CoV-2 target nucleic acids are NOT DETECTED.  The SARS-CoV-2 RNA is generally detectable in upper respiratory specimens during the acute phase of infection. The lowest concentration of SARS-CoV-2 viral copies this assay can detect is 138 copies/mL. A negative result does not preclude SARS-Cov-2 infection and should not be used as the sole basis for treatment or other patient management decisions. A negative result may occur with  improper specimen collection/handling, submission of specimen other than nasopharyngeal swab, presence of viral mutation(s) within the areas targeted by this assay, and inadequate number of viral copies(<138 copies/mL). A negative result must be combined with clinical observations,  patient history, and epidemiological information. The expected result is Negative.  Fact Sheet for Patients:  bloggercourse.com  Fact Sheet for Healthcare Providers:  seriousbroker.it  This test is no t yet approved or cleared by the [place]  FDA and  has been authorized for detection and/or diagnosis of SARS-CoV-2 by FDA under an Emergency Use  Authorization (EUA). This EUA will remain  in effect (meaning this test can be used) for the duration of the COVID-19 declaration under Section 564(b)(1) of the Act, 21 U.S.C.section 360bbb-3(b)(1), unless the authorization is terminated  or revoked sooner.       Influenza A by PCR NEGATIVE NEGATIVE Final   Influenza B by PCR NEGATIVE NEGATIVE Final    Comment: (NOTE) The Xpert Xpress SARS-CoV-2/FLU/RSV plus assay is intended as an aid in the diagnosis of influenza from Nasopharyngeal swab specimens and should not be used as a sole basis for treatment. Nasal washings and aspirates are unacceptable for Xpert Xpress SARS-CoV-2/FLU/RSV testing.  Fact Sheet for Patients: bloggercourse.com  Fact Sheet for Healthcare Providers: seriousbroker.it  This test is not yet approved or cleared by the [place]  FDA and has been authorized for detection and/or diagnosis of SARS-CoV-2 by FDA under an Emergency Use Authorization (EUA). This EUA will remain in effect (meaning this test can be used) for the duration of the COVID-19 declaration under Section 564(b)(1) of the Act, 21 U.S.C. section 360bbb-3(b)(1), unless the authorization is terminated or revoked.     Resp Syncytial Virus by PCR NEGATIVE NEGATIVE Final    Comment: (NOTE) Fact Sheet for Patients: bloggercourse.com  Fact Sheet for Healthcare Providers: seriousbroker.it  This test is not yet approved or cleared by the [place]  FDA and has been authorized for detection and/or diagnosis of SARS-CoV-2 by FDA under an Emergency Use Authorization (EUA). This EUA will remain in effect (meaning this test can be used) for the duration of the COVID-19 declaration under Section 564(b)(1) of the Act, 21 U.S.C. section 360bbb-3(b)(1), unless the authorization is terminated or revoked.  Performed at [facility], [address] [place], [place] [address]   Body fluid culture w Gram Stain     Status: None (Preliminary result)   Collection Time: 01/29/24 11:00 AM   Specimen: PATH Cytology Pleural fluid  Result Value Ref Range Status   Specimen Description   Final    PLEURAL Performed at [facility], [address], [place], [place] [address]    Special Requests   Final    NONE Performed at [facility], [address] [place], [place] [address]    Gram Stain   Final    RARE WBC PRESENT, PREDOMINANTLY MONONUCLEAR NO ORGANISMS SEEN    Culture   Final    NO GROWTH < 24 HOURS Performed at [facility], [address], [place], [place] [address]    Report Status PENDING  Incomplete    Labs: CBC: Recent Labs  Lab 01/24/24 0800 01/25/24 0327 01/27/24 0532 01/30/24 0510  WBC 8.4 6.9 6.5 7.8  HGB 14.2 12.7 13.5 13.7  HCT 45.5 38.4 41.9 42.3  MCV 90.1 86.7 87.7 88.7  PLT 239 212 227 231   Basic Metabolic Panel: Recent Labs  Lab 01/27/24 0532 01/28/24 0403 01/29/24 0355 01/29/24 1256 01/30/24 0510  NA 135 135 138 138 135  K 4.3 4.6 4.3 4.4 4.6  CL 99 99 105 101 106  CO2 23 24 22 26  19*  GLUCOSE 141* 138* 135* 208* 147*  BUN  41* 36* 38* 36* 37*  CREATININE 2.14* 1.97* 1.80* 1.84* 1.60*  CALCIUM 8.7* 8.6* 8.3* 8.6* 8.4*   Liver Function Tests: Recent Labs  Lab 01/24/24 0800  AST 18  ALT 9  ALKPHOS 61  BILITOT 1.4*  PROT 8.0  ALBUMIN 3.4*   CBG: Recent Labs  Lab 01/28/24 2140 01/29/24 0808 01/29/24 1202 01/29/24 2045 01/30/24 0740  GLUCAP 185* 112* 210* 221* 128*    Discharge time spent: greater than 30 minutes.  Signed: Charlie Patterson, MD [facility] 01/30/2024

## 2024-01-30 NOTE — Progress Notes (Signed)
 PHARMACY - ANTICOAGULATION CONSULT NOTE  Pharmacy Consult for Heparin  Indication: pulmonary embolus  Allergies  Allergen Reactions   Doxazosin Other (See Comments)    Cardura - cough   Doxycycline      Abdominal pain   Drug Class [Clindamycin/Lincomycin] Hives   Hydralazine Hcl Other (See Comments)    gout   Metformin  And Related Swelling   Ciprofloxacin Other (See Comments)    Numbness in face   Iodine Other (See Comments)   Pioglitazone Other (See Comments)   Sacubitril-Valsartan Cough    Pt states that it gave her a cough and chest palpitations   Semaglutide Nausea Only   Sulfamethoxazole-Trimethoprim Hives   Augmentin [Amoxicillin-Pot Clavulanate] Diarrhea   Dulaglutide Nausea Only   Hctz [Hydrochlorothiazide] Other (See Comments)    Gout   Losartan Diarrhea   Poison Oak Extract Rash   Red Dye #40 (Allura Red) Rash    Patient Measurements: Height: 5' 1 (154.9 cm) Weight: 67.3 kg (148 lb 5.9 oz) IBW/kg (Calculated) : 47.8 HEPARIN  DW (KG): 62.6  Vital Signs: Temp: 98.4 F (36.9 C) (11/08 0451) Temp Source: Oral (11/07 2042) BP: 155/90 (11/08 0451) Pulse Rate: 60 (11/08 0451)  Labs: Recent Labs    01/28/24 0403 01/29/24 0355 01/29/24 1256 01/29/24 2022 01/30/24 0510  HGB  --   --   --   --  13.7  HCT  --   --   --   --  42.3  PLT  --   --   --   --  231  HEPARINUNFRC 0.31 0.27* 0.35 0.22* 0.26*  CREATININE 1.97* 1.80* 1.84*  --   --     Estimated Creatinine Clearance: 23.2 mL/min (A) (by C-G formula based on SCr of 1.84 mg/dL (H)).   Medical History: Past Medical History:  Diagnosis Date   Allergic genetic state    Atrial fibrillation (HCC) 2010   Cardiomyopathy (HCC)    CHF (congestive heart failure) (HCC)    Diabetes mellitus without complication (HCC)    Dysrhythmia    Gout    H/O cardiac catheterization    Hypertension    Joint pain    Osteopenia    Presence of permanent cardiac pacemaker    Psoriasis     Medications:  Medications  Prior to Admission  Medication Sig Dispense Refill Last Dose/Taking   apixaban (ELIQUIS) 5 MG TABS tablet Take 5 mg by mouth 2 (two) times daily.   01/24/2024   carvedilol  (COREG ) 3.125 MG tablet Take 1 tablet (3.125 mg total) by mouth 2 (two) times daily with a meal. 60 tablet 1 01/24/2024   digoxin  (LANOXIN ) 0.25 MG tablet Take 1 tablet (0.25 mg total) by mouth daily. 30 tablet 0 01/24/2024   furosemide  (LASIX ) 20 MG tablet Take 10 mg by mouth daily.   01/24/2024   gabapentin (NEURONTIN) 100 MG capsule Take 100 mg by mouth 2 (two) times daily.   01/24/2024   insulin  glargine (LANTUS  SOLOSTAR) 100 UNIT/ML Solostar Pen Inject 15 Units into the skin at bedtime. (Patient taking differently: Inject 20 Units into the skin at bedtime.) 4.5 mL 11 01/23/2024   Magnesium  Gluconate 550 MG TABS Take by mouth.   Taking   Vitamin D, Ergocalciferol, 2000 units CAPS Take 1 capsule by mouth daily.   Taking   acidophilus (RISAQUAD) CAPS capsule Take 2 capsules by mouth 3 (three) times daily. (Patient not taking: Reported on 03/04/2023) 30 capsule 0 Not Taking   albuterol  (VENTOLIN  HFA) 108 (90 Base) MCG/ACT inhaler  Inhale 2 puffs into the lungs every 4 (four) hours as needed for shortness of breath. (Patient not taking: Reported on 03/04/2023)   Not Taking   amLODipine (NORVASC) 2.5 MG tablet Take 2.5 mg by mouth daily. (Patient not taking: Reported on 01/24/2024)   Not Taking   Calcium Carb-Cholecalciferol (CALCIUM 500+D PO) Take 1 tablet by mouth daily. (Patient not taking: Reported on 01/24/2024)   Not Taking   cloNIDine  (CATAPRES ) 0.2 MG tablet Take 0.2 mg by mouth 3 (three) times daily. (Patient not taking: Reported on 03/04/2023)   Not Taking   colchicine 0.6 MG tablet Take 0.6-1.8 mg by mouth as directed. (Patient not taking: Reported on 03/04/2023)   Not Taking   dexamethasone  (DECADRON ) 6 MG tablet Take 1 tablet (6 mg total) by mouth daily. (Patient not taking: Reported on 03/04/2023) 5 tablet 0 Not Taking    diltiazem  (TIAZAC ) 120 MG 24 hr capsule Take 120 mg by mouth daily. (Patient not taking: Reported on 03/04/2023)   Not Taking   fluticasone (FLONASE) 50 MCG/ACT nasal spray Place 1 spray into both nostrils 2 (two) times daily. (Patient not taking: Reported on 01/24/2024)   Not Taking   glimepiride  (AMARYL ) 4 MG tablet Take 4 mg by mouth 2 (two) times daily. (Patient not taking: Reported on 03/04/2023)   Not Taking   guaiFENesin -codeine 100-10 MG/5ML syrup Take 5 mLs by mouth every 4 (four) hours as needed for cough. (Patient not taking: Reported on 03/04/2023)   Not Taking   insulin  lispro (HUMALOG) 100 UNIT/ML injection Inject 12 Units into the skin 3 (three) times daily before meals. (Patient not taking: Reported on 01/24/2024)   Not Taking   Multiple Vitamin (MULTIVITAMIN WITH MINERALS) TABS tablet Take 1 tablet by mouth daily. (Patient not taking: Reported on 01/24/2024) 30 tablet 0 Not Taking   nystatin cream (MYCOSTATIN) Apply 1 Application topically 2 (two) times daily. (Patient not taking: Reported on 01/24/2024)   Not Taking   Semaglutide,0.25 or 0.5MG /DOS, (OZEMPIC, 0.25 OR 0.5 MG/DOSE,) 2 MG/1.5ML SOPN Inject into the skin. (Patient not taking: Reported on 03/04/2023)   Not Taking   spironolactone (ALDACTONE) 25 MG tablet Take 12.5 mg by mouth daily. (Patient not taking: Reported on 01/24/2024)   Not Taking   Turmeric 500 MG CAPS Take 1 capsule by mouth daily. (Patient not taking: Reported on 01/24/2024)   Not Taking   warfarin (COUMADIN ) 2 MG tablet Take 1 tablet (2 mg total) by mouth daily at 4 PM. (Patient not taking: Reported on 03/04/2023) 30 tablet 1 Not Taking   Scheduled:   carvedilol   12.5 mg Oral BID WC   gabapentin  100 mg Oral BID   heparin   950 Units Intravenous Once   insulin  aspart  0-9 Units Subcutaneous TID WC   insulin  glargine-yfgn  15 Units Subcutaneous QHS   isosorbide mononitrate  30 mg Oral Daily   sodium chloride  flush  3 mL Intravenous Q12H   spironolactone  12.5 mg  Oral Daily   Infusions:   heparin  1,200 Units/hr (01/30/24 0008)   PRN:  Anti-infectives (From admission, onward)    None       Assessment: Patient is a 73 yo F presenting with SOB and an ongoing cough that has been present for several months. PMH significant for nonischemic cardiomyopathy, A-fib (on Eliquis), hypertension, T2DM, bradycardia resulted with s/p pacemaker. Pharmacy is consulted for heparin  dosing and monitoring. Last dose of Eliquis is unknown.  11/2: CT chest remarkable for nonocclusive pulmonary emboli  involving the distal right pulmonary artery, right lower lobe artery, and anterior and posterior segmental branches.   Baseline CBC is stable. Baseline aPTT, INR, and HL ordered as add on labs.    Goal of Therapy:  Heparin  level 0.3-0.7 units/ml aPTT 66-102 seconds Monitor platelets by anticoagulation protocol: Yes   Date Time aPTT Rate/Comment  11/2 2040 108 Supratherapeutic @1000  units/hr 11/3 9378 87 Therapeutic x1 @900  units/hr 11/3 1605 86 Therapeutic x2 @900  units/hour 11/4     0343    77        Therapeutic x3 @ 900 units/hr  11/5     0532    69        Therapeutic x4 @ 900 units/hr  11/6:  HL @ 0532 = 0.31, therapeutic (low end, trending down) 11/7:  HL @ 0355 = 0.27, SUBtherapeutic 11/7 1256 HL 0.35 11/7 2022 HL 0.22, SUBtherapeutic 11/8     0510    HL 0.26, SUBtherapeutic   Plan:  11/8:  HL @ 0510 = 0.26, SUBtherapeutic - Will order heparin  950 units IV X 1 bolus and increase drip rate to 1300 units/hr - Will recheck HL 8 hrs after rate change CBC daily while on heparin .   Thank you for involving pharmacy in this patient's care.   Ronny Ruddell D Clinical Pharmacist 01/30/2024 6:49 AM

## 2024-01-30 NOTE — Plan of Care (Signed)
  Problem: Education: Goal: Knowledge of General Education information will improve Description: Including pain rating scale, medication(s)/side effects and non-pharmacologic comfort measures Outcome: Progressing   Problem: Clinical Measurements: Goal: Respiratory complications will improve Outcome: Progressing   Problem: Clinical Measurements: Goal: Cardiovascular complication will be avoided Outcome: Progressing   Problem: Activity: Goal: Risk for activity intolerance will decrease Outcome: Progressing   Problem: Pain Managment: Goal: General experience of comfort will improve and/or be controlled Outcome: Progressing   Problem: Safety: Goal: Ability to remain free from injury will improve Outcome: Progressing

## 2024-01-30 NOTE — Discharge Instructions (Signed)
 Check bmp with follow up appointment Will need close follow up for re-accumulation of fluid under right lung

## 2024-01-30 NOTE — Progress Notes (Signed)
 [facility]    SUBJECTIVE: Shortness of breath started to improve improved cough no no chest pain no significant lower extremity edema.  Patient somewhat improved after thoracentesis x 2   Vitals:   01/30/24 0022 01/30/24 0451 01/30/24 0500 01/30/24 0745  BP: (!) 153/60 (!) 155/90  (!) 171/86  Pulse: 60 60  60  Resp: 20 20    Temp: 98.4 F (36.9 C) 98.4 F (36.9 C)  97.6 F (36.4 C)  TempSrc:      SpO2: 95% 94%  95%  Weight:   67.3 kg   Height:         Intake/Output Summary (Last 24 hours) at 01/30/2024 9096 Last data filed at 01/30/2024 0600 Gross per 24 hour  Intake 963.87 ml  Output --  Net 963.87 ml      PHYSICAL EXAM  General: Well developed, well nourished, in no acute distress HEENT:  Normocephalic and atramatic Neck:  No JVD.  Lungs: Dullness bilaterally to auscultation and percussion. Heart: HRRR . Normal S1 and S2 without gallops or murmurs.  Abdomen: Bowel sounds are positive, abdomen soft and non-tender  Msk:  Back normal, normal gait. Normal strength and tone for age. Extremities: No clubbing, cyanosis or edema.   Neuro: Alert and oriented X 3. Psych:  Good affect, responds appropriately   LABS: Basic Metabolic Panel: Recent Labs    01/29/24 1256 01/30/24 0510  NA 138 135  K 4.4 4.6  CL 101 106  CO2 26 19*  GLUCOSE 208* 147*  BUN 36* 37*  CREATININE 1.84* 1.60*  CALCIUM 8.6* 8.4*   Liver Function Tests: No results for input(s): AST, ALT, ALKPHOS, BILITOT, PROT, ALBUMIN in the last 72 hours. No results for input(s): LIPASE, AMYLASE in the last 72 hours. CBC: Recent Labs    01/30/24 0510  WBC 7.8  HGB 13.7  HCT 42.3  MCV 88.7  PLT 231   Cardiac Enzymes: No results for input(s): CKTOTAL, CKMB, CKMBINDEX, TROPONINI in the last 72 hours. BNP: Invalid input(s): POCBNP D-Dimer: No results for input(s): DDIMER in the last 72 hours. Hemoglobin A1C: No results for input(s): HGBA1C in the last 72  hours. Fasting Lipid Panel: No results for input(s): CHOL, HDL, LDLCALC, TRIG, CHOLHDL, LDLDIRECT in the last 72 hours. Thyroid Function Tests: No results for input(s): TSH, T4TOTAL, T3FREE, THYROIDAB in the last 72 hours.  Invalid input(s): FREET3 Anemia Panel: No results for input(s): VITAMINB12, FOLATE, FERRITIN, TIBC, IRON, RETICCTPCT in the last 72 hours.  US  THORACENTESIS ASP PLEURAL SPACE W/IMG GUIDE Result Date: 01/29/2024 INDICATION: 73 year old female presents with shortness of breath, cough, and recurrent right pleural effusion. Received request for diagnostic and therapeutic thoracentesis. EXAM: ULTRASOUND GUIDED RIGHT THORACENTESIS MEDICATIONS: 10 mL 1% lidocaine  COMPLICATIONS: None immediate. PROCEDURE: An ultrasound guided thoracentesis was thoroughly discussed with the patient and questions answered. The benefits, risks, alternatives and complications were also discussed. The patient understands and wishes to proceed with the procedure. Written consent was obtained. Ultrasound was performed to localize and mark an adequate pocket of fluid in the right chest. The area was then prepped and draped in the normal sterile fashion. 1% lidocaine  was used for local anesthesia. Under ultrasound guidance a 6 Fr Safe-T-Centesis catheter was introduced. Thoracentesis was performed. The catheter was removed and a dressing applied. FINDINGS: A total of approximately 700 mL of amber fluid was removed. Samples were sent to the laboratory as requested by the clinical team. IMPRESSION: Successful ultrasound guided right thoracentesis yielding 700 mL  of pleural fluid. Performed by: Kristi Davenport, NP Electronically Signed   By: Cordella Banner   On: 01/29/2024 15:39   DG Chest Port 1 View Result Date: 01/29/2024 EXAM: 1 VIEW(S) XRAY OF THE CHEST 01/29/2024 11:40:00 AM COMPARISON: 01/29/2024 CLINICAL HISTORY: Recurrent right pleural effusion FINDINGS: LINES, TUBES AND  DEVICES: Left chest pacemaker with leads terminating in the right atrium and coronary sinus. LUNGS AND PLEURA: No focal pulmonary opacity. No pulmonary edema. Near complete resolution of the right pleural effusion with minimal blunting of the right costophrenic angle. Improved aeration of right lung base. No pneumothorax. HEART AND MEDIASTINUM: Cardiomegaly. BONES AND SOFT TISSUES: No acute osseous abnormality. Cholecystectomy clips. IMPRESSION: 1. Near complete resolution of the right pleural effusion with minimal residual blunting of the right costophrenic angle and improved aeration of the right lung base. Electronically signed by: Rogelia Myers MD 01/29/2024 12:53 PM EST RP Workstation: [id number]   DG Chest 2 View Result Date: 01/29/2024 EXAM: 2 VIEW(S) XRAY OF THE CHEST 01/29/2024 06:46:00 AM COMPARISON: 01/26/2024 CLINICAL HISTORY: Pleural effusion on right. FINDINGS: LINES, TUBES AND DEVICES: Stable 2 lead left chest pacemaker. LUNGS AND PLEURA: Increased right base airspace disease. Small right pleural effusion, mildly increased. No pulmonary edema. No pneumothorax. HEART AND MEDIASTINUM: No acute abnormality of the cardiac and mediastinal silhouettes. BONES AND SOFT TISSUES: Upper abdominal surgical clips noted. No acute osseous abnormality. IMPRESSION: 1. Redeveloping small right pleural effusion with increased right base airspace disease, most likely atelectasis. 2. No pneumothorax. Electronically signed by: Rockey Kilts MD 01/29/2024 11:11 AM EST RP Workstation: [id number]     Echo Severely depressed LVF 20-25%  TELEMETRY: Paced rhythm 70:  ASSESSMENT AND PLAN:  Principal Problem:   Acute kidney injury superimposed on CKD Active Problems:   Acute respiratory failure with hypoxia (HCC)   Acute pulmonary embolism (HCC)   Pleural effusion on right   Acute on chronic systolic CHF (congestive heart failure) (HCC)   Paroxysmal atrial fibrillation (HCC)   Uncontrolled type 2 diabetes  mellitus with hyperglycemia, with long-term current use of insulin  (HCC)    Plan Chronic systolic congestive heart failure ejection fraction less than 20 to 25% Chronic pleural effusion requiring thoracentesis status post recurrent treatment for pleural effusion related to heart failure Atrial fibrillation long-term persistent continue rate control management heart failure History of CRT-P continue have the patient follow-up with device clinic Hypertension continue hypertension management control for heart failure management as Shortness of breath continue diuretic therapy supplemental oxygen as needed continue thoracentesis Lower extremity edema continue IV diuresis transition to p.o. Diabetes management to control agree with diabetes management and control Chronic renal insufficiency have the patient follow-up with nephrology Continue current therapy   [name], MD 01/30/2024 9:03 AM

## 2024-02-01 LAB — BODY FLUID CULTURE W GRAM STAIN: Culture: NO GROWTH

## 2024-02-01 LAB — CYTOLOGY - NON PAP

## 2024-02-03 LAB — COMP PANEL: LEUKEMIA/LYMPHOMA

## 2024-02-09 ENCOUNTER — Inpatient Hospital Stay: Admission: EM | Admit: 2024-02-09 | Discharge: 2024-02-25 | DRG: 208 | Disposition: A

## 2024-02-09 ENCOUNTER — Other Ambulatory Visit: Payer: Self-pay

## 2024-02-09 ENCOUNTER — Emergency Department

## 2024-02-09 DIAGNOSIS — I272 Pulmonary hypertension, unspecified: Secondary | ICD-10-CM | POA: Diagnosis present

## 2024-02-09 DIAGNOSIS — I132 Hypertensive heart and chronic kidney disease with heart failure and with stage 5 chronic kidney disease, or end stage renal disease: Secondary | ICD-10-CM | POA: Diagnosis present

## 2024-02-09 DIAGNOSIS — L89151 Pressure ulcer of sacral region, stage 1: Secondary | ICD-10-CM | POA: Diagnosis not present

## 2024-02-09 DIAGNOSIS — I2699 Other pulmonary embolism without acute cor pulmonale: Secondary | ICD-10-CM | POA: Diagnosis present

## 2024-02-09 DIAGNOSIS — Z1152 Encounter for screening for COVID-19: Secondary | ICD-10-CM

## 2024-02-09 DIAGNOSIS — E876 Hypokalemia: Secondary | ICD-10-CM | POA: Diagnosis present

## 2024-02-09 DIAGNOSIS — Z7901 Long term (current) use of anticoagulants: Secondary | ICD-10-CM

## 2024-02-09 DIAGNOSIS — J90 Pleural effusion, not elsewhere classified: Secondary | ICD-10-CM | POA: Diagnosis not present

## 2024-02-09 DIAGNOSIS — E871 Hypo-osmolality and hyponatremia: Secondary | ICD-10-CM | POA: Diagnosis present

## 2024-02-09 DIAGNOSIS — E1122 Type 2 diabetes mellitus with diabetic chronic kidney disease: Secondary | ICD-10-CM | POA: Diagnosis present

## 2024-02-09 DIAGNOSIS — I480 Paroxysmal atrial fibrillation: Secondary | ICD-10-CM | POA: Diagnosis present

## 2024-02-09 DIAGNOSIS — L8990 Pressure ulcer of unspecified site, unspecified stage: Secondary | ICD-10-CM | POA: Insufficient documentation

## 2024-02-09 DIAGNOSIS — I472 Ventricular tachycardia, unspecified: Secondary | ICD-10-CM | POA: Diagnosis present

## 2024-02-09 DIAGNOSIS — E875 Hyperkalemia: Secondary | ICD-10-CM | POA: Diagnosis not present

## 2024-02-09 DIAGNOSIS — N170 Acute kidney failure with tubular necrosis: Secondary | ICD-10-CM | POA: Diagnosis present

## 2024-02-09 DIAGNOSIS — I509 Heart failure, unspecified: Secondary | ICD-10-CM | POA: Diagnosis not present

## 2024-02-09 DIAGNOSIS — E119 Type 2 diabetes mellitus without complications: Secondary | ICD-10-CM | POA: Diagnosis not present

## 2024-02-09 DIAGNOSIS — N179 Acute kidney failure, unspecified: Secondary | ICD-10-CM | POA: Diagnosis present

## 2024-02-09 DIAGNOSIS — Z992 Dependence on renal dialysis: Secondary | ICD-10-CM | POA: Diagnosis not present

## 2024-02-09 DIAGNOSIS — N1831 Chronic kidney disease, stage 3a: Secondary | ICD-10-CM | POA: Diagnosis not present

## 2024-02-09 DIAGNOSIS — Z9889 Other specified postprocedural states: Secondary | ICD-10-CM

## 2024-02-09 DIAGNOSIS — R7989 Other specified abnormal findings of blood chemistry: Secondary | ICD-10-CM | POA: Insufficient documentation

## 2024-02-09 DIAGNOSIS — S2232XA Fracture of one rib, left side, initial encounter for closed fracture: Secondary | ICD-10-CM | POA: Diagnosis present

## 2024-02-09 DIAGNOSIS — R0602 Shortness of breath: Secondary | ICD-10-CM | POA: Diagnosis present

## 2024-02-09 DIAGNOSIS — I5023 Acute on chronic systolic (congestive) heart failure: Secondary | ICD-10-CM | POA: Diagnosis present

## 2024-02-09 DIAGNOSIS — I428 Other cardiomyopathies: Secondary | ICD-10-CM | POA: Diagnosis present

## 2024-02-09 DIAGNOSIS — J189 Pneumonia, unspecified organism: Secondary | ICD-10-CM | POA: Diagnosis not present

## 2024-02-09 DIAGNOSIS — R092 Respiratory arrest: Secondary | ICD-10-CM | POA: Diagnosis not present

## 2024-02-09 DIAGNOSIS — E8721 Acute metabolic acidosis: Secondary | ICD-10-CM | POA: Diagnosis present

## 2024-02-09 DIAGNOSIS — B9789 Other viral agents as the cause of diseases classified elsewhere: Secondary | ICD-10-CM | POA: Diagnosis present

## 2024-02-09 DIAGNOSIS — Z888 Allergy status to other drugs, medicaments and biological substances status: Secondary | ICD-10-CM

## 2024-02-09 DIAGNOSIS — Y848 Other medical procedures as the cause of abnormal reaction of the patient, or of later complication, without mention of misadventure at the time of the procedure: Secondary | ICD-10-CM | POA: Diagnosis present

## 2024-02-09 DIAGNOSIS — Z4901 Encounter for fitting and adjustment of extracorporeal dialysis catheter: Secondary | ICD-10-CM | POA: Diagnosis not present

## 2024-02-09 DIAGNOSIS — Z881 Allergy status to other antibiotic agents status: Secondary | ICD-10-CM

## 2024-02-09 DIAGNOSIS — N1832 Chronic kidney disease, stage 3b: Secondary | ICD-10-CM | POA: Insufficient documentation

## 2024-02-09 DIAGNOSIS — Z539 Procedure and treatment not carried out, unspecified reason: Secondary | ICD-10-CM | POA: Diagnosis not present

## 2024-02-09 DIAGNOSIS — I469 Cardiac arrest, cause unspecified: Secondary | ICD-10-CM | POA: Diagnosis present

## 2024-02-09 DIAGNOSIS — I4819 Other persistent atrial fibrillation: Secondary | ICD-10-CM | POA: Diagnosis present

## 2024-02-09 DIAGNOSIS — J9811 Atelectasis: Secondary | ICD-10-CM | POA: Diagnosis present

## 2024-02-09 DIAGNOSIS — L409 Psoriasis, unspecified: Secondary | ICD-10-CM | POA: Diagnosis present

## 2024-02-09 DIAGNOSIS — Z950 Presence of cardiac pacemaker: Secondary | ICD-10-CM | POA: Diagnosis not present

## 2024-02-09 DIAGNOSIS — E11649 Type 2 diabetes mellitus with hypoglycemia without coma: Secondary | ICD-10-CM | POA: Diagnosis present

## 2024-02-09 DIAGNOSIS — R590 Localized enlarged lymph nodes: Secondary | ICD-10-CM | POA: Diagnosis present

## 2024-02-09 DIAGNOSIS — I10 Essential (primary) hypertension: Secondary | ICD-10-CM | POA: Diagnosis not present

## 2024-02-09 DIAGNOSIS — R230 Cyanosis: Secondary | ICD-10-CM | POA: Diagnosis present

## 2024-02-09 DIAGNOSIS — I5022 Chronic systolic (congestive) heart failure: Secondary | ICD-10-CM | POA: Diagnosis not present

## 2024-02-09 DIAGNOSIS — J918 Pleural effusion in other conditions classified elsewhere: Secondary | ICD-10-CM | POA: Diagnosis present

## 2024-02-09 DIAGNOSIS — N186 End stage renal disease: Secondary | ICD-10-CM | POA: Diagnosis present

## 2024-02-09 DIAGNOSIS — R54 Age-related physical debility: Secondary | ICD-10-CM | POA: Diagnosis present

## 2024-02-09 DIAGNOSIS — Z515 Encounter for palliative care: Secondary | ICD-10-CM

## 2024-02-09 DIAGNOSIS — Z95810 Presence of automatic (implantable) cardiac defibrillator: Secondary | ICD-10-CM | POA: Diagnosis not present

## 2024-02-09 DIAGNOSIS — J9601 Acute respiratory failure with hypoxia: Secondary | ICD-10-CM | POA: Diagnosis present

## 2024-02-09 DIAGNOSIS — R34 Anuria and oliguria: Secondary | ICD-10-CM | POA: Diagnosis present

## 2024-02-09 DIAGNOSIS — Z8249 Family history of ischemic heart disease and other diseases of the circulatory system: Secondary | ICD-10-CM

## 2024-02-09 DIAGNOSIS — Z9049 Acquired absence of other specified parts of digestive tract: Secondary | ICD-10-CM

## 2024-02-09 DIAGNOSIS — I959 Hypotension, unspecified: Secondary | ICD-10-CM | POA: Diagnosis not present

## 2024-02-09 DIAGNOSIS — Z538 Procedure and treatment not carried out for other reasons: Secondary | ICD-10-CM | POA: Diagnosis not present

## 2024-02-09 DIAGNOSIS — E1165 Type 2 diabetes mellitus with hyperglycemia: Secondary | ICD-10-CM | POA: Diagnosis present

## 2024-02-09 DIAGNOSIS — Z9109 Other allergy status, other than to drugs and biological substances: Secondary | ICD-10-CM

## 2024-02-09 DIAGNOSIS — R0600 Dyspnea, unspecified: Principal | ICD-10-CM

## 2024-02-09 DIAGNOSIS — Z79899 Other long term (current) drug therapy: Secondary | ICD-10-CM

## 2024-02-09 DIAGNOSIS — R9431 Abnormal electrocardiogram [ECG] [EKG]: Secondary | ICD-10-CM | POA: Diagnosis present

## 2024-02-09 DIAGNOSIS — I447 Left bundle-branch block, unspecified: Secondary | ICD-10-CM | POA: Diagnosis present

## 2024-02-09 DIAGNOSIS — Z794 Long term (current) use of insulin: Secondary | ICD-10-CM

## 2024-02-09 DIAGNOSIS — Z90710 Acquired absence of both cervix and uterus: Secondary | ICD-10-CM

## 2024-02-09 DIAGNOSIS — M8580 Other specified disorders of bone density and structure, unspecified site: Secondary | ICD-10-CM | POA: Diagnosis present

## 2024-02-09 DIAGNOSIS — R579 Shock, unspecified: Secondary | ICD-10-CM | POA: Diagnosis not present

## 2024-02-09 DIAGNOSIS — Z833 Family history of diabetes mellitus: Secondary | ICD-10-CM

## 2024-02-09 DIAGNOSIS — Z882 Allergy status to sulfonamides status: Secondary | ICD-10-CM

## 2024-02-09 DIAGNOSIS — M109 Gout, unspecified: Secondary | ICD-10-CM | POA: Diagnosis present

## 2024-02-09 LAB — LACTIC ACID, PLASMA: Lactic Acid, Venous: 0.7 mmol/L (ref 0.5–1.9)

## 2024-02-09 LAB — BASIC METABOLIC PANEL WITH GFR
Anion gap: 9 (ref 5–15)
BUN: 35 mg/dL — ABNORMAL HIGH (ref 8–23)
CO2: 21 mmol/L — ABNORMAL LOW (ref 22–32)
Calcium: 8.7 mg/dL — ABNORMAL LOW (ref 8.9–10.3)
Chloride: 102 mmol/L (ref 98–111)
Creatinine, Ser: 1.24 mg/dL — ABNORMAL HIGH (ref 0.44–1.00)
GFR, Estimated: 45 mL/min — ABNORMAL LOW (ref >60)
Glucose, Bld: 162 mg/dL — ABNORMAL HIGH (ref 70–99)
Potassium: 5.9 mmol/L — ABNORMAL HIGH (ref 3.5–5.1)
Sodium: 132 mmol/L — ABNORMAL LOW (ref 135–145)

## 2024-02-09 LAB — CBC
HCT: 43.7 % (ref 36.0–46.0)
Hemoglobin: 14 g/dL (ref 12.0–15.0)
MCH: 28.4 pg (ref 26.0–34.0)
MCHC: 32 g/dL (ref 30.0–36.0)
MCV: 88.6 fL (ref 80.0–100.0)
Platelets: 220 K/uL (ref 150–400)
RBC: 4.93 MIL/uL (ref 3.87–5.11)
RDW: 16.1 % — ABNORMAL HIGH (ref 11.5–15.5)
WBC: 5.7 K/uL (ref 4.0–10.5)
nRBC: 0 % (ref 0.0–0.2)

## 2024-02-09 LAB — CBG MONITORING, ED
Glucose-Capillary: 122 mg/dL — ABNORMAL HIGH (ref 70–99)
Glucose-Capillary: 161 mg/dL — ABNORMAL HIGH (ref 70–99)

## 2024-02-09 LAB — TROPONIN T, HIGH SENSITIVITY: Troponin T High Sensitivity: 49 ng/L — ABNORMAL HIGH (ref 0–19)

## 2024-02-09 MED ORDER — ONDANSETRON HCL 4 MG/2ML IJ SOLN
4.0000 mg | Freq: Four times a day (QID) | INTRAMUSCULAR | Status: DC | PRN
  Administered 2024-02-09: 4 mg via INTRAVENOUS
  Filled 2024-02-09: qty 2

## 2024-02-09 MED ORDER — GUAIFENESIN ER 600 MG PO TB12: 600.0000 mg | ORAL_TABLET | Freq: Two times a day (BID) | ORAL | Status: DC

## 2024-02-09 MED ORDER — TRAZODONE HCL 50 MG PO TABS: 25.0000 mg | ORAL_TABLET | Freq: Every evening | ORAL | Status: DC | PRN

## 2024-02-09 MED ORDER — GABAPENTIN 100 MG PO CAPS: 100.0000 mg | ORAL_CAPSULE | Freq: Two times a day (BID) | ORAL | Status: DC

## 2024-02-09 MED ORDER — ONDANSETRON HCL 4 MG PO TABS: 4.0000 mg | ORAL_TABLET | Freq: Four times a day (QID) | ORAL | Status: DC | PRN

## 2024-02-09 MED ORDER — ROCURONIUM BROMIDE 10 MG/ML (PF) SYRINGE
PREFILLED_SYRINGE | INTRAVENOUS | Status: AC
  Filled 2024-02-09: qty 10

## 2024-02-09 MED ORDER — HYDROCODONE-ACETAMINOPHEN 5-325 MG PO TABS: 1.0000 | ORAL_TABLET | ORAL | Status: DC | PRN

## 2024-02-09 MED ORDER — SODIUM CHLORIDE 0.9% FLUSH
3.0000 mL | Freq: Two times a day (BID) | INTRAVENOUS | Status: DC
  Administered 2024-02-10 – 2024-02-24 (×31): 3 mL via INTRAVENOUS

## 2024-02-09 MED ORDER — CARVEDILOL 6.25 MG PO TABS: 12.5000 mg | ORAL_TABLET | Freq: Two times a day (BID) | ORAL | Status: DC

## 2024-02-09 MED ORDER — ETOMIDATE 2 MG/ML IV SOLN
INTRAVENOUS | Status: AC
  Filled 2024-02-09: qty 20

## 2024-02-09 MED ORDER — RIVAROXABAN 15 MG PO TABS: 15.0000 mg | ORAL_TABLET | Freq: Two times a day (BID) | ORAL | Status: DC

## 2024-02-09 MED ORDER — ACETAMINOPHEN 650 MG RE SUPP
650.0000 mg | Freq: Four times a day (QID) | RECTAL | Status: DC | PRN
Start: 2024-02-09 — End: 2024-02-11

## 2024-02-09 MED ORDER — ISOSORBIDE MONONITRATE ER 60 MG PO TB24: 30.0000 mg | ORAL_TABLET | Freq: Every day | ORAL | Status: DC

## 2024-02-09 MED ORDER — INSULIN ASPART 100 UNIT/ML IJ SOLN: 0.0000 [IU] | Freq: Every day | INTRAMUSCULAR | Status: DC

## 2024-02-09 MED ORDER — MIDAZOLAM HCL 2 MG/2ML IJ SOLN
INTRAMUSCULAR | Status: AC
  Filled 2024-02-09: qty 2

## 2024-02-09 MED ORDER — HYDRALAZINE HCL 20 MG/ML IJ SOLN: 5.0000 mg | Freq: Four times a day (QID) | INTRAMUSCULAR | Status: DC | PRN

## 2024-02-09 MED ORDER — SODIUM CHLORIDE 0.9 % IV SOLN
500.0000 mg | Freq: Once | INTRAVENOUS | Status: AC
  Administered 2024-02-09: 500 mg via INTRAVENOUS
  Filled 2024-02-09: qty 5

## 2024-02-09 MED ORDER — INSULIN ASPART 100 UNIT/ML IJ SOLN: 0.0000 [IU] | Freq: Three times a day (TID) | INTRAMUSCULAR | Status: DC

## 2024-02-09 MED ORDER — SPIRONOLACTONE 12.5 MG HALF TABLET: 12.5000 mg | ORAL_TABLET | Freq: Every day | ORAL | Status: DC

## 2024-02-09 MED ORDER — SODIUM CHLORIDE 0.9 % IV SOLN
1.0000 g | Freq: Once | INTRAVENOUS | Status: AC
  Administered 2024-02-09: 1 g via INTRAVENOUS
  Filled 2024-02-09: qty 10

## 2024-02-09 MED ORDER — ACETAMINOPHEN 325 MG PO TABS
650.0000 mg | ORAL_TABLET | Freq: Four times a day (QID) | ORAL | Status: DC | PRN
Start: 2024-02-09 — End: 2024-02-11
  Administered 2024-02-10: 650 mg via ORAL
  Filled 2024-02-09: qty 2

## 2024-02-09 MED ORDER — INSULIN GLARGINE-YFGN 100 UNIT/ML ~~LOC~~ SOLN
20.0000 [IU] | Freq: Every day | SUBCUTANEOUS | Status: DC
  Filled 2024-02-09: qty 0.2

## 2024-02-09 MED ORDER — MORPHINE SULFATE (PF) 2 MG/ML IV SOLN: 1.0000 mg | Freq: Four times a day (QID) | INTRAVENOUS | Status: DC | PRN

## 2024-02-09 MED ORDER — SENNOSIDES-DOCUSATE SODIUM 8.6-50 MG PO TABS: 1.0000 | ORAL_TABLET | Freq: Every evening | ORAL | Status: DC | PRN

## 2024-02-09 MED ORDER — BISACODYL 5 MG PO TBEC: 5.0000 mg | DELAYED_RELEASE_TABLET | Freq: Every day | ORAL | Status: DC | PRN

## 2024-02-09 MED ORDER — IOHEXOL 350 MG/ML SOLN
75.0000 mL | Freq: Once | INTRAVENOUS | Status: AC | PRN
  Administered 2024-02-09: 75 mL via INTRAVENOUS

## 2024-02-09 MED ORDER — IPRATROPIUM-ALBUTEROL 0.5-2.5 (3) MG/3ML IN SOLN
3.0000 mL | Freq: Four times a day (QID) | RESPIRATORY_TRACT | Status: DC | PRN
  Administered 2024-02-21 – 2024-02-22 (×2): 3 mL via RESPIRATORY_TRACT
  Filled 2024-02-09 (×3): qty 3

## 2024-02-09 NOTE — ED Provider Notes (Signed)
 [facility] Provider Note    Event Date/Time   First MD Initiated Contact with Patient 02/09/24 1732     (approximate)  History   Chief Complaint: Shortness of Breath  HPI  Deborah Dawson is a 73 y.o. female with a past medical history of atrial fibrillation, CHF, diabetes, hypertension, presents to the emergency department for worsening shortness of breath.  According to the patient and record review patient was discharged from the hospital 01/30/2024 after an admission for shortness of breath.  During that admission patient had a right sided pleural effusion which was new per patient, drained twice per patient they drained 900 cc the first time and 700 cc the second time.  Patient states she was feeling somewhat better after going home but within about 5 days she began feeling worsening shortness of breath once again that has continued to worsen.  I reviewed the patient's workup during her hospitalization and she was also found to have a small blood clot she was on Eliquis at the time and the patient was switched to Xarelto.  Physical Exam   Triage Vital Signs: ED Triage Vitals  Encounter Vitals Group     BP 02/09/24 1514 (!) 162/106     Girls Systolic BP Percentile --      Girls Diastolic BP Percentile --      Boys Systolic BP Percentile --      Boys Diastolic BP Percentile --      Pulse Rate 02/09/24 1514 71     Resp 02/09/24 1514 (!) 24     Temp 02/09/24 1514 97.7 F (36.5 C)     Temp Source 02/09/24 1514 Oral     SpO2 02/09/24 1514 95 %     Weight 02/09/24 1515 147 lb (66.7 kg)     Height 02/09/24 1515 5' 1 (1.549 m)     Head Circumference --      Peak Flow --      Pain Score 02/09/24 1515 2     Pain Loc --      Pain Education --      Exclude from Growth Chart --     Most recent vital signs: Vitals:   02/09/24 1514 02/09/24 1700  BP: (!) 162/106 (!) 147/92  Pulse: 71 71  Resp: (!) 24 19  Temp: 97.7 F (36.5 C)   SpO2: 95% 96%     General: Awake, no distress.  CV:  Good peripheral perfusion.  Regular rate and rhythm  Resp:  Normal effort.  Equal breath sounds bilaterally.  Abd:  No distention.  Soft, nontender.  No rebound or guarding.  ED Results / Procedures / Treatments   EKG  EKG viewed and interpreted by myself shows a ventricular paced rhythm at 69 bpm no concerning ST changes.  RADIOLOGY  I have reviewed interpret the chest x-ray images.  Patient does appear to have a likely right-sided pleural effusion.  Pacemaker in place. Radiology confirms a right small pleural effusion with cardiomegaly. CTA of the chest is negative for PE but does show diffuse bronchial wall thickening with mucous plugging in the right greater than left concerning for bilateral pneumonia.  MEDICATIONS ORDERED IN ED: Medications  iohexol  (OMNIPAQUE ) 350 MG/ML injection 75 mL (has no administration in time range)     IMPRESSION / MDM / ASSESSMENT AND PLAN / ED COURSE  I reviewed the triage vital signs and the nursing notes.  Patient's presentation is most consistent with acute presentation with  potential threat to life or bodily function.  Patient presents emergency department for worsening shortness of breath.  Patient states this feels similar to when she needed her pleural effusion drained 2 weeks ago.  Patient also found to have a PE at that time during her admission.  Was switched from Eliquis to Xarelto.  Patient states over the last week or so her shortness of breath has been progressively worsening.  Patient's lab work today shows a largely reassuring CBC with a normal white blood cell count, no concerning findings on chemistry with normal renal function.  Chest x-ray does show a small right pleural effusion.  Given the patient's worsening shortness of breath with recent thoracentesis and PE we will proceed with a CTA of the chest to rule out PE or significant pleural effusion.  We will add on a cardiac enzyme and continue  to closely monitor.  Patient's workup today shows a reassuring CBC with a normal white blood cell count overall reassuring chemistry troponin is slightly elevated at 49.  Patient has significant work of breathing and gets short of breath with minimal exertion.  Patient CTA is negative for PE but does show what appears to be bilateral pneumonia.  Given the patient's increased work of breathing shortness of breath especially with any type of exertion we will admit to the hospital service Place patient on IV antibiotics.  We will send blood cultures and a lactic acid.  FINAL CLINICAL IMPRESSION(S) / ED DIAGNOSES   Dyspnea Pleural effusion Community-acquired pneumonia  Note:  This document was prepared using Dragon voice recognition software and may include unintentional dictation errors.   Dorothyann Drivers, MD 02/09/24 2109

## 2024-02-09 NOTE — ED Notes (Signed)
 Patient given a snack and ice water.

## 2024-02-09 NOTE — ED Notes (Signed)
 Patient placed on 2L [place] for comfort.

## 2024-02-09 NOTE — ED Triage Notes (Signed)
 Pt c/o dyspnea. Pt seen at [facility] yesterday for CXR and saw right pleural effusion. Pt reports dyspnea is worsening. Pt had thoracentesis earlier this month for same.

## 2024-02-09 NOTE — H&P (Signed)
 History and Physical   [facility] [place] @ [facility] Admission History and Physical [employer], D.O.    Patient Name: Deborah Dawson MR#: [id number] Date of Birth: [birth year] Date of Admission: 02/09/2024  Referring MD/NP/PA: Dr. Dorothyann Primary Care Physician: Sadie Manna, MD  Chief Complaint:  Chief Complaint  Patient presents with   Shortness of Breath    HPI: Deborah Dawson is a 73 y.o. 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    Patient was in a usual state of health until earlier this month from 66 2 through [age] she was admitted to the hospital for acute on chronic systolic CHF with right-sided pleural effusion  for which she underwent a thoracentesis, on 11/4 to remove 900 cc and again on 11/7 to remove 700 cc.  At that time she also was found to have a small PE for which she was switched from Eliquis to Xarelto.  She was discharged home and followed up yesterday [facility] with sent for chest x-ray for worsening shortness of breath that developed about 5 days after leaving the hospital.  She reports that she has a chronic cough which has been productive of clear to yellow sputum in the last day.    Otherwise there has been no change in status. Patient has been taking medication as prescribed and there has been no recent change in medication or diet.  No recent antibiotics.  There has been no recent illness, travel or sick contacts.    EMS/ED Course: Patient received Rocephin , azithromycin . Medical admission has been requested for further management of community-acquired pneumonia.  Review of Systems:  CONSTITUTIONAL: No fever/chills, fatigue, weakness, weight gain/loss, headache. EYES: No blurry or double vision. ENT: No tinnitus, postnasal drip, redness or soreness of the  oropharynx. RESPIRATORY: Positive cough, dyspnea, wheeze.  No hemoptysis.  CARDIOVASCULAR: No chest pain, palpitations, syncope, orthopnea. No lower extremity edema.  GASTROINTESTINAL: No nausea, vomiting, abdominal pain, diarrhea, constipation.  No hematemesis, melena or hematochezia. GENITOURINARY: No dysuria, frequency, hematuria. ENDOCRINE: No polyuria or nocturia. No heat or cold intolerance. HEMATOLOGY: No anemia, bruising, bleeding. INTEGUMENTARY: No rashes, ulcers, lesions. MUSCULOSKELETAL: No arthritis, gout. NEUROLOGIC: No numbness, tingling, ataxia, seizure-type activity, weakness. PSYCHIATRIC: No anxiety, depression, insomnia.   Past Medical History:  Diagnosis Date   Allergic genetic state    Atrial fibrillation (HCC) 2010   Cardiomyopathy (HCC)    CHF (congestive heart failure) (HCC)    Diabetes mellitus without complication (HCC)    Dysrhythmia    Gout    H/O cardiac catheterization    Hypertension    Joint pain    Osteopenia    Presence of permanent cardiac pacemaker    Psoriasis     Past Surgical History:  Procedure Laterality Date   ABDOMINAL HYSTERECTOMY  1985   BREAST EXCISIONAL BIOPSY Left    negative years ago   BREAST SURGERY Left 1997   papilloma   CARDIAC CATHETERIZATION Left 08/01/2015   Procedure: Left Heart Cath and Coronary Angiography;  Surgeon: Vinie DELENA Jude, MD;  Location: [facility] INVASIVE [facility];  Service: Cardiovascular;  Laterality: Left;   CHOLECYSTECTOMY  1986   COLONOSCOPY WITH PROPOFOL  N/A 08/26/2017   Procedure: COLONOSCOPY WITH PROPOFOL ;  Surgeon: Toledo, Ladell POUR, MD;  Location: [facility] ENDOSCOPY;  Service: Gastroenterology;  Laterality: N/A;   ESOPHAGOGASTRODUODENOSCOPY (EGD) WITH  PROPOFOL  N/A 08/26/2017   Procedure: ESOPHAGOGASTRODUODENOSCOPY (EGD) WITH PROPOFOL ;  Surgeon: Toledo, Ladell POUR, MD;  Location: [facility] ENDOSCOPY;  Service: Gastroenterology;  Laterality: N/A;   ESOPHAGOGASTRODUODENOSCOPY (EGD) WITH PROPOFOL  N/A 07/04/2019    Procedure: ESOPHAGOGASTRODUODENOSCOPY (EGD) WITH PROPOFOL ;  Surgeon: Toledo, Ladell POUR, MD;  Location: [facility] ENDOSCOPY;  Service: Gastroenterology;  Laterality: N/A;   ESOPHAGOGASTRODUODENOSCOPY (EGD) WITH PROPOFOL  N/A 06/12/2023   Procedure: ESOPHAGOGASTRODUODENOSCOPY (EGD) WITH PROPOFOL ;  Surgeon: Maryruth Ole DASEN, MD;  Location: [facility] ENDOSCOPY;  Service: Endoscopy;  Laterality: N/A;  DM   TUBAL LIGATION  1981     reports that she has never smoked. She has never used smokeless tobacco. She reports that she does not drink alcohol and does not use drugs.  Allergies  Allergen Reactions   Doxazosin Other (See Comments)    Cardura - cough   Doxycycline      Abdominal pain   Drug Class [Clindamycin/Lincomycin] Hives   Hydralazine Hcl Other (See Comments)    gout   Metformin  And Related Swelling   Ciprofloxacin Other (See Comments)    Numbness in face   Iodine Other (See Comments)    NOT CT CONTRAST PER PT   Pioglitazone Other (See Comments)   Sacubitril-Valsartan Cough    Pt states that it gave her a cough and chest palpitations   Semaglutide Nausea Only   Sulfamethoxazole-Trimethoprim Hives   Augmentin [Amoxicillin-Pot Clavulanate] Diarrhea   Dulaglutide Nausea Only   Hctz [Hydrochlorothiazide] Other (See Comments)    Gout   Losartan Diarrhea   Poison Oak Extract Rash   Red Dye #40 (Allura Red) Rash    Family History  Problem Relation Age of Onset   Cancer Mother 90       breast   Diabetes Mother    Breast cancer Mother 57   Coronary artery disease Mother    Stroke Father    Coronary artery disease Father    Diabetes Son     Prior to Admission medications   Medication Sig Start Date End Date Taking? Authorizing Provider  carvedilol  (COREG ) 12.5 MG tablet Take 1 tablet (12.5 mg total) by mouth 2 (two) times daily with a meal. 01/30/24   Josette, Richard, MD  fluticasone (FLONASE) 50 MCG/ACT nasal spray Place 1 spray into both nostrils 2 (two) times daily. Patient not  taking: Reported on 01/24/2024 07/31/20   [provider]  gabapentin (NEURONTIN) 100 MG capsule Take 100 mg by mouth 2 (two) times daily. 09/28/23   [provider]  insulin  glargine (LANTUS  SOLOSTAR) 100 UNIT/ML Solostar Pen Inject 20 Units into the skin at bedtime. 01/30/24 01/29/25  Josette Ade, MD  isosorbide mononitrate (IMDUR) 30 MG 24 hr tablet Take 1 tablet (30 mg total) by mouth daily. 01/30/24   Josette Ade, MD  Magnesium  Gluconate 550 MG TABS Take by mouth.    [provider]  RIVAROXABAN TAD) VTE STARTER PACK (15 & 20 MG) Follow package directions: Take one 15mg  tablet by mouth twice a day. On day 22, switch to one 20mg  tablet once a day. Take with food. 01/30/24   Josette Ade, MD  spironolactone (ALDACTONE) 25 MG tablet Take 1/2 tablet (12.5 mg total) by mouth daily. 01/30/24   Josette Ade, MD  Vitamin D, Ergocalciferol, 2000 units CAPS Take 1 capsule by mouth daily.    [provider]    Physical Exam: Vitals:   02/09/24 1515 02/09/24 1700 02/09/24 2000 02/09/24 2132  BP:  (!) 147/92 (!) 160/99   Pulse:  71  79   Resp:  19 19   Temp:    98.1 F (36.7 C)  TempSrc:    Oral  SpO2:  96% 98%   Weight: 66.7 kg     Height: 5' 1 (1.549 m)       GENERAL: 73 y.o.-year-old white female patient, well-developed, well-nourished lying in the bed in no acute distress.  Pleasant and cooperative.  Globally weak HEENT: Head atraumatic, normocephalic. Pupils equal. Mucus membranes moist. NECK: Supple. No JVD. CHEST: Normal breath sounds bilaterally. No wheezing, rales, rhonchi or crackles. No use of accessory muscles of respiration.  No reproducible chest wall tenderness.  CARDIOVASCULAR: S1, S2 normal. No murmurs, rubs, or gallops. Cap refill <2 seconds. Pulses intact distally.  ABDOMEN: Soft, nondistended, nontender. No rebound, guarding, rigidity. Normoactive bowel sounds present in all four quadrants.  EXTREMITIES: No pedal edema,  cyanosis, or clubbing. No calf tenderness or Homan's sign.  NEUROLOGIC: The patient is alert and oriented x 3. Cranial nerves II through XII are grossly intact with no focal sensorimotor deficit. PSYCHIATRIC:  Normal affect, mood, thought content. SKIN: Warm, dry, and intact without obvious rash, lesion, or ulcer.    Labs on Admission:  CBC: Recent Labs  Lab 02/09/24 1640  WBC 5.7  HGB 14.0  HCT 43.7  MCV 88.6  PLT 220   Basic Metabolic Panel: Recent Labs  Lab 02/09/24 1640  NA 132*  K 5.9*  CL 102  CO2 21*  GLUCOSE 162*  BUN 35*  CREATININE 1.24*  CALCIUM 8.7*   GFR: Estimated Creatinine Clearance: 34.3 mL/min (A) (by C-G formula based on SCr of 1.24 mg/dL (H)). Liver Function Tests: No results for input(s): AST, ALT, ALKPHOS, BILITOT, PROT, ALBUMIN in the last 168 hours. No results for input(s): LIPASE, AMYLASE in the last 168 hours. No results for input(s): AMMONIA in the last 168 hours. Coagulation Profile: No results for input(s): INR, PROTIME in the last 168 hours. Cardiac Enzymes: No results for input(s): CKTOTAL, CKMB, CKMBINDEX, TROPONINI in the last 168 hours. BNP (last 3 results) No results for input(s): PROBNP in the last 8760 hours. HbA1C: No results for input(s): HGBA1C in the last 72 hours. CBG: No results for input(s): GLUCAP in the last 168 hours. Lipid Profile: No results for input(s): CHOL, HDL, LDLCALC, TRIG, CHOLHDL, LDLDIRECT in the last 72 hours. Thyroid Function Tests: No results for input(s): TSH, T4TOTAL, FREET4, T3FREE, THYROIDAB in the last 72 hours. Anemia Panel: No results for input(s): VITAMINB12, FOLATE, FERRITIN, TIBC, IRON, RETICCTPCT in the last 72 hours. Urine analysis:    Component Value Date/Time   APPEARANCEUR Hazy (A) 04/19/2020 1411   GLUCOSEU 1+ (A) 04/19/2020 1411   BILIRUBINUR Negative 04/19/2020 1411   PROTEINUR 1+ (A) 04/19/2020 1411   NITRITE  Negative 04/19/2020 1411   LEUKOCYTESUR 1+ (A) 04/19/2020 1411   Sepsis Labs: @LABRCNTIP (procalcitonin:4,lacticidven:4) )No results found for this or any previous visit (from the past 240 hours).   Radiological Exams on Admission: CT Angio Chest PE W and/or Wo Contrast Result Date: 02/09/2024 EXAM: CTA CHEST 02/09/2024 06:29:20 PM TECHNIQUE: CTA of the chest was performed without and with the administration of 75 mL of iohexol  (OMNIPAQUE ) 350 MG/ML injection. Multiplanar reformatted images are provided for review. MIP images are provided for review. Automated exposure control, iterative reconstruction, and/or weight based adjustment of the mA/kV was utilized to reduce the radiation dose to as low as reasonably achievable. COMPARISON: None available. CLINICAL HISTORY: Pulmonary embolism (PE) suspected, low to intermediate prob, positive D-dimer.  FINDINGS: PULMONARY ARTERIES: Pulmonary arteries are adequately opacified for evaluation. No acute pulmonary embolus. Main pulmonary artery is normal in caliber. MEDIASTINUM: The heart and pericardium demonstrate no acute abnormality. There is no acute abnormality of the thoracic aorta. LYMPH NODES: Right hilar lymphadenopathy with, as an example, a 1.2 cm lymph node (5:54). Left hilar lymphadenopathy with, as an example, a 1.2 cm lymph node (5 x 15 x 4 mm). Mediastinal lymphadenopathy with a right paratracheal 1.1 cm lymph node (5.32). No axillary lymphadenopathy. LUNGS AND PLEURA: At least a small volume right pleural effusion. No left pleural effusion. No pneumothorax. Basal atelectasis of the right lower lobe. Diffuse bronchial wall thickening with mucous plugging within the right lower lobe. Mucous plugging within the left lower lobe to a lesser extent. Question of developing small consolidation within the right lower lobe (6:88) with associated bronchial plugging. The trachea and mainstem bronchi are patent. Expiratory phase respiration. UPPER ABDOMEN: Limited  images of the upper abdomen are unremarkable. SOFT TISSUES AND BONES: Left chest wall 2 lead cardiac device. No acute bone or soft tissue abnormality. IMPRESSION: 1. No pulmonary embolism. 2. Diffuse bronchial wall thickening with mucous plugging, greater in the right lower lobe, with probable developing small right lower lobe consolidation. 3. Small right pleural effusion. 4. Right hilar, left hilar, and mediastinal lymphadenopathy. Likely reactive in etiology. Recommend attention on follow-up. Electronically signed by: Morgane Naveau MD 02/09/2024 07:12 PM EST RP Workstation: [id number]   DG Chest 2 View Result Date: 02/09/2024 CLINICAL DATA:  Shortness of breath and pleural effusion. EXAM: CHEST - 2 VIEW COMPARISON:  Chest x-ray 01/29/2024.  Chest CT 01/24/2024. FINDINGS: There is a small right pleural effusion. Left lung is clear. No pneumothorax or focal lung infiltrate. The cardiac silhouette is enlarged. Left-sided pacemaker is again seen. No acute fractures are identified. IMPRESSION: 1. Small right pleural effusion. 2. Cardiomegaly. Electronically Signed   By: Greig Pique M.D.   On: 02/09/2024 15:56    EKG: Ventricular paced at 69/min  Assessment/Plan  This is a 73 y.o. female with a history of PAF, PE on Xarelto, chronic HFrEF LVEF 25% with recurrent right-sided pleural effusion, PPM-ICD, HTN, IDDM, gout   now being admitted with:  #. Community Acquired Pneumonia - Admit to inpatient - IV Rocephin  & Azithromycin  per pharmacy - IV fluid hydration - Duonebs, expectorants & O2 therapy as needed - Follow up blood & sputum cultures -Check respiratory path panel, strep and Legionella as well as MRSA PCR  #. Recurrent small pleural effusion -Consider IR if not improving  #. Mild hyperkalemia -Monitor BMP, repeat at midnight and in morning  #.  History of PAF - Continue carvedilol , Xarelto  #. History of HFrEF - Continue carvedilol , isosorbide, spironolactone  #. History of  diabetes - Continue Lantus  Accu-Cheks ACHS with regular insulin  sliding scale coverage   Admission status: Inpatient IV Fluids: Hep-Lock Diet/Nutrition: Heart healthy carb controlled Consults called: None DVT Px: Lovenox, SCDs and early ambulation. Code Status: Full Code  Disposition Plan: To home in 1-2 days  All the records are reviewed and case discussed with ED provider. Management plans discussed with the patient and/or family who express understanding and agree with plan of care.  Garnell Begeman D.O. on 02/09/2024 at 9:48 PM CC: Primary care physician; Sadie Manna, MD   02/09/2024, 9:48 PM

## 2024-02-10 ENCOUNTER — Inpatient Hospital Stay

## 2024-02-10 ENCOUNTER — Other Ambulatory Visit: Payer: Self-pay

## 2024-02-10 ENCOUNTER — Inpatient Hospital Stay: Admit: 2024-02-10 | Discharge: 2024-02-10 | Disposition: A | Attending: Pulmonary Disease

## 2024-02-10 DIAGNOSIS — E119 Type 2 diabetes mellitus without complications: Secondary | ICD-10-CM

## 2024-02-10 DIAGNOSIS — N1831 Chronic kidney disease, stage 3a: Secondary | ICD-10-CM

## 2024-02-10 DIAGNOSIS — I5022 Chronic systolic (congestive) heart failure: Secondary | ICD-10-CM | POA: Diagnosis not present

## 2024-02-10 DIAGNOSIS — I10 Essential (primary) hypertension: Secondary | ICD-10-CM

## 2024-02-10 DIAGNOSIS — E875 Hyperkalemia: Secondary | ICD-10-CM | POA: Diagnosis not present

## 2024-02-10 DIAGNOSIS — I480 Paroxysmal atrial fibrillation: Secondary | ICD-10-CM

## 2024-02-10 DIAGNOSIS — J189 Pneumonia, unspecified organism: Secondary | ICD-10-CM | POA: Diagnosis not present

## 2024-02-10 DIAGNOSIS — I469 Cardiac arrest, cause unspecified: Secondary | ICD-10-CM | POA: Diagnosis present

## 2024-02-10 DIAGNOSIS — J9601 Acute respiratory failure with hypoxia: Secondary | ICD-10-CM

## 2024-02-10 DIAGNOSIS — J90 Pleural effusion, not elsewhere classified: Secondary | ICD-10-CM

## 2024-02-10 LAB — MRSA NEXT GEN BY PCR, NASAL: MRSA by PCR Next Gen: NOT DETECTED

## 2024-02-10 LAB — CBC
HCT: 42.9 % (ref 36.0–46.0)
Hemoglobin: 13.7 g/dL (ref 12.0–15.0)
MCH: 28.8 pg (ref 26.0–34.0)
MCHC: 31.9 g/dL (ref 30.0–36.0)
MCV: 90.1 fL (ref 80.0–100.0)
Platelets: 216 K/uL (ref 150–400)
RBC: 4.76 MIL/uL (ref 3.87–5.11)
RDW: 16.4 % — ABNORMAL HIGH (ref 11.5–15.5)
WBC: 16.3 K/uL — ABNORMAL HIGH (ref 4.0–10.5)
nRBC: 0 % (ref 0.0–0.2)

## 2024-02-10 LAB — GLUCOSE, CAPILLARY
Glucose-Capillary: 273 mg/dL — ABNORMAL HIGH (ref 70–99)
Glucose-Capillary: 235 mg/dL — ABNORMAL HIGH (ref 70–99)
Glucose-Capillary: 144 mg/dL — ABNORMAL HIGH (ref 70–99)
Glucose-Capillary: 114 mg/dL — ABNORMAL HIGH (ref 70–99)
Glucose-Capillary: 86 mg/dL (ref 70–99)
Glucose-Capillary: 129 mg/dL — ABNORMAL HIGH (ref 70–99)
Glucose-Capillary: 144 mg/dL — ABNORMAL HIGH (ref 70–99)
Glucose-Capillary: 126 mg/dL — ABNORMAL HIGH (ref 70–99)

## 2024-02-10 LAB — BASIC METABOLIC PANEL WITH GFR
Anion gap: 10 (ref 5–15)
BUN: 34 mg/dL — ABNORMAL HIGH (ref 8–23)
CO2: 21 mmol/L — ABNORMAL LOW (ref 22–32)
Calcium: 9.2 mg/dL (ref 8.9–10.3)
Chloride: 102 mmol/L (ref 98–111)
Creatinine, Ser: 1.44 mg/dL — ABNORMAL HIGH (ref 0.44–1.00)
GFR, Estimated: 38 mL/min — ABNORMAL LOW (ref >60)
Glucose, Bld: 270 mg/dL — ABNORMAL HIGH (ref 70–99)
Potassium: 4.4 mmol/L (ref 3.5–5.1)
Sodium: 134 mmol/L — ABNORMAL LOW (ref 135–145)

## 2024-02-10 LAB — URINE DRUG SCREEN
Amphetamines: NEGATIVE
Barbiturates: NEGATIVE
Benzodiazepines: NEGATIVE
Cocaine: NEGATIVE
Fentanyl: NEGATIVE
Methadone Scn, Ur: NEGATIVE
Opiates: NEGATIVE
Tetrahydrocannabinol: NEGATIVE

## 2024-02-10 LAB — RESP PANEL BY RT-PCR (RSV, FLU A&B, COVID)  RVPGX2
Influenza A by PCR: NEGATIVE
Influenza B by PCR: NEGATIVE
Resp Syncytial Virus by PCR: NEGATIVE
SARS Coronavirus 2 by RT PCR: NEGATIVE

## 2024-02-10 LAB — HEPATIC FUNCTION PANEL
ALT: 41 U/L (ref 0–44)
AST: 93 U/L — ABNORMAL HIGH (ref 15–41)
Albumin: 3.3 g/dL — ABNORMAL LOW (ref 3.5–5.0)
Alkaline Phosphatase: 99 U/L (ref 38–126)
Bilirubin, Direct: 0.5 mg/dL — ABNORMAL HIGH (ref 0.0–0.2)
Indirect Bilirubin: 0.3 mg/dL (ref 0.3–0.9)
Total Bilirubin: 0.8 mg/dL (ref 0.0–1.2)
Total Protein: 6.7 g/dL (ref 6.5–8.1)

## 2024-02-10 LAB — RESPIRATORY PANEL BY PCR

## 2024-02-10 LAB — BLOOD GAS, ARTERIAL
Acid-base deficit: 5.3 mmol/L — ABNORMAL HIGH (ref 0.0–2.0)
Bicarbonate: 21.2 mmol/L (ref 20.0–28.0)
FIO2: 75 %
MECHVT: 450 mL
Mechanical Rate: 16
O2 Saturation: 98.8 %
PEEP: 5 cmH2O
Patient temperature: 37
pCO2 arterial: 44 mmHg (ref 32–48)
pH, Arterial: 7.29 — ABNORMAL LOW (ref 7.35–7.45)
pO2, Arterial: 172 mmHg — ABNORMAL HIGH (ref 83–108)

## 2024-02-10 LAB — ECHOCARDIOGRAM COMPLETE
AR max vel: 1.61 cm2
AV Area VTI: 1.99 cm2
AV Area mean vel: 1.6 cm2
AV Mean grad: 2 mmHg
AV Peak grad: 2.9 mmHg
Ao pk vel: 0.86 m/s
Area-P 1/2: 5.42 cm2
Calc EF: 13.8 %
Height: 61 in
S' Lateral: 3.8 cm
Single Plane A2C EF: 20.1 %
Single Plane A4C EF: 1.9 %
Weight: 2486.79 [oz_av]

## 2024-02-10 LAB — TROPONIN T, HIGH SENSITIVITY
Troponin T High Sensitivity: 92 ng/L — ABNORMAL HIGH (ref 0–19)
Troponin T High Sensitivity: 122 ng/L (ref 0–19)
Troponin T High Sensitivity: 124 ng/L (ref 0–19)
Troponin T High Sensitivity: 127 ng/L (ref 0–19)

## 2024-02-10 LAB — URINALYSIS, COMPLETE (UACMP) WITH MICROSCOPIC
Bilirubin Urine: NEGATIVE
Glucose, UA: >=500 mg/dL — AB
Ketones, ur: NEGATIVE mg/dL
Leukocytes,Ua: NEGATIVE
Nitrite: NEGATIVE
Protein, ur: >=300 mg/dL — AB
Specific Gravity, Urine: 1.026 (ref 1.005–1.030)
Squamous Epithelial / HPF: 0 /HPF (ref 0–5)
pH: 5 (ref 5.0–8.0)

## 2024-02-10 LAB — MAGNESIUM: Magnesium: 2.7 mg/dL — ABNORMAL HIGH (ref 1.7–2.4)

## 2024-02-10 LAB — LACTIC ACID, PLASMA
Lactic Acid, Venous: 3.3 mmol/L (ref 0.5–1.9)
Lactic Acid, Venous: 2.9 mmol/L (ref 0.5–1.9)
Lactic Acid, Venous: 1.8 mmol/L (ref 0.5–1.9)

## 2024-02-10 LAB — BETA-HYDROXYBUTYRIC ACID: Beta-Hydroxybutyric Acid: 0.09 mmol/L (ref 0.05–0.27)

## 2024-02-10 LAB — HIV ANTIBODY (ROUTINE TESTING W REFLEX): HIV Screen 4th Generation wRfx: NONREACTIVE

## 2024-02-10 LAB — PRO BRAIN NATRIURETIC PEPTIDE: Pro Brain Natriuretic Peptide: 15914 pg/mL — ABNORMAL HIGH (ref <300.0)

## 2024-02-10 LAB — PROCALCITONIN
Procalcitonin: 0.11 ng/mL
Procalcitonin: 0.3 ng/mL

## 2024-02-10 LAB — PROTIME-INR
INR: 2.5 — ABNORMAL HIGH (ref 0.8–1.2)
Prothrombin Time: 28.2 s — ABNORMAL HIGH (ref 11.4–15.2)

## 2024-02-10 LAB — STREP PNEUMONIAE URINARY ANTIGEN: Strep Pneumo Urinary Antigen: NEGATIVE

## 2024-02-10 LAB — PHOSPHORUS: Phosphorus: 4 mg/dL (ref 2.5–4.6)

## 2024-02-10 MED ORDER — RIVAROXABAN 20 MG PO TABS: 20.0000 mg | ORAL_TABLET | Freq: Every day | ORAL | Status: DC

## 2024-02-10 MED ORDER — INSULIN ASPART 100 UNIT/ML IJ SOLN
0.0000 [IU] | INTRAMUSCULAR | Status: DC
  Administered 2024-02-10: 2 [IU] via SUBCUTANEOUS
  Administered 2024-02-10: 5 [IU] via SUBCUTANEOUS
  Administered 2024-02-10 – 2024-02-11 (×5): 2 [IU] via SUBCUTANEOUS
  Administered 2024-02-11: 3 [IU] via SUBCUTANEOUS
  Filled 2024-02-10: qty 5
  Filled 2024-02-10: qty 3
  Filled 2024-02-10 (×6): qty 2

## 2024-02-10 MED ORDER — SODIUM BICARBONATE 8.4 % IV SOLN
INTRAVENOUS | Status: AC | PRN
Start: 2024-02-10 — End: 2024-02-10
  Administered 2024-02-10: 50 meq via INTRAVENOUS

## 2024-02-10 MED ORDER — ALBUTEROL SULFATE (2.5 MG/3ML) 0.083% IN NEBU: 10.0000 mg | INHALATION_SOLUTION | Freq: Once | RESPIRATORY_TRACT | Status: DC

## 2024-02-10 MED ORDER — PANCRELIPASE (LIP-PROT-AMYL) 10440-39150 UNITS PO TABS
20880.0000 [IU] | ORAL_TABLET | Freq: Once | ORAL | Status: AC
  Administered 2024-02-10: 20880 [IU]
  Filled 2024-02-10: qty 2

## 2024-02-10 MED ORDER — CHLORHEXIDINE GLUCONATE CLOTH 2 % EX PADS
6.0000 | MEDICATED_PAD | Freq: Every day | CUTANEOUS | Status: DC
  Administered 2024-02-10 – 2024-02-22 (×13): 6 via TOPICAL

## 2024-02-10 MED ORDER — SODIUM ZIRCONIUM CYCLOSILICATE 5 G PO PACK
10.0000 g | PACK | Freq: Once | ORAL | Status: DC
  Filled 2024-02-10: qty 1

## 2024-02-10 MED ORDER — VITAL AF 1.2 CAL PO LIQD
1000.0000 mL | ORAL | Status: DC
  Administered 2024-02-10 – 2024-02-11 (×2): 1000 mL

## 2024-02-10 MED ORDER — FENTANYL CITRATE (PF) 50 MCG/ML IJ SOSY: 50.0000 ug | PREFILLED_SYRINGE | INTRAMUSCULAR | Status: DC | PRN

## 2024-02-10 MED ORDER — DEXMEDETOMIDINE HCL IN NACL 400 MCG/100ML IV SOLN
0.0000 ug/kg/h | INTRAVENOUS | Status: DC
Start: 2024-02-10 — End: 2024-02-11
  Administered 2024-02-10: 0.4 ug/kg/h via INTRAVENOUS
  Administered 2024-02-10 – 2024-02-11 (×2): 0.5 ug/kg/h via INTRAVENOUS
  Filled 2024-02-10 (×2): qty 100

## 2024-02-10 MED ORDER — DEXTROSE 50 % IV SOLN
1.0000 | Freq: Once | INTRAVENOUS | Status: DC
  Filled 2024-02-10: qty 50

## 2024-02-10 MED ORDER — POLYETHYLENE GLYCOL 3350 17 G PO PACK: 17.0000 g | PACK | Freq: Every day | ORAL | Status: DC | PRN

## 2024-02-10 MED ORDER — FAMOTIDINE 20 MG PO TABS
20.0000 mg | ORAL_TABLET | Freq: Two times a day (BID) | ORAL | Status: DC
  Administered 2024-02-10 (×3): 20 mg
  Filled 2024-02-10 (×3): qty 1

## 2024-02-10 MED ORDER — MAGNESIUM SULFATE 50 % IJ SOLN
INTRAMUSCULAR | Status: AC | PRN
  Administered 2024-02-10: 2 g via INTRAVENOUS

## 2024-02-10 MED ORDER — ROCURONIUM BROMIDE 10 MG/ML (PF) SYRINGE
PREFILLED_SYRINGE | INTRAVENOUS | Status: AC | PRN
  Administered 2024-02-10: 80 mg via INTRAVENOUS

## 2024-02-10 MED ORDER — FUROSEMIDE 10 MG/ML IJ SOLN
40.0000 mg | Freq: Once | INTRAMUSCULAR | Status: AC
  Administered 2024-02-10: 40 mg via INTRAVENOUS
  Filled 2024-02-10: qty 4

## 2024-02-10 MED ORDER — SODIUM CHLORIDE 0.9 % IV SOLN
3.0000 g | Freq: Four times a day (QID) | INTRAVENOUS | Status: DC
  Administered 2024-02-10 – 2024-02-11 (×5): 3 g via INTRAVENOUS
  Filled 2024-02-10 (×7): qty 8

## 2024-02-10 MED ORDER — POLYETHYLENE GLYCOL 3350 17 G PO PACK
17.0000 g | PACK | Freq: Every day | ORAL | Status: DC
  Administered 2024-02-10: 17 g
  Filled 2024-02-10 (×2): qty 1

## 2024-02-10 MED ORDER — EPINEPHRINE 1 MG/10ML IV SOSY
PREFILLED_SYRINGE | INTRAVENOUS | Status: AC | PRN
  Administered 2024-02-10: 1 mg via INTRAVENOUS

## 2024-02-10 MED ORDER — PROPOFOL 1000 MG/100ML IV EMUL
0.0000 ug/kg/min | INTRAVENOUS | Status: DC
  Administered 2024-02-10: 10 ug/kg/min via INTRAVENOUS
  Administered 2024-02-10: 20 ug/kg/min via INTRAVENOUS
  Filled 2024-02-10: qty 100

## 2024-02-10 MED ORDER — RIVAROXABAN 15 MG PO TABS: 15.0000 mg | ORAL_TABLET | Freq: Two times a day (BID) | ORAL | Status: DC

## 2024-02-10 MED ORDER — OXYCODONE HCL 5 MG PO TABS
5.0000 mg | ORAL_TABLET | Freq: Once | ORAL | Status: AC
  Administered 2024-02-10: 5 mg
  Filled 2024-02-10: qty 1

## 2024-02-10 MED ORDER — SODIUM CHLORIDE 0.9 % IV SOLN: 500.0000 mg | INTRAVENOUS | Status: DC

## 2024-02-10 MED ORDER — OXYCODONE HCL 5 MG PO TABS
5.0000 mg | ORAL_TABLET | Freq: Four times a day (QID) | ORAL | Status: DC
  Administered 2024-02-10 – 2024-02-11 (×3): 5 mg
  Filled 2024-02-10 (×3): qty 1

## 2024-02-10 MED ORDER — SODIUM BICARBONATE 650 MG PO TABS
650.0000 mg | ORAL_TABLET | Freq: Once | ORAL | Status: AC
  Administered 2024-02-10: 650 mg
  Filled 2024-02-10: qty 1

## 2024-02-10 MED ORDER — ETOMIDATE 2 MG/ML IV SOLN
INTRAVENOUS | Status: AC | PRN
  Administered 2024-02-10: 10 mg via INTRAVENOUS

## 2024-02-10 MED ORDER — LIDOCAINE 5 % EX PTCH
1.0000 | MEDICATED_PATCH | CUTANEOUS | Status: DC
  Administered 2024-02-10 – 2024-02-23 (×12): 1 via TRANSDERMAL
  Filled 2024-02-10 (×16): qty 1

## 2024-02-10 MED ORDER — INSULIN ASPART 100 UNIT/ML IJ SOLN
10.0000 [IU] | Freq: Once | INTRAMUSCULAR | Status: AC
  Administered 2024-02-10: 10 [IU] via INTRAVENOUS

## 2024-02-10 MED ORDER — FUROSEMIDE 10 MG/ML IJ SOLN
60.0000 mg | Freq: Two times a day (BID) | INTRAMUSCULAR | Status: DC
  Administered 2024-02-11: 60 mg via INTRAVENOUS
  Filled 2024-02-10: qty 6

## 2024-02-10 MED ORDER — DEXMEDETOMIDINE HCL IN NACL 400 MCG/100ML IV SOLN
INTRAVENOUS | Status: AC
  Filled 2024-02-10: qty 100

## 2024-02-10 MED ORDER — SODIUM POLYSTYRENE SULFONATE 15 GM/60ML CO SUSP
30.0000 g | Freq: Once | Status: DC
  Filled 2024-02-10: qty 120

## 2024-02-10 MED ORDER — CALCIUM CHLORIDE 10 % IV SOLN
INTRAVENOUS | Status: AC | PRN
  Administered 2024-02-10: 1 g via INTRAVENOUS

## 2024-02-10 MED ORDER — FUROSEMIDE 10 MG/ML IJ SOLN
60.0000 mg | Freq: Once | INTRAMUSCULAR | Status: AC
  Administered 2024-02-10: 60 mg via INTRAVENOUS
  Filled 2024-02-10: qty 6

## 2024-02-10 MED ORDER — INSULIN ASPART 100 UNIT/ML IV SOLN
10.0000 [IU] | Freq: Once | INTRAVENOUS | Status: DC
  Filled 2024-02-10: qty 0.1
  Filled 2024-02-10: qty 10

## 2024-02-10 MED ORDER — JUVEN PO PACK
1.0000 | PACK | Freq: Two times a day (BID) | ORAL | Status: DC
  Administered 2024-02-11: 1

## 2024-02-10 MED ORDER — HYDRALAZINE HCL 20 MG/ML IJ SOLN
10.0000 mg | INTRAMUSCULAR | Status: DC | PRN
  Administered 2024-02-10: 10 mg via INTRAVENOUS
  Filled 2024-02-10: qty 1

## 2024-02-10 MED ORDER — SODIUM BICARBONATE 8.4 % IV SOLN
50.0000 meq | Freq: Once | INTRAVENOUS | Status: AC
  Administered 2024-02-10: 50 meq via INTRAVENOUS

## 2024-02-10 MED ORDER — TRAZODONE HCL 50 MG PO TABS: 25.0000 mg | ORAL_TABLET | Freq: Every evening | ORAL | Status: DC | PRN

## 2024-02-10 MED ORDER — DEXTROSE 50 % IV SOLN
INTRAVENOUS | Status: AC | PRN
  Administered 2024-02-10: 25 g via INTRAVENOUS

## 2024-02-10 MED ORDER — INSULIN GLARGINE-YFGN 100 UNIT/ML ~~LOC~~ SOLN
8.0000 [IU] | Freq: Two times a day (BID) | SUBCUTANEOUS | Status: DC
  Administered 2024-02-10 – 2024-02-12 (×5): 8 [IU] via SUBCUTANEOUS
  Filled 2024-02-10 (×6): qty 0.08

## 2024-02-10 MED ORDER — CALCIUM GLUCONATE 10 % IV SOLN: 1.0000 g | Freq: Once | INTRAVENOUS | Status: DC

## 2024-02-10 MED ORDER — DEXTROSE 50 % IV SOLN
INTRAVENOUS | Status: AC | PRN
  Administered 2024-02-09: 25 g via INTRAVENOUS

## 2024-02-10 MED ORDER — RIVAROXABAN 15 MG PO TABS
15.0000 mg | ORAL_TABLET | Freq: Two times a day (BID) | ORAL | Status: DC
  Administered 2024-02-10: 15 mg via ORAL
  Filled 2024-02-10: qty 1

## 2024-02-10 MED ORDER — PROSOURCE TF20 ENFIT COMPATIBL EN LIQD
60.0000 mL | Freq: Every day | ENTERAL | Status: DC
  Filled 2024-02-10: qty 60

## 2024-02-10 MED ORDER — FREE WATER
30.0000 mL | Status: DC
  Administered 2024-02-10 – 2024-02-11 (×5): 30 mL

## 2024-02-10 MED ORDER — MIDAZOLAM HCL (PF) 2 MG/2ML IJ SOLN
1.0000 mg | INTRAMUSCULAR | Status: DC | PRN
  Administered 2024-02-10: 2 mg via INTRAVENOUS
  Filled 2024-02-10: qty 2

## 2024-02-10 MED ORDER — ORAL CARE MOUTH RINSE
15.0000 mL | OROMUCOSAL | Status: DC
  Administered 2024-02-10 – 2024-02-11 (×15): 15 mL via OROMUCOSAL
  Filled 2024-02-10 (×4): qty 15

## 2024-02-10 MED ORDER — ORAL CARE MOUTH RINSE: 15.0000 mL | OROMUCOSAL | Status: DC | PRN

## 2024-02-10 NOTE — ED Notes (Signed)
 Pacemaker interpreted - [employer].

## 2024-02-10 NOTE — Plan of Care (Signed)
  Problem: Fluid Volume: Goal: Ability to maintain a balanced intake and output will improve Outcome: Progressing   Problem: Coping: Goal: Ability to adjust to condition or change in health will improve Outcome: Not Progressing   Problem: Health Behavior/Discharge Planning: Goal: Ability to manage health-related needs will improve Outcome: Not Progressing   Problem: Metabolic: Goal: Ability to maintain appropriate glucose levels will improve Outcome: Not Progressing   Problem: Nutritional: Goal: Maintenance of adequate nutrition will improve Outcome: Not Progressing

## 2024-02-10 NOTE — Progress Notes (Signed)
 Pharmacy Antibiotic Note  Deborah Dawson is a 73 y.o. female admitted on 02/09/2024 with aspiration PNA.  Pharmacy has been consulted for Unasyn dosing.  Plan: Unasyn 3 gm IV Q6H ordered to start on 11/19 @ 0200.  Height: 5' 1 (154.9 cm) Weight: 66.7 kg (147 lb) IBW/kg (Calculated) : 47.8  Temp (24hrs), Avg:98.4 F (36.9 C), Min:97.7 F (36.5 C), Max:99.3 F (37.4 C)  Recent Labs  Lab 02/09/24 1640 02/09/24 2127  WBC 5.7  --   CREATININE 1.24*  --   LATICACIDVEN  --  0.7    Estimated Creatinine Clearance: 34.3 mL/min (A) (by C-G formula based on SCr of 1.24 mg/dL (H)).    Allergies  Allergen Reactions   Doxazosin Other (See Comments)    Cardura - cough   Doxycycline      Abdominal pain   Drug Class [Clindamycin/Lincomycin] Hives   Hydralazine Hcl Other (See Comments)    gout   Metformin  And Related Swelling   Ciprofloxacin Other (See Comments)    Numbness in face   Iodine Other (See Comments)    NOT CT CONTRAST PER PT   Pioglitazone Other (See Comments)   Sacubitril-Valsartan Cough    Pt states that it gave her a cough and chest palpitations   Semaglutide Nausea Only   Sulfamethoxazole-Trimethoprim Hives   Augmentin [Amoxicillin-Pot Clavulanate] Diarrhea   Dulaglutide Nausea Only   Hctz [Hydrochlorothiazide] Other (See Comments)    Gout   Losartan Diarrhea   Poison Oak Extract Rash   Red Dye #40 (Allura Red) Rash    Antimicrobials this admission:   >>    >>   Dose adjustments this admission:   Microbiology results:  BCx:   UCx:    Sputum:    MRSA PCR:   Thank you for allowing pharmacy to be a part of this patient's care.  Demarie Hyneman D 02/10/2024 1:58 AM

## 2024-02-10 NOTE — ED Provider Notes (Addendum)
 I was alerted by bedside nurse that patient had a witnessed loss of consciousness and appeared blue.  Asked RN to page admitting hospitalist team and immediately went to room and found patient to be cyanotic and with agonal breathing.  Oxygen saturations in the 90s.  No pulse, PEA on monitor.  Bedside nurse confirms full code.   CPR initiated, started airway bagging, pads placed on patient.  Remained in PEA.  Given 1 round of epinephrine and 1 round of empiric dextrose .  First pulse check remained in PEA.  On second pulse check was in sinus rhythm with palpable pulse.  Pressures remained stable.   I intubated patient for airway protection.  Case discussed with hospitalist team, had been admitted prior to my shift though was still in emergency department.  On review of chart, does appear to have hyperkalemia to 5.9 on labs earlier today, consider this as possible etiology of cardiac arrest.  Also appears to have potentially had a small amount of tongue biting on her left lateral tongue, consider seizure though no witnessed convulsions appreciated.  5-10 minutes later, patient did go into cardiac arrest again.  Sinus rhythm, heart rate about 100 though does appear to be wide-complex, overall not consistent with V. tach/V-fib.  Given IV calcium, bicarb, NS, insulin  and dextrose , magnesium , and a further dose of epinephrine.  On second pulse check, pulses were regained.  Complex did appear to narrow on monitor after these interventions.  ICU team assuming care.    Physical Exam  BP (!) 158/116   Pulse 79   Temp 98.1 F (36.7 C) (Oral)   Resp (!) 32   Ht 5' 1 (1.549 m)   Wt 66.7 kg   SpO2 98%   BMI 27.78 kg/m   Physical Exam  Procedures  .Critical Care  Performed by: Clarine Ozell LABOR, MD Authorized by: Clarine Ozell LABOR, MD   Critical care provider statement:    Critical care time (minutes):  45   Critical care time was exclusive of:  Separately billable procedures and treating other  patients   Critical care was necessary to treat or prevent imminent or life-threatening deterioration of the following conditions:  Cardiac failure   Critical care was time spent personally by me on the following activities:  Development of treatment plan with patient or surrogate, discussions with consultants, evaluation of patient's response to treatment, examination of patient, ordering and review of laboratory studies, ordering and review of radiographic studies, ordering and performing treatments and interventions, pulse oximetry, re-evaluation of patient's condition and review of old charts   I assumed direction of critical care for this patient from another provider in my specialty: no     Care discussed with: admitting provider   Procedure Name: Intubation Date/Time: 02/10/2024 12:34 AM  Performed by: Clarine Ozell LABOR, MDPre-anesthesia Checklist: Emergency Drugs available, Suction available, Patient being monitored and Patient identified Oxygen Delivery Method: Ambu bag Preoxygenation: Pre-oxygenation with 100% oxygen Induction Type: Rapid sequence Ventilation: Mask ventilation without difficulty Laryngoscope Size: Glidescope Grade View: Grade II Tube size: 7.5 mm Number of attempts: 1 Airway Equipment and Method: Patient positioned with wedge pillow Placement Confirmation: ETT inserted through vocal cords under direct vision, Positive ETCO2, CO2 detector and Breath sounds checked- equal and bilateral Secured at: 23 cm Tube secured with: ETT holder Comments: Intubated without complication      ED Course / MDM    Medical Decision Making Amount and/or Complexity of Data Reviewed Labs: ordered. Radiology: ordered.  Risk OTC  drugs. Prescription drug management. Decision regarding hospitalization.          Clarine Ozell LABOR, MD 02/10/24 [id number]    Clarine Ozell LABOR, MD 02/10/24 [phone]

## 2024-02-10 NOTE — Consult Note (Signed)
 NAME:  Deborah Dawson, MRN:  [id number], DOB:  Jan 07, 1951, LOS: 1 ADMISSION DATE:  02/09/2024, CONSULTATION DATE:  02/10/24 REFERRING MD:  Dr. Clarine, CHIEF COMPLAINT:  Shortness of breath   History of Present Illness:  73 yo F presenting to [facility] ED on 02/09/2024 for evaluation of shortness of breath.  History obtained per chart review and husband telephone report. Patient has a recent hospital admission from 01/24/2024 to 01/30/2024 for treatment of acute on chronic HFrEF with right-sided pleural effusion as well as acute small pulmonary embolism.  She underwent a thoracentesis x 2 and upon discharge her Eliquis was switched to Xarelto.  Upon discharge home she was in her normal state of health until around 02/04/2024 when she began developing progressive shortness of breath.  She also reported a chronic cough with clear to yellow sputum production on 02/10/2024. She followed up outpatient with cardiology and PCP in November 2025 after discharge. Outpatient CXR on 02/08/24 showed improved aeration but continued small pleural effusion.  She denied other symptoms, has been taking medication as prescribed, no known sick contacts.  ED course: Upon arrival patient alert and responsive complaining of shortness of breath.  Initial vitals show hypertension but otherwise stable on room air.  Patient eventually placed on 2 L nasal cannula for comfort.  Due to recent admission with small acute PE, CT angio ordered revealing no pulmonary embolism but possible developing right lower lobe consolidation.  Labs significant for hyperkalemia, improved NAGMA & AKI. Infectious workup initiated and TRH consulted for admission. Medications given: IV contrast, ceftriaxone  and azithromycin  Initial Vitals: 97.7, 24, 71, 162/106 & 95% on RA  Significant labs:  I, Jenita Ruth Rust-Chester, AGACNP-BC, personally viewed and interpreted this ECG. EKG Interpretation: Date: 02/10/24, EKG Time: 00:16, Rate: 117, Rhythm: irregular  V-tach (chronic LBBB), QRS axis:  Intervals: chronic LBBB, ST/T Wave abnormalities: mild STE in V1-V3- does not meet STEMI criteria, Narrative Interpretation: irregular Ventricular tachycardia  Chemistry: Na+:132, K+: 5.9, BUN/Cr.: 35/1.24, Serum CO2/ AG: 21/9 Hematology: WBC: 5.7, Hgb: 14.0,  Troponin: 49, BNP: pending, Lactic/ PCT: pending, COVID-19 & Influenza A/B:  ABG: pending  CXR 02/10/24:  Interval development of extensive airspace infiltrate within the right lung, predominantly within the right upper lobe, possibly representing acute pneumonic consolidation, pulmonary hemorrhage, or asymmetric alveolar pulmonary edema as could be seen with acute mitral valve regurgitation. 2. Known right pleural effusion seen on CT examination of 02/09/2024 is not well appreciated on this supine radiograph. No definite pneumothorax. CT angio chest 02/09/24: No pulmonary embolism 2. Diffuse bronchial wall thickening with mucous plugging, greater in the right lower lobe, with probable  developing small right lower lobe consolidation. 3. Small right pleural effusion. 4. Right hilar, left hilar, and mediastinal lymphadenopathy. Likely reactive in etiology. Recommend attention on follow-up. CTH 02/10/24: No acute intracranial abnormality CT chest wo contrast 02/10/24:  Extensive asymmetric pulmonary ground-glass infiltrate, multifocal consolidation, and smooth interlobular septal thickening, greatest in the right upper lobe, most suggestive of asymmetric pulmonary edema given rapid interval development; acute pulmonary hemorrhage is a differential consideration. Extensive pneumonic infiltrate or ARDS considered less likely. 2. Moderate to large right pleural effusion with compressive atelectasis of the right lower lobe. 3. Acute minimally displaced fracture of the left anterolateral seventh rib. 4. Thoracic adenopathy as outlined above. This is nonspecific, but may be reactive, inflammatory as can be seen with  conditions such as sarcoidosis, or lymphoproliferative in etiology. Comparison with interval examinations, if available, be helpful in determining stability. If none  are available, follow-up CT or PET CT examination would be helpful in 3 months to demonstrate stability or resolution. 5. Findings consistent with pulmonary arterial hypertension (enlarged central pulmonary arteries). 6. Mild coronary artery calcification and mild cardiomegaly.  While awaiting room placement in ED, patient reported feeling nauseous and was given some Zofran .  While the nurse was bedside she reports patient stating she did not feel well, with circumoral cyanosis and becoming suddenly unresponsive.  She also reported her jaw appeared tight like she bit her tongue briefly.  No noticeable change to rhythm.  When pulse checked patient found to be in PEA.  She underwent 1 round of CPR and ACLS prior to ROSC, she remained obtunded and was emergently intubated for airway protection. Then about 20 minutes later became pulseless in PEA again receiving 1 round of CPR and ACLS prior to her return of ROSC.   PCCM consulted for assistance in management due to PEA cardiac arrest suspect secondary to hyperkalemia in the setting of suspected CAP.  Pertinent  Medical History  HFrEF NYHA class II with PPM in place LBBB PAF on Xarelto HTN PE (01/24/24) T2DM CKD stage 3a? HTN  Significant Hospital Events: Including procedures, antibiotic start and stop dates in addition to other pertinent events   02/09/24: Admit to inpatient for treatment of suspected CAP.  Overnight patient with 2 brief PEA arrests requiring emergent intubation and mechanical ventilatory support.  Admit to ICU.  Interim History / Subjective:  Patient RASS -5 on mechanical ventilatory support. Updated husband via telephone, plan of care discussed, all questions and concerns answered at this time.  Objective    Blood pressure (!) 158/116, pulse 79, temperature  98.1 F (36.7 C), temperature source Oral, resp. rate (!) 32, height 5' 1 (1.549 m), weight 66.7 kg, SpO2 98%.       No intake or output data in the 24 hours ending 02/10/24 0007 Filed Weights   02/09/24 1515  Weight: 66.7 kg    Examination: General: Adult female, critically ill, lying in bed intubated, requiring mechanical ventilation  NAD HEENT: MM pink/moist, anicteric, atraumatic, neck supple Neuro: RASS -5, unable to follow commands, PERRL +4 CV: s1s2 irregular ventricular paced rhythm on monitor, no r/m/g Pulm: Regular, non labored on PRVC at 100% and a PEEP of 5, breath sounds coarse with faint expiratory wheezing-BUL & diminished-BLL GI: soft, rounded, non tender, bs x 4 GU: foley in place with clear yellow urine Skin: Stage 1 noted on sacrum, no lesions noted Extremities: warm/dry, pulses + 2 R/P, +1 edema noted BLE   Resolved problem list   Assessment and Plan  Cardiac arrest: initial rhythm PEA  Chronic HFrEF suspect possible exacerbation HTN PAF PMHx: recent acute PE UDS: pending, BNP pending - candidate for Normothermia Protocol  - Initiate vasopressors PRN, to maintain MAP > 65 - Echocardiogram ordered - hydralazine PRN for SBP > 165 - STAT labs, trend troponin, lactic - lasix  40 mg ordered - continue Xarelto - Cardiology consulted, appreciate input - Hold preadmission medication: Imdur, carvedilol , spironolactone  Acute hypoxic respiratory failure secondary to acute pulmonary edema vs aspiration in the setting of CAP and recurrent pleural effusion with compressive atelectasis - Ventilator settings: PRVC 8 mL/kg, 75% FiO2, 5 PEEP - Wean PEEP and FiO2 for sats greater than 90% - Plateau pressures less than 30 cm H20 - VAP bundle in place - Intermittent chest x-ray & ABG - Daily WUA/ SBT as tolerated - Ensure adequate pulmonary hygiene  - F/u cultures,  trend PCT > rule out COVID  - Continue Aspiration & CAP coverage: Unasyn with Azithromycin  - consider  IR consultation for thoracentesis  CKD Stage 3a Hyperkalemia Baseline Cr: 1.2-1.4, Cr on admission:1.24 (improved AKI on CKD from recent discharge, however post arrest labs not back yet) Patient received shifting measures during CODE BLUE event. STAT labs in process - Strict I/O's: alert provider if UOP < 0.5 mL/kg/hr - Daily BMP, replace electrolytes PRN - Avoid nephrotoxic agents as able, ensure adequate renal perfusion - Consult nephrology if iHD or CRRT indicated   Type 2 Diabetes Mellitus Hemoglobin A1C: 8 - Monitor CBG Q 4 hours - SSI moderate dosing - continue outpatient glargine at reduced dose of 8 units BID (outpatient dose: 20 units daily) - target range while in ICU: 140-180 - follow ICU hyper/hypo-glycemia protocol  Labs   CBC: Recent Labs  Lab 02/09/24 1640  WBC 5.7  HGB 14.0  HCT 43.7  MCV 88.6  PLT 220    Basic Metabolic Panel: Recent Labs  Lab 02/09/24 1640  NA 132*  K 5.9*  CL 102  CO2 21*  GLUCOSE 162*  BUN 35*  CREATININE 1.24*  CALCIUM 8.7*   GFR: Estimated Creatinine Clearance: 34.3 mL/min (A) (by C-G formula based on SCr of 1.24 mg/dL (H)). Recent Labs  Lab 02/09/24 1640 02/09/24 2127  WBC 5.7  --   LATICACIDVEN  --  0.7    Liver Function Tests: No results for input(s): AST, ALT, ALKPHOS, BILITOT, PROT, ALBUMIN in the last 168 hours. No results for input(s): LIPASE, AMYLASE in the last 168 hours. No results for input(s): AMMONIA in the last 168 hours.  ABG No results found for: PHART, PCO2ART, PO2ART, HCO3, TCO2, ACIDBASEDEF, O2SAT   Coagulation Profile: No results for input(s): INR, PROTIME in the last 168 hours.  Cardiac Enzymes: No results for input(s): CKTOTAL, CKMB, CKMBINDEX, TROPONINI in the last 168 hours.  HbA1C: Hgb A1c MFr Bld  Date/Time Value Ref Range Status  01/24/2024 10:05 AM 8.0 (H) 4.8 - 5.6 % Final    Comment:    (NOTE) Diagnosis of Diabetes The following  HbA1c ranges recommended by the American Diabetes Association (ADA) may be used as an aid in the diagnosis of diabetes mellitus.  Hemoglobin             Suggested A1C NGSP%              Diagnosis  <5.7                   Non Diabetic  5.7-6.4                Pre-Diabetic  >6.4                   Diabetic  <7.0                   Glycemic control for                       adults with diabetes.    06/24/2015 04:42 PM 7.9 (H) 4.0 - 6.0 % Final    CBG: Recent Labs  Lab 02/09/24 2257 02/09/24 2343  GLUCAP 122* 161*    Review of Systems:   UTA patient unresponsive and on mechanical vent support  Past Medical History:  She,  has a past medical history of Allergic genetic state, Atrial fibrillation (HCC) (2010), Cardiomyopathy (HCC), CHF (congestive heart failure) (HCC), Diabetes mellitus  without complication (HCC), Dysrhythmia, Gout, H/O cardiac catheterization, Hypertension, Joint pain, Osteopenia, Presence of permanent cardiac pacemaker, and Psoriasis.   Surgical History:   Past Surgical History:  Procedure Laterality Date   ABDOMINAL HYSTERECTOMY  1985   BREAST EXCISIONAL BIOPSY Left    negative years ago   BREAST SURGERY Left 1997   papilloma   CARDIAC CATHETERIZATION Left 08/01/2015   Procedure: Left Heart Cath and Coronary Angiography;  Surgeon: Vinie DELENA Jude, MD;  Location: [facility] INVASIVE CV LAB;  Service: Cardiovascular;  Laterality: Left;   CHOLECYSTECTOMY  1986   COLONOSCOPY WITH PROPOFOL  N/A 08/26/2017   Procedure: COLONOSCOPY WITH PROPOFOL ;  Surgeon: Toledo, Ladell POUR, MD;  Location: [facility] ENDOSCOPY;  Service: Gastroenterology;  Laterality: N/A;   ESOPHAGOGASTRODUODENOSCOPY (EGD) WITH PROPOFOL  N/A 08/26/2017   Procedure: ESOPHAGOGASTRODUODENOSCOPY (EGD) WITH PROPOFOL ;  Surgeon: Toledo, Ladell POUR, MD;  Location: [facility] ENDOSCOPY;  Service: Gastroenterology;  Laterality: N/A;   ESOPHAGOGASTRODUODENOSCOPY (EGD) WITH PROPOFOL  N/A 07/04/2019   Procedure:  ESOPHAGOGASTRODUODENOSCOPY (EGD) WITH PROPOFOL ;  Surgeon: Toledo, Ladell POUR, MD;  Location: [facility] ENDOSCOPY;  Service: Gastroenterology;  Laterality: N/A;   ESOPHAGOGASTRODUODENOSCOPY (EGD) WITH PROPOFOL  N/A 06/12/2023   Procedure: ESOPHAGOGASTRODUODENOSCOPY (EGD) WITH PROPOFOL ;  Surgeon: Maryruth Ole DASEN, MD;  Location: [facility] ENDOSCOPY;  Service: Endoscopy;  Laterality: N/A;  DM   TUBAL LIGATION  1981     Social History:   reports that she has never smoked. She has never used smokeless tobacco. She reports that she does not drink alcohol and does not use drugs.   Family History:  Her family history includes Breast cancer (age of onset: 75) in her mother; Cancer (age of onset: 48) in her mother; Coronary artery disease in her father and mother; Diabetes in her mother and son; Stroke in her father.   Allergies Allergies  Allergen Reactions   Doxazosin Other (See Comments)    Cardura - cough   Doxycycline      Abdominal pain   Drug Class [Clindamycin/Lincomycin] Hives   Hydralazine  Hcl Other (See Comments)    gout   Metformin  And Related Swelling   Ciprofloxacin Other (See Comments)    Numbness in face   Iodine Other (See Comments)    NOT CT CONTRAST PER PT   Pioglitazone Other (See Comments)   Sacubitril-Valsartan Cough    Pt states that it gave her a cough and chest palpitations   Semaglutide Nausea Only   Sulfamethoxazole-Trimethoprim Hives   Augmentin [Amoxicillin-Pot Clavulanate] Diarrhea   Dulaglutide Nausea Only   Hctz [Hydrochlorothiazide] Other (See Comments)    Gout   Losartan Diarrhea   Poison Oak Extract Rash   Red Dye #40 (Allura Red) Rash     Home Medications  Prior to Admission medications   Medication Sig Start Date End Date Taking? Authorizing Provider  carvedilol  (COREG ) 12.5 MG tablet Take 1 tablet (12.5 mg total) by mouth 2 (two) times daily with a meal. 01/30/24  Yes Wieting, Richard, MD  gabapentin  (NEURONTIN ) 100 MG capsule Take 100 mg by mouth 2 (two)  times daily. 09/28/23  Yes [provider]  insulin  glargine (LANTUS  SOLOSTAR) 100 UNIT/ML Solostar Pen Inject 20 Units into the skin at bedtime. 01/30/24 01/29/25 Yes Wieting, Richard, MD  isosorbide  mononitrate (IMDUR ) 30 MG 24 hr tablet Take 1 tablet (30 mg total) by mouth daily. 01/30/24  Yes Josette Ade, MD  Magnesium  Gluconate 550 MG TABS Take by mouth.   Yes [provider]  RIVAROXABAN  (XARELTO ) VTE STARTER PACK (15 & 20 MG) Follow package  directions: Take one 15mg  tablet by mouth twice a day. On day 22, switch to one 20mg  tablet once a day. Take with food. 01/30/24  Yes Wieting, Richard, MD  spironolactone  (ALDACTONE ) 25 MG tablet Take 1/2 tablet (12.5 mg total) by mouth daily. 01/30/24  Yes Josette Ade, MD  Vitamin D, Ergocalciferol, 2000 units CAPS Take 1 capsule by mouth daily.   Yes [provider]  fluticasone  (FLONASE ) 50 MCG/ACT nasal spray Place 1 spray into both nostrils 2 (two) times daily. Patient not taking: Reported on 01/24/2024 07/31/20   [provider]     Critical care time: 68 minutes     Jenita Jama Meek, AGACNP-BC Acute Care Nurse Practitioner [facility]   [phone] / [phone] Please see Amion for details.

## 2024-02-10 NOTE — Progress Notes (Signed)
 eLink Physician-Brief Progress Note Patient Name: Deborah Dawson DOB: [birth year] MRN: [id number]   Date of Service  02/10/2024  HPI/Events of Note  73 y.o. 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   Vital signs within normal limits saturating at 100% on mechanical ventilation on propofol  infusion.  Results reflect hyperkalemia, elevated creatinine.  CT imaging consistent with multifocal airspace disease.  Endotracheal tube at the carina  eICU Interventions  Agree with broad-spectrum antibiotics-ceftriaxone  and azithromycin .  Airway clearance therapy, respiratory cultures.  Pneumonia workup in place  Continue systemic anticoagulation with Xarelto  Neuroprotective measures- normothermia, euglycemia, HOB greater than 30, normocapnia, normoxia   Status post hyperkalemia protocol, hold home spironolactone, consider additional crystalloids  DVT prophylaxis with therapeutic rivaroxaban GI prophylaxis with famotidine     Intervention Category Evaluation Type: New Patient Evaluation  Thang Flett 02/10/2024, 1:45 AM

## 2024-02-10 NOTE — TOC Initial Note (Signed)
 Transition of Care [facility]) - Initial/Assessment Note    Patient Details  Name: Deborah Dawson MRN: [id number] Date of Birth: 01/14/1951  Transition of Care [facility]) CM/SW Contact:    Nathanael CHRISTELLA Ring, RN Phone Number: 02/10/2024, 1:40 PM  Clinical Narrative:                 Patient admitted to the hospital with pneumonia and in the ICU after cardiac arrest requiring CPR and intubation, she is currently intubated and sedated.  Her husband, Darrell is at the bedside.  He reports that he is doing okay.  He and the patient live in a house in [place], he reports that she is independent at home and requires no assisted devices, she does have a driver's license but he provides most of the transportation.  Current with her PCP, Dr. Sadie, he reports that she is compliant with her medications at home and they use [facility] which is not far from their home in [place].  Not on oxygen at home, has never needed home health or SNF in the past.   Expected Discharge Plan: Home w Home Health Services Barriers to Discharge: Continued Medical Work up   Patient Goals and CMS Choice Patient states their goals for this hospitalization and ongoing recovery are:: Husband would like for her to return home and be able to do the things that they enjoy doing          Expected Discharge Plan and Services       Living arrangements for the past 2 months: Single Family Home                                      Prior Living Arrangements/Services Living arrangements for the past 2 months: Single Family Home Lives with:: Spouse Patient language and need for interpreter reviewed:: Yes Do you feel safe going back to the place where you live?: Yes      Need for Family Participation in Patient Care: Yes (Comment) Care giver support system in place?: Yes (comment)   Criminal Activity/Legal Involvement Pertinent to Current Situation/Hospitalization: No - Comment as needed  Activities of Daily Living       Permission Sought/Granted Permission sought to share information with : Family Supports                Emotional Assessment Appearance:: Appears stated age Attitude/Demeanor/Rapport: Unable to Assess Affect (typically observed): Unable to Assess   Alcohol / Substance Use: Not Applicable Psych Involvement: No (comment)  Admission diagnosis:  Cardiac arrest (HCC) [I46.9] CAP (community acquired pneumonia) [J18.9] Dyspnea, unspecified type [R06.00] Community acquired pneumonia, unspecified laterality [J18.9] Patient Active Problem List   Diagnosis Date Noted   Cardiac arrest (HCC) 02/10/2024   CAP (community acquired pneumonia) 02/09/2024   Acute on chronic systolic CHF (congestive heart failure) (HCC) 01/27/2024   Acute kidney injury superimposed on CKD 01/27/2024   Paroxysmal atrial fibrillation (HCC) 01/27/2024   Uncontrolled type 2 diabetes mellitus with hyperglycemia, with long-term current use of insulin  (HCC) 01/27/2024   Pleural effusion on right 01/25/2024   Acute pulmonary embolism (HCC) 01/24/2024   Malnutrition of moderate degree 08/09/2020   COVID-19 with multiple comorbidities 08/08/2020   Acute pulmonary edema (HCC) 06/24/2015   Acute respiratory failure with hypoxia (HCC) 06/24/2015   Malignant essential hypertension 06/24/2015   Chest pain 06/24/2015   Atrial fibrillation with RVR (HCC) 06/24/2015  Leukocytosis 06/24/2015   Heart failure (HCC) 06/24/2015   Encounter for screening colonoscopy 06/23/2014   Chest wall pain 06/23/2014   PCP:  Sadie Manna, MD Pharmacy:   [facility] [phone] [address] [place], [place] - [address] AT [facility] [address] [place] [address] Phone: [phone] Fax: [phone]  [facility] [address] [place] [address] Phone: [phone] Fax: [phone]     Social Drivers of Health (SDOH) Social History: SDOH Screenings   Food  Insecurity: Patient Unable To Answer (02/10/2024)  Housing: Patient Unable To Answer (02/10/2024)  Transportation Needs: Patient Unable To Answer (02/10/2024)  Utilities: Patient Unable To Answer (02/10/2024)  Financial Resource Strain: Low Risk  (11/02/2023)   Received [facility]  Social Connections: Patient Unable To Answer (02/10/2024)  Tobacco Use: Low Risk  (02/08/2024)   Received from [facility]   SDOH Interventions:     Readmission Risk Interventions     No data to display

## 2024-02-10 NOTE — Progress Notes (Signed)
 CODE BLUE note:  Date of note: 02/09/2024  The patient was found unresponsive, cyanotic and pulseless.  CODE BLUE was called at 2349.  CPR was immediately started.  She was in PEA on monitor.  The patient received 1 mg of IV epinephrine and empiric 1 ampoule of D50.  ROSC was reached at 2354.  The patient was then intubated for airway protection with RSI with etomidate and rocuronium followed by IV propofol  for sedation. She lost her pulse again within 10-minute and CPR was restarted for 2 rounds.  She was given an amp of calcium chloride and 1 amp of sodium bicarbonate with 1 mg of IV epinephrine as well as D50/insulin  and IV magnesium  sulfate and ROSC was established again.  The case was discussed with the ICU team who will assume care.

## 2024-02-10 NOTE — Progress Notes (Signed)
*  PRELIMINARY RESULTS* Echocardiogram 2D Echocardiogram has been performed.  Floydene Harder 02/10/2024, 9:14 AM

## 2024-02-10 NOTE — Consult Note (Addendum)
 [facility] CARDIOLOGY CONSULT NOTE       Patient ID: Deborah Dawson MRN: [id number] DOB/AGE: June 30, 1950 73 y.o.  Admit date: 02/09/2024 Referring Physician Inge Lecher, NP Primary Physician Sadie Manna, MD  Primary Cardiologist Dr. Ammon Reason for Consultation post-PEA arrest  HPI: Deborah Dawson is a 73 y.o. female  with a past medical history of chronic HFrEF, s/p CRT-P 07/2021, persistent atrial fibrillation, hypertension, type 2 diabetes who presented to the ED on 02/09/2024 for shortness of breath. CTA chest concerning for PNA. Overnight had PEA arrest x2 with ROSC. Cardiology was consulted for further evaluation.   Patient initially presented to ED for evaluation of SOB. Workup in the ED notable for creatinine 1.24, potassium 5.9, hemoglobin 14.0, WBC 5.7. Troponins 49 > 92 > 122 > 124 > 127, BNP 15,914. Lactic acid 0.7 > 3.3 > 2.9 > 1.8. EKG in the ED VP rate 69 bpm. CTA chest with PNA, started on IV antibiotics. Overnight suffered short PEA arrest with ROSC then recurrent arrest with ROSC again roughly 10 minutes later.   At the time of my evaluation this AM, patient is laying in hospital bed intubated/sedated.  Currently on Precedex .  Ventricular pacing on telemetry.  BP and heart rate controlled, not requiring any vasopressors.  Troponins are mildly elevated and flat trending, potassium is improved today.  Review of systems complete and found to be negative unless listed above    Past Medical History:  Diagnosis Date   Allergic genetic state    Atrial fibrillation (HCC) 2010   Cardiomyopathy (HCC)    CHF (congestive heart failure) (HCC)    Diabetes mellitus without complication (HCC)    Dysrhythmia    Gout    H/O cardiac catheterization    Hypertension    Joint pain    Osteopenia    Presence of permanent cardiac pacemaker    Psoriasis     Past Surgical History:  Procedure Laterality Date   ABDOMINAL HYSTERECTOMY  1985   BREAST EXCISIONAL BIOPSY Left     negative years ago   BREAST SURGERY Left 1997   papilloma   CARDIAC CATHETERIZATION Left 08/01/2015   Procedure: Left Heart Cath and Coronary Angiography;  Surgeon: Vinie DELENA Jude, MD;  Location: [facility];  Service: Cardiovascular;  Laterality: Left;   CHOLECYSTECTOMY  1986   COLONOSCOPY WITH PROPOFOL  N/A 08/26/2017   Procedure: COLONOSCOPY WITH PROPOFOL ;  Surgeon: Toledo, Ladell POUR, MD;  Location: [facility];  Service: Gastroenterology;  Laterality: N/A;   ESOPHAGOGASTRODUODENOSCOPY (EGD) WITH PROPOFOL  N/A 08/26/2017   Procedure: ESOPHAGOGASTRODUODENOSCOPY (EGD) WITH PROPOFOL ;  Surgeon: Toledo, Ladell POUR, MD;  Location: [facility];  Service: Gastroenterology;  Laterality: N/A;   ESOPHAGOGASTRODUODENOSCOPY (EGD) WITH PROPOFOL  N/A 07/04/2019   Procedure: ESOPHAGOGASTRODUODENOSCOPY (EGD) WITH PROPOFOL ;  Surgeon: Toledo, Ladell POUR, MD;  Location: [facility];  Service: Gastroenterology;  Laterality: N/A;   ESOPHAGOGASTRODUODENOSCOPY (EGD) WITH PROPOFOL  N/A 06/12/2023   Procedure: ESOPHAGOGASTRODUODENOSCOPY (EGD) WITH PROPOFOL ;  Surgeon: Maryruth Ole DASEN, MD;  Location: [facility];  Service: Endoscopy;  Laterality: N/A;  DM   TUBAL LIGATION  1981    Medications Prior to Admission  Medication Sig Dispense Refill Last Dose/Taking   carvedilol  (COREG ) 12.5 MG tablet Take 1 tablet (12.5 mg total) by mouth 2 (two) times daily with a meal. 60 tablet 0 02/09/2024 Morning   gabapentin (NEURONTIN) 100 MG capsule Take 100 mg by mouth 2 (two) times daily.   02/09/2024 Morning   insulin  glargine (LANTUS  SOLOSTAR) 100 UNIT/ML  Solostar Pen Inject 20 Units into the skin at bedtime.   02/08/2024   isosorbide mononitrate (IMDUR) 30 MG 24 hr tablet Take 1 tablet (30 mg total) by mouth daily. 30 tablet 0 02/09/2024   Magnesium  Gluconate 550 MG TABS Take by mouth.   Taking   RIVAROXABAN (XARELTO) VTE STARTER PACK (15 & 20 MG) Follow package directions: Take one 15mg  tablet by mouth twice a  day. On day 22, switch to one 20mg  tablet once a day. Take with food. 51 each 0 02/09/2024 Morning   spironolactone (ALDACTONE) 25 MG tablet Take 1/2 tablet (12.5 mg total) by mouth daily. 30 tablet 0 02/09/2024   Vitamin D, Ergocalciferol, 2000 units CAPS Take 1 capsule by mouth daily.   Taking   fluticasone (FLONASE) 50 MCG/ACT nasal spray Place 1 spray into both nostrils 2 (two) times daily. (Patient not taking: Reported on 01/24/2024)   Not Taking   Social History   Socioeconomic History   Marital status: Married    Spouse name: Not on file   Number of children: Not on file   Years of education: Not on file   Highest education level: Not on file  Occupational History   Not on file  Tobacco Use   Smoking status: Never   Smokeless tobacco: Never  Vaping Use   Vaping status: Never Used  Substance and Sexual Activity   Alcohol use: No    Alcohol/week: 0.0 standard drinks of alcohol   Drug use: No   Sexual activity: Not on file    Comment: Married   Other Topics Concern   Not on file  Social History Narrative   Not on file   Social Drivers of Health   Financial Resource Strain: Low Risk  (11/02/2023)   Received from [facility]   Overall Financial Resource Strain (CARDIA)    Difficulty of Paying Living Expenses: Not hard at all  Food Insecurity: Patient Unable To Answer (02/10/2024)   Hunger Vital Sign    Worried About Programme Researcher, Broadcasting/film/video in the Last Year: Patient unable to answer    Ran Out of Food in the Last Year: Patient unable to answer  Transportation Needs: Patient Unable To Answer (02/10/2024)   PRAPARE - Transportation    Lack of Transportation (Medical): Patient unable to answer    Lack of Transportation (Non-Medical): Patient unable to answer  Physical Activity: Not on file  Stress: Not on file  Social Connections: Patient Unable To Answer (02/10/2024)   Social Connection and Isolation Panel    Frequency of Communication with Friends and  Family: Patient unable to answer    Frequency of Social Gatherings with Friends and Family: Patient unable to answer    Attends Religious Services: Patient unable to answer    Active Member of Clubs or Organizations: Patient unable to answer    Attends Banker Meetings: Patient unable to answer    Marital Status: Patient unable to answer  Intimate Partner Violence: Patient Unable To Answer (02/10/2024)   Humiliation, Afraid, Rape, and Kick questionnaire    Fear of Current or Ex-Partner: Patient unable to answer    Emotionally Abused: Patient unable to answer    Physically Abused: Patient unable to answer    Sexually Abused: Patient unable to answer    Family History  Problem Relation Age of Onset   Cancer Mother 60       breast   Diabetes Mother    Breast cancer Mother 39  Coronary artery disease Mother    Stroke Father    Coronary artery disease Father    Diabetes Son      Vitals:   02/10/24 0900 02/10/24 1000 02/10/24 1100 02/10/24 1101  BP: 130/84 90/79 (!) 111/56   Pulse: 80 82 76   Resp: (!) 25 (!) 22 17   Temp:   (!) 101.1 F (38.4 C)   TempSrc:      SpO2:    97%  Weight:      Height:        PHYSICAL EXAM General: Ill appearing elderly female, intubated/sedated. HEENT: Normocephalic and atraumatic. Neck: No JVD.  Lungs: Course breath sounds bilaterally Heart: HRRR. Normal S1 and S2 without gallops or murmurs.  Abdomen: Non-distended appearing.  Msk: Normal strength and tone for age. Extremities: Warm and well perfused. No clubbing, cyanosis. 1+ pitting edema.  Neuro: Alert and oriented X 3. Psych: Answers questions appropriately.   Labs: Basic Metabolic Panel: Recent Labs    02/09/24 1640 02/10/24 0213  NA 132* 134*  K 5.9* 4.4  CL 102 102  CO2 21* 21*  GLUCOSE 162* 270*  BUN 35* 34*  CREATININE 1.24* 1.44*  CALCIUM  8.7* 9.2  MG  --  2.7*  PHOS  --  4.0   Liver Function Tests: Recent Labs    02/10/24 0213  AST 93*  ALT 41   ALKPHOS 99  BILITOT 0.8  PROT 6.7  ALBUMIN 3.3*   No results for input(s): LIPASE, AMYLASE in the last 72 hours. CBC: Recent Labs    02/09/24 1640 02/10/24 0213  WBC 5.7 16.3*  HGB 14.0 13.7  HCT 43.7 42.9  MCV 88.6 90.1  PLT 220 216   Cardiac Enzymes: No results for input(s): CKTOTAL, CKMB, CKMBINDEX, TROPONINIHS in the last 72 hours. BNP: No results for input(s): BNP in the last 72 hours. D-Dimer: No results for input(s): DDIMER in the last 72 hours. Hemoglobin A1C: No results for input(s): HGBA1C in the last 72 hours. Fasting Lipid Panel: No results for input(s): CHOL, HDL, LDLCALC, TRIG, CHOLHDL, LDLDIRECT in the last 72 hours. Thyroid Function Tests: No results for input(s): TSH, T4TOTAL, T3FREE, THYROIDAB in the last 72 hours.  Invalid input(s): FREET3 Anemia Panel: No results for input(s): VITAMINB12, FOLATE, FERRITIN, TIBC, IRON, RETICCTPCT in the last 72 hours.   Radiology: DG Abd 1 View Result Date: 02/10/2024 EXAM: 1 VIEW XRAY OF THE ABDOMEN 02/10/2024 04:44:00 AM COMPARISON: 02/10/2024 CLINICAL HISTORY: Encounter for imaging study to confirm orogastric (OG) tube placement FINDINGS: LINES, TUBES AND DEVICES: Gastric tube in place with side hole at the level of the gastric body. Cardiac pacemaker noted. BOWEL: Nonobstructive bowel gas pattern. SOFT TISSUES: Surgical clips in the right upper quadrant. No opaque urinary calculi. BONES: No acute osseous abnormality. LUNG BASES: Interstitial and patchy airspace opacities in the partially visualized lung bases, probably atelectasis. IMPRESSION: 1. Satisfactory enteric tube placement into the stomach. Electronically signed by: Helayne Hurst MD 02/10/2024 05:25 AM EST RP Workstation: [id number]   DG Abd 1 View Result Date: 02/10/2024 EXAM: 1 VIEW XRAY OF THE ABDOMEN 02/10/2024 04:05:00 AM COMPARISON: None available. CLINICAL HISTORY: Encounter for imaging study to confirm  orogastric (OG) tube placement. FINDINGS: LINES, TUBES AND DEVICES: Enteric tube in place with tip and side port overlying the expected region of the gastric lumen. Cardiac pacemaker noted. BOWEL: Nonobstructive bowel gas pattern. SOFT TISSUES: No opaque urinary calculi. BONES: No acute osseous abnormality. LUNG BASES: Patchy airspace opacities in the partially visualized  lung bases. IMPRESSION: 1. Enteric tube in appropriate position with tip and side port overlying the expected region of the gastric lumen. 2. Patchy airspace opacities in the partially visualized lung bases. Electronically signed by: Dorethia Molt MD 02/10/2024 04:25 AM EST RP Workstation: [id number]   CT CHEST WO CONTRAST Result Date: 02/10/2024 EXAM: CT CHEST WITHOUT CONTRAST 02/10/2024 01:28:16 AM TECHNIQUE: CT of the chest was performed without the administration of intravenous contrast. Multiplanar reformatted images are provided for review. Automated exposure control, iterative reconstruction, and/or weight based adjustment of the mA/kV was utilized to reduce the radiation dose to as low as reasonably achievable. COMPARISON: Vertebrae 18 to 2025. CLINICAL HISTORY: CXR post cardiac arrest: predominantly within the right upper lobe, possibly representing acute pneumonic consolidation, pulmonary hemorrhage, or asymmetric alveolar pulmonary edema as could be seen with acute mitral valve regurgitation. FINDINGS: MEDIASTINUM: Heart: Mild coronary artery calcification. Mild cardiomegaly. A left subclavian dual-lead pacemaker is identified with leads within the left ventricular venous outflow and right ventricle toward the apex. No pericardial effusion. Vessels: The central pulmonary arteries are enlarged in keeping with changes of pulmonary arterial hypertension. Mild atherosclerotic calcification within the thoracic aorta. No aortic aneurysm. Airways: The central airways are clear. Interval endotracheal tube placement attempts 2.6 cm above the  carina. Other: Interval nasogastric tube placement extending into the stomach beyond the margin of the exam. Visualized thyroid is unremarkable. LYMPH NODES: Extensive mediastinal adenopathy is again identified and appears stable, nonspecific. This may be reactive, inflammatory as can be seen with conditions such as sarcoidosis, or lymphoproliferative in etiology. Comparison with interval examinations, if available, would be helpful in determining stability. If none are available, follow-up CT or PET CT examination would be helpful in 3 months to demonstrate stability or resolution. LUNGS AND PLEURA: Extensive asymmetric pulmonary ground-glass infiltrate, multiple consolidation, and smooth interlobular septal thickening, asymmetrically more severe within the right upper lobe. Given its relatively rapid development since prior examination, the findings are most suggestive of asymmetric pulmonary edema, though pulmonary hemorrhage could appear similarly. Extensive pneumonic infiltrate or changes of ARDS are considered less likely given the relatively rapid development. Moderate to large right pleural effusion again noted with compressive atelectasis of the right lower lobe. No pneumothorax. SOFT TISSUES/BONES: Acute fracture of the left seventh rib anterolaterally with minimal displacement. No other acute bone abnormality identified. Osseous structures are otherwise age appropriate. UPPER ABDOMEN: Limited images of the upper abdomen demonstrates no acute abnormality. IMPRESSION: 1. Extensive asymmetric pulmonary ground-glass infiltrate, multifocal consolidation, and smooth interlobular septal thickening, greatest in the right upper lobe, most suggestive of asymmetric pulmonary edema given rapid interval development; acute pulmonary hemorrhage is a differential consideration. Extensive pneumonic infiltrate or ARDS considered less likely. 2. Moderate to large right pleural effusion with compressive atelectasis of the  right lower lobe. 3. Acute minimally displaced fracture of the left anterolateral seventh rib. 4. Thoracic adenopathy as outlined above. This is nonspecific, but may be reactive, inflammatory as can be seen with conditions such as sarcoidosis, or lymphoproliferative in etiology. Comparison with interval examinations, if available, be helpful in determining stability. If none are available, follow-up CT or PET CT examination would be helpful in 3 months to demonstrate stability or resolution. 5. Findings consistent with pulmonary arterial hypertension (enlarged central pulmonary arteries). 6. Mild coronary artery calcification and mild cardiomegaly. Electronically signed by: Dorethia Molt MD 02/10/2024 01:46 AM EST RP Workstation: [id number]   CT HEAD WO CONTRAST ( ) Result Date: 02/10/2024 EXAM: CT HEAD WITHOUT CONTRAST 02/10/2024 01:28:16 AM  TECHNIQUE: CT of the head was performed without the administration of intravenous contrast. Automated exposure control, iterative reconstruction, and/or weight based adjustment of the mA/kV was utilized to reduce the radiation dose to as low as reasonably achievable. COMPARISON: None available. CLINICAL HISTORY: Mental status change, unknown cause. FINDINGS: BRAIN AND VENTRICLES: Cerebral ventricle sizes are concordant with the degree of cerebral volume loss.No acute hemorrhage. No evidence of acute infarct. No hydrocephalus. No extra-axial collection. No mass effect or midline shift. Residual contrast material within intracranial vasculature from recent contrast administration. ORBITS: Bilateral lens replacement. SINUSES: No acute abnormality. SOFT TISSUES AND SKULL: Partially imaged endotracheal tube in place. No acute soft tissue abnormality. No skull fracture. IMPRESSION: 1. No acute intracranial abnormality. Electronically signed by: Morgane Naveau MD 02/10/2024 01:40 AM EST RP Workstation: [id number]   DG Chest Portable 1 View Result Date: 02/10/2024 EXAM: 1  VIEW(S) XRAY OF THE CHEST 02/10/2024 12:12:00 AM COMPARISON: 02/09/2024 CLINICAL HISTORY: post intubation FINDINGS: LINES, TUBES AND DEVICES: Endotracheal tube in place with tip 2.2 cm above the carina. Enteric tube in place, courses below the diaphragm. LUNGS AND PLEURA: Interval development of extensive airspace infiltrate within the right lung, predominantly within the right upper lobe. This may represent acute pneumonic consolidation, pulmonary hemorrhage or asymmetric alveolar pulmonary edema as could be seen with acute mitral valve regurgitation. Known right pleural effusion seen on CT examination of 02/09/2024 is not well appreciated on this supine radiograph. No definite pneumothorax. HEART AND MEDIASTINUM: Similar mild cardiomegaly. Left chest cardiac pacemaker noted. Aortic arch atherosclerosis. BONES AND SOFT TISSUES: No acute osseous abnormality. IMPRESSION: 1. Interval development of extensive airspace infiltrate within the right lung, predominantly within the right upper lobe, possibly representing acute pneumonic consolidation, pulmonary hemorrhage, or asymmetric alveolar pulmonary edema as could be seen with acute mitral valve regurgitation. 2. Known right pleural effusion seen on CT examination of 02/09/2024 is not well appreciated on this supine radiograph. No definite pneumothorax. Electronically signed by: Dorethia Molt MD 02/10/2024 12:22 AM EST RP Workstation: [id number]   CT Angio Chest PE W and/or Wo Contrast Result Date: 02/09/2024 EXAM: CTA CHEST 02/09/2024 06:29:20 PM TECHNIQUE: CTA of the chest was performed without and with the administration of 75 mL of iohexol  (OMNIPAQUE ) 350 MG/ML injection. Multiplanar reformatted images are provided for review. MIP images are provided for review. Automated exposure control, iterative reconstruction, and/or weight based adjustment of the mA/kV was utilized to reduce the radiation dose to as low as reasonably achievable. COMPARISON: None  available. CLINICAL HISTORY: Pulmonary embolism (PE) suspected, low to intermediate prob, positive D-dimer. FINDINGS: PULMONARY ARTERIES: Pulmonary arteries are adequately opacified for evaluation. No acute pulmonary embolus. Main pulmonary artery is normal in caliber. MEDIASTINUM: The heart and pericardium demonstrate no acute abnormality. There is no acute abnormality of the thoracic aorta. LYMPH NODES: Right hilar lymphadenopathy with, as an example, a 1.2 cm lymph node (5:54). Left hilar lymphadenopathy with, as an example, a 1.2 cm lymph node (5 x 15 x 4 mm). Mediastinal lymphadenopathy with a right paratracheal 1.1 cm lymph node (5.32). No axillary lymphadenopathy. LUNGS AND PLEURA: At least a small volume right pleural effusion. No left pleural effusion. No pneumothorax. Basal atelectasis of the right lower lobe. Diffuse bronchial wall thickening with mucous plugging within the right lower lobe. Mucous plugging within the left lower lobe to a lesser extent. Question of developing small consolidation within the right lower lobe (6:88) with associated bronchial plugging. The trachea and mainstem bronchi are patent. Expiratory phase respiration. UPPER ABDOMEN:  Limited images of the upper abdomen are unremarkable. SOFT TISSUES AND BONES: Left chest wall 2 lead cardiac device. No acute bone or soft tissue abnormality. IMPRESSION: 1. No pulmonary embolism. 2. Diffuse bronchial wall thickening with mucous plugging, greater in the right lower lobe, with probable developing small right lower lobe consolidation. 3. Small right pleural effusion. 4. Right hilar, left hilar, and mediastinal lymphadenopathy. Likely reactive in etiology. Recommend attention on follow-up. Electronically signed by: Morgane Naveau MD 02/09/2024 07:12 PM EST RP Workstation: [id number]   DG Chest 2 View Result Date: 02/09/2024 CLINICAL DATA:  Shortness of breath and pleural effusion. EXAM: CHEST - 2 VIEW COMPARISON:  Chest x-ray 01/29/2024.   Chest CT 01/24/2024. FINDINGS: There is a small right pleural effusion. Left lung is clear. No pneumothorax or focal lung infiltrate. The cardiac silhouette is enlarged. Left-sided pacemaker is again seen. No acute fractures are identified. IMPRESSION: 1. Small right pleural effusion. 2. Cardiomegaly. Electronically Signed   By: Greig Pique M.D.   On: 02/09/2024 15:56   US  THORACENTESIS ASP PLEURAL SPACE W/IMG GUIDE Result Date: 01/29/2024 INDICATION: 73 year old female presents with shortness of breath, cough, and recurrent right pleural effusion. Received request for diagnostic and therapeutic thoracentesis. EXAM: ULTRASOUND GUIDED RIGHT THORACENTESIS MEDICATIONS: 10 mL 1% lidocaine  COMPLICATIONS: None immediate. PROCEDURE: An ultrasound guided thoracentesis was thoroughly discussed with the patient and questions answered. The benefits, risks, alternatives and complications were also discussed. The patient understands and wishes to proceed with the procedure. Written consent was obtained. Ultrasound was performed to localize and mark an adequate pocket of fluid in the right chest. The area was then prepped and draped in the normal sterile fashion. 1% lidocaine  was used for local anesthesia. Under ultrasound guidance a 6 Fr Safe-T-Centesis catheter was introduced. Thoracentesis was performed. The catheter was removed and a dressing applied. FINDINGS: A total of approximately 700 mL of amber fluid was removed. Samples were sent to the laboratory as requested by the clinical team. IMPRESSION: Successful ultrasound guided right thoracentesis yielding 700 mL of pleural fluid. Performed by: Kristi Davenport, NP Electronically Signed   By: Cordella Banner   On: 01/29/2024 15:39   DG Chest Port 1 View Result Date: 01/29/2024 EXAM: 1 VIEW(S) XRAY OF THE CHEST 01/29/2024 11:40:00 AM COMPARISON: 01/29/2024 CLINICAL HISTORY: Recurrent right pleural effusion FINDINGS: LINES, TUBES AND DEVICES: Left chest pacemaker  with leads terminating in the right atrium and coronary sinus. LUNGS AND PLEURA: No focal pulmonary opacity. No pulmonary edema. Near complete resolution of the right pleural effusion with minimal blunting of the right costophrenic angle. Improved aeration of right lung base. No pneumothorax. HEART AND MEDIASTINUM: Cardiomegaly. BONES AND SOFT TISSUES: No acute osseous abnormality. Cholecystectomy clips. IMPRESSION: 1. Near complete resolution of the right pleural effusion with minimal residual blunting of the right costophrenic angle and improved aeration of the right lung base. Electronically signed by: Rogelia Myers MD 01/29/2024 12:53 PM EST RP Workstation: [id number]   DG Chest 2 View Result Date: 01/29/2024 EXAM: 2 VIEW(S) XRAY OF THE CHEST 01/29/2024 06:46:00 AM COMPARISON: 01/26/2024 CLINICAL HISTORY: Pleural effusion on right. FINDINGS: LINES, TUBES AND DEVICES: Stable 2 lead left chest pacemaker. LUNGS AND PLEURA: Increased right base airspace disease. Small right pleural effusion, mildly increased. No pulmonary edema. No pneumothorax. HEART AND MEDIASTINUM: No acute abnormality of the cardiac and mediastinal silhouettes. BONES AND SOFT TISSUES: Upper abdominal surgical clips noted. No acute osseous abnormality. IMPRESSION: 1. Redeveloping small right pleural effusion with increased right base airspace disease,  most likely atelectasis. 2. No pneumothorax. Electronically signed by: Rockey Kilts MD 01/29/2024 11:11 AM EST RP Workstation: [id number]   US  THORACENTESIS ASP PLEURAL SPACE W/IMG GUIDE Result Date: 01/26/2024 INDICATION: 73 year old female presents with shortness of breath. Received request for diagnostic and therapeutic thoracentesis. EXAM: ULTRASOUND GUIDED RIGHT THORACENTESIS MEDICATIONS: 8 mL 1% lidocaine  COMPLICATIONS: None immediate. PROCEDURE: An ultrasound guided thoracentesis was thoroughly discussed with the patient and questions answered. The benefits, risks, alternatives and  complications were also discussed. The patient understands and wishes to proceed with the procedure. Written consent was obtained. Ultrasound was performed to localize and mark an adequate pocket of fluid in the right chest. The area was then prepped and draped in the normal sterile fashion. 1% lidocaine  was used for local anesthesia. Under ultrasound guidance a 6 Fr Safe-T-Centesis catheter was introduced. Thoracentesis was performed. The catheter was removed and a dressing applied. FINDINGS: A total of approximately 900 mL of clear yellow fluid was removed. Samples were sent to the laboratory as requested by the clinical team. IMPRESSION: Successful ultrasound guided right thoracentesis yielding 900 mL of pleural fluid. Performed by: Rayfield Buff, NP Electronically Signed   By: Cordella Banner   On: 01/26/2024 14:49   DG Chest Port 1 View Result Date: 01/26/2024 EXAM: 1 VIEW(S) XRAY OF THE CHEST 01/26/2024 01:02:00 PM COMPARISON: 01/25/2024 CLINICAL HISTORY: SOB (shortness of breath); Status post thoracentesis FINDINGS: LUNGS AND PLEURA: Evacuation of right pleural effusion with a small amount of residual fluid. Minimal overlying atelectasis. The left lung remains clear No post-procedural pneumothorax. No pulmonary edema. No pneumothorax. HEART AND MEDIASTINUM: Cardiomegaly with left subclavian approach cardiac rhythm maintenance device. Aortic arch atherosclerosis. BONES AND SOFT TISSUES: No acute osseous abnormality. IMPRESSION: 1. Evacuation of right pleural effusion with a small residual effusion and minimal overlying atelectasis. No pneumothorax. 2. No post-procedure or pneumothorax. Electronically signed by: Maude Stammer MD 01/26/2024 01:38 PM EST RP Workstation: [id number]   DG Chest Port 1 View Result Date: 01/25/2024 EXAM: 1 VIEW(S) XRAY OF THE CHEST 01/25/2024 09:41:07 AM COMPARISON: 01/24/2024 CLINICAL HISTORY: Shortness of breath FINDINGS: LUNGS AND PLEURA: Small right pleural effusion.  Increased right lower and developing inferior right upper lobe airspace disease. Diffuse interstitial thickening. No pneumothorax. HEART AND MEDIASTINUM: Left subclavian dual lead pacemaker unchanged. Cardiomegaly. BONES AND SOFT TISSUES: No acute osseous abnormality. IMPRESSION: 1. Persistent small right pleural effusion with increased right lower and developing inferior right upper airspace disease, favoring pneumonia 2. Cardiomegaly with increased interstitial thickening, favoring mild pulmonary edema Electronically signed by: Rockey Kilts MD 01/25/2024 05:05 PM EST RP Workstation: [id number]   US  Venous Img Lower Bilateral (DVT) Result Date: 01/24/2024 CLINICAL DATA:  Pulmonary embolism. EXAM: BILATERAL LOWER EXTREMITY VENOUS DOPPLER ULTRASOUND TECHNIQUE: Gray-scale sonography with compression, as well as color and duplex ultrasound, were performed to evaluate the deep venous system(s) from the level of the common femoral vein through the popliteal and proximal calf veins. COMPARISON:  None Available. FINDINGS: VENOUS Normal compressibility of the common femoral, superficial femoral, and popliteal veins, as well as the visualized calf veins. Visualized portions of profunda femoral vein and great saphenous vein unremarkable. No filling defects to suggest DVT on grayscale or color Doppler imaging. Doppler waveforms show normal direction of venous flow, normal respiratory plasticity and response to augmentation. Limited views of the contralateral common femoral vein are unremarkable. OTHER None. Limitations: none IMPRESSION: Negative. Electronically Signed   By: Leita Birmingham M.D.   On: 01/24/2024 14:19   CT Angio Chest  PE W and/or Wo Contrast Addendum Date: 01/24/2024 ** ADDENDUM #1 ** ADDENDUM: The above findings were discussed with Dr. Levander at 9:53 am 01/24/24. ---------------------------------------------------- Electronically signed by: Evalene Coho MD 01/24/2024 10:49 AM EST RP Workstation: [id number]    Result Date: 01/24/2024 ** ORIGINAL REPORT ** EXAM: CTA of the Chest with contrast for PE 01/24/2024 09:25:18 AM TECHNIQUE: CTA of the chest was performed without and with the administration of 75 mL of iohexol  (OMNIPAQUE ) 350 MG/ML injection. Multiplanar reformatted images are provided for review. MIP images are provided for review. Automated exposure control, iterative reconstruction, and/or weight based adjustment of the mA/kV was utilized to reduce the radiation dose to as low as reasonably achievable. COMPARISON: PA and lateral radiographs of the chest dated 01/24/2024. CLINICAL HISTORY: Pulmonary embolism (PE) suspected, low to intermediate prob, positive D-dimer. FINDINGS: PULMONARY ARTERIES: Pulmonary arteries are adequately opacified for evaluation. There is nonocclusive thromboembolic disease present within the distal right pulmonary artery and within the right lower lobe artery and anterior segmental branch of the right lower lobe pulmonary artery. There is also thromboembolus within the posterior segmental artery. There is no definite thromboembolic disease demonstrated in the left lung. Main pulmonary artery is normal in caliber. MEDIASTINUM: The heart is enlarged. There is no evidence of right ventricular strain. There is mild calcific coronary artery disease. The thoracic aorta demonstrates mild-to-moderate calcific atheromatous disease. The ascending thoracic aorta measures approximately 3.3 cm in diameter. LYMPH NODES: There are numerous enlarged mediastinal lymph nodes. There is also right hilar lymphadenopathy. No axillary lymphadenopathy. LUNGS AND PLEURA: There is mild dependent atelectasis within the right lung. There is a moderate right-sided pleural effusion. No pneumothorax. No focal consolidation or pulmonary edema. UPPER ABDOMEN: Limited images of the upper abdomen are unremarkable. SOFT TISSUES AND BONES: No acute bone or soft tissue abnormality. IMPRESSION: 1. Nonocclusive pulmonary  emboli involving the distal right pulmonary artery, right lower lobe artery, and anterior and posterior segmental branches; no left-sided PE identified. No CT evidence of right heart strain. 2. Moderate right pleural effusion. 3. Cardiomegaly with mild coronary artery calcifications and mild-to-moderate thoracic aortic atherosclerosis. 4. Enlarged mediastinal and right hilar lymph nodes; recommend clinical correlation and follow-up per oncologic/infectious/inflammatory considerations. Electronically signed by: Evalene Coho MD 01/24/2024 09:51 AM EST RP Workstation: [id number]   DG Chest 2 View Result Date: 01/24/2024 CLINICAL DATA:  Shortness of breath. EXAM: CHEST - 2 VIEW COMPARISON:  08/08/2020 FINDINGS: Mild cardiomegaly, with dual lead pacemaker in place. Small to moderate right pleural effusion. Right basilar atelectasis versus infiltrate. IMPRESSION: Small to moderate right pleural effusion, with right basilar atelectasis versus infiltrate. Electronically Signed   By: Norleen DELENA Kil M.D.   On: 01/24/2024 08:29    ECHO pending  TELEMETRY (personally reviewed): VP rate 70s  EKG (personally reviewed): VP rate 69 bpm  Data reviewed by me 02/10/2024: last 24h vitals tele labs imaging I/O ED provider note, admission H&P  Principal Problem:   CAP (community acquired pneumonia) Active Problems:   Cardiac arrest [facility])    ASSESSMENT AND PLAN:  ALLIX BLOMQUIST is a 73 y.o. female  with a past medical history of chronic HFrEF, s/p CRT-P 07/2021, persistent atrial fibrillation, hypertension, type 2 diabetes who presented to the ED on 02/09/2024 for shortness of breath. CTA chest concerning for PNA. Overnight had PEA arrest x2 with ROSC. Cardiology was consulted for further evaluation.   # PEA arrest # Pneumonia # Acute on chronic HFrEF # S/p CRT-P 07/2021 # Persistent atrial fibrillation Patient  initially presented with shortness of breath, concern for pneumonia on CTA chest.  Overnight last night  she had PEA arrest with ROSC then shortly afterwards arrested again.  Intubated in the ED.  Troponins mildly elevated and flat trending.  BNP significantly elevated at 15,900. EKG without acute ischemic changes. -Echo pending review.  -S/p IV lasix  60 mg today. Recommend continuing BID.  -GDMT meds currently held. Can consider coreg .  -Mildly elevated and flat troponins most consistent with demand/supply mismatch and not ACS.  -Further management per PCCM.   This patient's plan of care was discussed and created with Dr. Florencio and he is in agreement.  Signed: Danita Bloch, PA-C  02/10/2024, 11:56 AM [facility]

## 2024-02-10 NOTE — Progress Notes (Signed)
 Initial Nutrition Assessment  DOCUMENTATION CODES:   Not applicable  INTERVENTION:   If pt does not extubate, recommend:  Vital 1.2@50ml /hr- Initiate at 80ml/hr and increase by 10ml/hr q 8 hours until goal rate is reached.   ProSource TF 20- Give 60ml daily via tube, each supplement provides 80kcal and 20g of protein.   Free water flushes 30ml q4 hours to maintain tube patency   Regimen provides 1520kcal/day, 110g/day protein and 1158ml/day of free water.   Pt is at refeed risk; recommend monitor potassium, magnesium  and phosphorus labs daily until stable  Juven Fruit Punch BID via tube, each serving provides 95kcal and 2.5g of protein (amino acids glutamine and arginine)  Daily weights   NUTRITION DIAGNOSIS:   Inadequate oral intake related to inability to eat (pt sedated and ventilated) as evidenced by NPO status.  GOAL:   Provide needs based on ASPEN/SCCM guidelines  MONITOR:   Vent status, Labs, Weight trends, TF tolerance, I & O's, Skin  REASON FOR ASSESSMENT:   Consult Enteral/tube feeding initiation and management  ASSESSMENT:   73 y/o female with h/o HTN, AFib, nonischemic cardiomyopathy, PPM-ICD, CHF, PE, CHF, PAF, DM, hiatal hernia, hypertensive gastropathy, NAFLD, gout and CKD III who is admitted with CAP, pleural effusions with mediastinal lymphadenopathy, CHF exacerbation and AKI complicated by PEA arrest.  -Pt s/p thoracentesis 11/4 ( ) & 11/7 ( )  Pt sedated and ventilated. OGT in place. Pt undergoing SBTs currently. Will plan to initiate tube feeds if pt does not extubate. Per chart, pt appears weight stable pta. Pt is currently up ~7lbs from her UBW.   Medications reviewed and include: insulin , pepcid, lasix , oxycodone , miralax , unasyn  Labs reviewed: K 4.4 wnl, BUN 34(H), creat 1.44(H), P 4.0 wnl, Mg 2.7(H) Pro-BNP- 15,914(H) Wbc- 16.3(H) Cbgs- 114, 144, 235, 273 x 24 hrs  AIC 8.0(H)- 11/2  Patient is currently intubated on ventilator  support MV: 8.7 L/min Temp (24hrs), Avg:99.1 F (37.3 C), Min:97.7 F (36.5 C), Max:101.1 F (38.4 C)  MAP >83mmHg   UOP-   NUTRITION - FOCUSED PHYSICAL EXAM:  Flowsheet Row Most Recent Value  Orbital Region No depletion  Upper Arm Region No depletion  Thoracic and Lumbar Region No depletion  Buccal Region No depletion  Temple Region No depletion  Clavicle Bone Region No depletion  Clavicle and Acromion Bone Region No depletion  Scapular Bone Region No depletion  Dorsal Hand No depletion  Patellar Region No depletion  Anterior Thigh Region No depletion  Posterior Calf Region No depletion  Edema (RD Assessment) Mild  Hair Reviewed  Eyes Reviewed  Mouth Reviewed  Skin Reviewed  Nails Reviewed   Diet Order:   Diet Order             Diet NPO time specified  Diet effective now                  EDUCATION NEEDS:   No education needs have been identified at this time  Skin:  Skin Assessment: Reviewed RN Assessment (Stage I sacrum)  Last BM:  pta  Height:   Ht Readings from Last 1 Encounters:  02/10/24 5' 1 (1.549 m)    Weight:   Wt Readings from Last 1 Encounters:  02/10/24 70.5 kg    Ideal Body Weight:  47.7 kg  BMI:  Body mass index is 29.37 kg/m.  Estimated Nutritional Needs:   Kcal:  1568kcal/day  Protein:  100-115g/day  Fluid:  1.2-1.4L/day  Augustin Shams MS, RD, LDN If  unable to be reached, please send secure chat to RD inpatient available from 8:00a-4:00p daily

## 2024-02-10 NOTE — Progress Notes (Addendum)
 PHARMACY CONSULT NOTE - FOLLOW UP  Pharmacy Consult for Electrolyte Monitoring and Replacement   Recent Labs: Potassium (mmol/L)  Date Value  02/10/2024 4.4   Magnesium  (mg/dL)  Date Value  88/80/7974 2.7 (H)   Calcium  (mg/dL)  Date Value  88/80/7974 9.2   Albumin (g/dL)  Date Value  88/80/7974 3.3 (L)   Phosphorus (mg/dL)  Date Value  88/80/7974 4.0   Sodium (mmol/L)  Date Value  02/10/2024 134 (L)    Assessment: 73 y/o female with h/o HTN, AFib, nonischemic cardiomyopathy, PPM-ICD, CHF, PE, CHF, PAF, DM, hiatal hernia, hypertensive gastropathy, NAFLD, gout and CKD III who is admitted with CAP, pleural effusions with mediastinal lymphadenopathy, CHF exacerbation and AKI complicated by PEA arrest. Pharmacy is asked to follow and replace electrolytes while in CCU  Diuretics: furosemide  60 mg IV BID  Goal of Therapy:  Potassium 4.0 - 5.1 mmol/L Magnesium  2.0 - 2.4 mg/dL All Other Electrolytes WNL  Plan:  ---no electrolyte replacement warranted for today ---recheck electrolytes in am  Deborah Dawson ,PharmD Clinical Pharmacist 02/10/2024 7:18 AM

## 2024-02-11 ENCOUNTER — Inpatient Hospital Stay

## 2024-02-11 ENCOUNTER — Other Ambulatory Visit: Payer: Self-pay

## 2024-02-11 DIAGNOSIS — J9601 Acute respiratory failure with hypoxia: Secondary | ICD-10-CM | POA: Diagnosis not present

## 2024-02-11 DIAGNOSIS — I5022 Chronic systolic (congestive) heart failure: Secondary | ICD-10-CM | POA: Diagnosis not present

## 2024-02-11 DIAGNOSIS — I469 Cardiac arrest, cause unspecified: Secondary | ICD-10-CM | POA: Diagnosis not present

## 2024-02-11 DIAGNOSIS — J90 Pleural effusion, not elsewhere classified: Secondary | ICD-10-CM | POA: Diagnosis not present

## 2024-02-11 LAB — GLUCOSE, CAPILLARY
Glucose-Capillary: 147 mg/dL — ABNORMAL HIGH (ref 70–99)
Glucose-Capillary: 159 mg/dL — ABNORMAL HIGH (ref 70–99)
Glucose-Capillary: 143 mg/dL — ABNORMAL HIGH (ref 70–99)
Glucose-Capillary: 119 mg/dL — ABNORMAL HIGH (ref 70–99)
Glucose-Capillary: 136 mg/dL — ABNORMAL HIGH (ref 70–99)

## 2024-02-11 LAB — CBC
HCT: 42.3 % (ref 36.0–46.0)
Hemoglobin: 14.2 g/dL (ref 12.0–15.0)
MCH: 28.6 pg (ref 26.0–34.0)
MCHC: 33.6 g/dL (ref 30.0–36.0)
MCV: 85.1 fL (ref 80.0–100.0)
Platelets: 192 K/uL (ref 150–400)
RBC: 4.97 MIL/uL (ref 3.87–5.11)
RDW: 16.2 % — ABNORMAL HIGH (ref 11.5–15.5)
WBC: 11.1 K/uL — ABNORMAL HIGH (ref 4.0–10.5)
nRBC: 0 % (ref 0.0–0.2)

## 2024-02-11 LAB — BODY FLUID CELL COUNT WITH DIFFERENTIAL
Eos, Fluid: 0 %
Lymphs, Fluid: 50 %
Monocyte-Macrophage-Serous Fluid: 29 %
Neutrophil Count, Fluid: 21 %
Total Nucleated Cell Count, Fluid: 1524 uL

## 2024-02-11 LAB — RENAL FUNCTION PANEL
Albumin: 3.2 g/dL — ABNORMAL LOW (ref 3.5–5.0)
Anion gap: 15 (ref 5–15)
BUN: 40 mg/dL — ABNORMAL HIGH (ref 8–23)
CO2: 23 mmol/L (ref 22–32)
Calcium: 8.9 mg/dL (ref 8.9–10.3)
Chloride: 100 mmol/L (ref 98–111)
Creatinine, Ser: 1.92 mg/dL — ABNORMAL HIGH (ref 0.44–1.00)
GFR, Estimated: 27 mL/min — ABNORMAL LOW (ref >60)
Glucose, Bld: 152 mg/dL — ABNORMAL HIGH (ref 70–99)
Phosphorus: 4.7 mg/dL — ABNORMAL HIGH (ref 2.5–4.6)
Potassium: 4.7 mmol/L (ref 3.5–5.1)
Sodium: 137 mmol/L (ref 135–145)

## 2024-02-11 LAB — TROPONIN T, HIGH SENSITIVITY
Troponin T High Sensitivity: 150 ng/L (ref 0–19)
Troponin T High Sensitivity: 149 ng/L (ref 0–19)

## 2024-02-11 LAB — PROTEIN, PLEURAL OR PERITONEAL FLUID: Total protein, fluid: 3.4 g/dL

## 2024-02-11 LAB — LACTATE DEHYDROGENASE, PLEURAL OR PERITONEAL FLUID: LD, Fluid: 95 U/L — ABNORMAL HIGH (ref 3–23)

## 2024-02-11 LAB — MAGNESIUM
Magnesium: 2.1 mg/dL (ref 1.7–2.4)
Magnesium: 2.1 mg/dL (ref 1.7–2.4)

## 2024-02-11 LAB — PROCALCITONIN: Procalcitonin: 1.46 ng/mL

## 2024-02-11 LAB — GLUCOSE, PLEURAL OR PERITONEAL FLUID: Glucose, Fluid: 147 mg/dL

## 2024-02-11 LAB — POTASSIUM: Potassium: 4.3 mmol/L (ref 3.5–5.1)

## 2024-02-11 MED ORDER — OXYCODONE HCL 5 MG PO TABS: 5.0000 mg | ORAL_TABLET | Freq: Four times a day (QID) | ORAL | Status: DC | PRN

## 2024-02-11 MED ORDER — ORAL CARE MOUTH RINSE: 15.0000 mL | OROMUCOSAL | Status: DC | PRN

## 2024-02-11 MED ORDER — POLYETHYLENE GLYCOL 3350 17 G PO PACK: 17.0000 g | PACK | Freq: Every day | ORAL | Status: DC | PRN

## 2024-02-11 MED ORDER — ADULT MULTIVITAMIN W/MINERALS CH
1.0000 | ORAL_TABLET | Freq: Every day | ORAL | Status: DC
  Administered 2024-02-12 – 2024-02-22 (×10): 1 via ORAL
  Filled 2024-02-11 (×10): qty 1

## 2024-02-11 MED ORDER — ACETAMINOPHEN 650 MG RE SUPP: 650.0000 mg | Freq: Four times a day (QID) | RECTAL | Status: DC

## 2024-02-11 MED ORDER — CARVEDILOL 3.125 MG PO TABS
3.1250 mg | ORAL_TABLET | Freq: Two times a day (BID) | ORAL | Status: DC
  Administered 2024-02-11 – 2024-02-17 (×11): 3.125 mg via ORAL
  Filled 2024-02-11 (×12): qty 1

## 2024-02-11 MED ORDER — FAMOTIDINE 20 MG PO TABS
20.0000 mg | ORAL_TABLET | Freq: Every day | ORAL | Status: DC
  Administered 2024-02-11: 20 mg
  Filled 2024-02-11: qty 1

## 2024-02-11 MED ORDER — ACETAMINOPHEN 325 MG PO TABS
650.0000 mg | ORAL_TABLET | Freq: Four times a day (QID) | ORAL | Status: DC
  Administered 2024-02-11 – 2024-02-12 (×4): 650 mg via ORAL
  Filled 2024-02-11 (×5): qty 2

## 2024-02-11 MED ORDER — ENSURE PLUS HIGH PROTEIN PO LIQD
237.0000 mL | Freq: Two times a day (BID) | ORAL | Status: DC
  Administered 2024-02-12 – 2024-02-20 (×11): 237 mL via ORAL

## 2024-02-11 MED ORDER — FAMOTIDINE 20 MG PO TABS: 20.0000 mg | ORAL_TABLET | Freq: Every day | ORAL | Status: DC

## 2024-02-11 MED ORDER — INSULIN ASPART 100 UNIT/ML IJ SOLN
0.0000 [IU] | Freq: Three times a day (TID) | INTRAMUSCULAR | Status: DC
  Administered 2024-02-13 (×2): 2 [IU] via SUBCUTANEOUS
  Filled 2024-02-11: qty 2
  Filled 2024-02-11: qty 3
  Filled 2024-02-11: qty 2

## 2024-02-11 MED ORDER — SODIUM CHLORIDE 0.9 % IV SOLN
3.0000 g | Freq: Two times a day (BID) | INTRAVENOUS | Status: DC
  Administered 2024-02-11 – 2024-02-15 (×10): 3 g via INTRAVENOUS
  Filled 2024-02-11 (×13): qty 8

## 2024-02-11 MED ORDER — FUROSEMIDE 10 MG/ML IJ SOLN: 60.0000 mg | Freq: Every day | INTRAMUSCULAR | Status: DC

## 2024-02-11 MED ORDER — JUVEN PO PACK
1.0000 | PACK | Freq: Two times a day (BID) | ORAL | Status: DC
  Administered 2024-02-11: 1 via ORAL

## 2024-02-11 MED ORDER — HYDROCORTISONE 1 % EX CREA
TOPICAL_CREAM | Freq: Three times a day (TID) | CUTANEOUS | Status: DC | PRN
  Filled 2024-02-11: qty 28

## 2024-02-11 MED ORDER — POLYETHYLENE GLYCOL 3350 17 G PO PACK: 17.0000 g | PACK | Freq: Every day | ORAL | Status: DC

## 2024-02-11 MED ORDER — VITAMIN C 500 MG PO TABS
500.0000 mg | ORAL_TABLET | Freq: Two times a day (BID) | ORAL | Status: DC
  Administered 2024-02-11 – 2024-02-24 (×24): 500 mg via ORAL
  Filled 2024-02-11 (×25): qty 1

## 2024-02-11 MED ORDER — MENTHOL 3 MG MT LOZG
1.0000 | LOZENGE | OROMUCOSAL | Status: DC | PRN
  Administered 2024-02-11: 3 mg via ORAL
  Filled 2024-02-11: qty 9

## 2024-02-11 NOTE — Progress Notes (Signed)
 PHARMACY NOTE:  ANTIMICROBIAL RENAL DOSAGE ADJUSTMENT  Current antimicrobial regimen includes a mismatch between antimicrobial dosage and estimated renal function.  As per policy approved by the Pharmacy & Therapeutics and Medical Executive Committees, the antimicrobial dosage will be adjusted accordingly.  Current antimicrobial dosage:  Ampicillin-Sulbactam 3 grams IV every 6 hours  Indication: PNA  Renal Function:  Estimated Creatinine Clearance: 22.9 mL/min (A) (by C-G formula based on SCr of 1.92 mg/dL (H)).    Antimicrobial dosage has been changed to:  Ampicillin-Sulbactam 3 grams IV every 12 hours  Thank you for allowing pharmacy to be a part of this patient's care.  Adriana JONETTA Bolster, [facility] 02/11/2024 7:53 AM

## 2024-02-11 NOTE — Evaluation (Signed)
 Physical Therapy Evaluation Patient Details Name: Deborah Dawson MRN: [id number] DOB: [birth year] Today's Date: 02/11/2024  History of Present Illness  Pt is a 73 y/o F presenting to ED with c/o worsening dyspnea. Code blue x2 in ED, received 2 rounds of chest compressions and was subsequently intubated. MD assessment includes CAP, recurrent pleural effusions, mild hyperkalemia. Pt underwent thoracocentesis on 02/11/24. PMH significant for PAF, PE on Xarelto, chronic HFrEF LVEF 25%, recurrent R sided pleural effusion, HTN, IDDM, gout.   Clinical Impression  Pt A&Ox4, pleasant and agreeable to participate in PT evaluation. At baseline, pt is IND with mobility/ADLs, denies previous AD use or hx of falls. Pt was received in bed, able to perform bed mobility with minA with VC for logroll sequencing for comfort. Pt able to tolerate sitting at EOB for ~10 minutes during session, began to report feeling lightheaded with increased time. BP noted to drop from 130/74 initially to 108/64 after supine > sit transfer- RN notified. Additional OOB mobility deferred due to pt's symptoms of lightheadedness and significant rib/chest pain. Pt able to laterally scoot toward [facility] with CGA. HR and SpO2 remained WFL throughout with pt on RA. Pt was left in bed-in-chair position with all needs in reach. Pt is currently displaying deficits in activity tolerance, strength, and endurance impacting her ability to participate in functional mobility/ADLs, would benefit from skilled PT intervention to address listed deficits and allow for safe return to PLOF.         If plan is discharge home, recommend the following: A little help with walking and/or transfers;A little help with bathing/dressing/bathroom;Assist for transportation;Help with stairs or ramp for entrance   Can travel by private vehicle   No    Equipment Recommendations Other (comment) (TBD at next venue of care)  Recommendations for Other Services        Functional Status Assessment Patient has had a recent decline in their functional status and demonstrates the ability to make significant improvements in function in a reasonable and predictable amount of time.     Precautions / Restrictions Precautions Precautions: Fall Recall of Precautions/Restrictions: Intact Precaution/Restrictions Comments: Per nursing orders keep HOB elevated >30 deg. chest/rib pain Restrictions Weight Bearing Restrictions Per Provider Order: No      Mobility  Bed Mobility Overal bed mobility: Needs Assistance Bed Mobility: Rolling, Sidelying to Sit, Sit to Sidelying Rolling: Supervision, Used rails Sidelying to sit: Min assist, HOB elevated, Used rails     Sit to sidelying: Min assist General bed mobility comments: minA trunk assist to exit bed, minA to return BLE to bed. VC for logroll sequencing to limit abdominal straining    Transfers Overall transfer level: Needs assistance Equipment used: None Transfers: Bed to chair/wheelchair/BSC            Lateral/Scoot Transfers: Contact guard assist General transfer comment: Pt able to laterally scoot toward HOB with CGA, additional OOB mobility deferred due to orthostasis and pt reporting lightheadedness    Ambulation/Gait               General Gait Details: Deferred at eval due to orthostasis  Stairs            Wheelchair Mobility     Tilt Bed    Modified Rankin (Stroke Patients Only)       Balance Overall balance assessment: Needs assistance Sitting-balance support: Feet unsupported, Bilateral upper extremity supported Sitting balance-Leahy Scale: Fair Sitting balance - Comments: minA required initially in sitting, able to progress to  supervision                                     Pertinent Vitals/Pain Pain Assessment Pain Assessment: Faces Faces Pain Scale: Hurts whole lot Pain Location: chest, R side ribs Pain Descriptors / Indicators: Sore,  Grimacing Pain Intervention(s): Monitored during session, Repositioned    Home Living Family/patient expects to be discharged to:: Private residence Living Arrangements: Spouse/significant other Available Help at Discharge: Family;Available PRN/intermittently Type of Home: House Home Access: Stairs to enter Entrance Stairs-Rails: Doctor, General Practice of Steps: 5   Home Layout: One level Home Equipment: Standard Walker;Cane - single point      Prior Function Prior Level of Function : Independent/Modified Independent             Mobility Comments: IND with mobility, no previous AD use, community ambulator, denies hx of falls ADLs Comments: IND with ADLs     Extremity/Trunk Assessment   Upper Extremity Assessment Upper Extremity Assessment: Generalized weakness    Lower Extremity Assessment Lower Extremity Assessment: Generalized weakness       Communication   Communication Communication: No apparent difficulties    Cognition Arousal: Alert Behavior During Therapy: WFL for tasks assessed/performed   PT - Cognitive impairments: No apparent impairments                       PT - Cognition Comments: A&Ox4, pleasant and cooperative Following commands: Intact       Cueing Cueing Techniques: Verbal cues, Visual cues     General Comments      Exercises Other Exercises Other Exercises: Vitals monitored throughout session with pt on RA throughout. BP 130/70 at start of session, with transfer to sitting EOB BP 109/68 with pt reporting lightheadedness. HR remained WFL throughout, SpO2 ranged from 94-100% throughout session.   Assessment/Plan    PT Assessment Patient needs continued PT services  PT Problem List Decreased strength;Decreased activity tolerance;Decreased balance;Decreased mobility;Decreased knowledge of use of DME;Pain       PT Treatment Interventions DME instruction;Gait training;Stair training;Functional mobility  training;Therapeutic activities;Therapeutic exercise;Balance training;Patient/family education    PT Goals (Current goals can be found in the Care Plan section)  Acute Rehab PT Goals Patient Stated Goal: to get better PT Goal Formulation: With patient Time For Goal Achievement: 02/25/24 Potential to Achieve Goals: Fair    Frequency Min 3X/week     Co-evaluation               AM-PAC PT 6 Clicks Mobility  Outcome Measure Help needed turning from your back to your side while in a flat bed without using bedrails?: A Little Help needed moving from lying on your back to sitting on the side of a flat bed without using bedrails?: A Little Help needed moving to and from a bed to a chair (including a wheelchair)?: A Little Help needed standing up from a chair using your arms (e.g., wheelchair or bedside chair)?: A Little Help needed to walk in hospital room?: A Lot Help needed climbing 3-5 steps with a railing? : A Lot 6 Click Score: 16    End of Session   Activity Tolerance: Patient limited by fatigue;Patient limited by pain Patient left: in bed;with call bell/phone within reach;with bed alarm set (bed in chair position) Nurse Communication: Mobility status PT Visit Diagnosis: Other abnormalities of gait and mobility (R26.89);Muscle weakness (generalized) (M62.81);Pain Pain -  part of body:  (ribs and chest)    Time: 8364-8341 PT Time Calculation (min) (ACUTE ONLY): 23 min   Charges:   PT Evaluation $PT Eval Moderate Complexity: 1 Mod   PT General Charges $$ ACUTE PT VISIT: 1 Visit         Janell Axe, SPT

## 2024-02-11 NOTE — Care Management Important Message (Signed)
 Important Message  Patient Details  Name: Deborah Dawson MRN: [id number] Date of Birth: May 06, 1950   Important Message Given:  Yes - Medicare IM     [name] 02/11/2024, 3:01 PM

## 2024-02-11 NOTE — Procedures (Signed)
 Thoracentesis  Procedure Note  Deborah Dawson  [id number]  [birth year]  Date:02/11/24  Time:12:27 PM   Provider Performing:Jean-Pierre Jodie Cavey   Procedure: Thoracentesis with imaging guidance (67444)  Indication(s) Pleural Effusion  Consent Risks of the procedure as well as the alternatives and risks of each were explained to the patient and/or caregiver.  Consent for the procedure was obtained and is signed in the bedside chart  Anesthesia Topical only with 1% lidocaine     Time Out Verified patient identification, verified procedure, site/side was marked, verified correct patient position, special equipment/implants available, medications/allergies/relevant history reviewed, required imaging and test results available.   Sterile Technique Maximal sterile technique including full sterile barrier drape, hand hygiene, sterile gown, sterile gloves, mask, hair covering, sterile ultrasound probe cover (if used).  Procedure Description Ultrasound was used to identify appropriate pleural anatomy for placement and overlying skin marked.  Area of drainage cleaned and draped in sterile fashion. Lidocaine  was used to anesthetize the skin and subcutaneous tissue.  700 cc's of Dark Amber appearing fluid was drained from the right pleural space. Catheter then removed and bandaid applied to site.   Complications/Tolerance None; patient tolerated the procedure well. Chest X-ray is ordered to confirm no post-procedural complication.  EBL Minimal  Specimen(s) Pleural fluid  Darrin Barn, MD [place] [facility] 02/11/2024 12:28 PM

## 2024-02-11 NOTE — Progress Notes (Signed)
 Patient extubated, to 3 LNC, with no complications, per order. Saturatuion on 3L us  97%.

## 2024-02-11 NOTE — Plan of Care (Signed)
  Problem: Fluid Volume: Goal: Ability to maintain a balanced intake and output will improve Outcome: Progressing   Problem: Nutritional: Goal: Maintenance of adequate nutrition will improve Outcome: Progressing   Problem: Skin Integrity: Goal: Risk for impaired skin integrity will decrease Outcome: Progressing   Problem: Tissue Perfusion: Goal: Adequacy of tissue perfusion will improve Outcome: Progressing   Problem: Respiratory: Goal: Ability to maintain a clear airway and adequate ventilation will improve Outcome: Progressing   Problem: Clinical Measurements: Goal: Respiratory complications will improve Outcome: Progressing Goal: Cardiovascular complication will be avoided Outcome: Progressing   Problem: Coping: Goal: Level of anxiety will decrease Outcome: Progressing   Problem: Elimination: Goal: Will not experience complications related to urinary retention Outcome: Progressing

## 2024-02-11 NOTE — Progress Notes (Signed)
 PHARMACY CONSULT NOTE - FOLLOW UP  Pharmacy Consult for Electrolyte Monitoring and Replacement   Recent Labs: Potassium (mmol/L)  Date Value  02/11/2024 4.7   Magnesium  (mg/dL)  Date Value  88/79/7974 2.1   Calcium  (mg/dL)  Date Value  88/79/7974 8.9   Albumin (g/dL)  Date Value  88/79/7974 3.2 (L)   Phosphorus (mg/dL)  Date Value  88/79/7974 4.7 (H)   Sodium (mmol/L)  Date Value  02/11/2024 137    Assessment: 73 y/o female with h/o HTN, AFib, nonischemic cardiomyopathy, PPM-ICD, CHF, PE, CHF, PAF, DM, hiatal hernia, hypertensive gastropathy, NAFLD, gout and CKD III who is admitted with CAP, pleural effusions with mediastinal lymphadenopathy, CHF exacerbation and AKI complicated by PEA arrest. Pharmacy is asked to follow and replace electrolytes while in CCU  Diuretics: furosemide  60 mg IV daily  Goal of Therapy:  Potassium 4.0 - 5.1 mmol/L Magnesium  2.0 - 2.4 mg/dL All Other Electrolytes WNL  Plan:  ---no electrolyte replacement warranted for today ---recheck electrolytes in am  Deborah Dawson ,PharmD Clinical Pharmacist 02/11/2024 7:14 AM

## 2024-02-11 NOTE — Progress Notes (Signed)
 [facility] CLINIC CARDIOLOGY PROGRESS NOTE       Patient ID: Deborah Dawson MRN: [id number] DOB/AGE: [birth year] 73 y.o.  Admit date: 02/09/2024 Referring Physician Inge Lecher, NP Primary Physician Sadie Manna, MD  Primary Cardiologist Dr. Ammon Reason for Consultation post-PEA arrest  HPI: Deborah Dawson is a 73 y.o. female  with a past medical history of chronic HFrEF, s/p CRT-P 07/2021, persistent atrial fibrillation, hypertension, type 2 diabetes who presented to the ED on 02/09/2024 for shortness of breath. CTA chest concerning for PNA. Overnight had PEA arrest x2 with ROSC. Cardiology was consulted for further evaluation.   Interval history: - Patient seen and examined this morning, family at bedside. - She was extubated shortly before my evaluation and is currently on 3 L nasal cannula. - She denies any chest pain, shortness of breath. - Reviewed echo results with patient and family, overall stable compared to prior. - Lower extremity edema relatively improved today  Review of systems complete and found to be negative unless listed above    Past Medical History:  Diagnosis Date   Allergic genetic state    Atrial fibrillation (HCC) 2010   Cardiomyopathy (HCC)    CHF (congestive heart failure) (HCC)    Diabetes mellitus without complication (HCC)    Dysrhythmia    Gout    H/O cardiac catheterization    Hypertension    Joint pain    Osteopenia    Presence of permanent cardiac pacemaker    Psoriasis     Past Surgical History:  Procedure Laterality Date   ABDOMINAL HYSTERECTOMY  1985   BREAST EXCISIONAL BIOPSY Left    negative years ago   BREAST SURGERY Left 1997   papilloma   CARDIAC CATHETERIZATION Left 08/01/2015   Procedure: Left Heart Cath and Coronary Angiography;  Surgeon: Vinie DELENA Jude, MD;  Location: [facility] INVASIVE CV LAB;  Service: Cardiovascular;  Laterality: Left;   CHOLECYSTECTOMY  1986   COLONOSCOPY WITH PROPOFOL  N/A 08/26/2017   Procedure:  COLONOSCOPY WITH PROPOFOL ;  Surgeon: Toledo, Ladell POUR, MD;  Location: [facility] ENDOSCOPY;  Service: Gastroenterology;  Laterality: N/A;   ESOPHAGOGASTRODUODENOSCOPY (EGD) WITH PROPOFOL  N/A 08/26/2017   Procedure: ESOPHAGOGASTRODUODENOSCOPY (EGD) WITH PROPOFOL ;  Surgeon: Toledo, Ladell POUR, MD;  Location: [facility] ENDOSCOPY;  Service: Gastroenterology;  Laterality: N/A;   ESOPHAGOGASTRODUODENOSCOPY (EGD) WITH PROPOFOL  N/A 07/04/2019   Procedure: ESOPHAGOGASTRODUODENOSCOPY (EGD) WITH PROPOFOL ;  Surgeon: Toledo, Ladell POUR, MD;  Location: [facility] ENDOSCOPY;  Service: Gastroenterology;  Laterality: N/A;   ESOPHAGOGASTRODUODENOSCOPY (EGD) WITH PROPOFOL  N/A 06/12/2023   Procedure: ESOPHAGOGASTRODUODENOSCOPY (EGD) WITH PROPOFOL ;  Surgeon: Maryruth Ole DASEN, MD;  Location: [facility] ENDOSCOPY;  Service: Endoscopy;  Laterality: N/A;  DM   TUBAL LIGATION  1981    Medications Prior to Admission  Medication Sig Dispense Refill Last Dose/Taking   carvedilol  (COREG ) 12.5 MG tablet Take 1 tablet (12.5 mg total) by mouth 2 (two) times daily with a meal. 60 tablet 0 02/09/2024 Morning   gabapentin  (NEURONTIN ) 100 MG capsule Take 100 mg by mouth 2 (two) times daily.   02/09/2024 Morning   insulin  glargine (LANTUS  SOLOSTAR) 100 UNIT/ML Solostar Pen Inject 20 Units into the skin at bedtime.   02/08/2024   isosorbide  mononitrate (IMDUR ) 30 MG 24 hr tablet Take 1 tablet (30 mg total) by mouth daily. 30 tablet 0 02/09/2024   Magnesium  Gluconate 550 MG TABS Take by mouth.   Taking   RIVAROXABAN  (XARELTO ) VTE STARTER PACK (15 & 20 MG) Follow package directions: Take one 15mg  tablet by  mouth twice a day. On day 22, switch to one 20mg  tablet once a day. Take with food. 51 each 0 02/09/2024 Morning   spironolactone (ALDACTONE) 25 MG tablet Take 1/2 tablet (12.5 mg total) by mouth daily. 30 tablet 0 02/09/2024   Vitamin D, Ergocalciferol, 2000 units CAPS Take 1 capsule by mouth daily.   Taking   fluticasone (FLONASE) 50 MCG/ACT nasal spray  Place 1 spray into both nostrils 2 (two) times daily. (Patient not taking: Reported on 01/24/2024)   Not Taking   Social History   Socioeconomic History   Marital status: Married    Spouse name: Not on file   Number of children: Not on file   Years of education: Not on file   Highest education level: Not on file  Occupational History   Not on file  Tobacco Use   Smoking status: Never   Smokeless tobacco: Never  Vaping Use   Vaping status: Never Used  Substance and Sexual Activity   Alcohol use: No    Alcohol/week: 0.0 standard drinks of alcohol   Drug use: No   Sexual activity: Not on file    Comment: Married   Other Topics Concern   Not on file  Social History Narrative   Not on file   Social Drivers of Health   Financial Resource Strain: Low Risk  (11/02/2023)   Received from [facility]   Overall Financial Resource Strain (CARDIA)    Difficulty of Paying Living Expenses: Not hard at all  Food Insecurity: Patient Unable To Answer (02/10/2024)   Hunger Vital Sign    Worried About Running Out of Food in the Last Year: Patient unable to answer    Ran Out of Food in the Last Year: Patient unable to answer  Transportation Needs: Patient Unable To Answer (02/10/2024)   PRAPARE - Transportation    Lack of Transportation (Medical): Patient unable to answer    Lack of Transportation (Non-Medical): Patient unable to answer  Physical Activity: Not on file  Stress: Not on file  Social Connections: Patient Unable To Answer (02/10/2024)   Social Connection and Isolation Panel    Frequency of Communication with Friends and Family: Patient unable to answer    Frequency of Social Gatherings with Friends and Family: Patient unable to answer    Attends Religious Services: Patient unable to answer    Active Member of Clubs or Organizations: Patient unable to answer    Attends Banker Meetings: Patient unable to answer    Marital Status: Patient unable to  answer  Intimate Partner Violence: Patient Unable To Answer (02/10/2024)   Humiliation, Afraid, Rape, and Kick questionnaire    Fear of Current or Ex-Partner: Patient unable to answer    Emotionally Abused: Patient unable to answer    Physically Abused: Patient unable to answer    Sexually Abused: Patient unable to answer    Family History  Problem Relation Age of Onset   Cancer Mother 51       breast   Diabetes Mother    Breast cancer Mother 35   Coronary artery disease Mother    Stroke Father    Coronary artery disease Father    Diabetes Son      Vitals:   02/11/24 0400 02/11/24 0500 02/11/24 0600 02/11/24 0700  BP: 121/62 130/73 126/70 121/76  Pulse: 72 71 71 79  Resp: 20 20 (!) 22 20  Temp: 98.8 F (37.1 C) 99.3 F (37.4 C) 100  F (37.8 C) (!) 100.6 F (38.1 C)  TempSrc: Bladder     SpO2: 100%     Weight:  71.2 kg    Height:        PHYSICAL EXAM General: Chronically ill-appearing elderly female.  No acute distress. HEENT: Normocephalic and atraumatic. Neck: No JVD.  Lungs: Normal respiratory effort.  Mild bibasilar crackles. Heart: HRRR. Normal S1 and S2 without gallops or murmurs.  Abdomen: Non-distended appearing.  Msk: Normal strength and tone for age. Extremities: Warm and well perfused. No clubbing, cyanosis.  Trace pitting edema.  Neuro: Alert and oriented X 3. Psych: Answers questions appropriately.   Labs: Basic Metabolic Panel: Recent Labs    02/10/24 0213 02/11/24 0447 02/11/24 0500  NA 134* 137  --   K 4.4 4.7  --   CL 102 100  --   CO2 21* 23  --   GLUCOSE 270* 152*  --   BUN 34* 40*  --   CREATININE 1.44* 1.92*  --   CALCIUM  9.2 8.9  --   MG 2.7*  --  2.1  PHOS 4.0 4.7*  --    Liver Function Tests: Recent Labs    02/10/24 0213 02/11/24 0447  AST 93*  --   ALT 41  --   ALKPHOS 99  --   BILITOT 0.8  --   PROT 6.7  --   ALBUMIN 3.3* 3.2*   No results for input(s): LIPASE, AMYLASE in the last 72 hours. CBC: Recent Labs     02/10/24 0213 02/11/24 0500  WBC 16.3* 11.1*  HGB 13.7 14.2  HCT 42.9 42.3  MCV 90.1 85.1  PLT 216 192   Cardiac Enzymes: No results for input(s): CKTOTAL, CKMB, CKMBINDEX, TROPONINIHS in the last 72 hours. BNP: No results for input(s): BNP in the last 72 hours. D-Dimer: No results for input(s): DDIMER in the last 72 hours. Hemoglobin A1C: No results for input(s): HGBA1C in the last 72 hours. Fasting Lipid Panel: No results for input(s): CHOL, HDL, LDLCALC, TRIG, CHOLHDL, LDLDIRECT in the last 72 hours. Thyroid Function Tests: No results for input(s): TSH, T4TOTAL, T3FREE, THYROIDAB in the last 72 hours.  Invalid input(s): FREET3 Anemia Panel: No results for input(s): VITAMINB12, FOLATE, FERRITIN, TIBC, IRON, RETICCTPCT in the last 72 hours.   Radiology: DG Abd 1 View Result Date: 02/10/2024 EXAM: 1 VIEW XRAY OF THE ABDOMEN 02/10/2024 04:44:00 AM COMPARISON: 02/10/2024 CLINICAL HISTORY: Encounter for imaging study to confirm orogastric (OG) tube placement FINDINGS: LINES, TUBES AND DEVICES: Gastric tube in place with side hole at the level of the gastric body. Cardiac pacemaker noted. BOWEL: Nonobstructive bowel gas pattern. SOFT TISSUES: Surgical clips in the right upper quadrant. No opaque urinary calculi. BONES: No acute osseous abnormality. LUNG BASES: Interstitial and patchy airspace opacities in the partially visualized lung bases, probably atelectasis. IMPRESSION: 1. Satisfactory enteric tube placement into the stomach. Electronically signed by: Helayne Hurst MD 02/10/2024 05:25 AM EST RP Workstation: [id number]   DG Abd 1 View Result Date: 02/10/2024 EXAM: 1 VIEW XRAY OF THE ABDOMEN 02/10/2024 04:05:00 AM COMPARISON: None available. CLINICAL HISTORY: Encounter for imaging study to confirm orogastric (OG) tube placement. FINDINGS: LINES, TUBES AND DEVICES: Enteric tube in place with tip and side port overlying the expected  region of the gastric lumen. Cardiac pacemaker noted. BOWEL: Nonobstructive bowel gas pattern. SOFT TISSUES: No opaque urinary calculi. BONES: No acute osseous abnormality. LUNG BASES: Patchy airspace opacities in the partially visualized lung bases. IMPRESSION: 1. Enteric tube  in appropriate position with tip and side port overlying the expected region of the gastric lumen. 2. Patchy airspace opacities in the partially visualized lung bases. Electronically signed by: Dorethia Molt MD 02/10/2024 04:25 AM EST RP Workstation: [id number]   CT CHEST WO CONTRAST Result Date: 02/10/2024 EXAM: CT CHEST WITHOUT CONTRAST 02/10/2024 01:28:16 AM TECHNIQUE: CT of the chest was performed without the administration of intravenous contrast. Multiplanar reformatted images are provided for review. Automated exposure control, iterative reconstruction, and/or weight based adjustment of the mA/kV was utilized to reduce the radiation dose to as low as reasonably achievable. COMPARISON: Vertebrae 18 to 2025. CLINICAL HISTORY: CXR post cardiac arrest: predominantly within the right upper lobe, possibly representing acute pneumonic consolidation, pulmonary hemorrhage, or asymmetric alveolar pulmonary edema as could be seen with acute mitral valve regurgitation. FINDINGS: MEDIASTINUM: Heart: Mild coronary artery calcification. Mild cardiomegaly. A left subclavian dual-lead pacemaker is identified with leads within the left ventricular venous outflow and right ventricle toward the apex. No pericardial effusion. Vessels: The central pulmonary arteries are enlarged in keeping with changes of pulmonary arterial hypertension. Mild atherosclerotic calcification within the thoracic aorta. No aortic aneurysm. Airways: The central airways are clear. Interval endotracheal tube placement attempts 2.6 cm above the carina. Other: Interval nasogastric tube placement extending into the stomach beyond the margin of the exam. Visualized thyroid is  unremarkable. LYMPH NODES: Extensive mediastinal adenopathy is again identified and appears stable, nonspecific. This may be reactive, inflammatory as can be seen with conditions such as sarcoidosis, or lymphoproliferative in etiology. Comparison with interval examinations, if available, would be helpful in determining stability. If none are available, follow-up CT or PET CT examination would be helpful in 3 months to demonstrate stability or resolution. LUNGS AND PLEURA: Extensive asymmetric pulmonary ground-glass infiltrate, multiple consolidation, and smooth interlobular septal thickening, asymmetrically more severe within the right upper lobe. Given its relatively rapid development since prior examination, the findings are most suggestive of asymmetric pulmonary edema, though pulmonary hemorrhage could appear similarly. Extensive pneumonic infiltrate or changes of ARDS are considered less likely given the relatively rapid development. Moderate to large right pleural effusion again noted with compressive atelectasis of the right lower lobe. No pneumothorax. SOFT TISSUES/BONES: Acute fracture of the left seventh rib anterolaterally with minimal displacement. No other acute bone abnormality identified. Osseous structures are otherwise age appropriate. UPPER ABDOMEN: Limited images of the upper abdomen demonstrates no acute abnormality. IMPRESSION: 1. Extensive asymmetric pulmonary ground-glass infiltrate, multifocal consolidation, and smooth interlobular septal thickening, greatest in the right upper lobe, most suggestive of asymmetric pulmonary edema given rapid interval development; acute pulmonary hemorrhage is a differential consideration. Extensive pneumonic infiltrate or ARDS considered less likely. 2. Moderate to large right pleural effusion with compressive atelectasis of the right lower lobe. 3. Acute minimally displaced fracture of the left anterolateral seventh rib. 4. Thoracic adenopathy as outlined  above. This is nonspecific, but may be reactive, inflammatory as can be seen with conditions such as sarcoidosis, or lymphoproliferative in etiology. Comparison with interval examinations, if available, be helpful in determining stability. If none are available, follow-up CT or PET CT examination would be helpful in 3 months to demonstrate stability or resolution. 5. Findings consistent with pulmonary arterial hypertension (enlarged central pulmonary arteries). 6. Mild coronary artery calcification and mild cardiomegaly. Electronically signed by: Dorethia Molt MD 02/10/2024 01:46 AM EST RP Workstation: [id number]   CT HEAD WO CONTRAST ( ) Result Date: 02/10/2024 EXAM: CT HEAD WITHOUT CONTRAST 02/10/2024 01:28:16 AM TECHNIQUE: CT of the head was  performed without the administration of intravenous contrast. Automated exposure control, iterative reconstruction, and/or weight based adjustment of the mA/kV was utilized to reduce the radiation dose to as low as reasonably achievable. COMPARISON: None available. CLINICAL HISTORY: Mental status change, unknown cause. FINDINGS: BRAIN AND VENTRICLES: Cerebral ventricle sizes are concordant with the degree of cerebral volume loss.No acute hemorrhage. No evidence of acute infarct. No hydrocephalus. No extra-axial collection. No mass effect or midline shift. Residual contrast material within intracranial vasculature from recent contrast administration. ORBITS: Bilateral lens replacement. SINUSES: No acute abnormality. SOFT TISSUES AND SKULL: Partially imaged endotracheal tube in place. No acute soft tissue abnormality. No skull fracture. IMPRESSION: 1. No acute intracranial abnormality. Electronically signed by: Morgane Naveau MD 02/10/2024 01:40 AM EST RP Workstation: [id number]   DG Chest Portable 1 View Result Date: 02/10/2024 EXAM: 1 VIEW(S) XRAY OF THE CHEST 02/10/2024 12:12:00 AM COMPARISON: 02/09/2024 CLINICAL HISTORY: post intubation FINDINGS: LINES, TUBES AND  DEVICES: Endotracheal tube in place with tip 2.2 cm above the carina. Enteric tube in place, courses below the diaphragm. LUNGS AND PLEURA: Interval development of extensive airspace infiltrate within the right lung, predominantly within the right upper lobe. This may represent acute pneumonic consolidation, pulmonary hemorrhage or asymmetric alveolar pulmonary edema as could be seen with acute mitral valve regurgitation. Known right pleural effusion seen on CT examination of 02/09/2024 is not well appreciated on this supine radiograph. No definite pneumothorax. HEART AND MEDIASTINUM: Similar mild cardiomegaly. Left chest cardiac pacemaker noted. Aortic arch atherosclerosis. BONES AND SOFT TISSUES: No acute osseous abnormality. IMPRESSION: 1. Interval development of extensive airspace infiltrate within the right lung, predominantly within the right upper lobe, possibly representing acute pneumonic consolidation, pulmonary hemorrhage, or asymmetric alveolar pulmonary edema as could be seen with acute mitral valve regurgitation. 2. Known right pleural effusion seen on CT examination of 02/09/2024 is not well appreciated on this supine radiograph. No definite pneumothorax. Electronically signed by: Dorethia Molt MD 02/10/2024 12:22 AM EST RP Workstation: [id number]   CT Angio Chest PE W and/or Wo Contrast Result Date: 02/09/2024 EXAM: CTA CHEST 02/09/2024 06:29:20 PM TECHNIQUE: CTA of the chest was performed without and with the administration of 75 mL of iohexol  (OMNIPAQUE ) 350 MG/ML injection. Multiplanar reformatted images are provided for review. MIP images are provided for review. Automated exposure control, iterative reconstruction, and/or weight based adjustment of the mA/kV was utilized to reduce the radiation dose to as low as reasonably achievable. COMPARISON: None available. CLINICAL HISTORY: Pulmonary embolism (PE) suspected, low to intermediate prob, positive D-dimer. FINDINGS: PULMONARY ARTERIES:  Pulmonary arteries are adequately opacified for evaluation. No acute pulmonary embolus. Main pulmonary artery is normal in caliber. MEDIASTINUM: The heart and pericardium demonstrate no acute abnormality. There is no acute abnormality of the thoracic aorta. LYMPH NODES: Right hilar lymphadenopathy with, as an example, a 1.2 cm lymph node (5:54). Left hilar lymphadenopathy with, as an example, a 1.2 cm lymph node (5 x 15 x 4 mm). Mediastinal lymphadenopathy with a right paratracheal 1.1 cm lymph node (5.32). No axillary lymphadenopathy. LUNGS AND PLEURA: At least a small volume right pleural effusion. No left pleural effusion. No pneumothorax. Basal atelectasis of the right lower lobe. Diffuse bronchial wall thickening with mucous plugging within the right lower lobe. Mucous plugging within the left lower lobe to a lesser extent. Question of developing small consolidation within the right lower lobe (6:88) with associated bronchial plugging. The trachea and mainstem bronchi are patent. Expiratory phase respiration. UPPER ABDOMEN: Limited images of the upper abdomen  are unremarkable. SOFT TISSUES AND BONES: Left chest wall 2 lead cardiac device. No acute bone or soft tissue abnormality. IMPRESSION: 1. No pulmonary embolism. 2. Diffuse bronchial wall thickening with mucous plugging, greater in the right lower lobe, with probable developing small right lower lobe consolidation. 3. Small right pleural effusion. 4. Right hilar, left hilar, and mediastinal lymphadenopathy. Likely reactive in etiology. Recommend attention on follow-up. Electronically signed by: Morgane Naveau MD 02/09/2024 07:12 PM EST RP Workstation: [id number]   DG Chest 2 View Result Date: 02/09/2024 CLINICAL DATA:  Shortness of breath and pleural effusion. EXAM: CHEST - 2 VIEW COMPARISON:  Chest x-ray 01/29/2024.  Chest CT 01/24/2024. FINDINGS: There is a small right pleural effusion. Left lung is clear. No pneumothorax or focal lung infiltrate. The  cardiac silhouette is enlarged. Left-sided pacemaker is again seen. No acute fractures are identified. IMPRESSION: 1. Small right pleural effusion. 2. Cardiomegaly. Electronically Signed   By: Greig Pique M.D.   On: 02/09/2024 15:56   US  THORACENTESIS ASP PLEURAL SPACE W/IMG GUIDE Result Date: 01/29/2024 INDICATION: 73 year old female presents with shortness of breath, cough, and recurrent right pleural effusion. Received request for diagnostic and therapeutic thoracentesis. EXAM: ULTRASOUND GUIDED RIGHT THORACENTESIS MEDICATIONS: 10 mL 1% lidocaine  COMPLICATIONS: None immediate. PROCEDURE: An ultrasound guided thoracentesis was thoroughly discussed with the patient and questions answered. The benefits, risks, alternatives and complications were also discussed. The patient understands and wishes to proceed with the procedure. Written consent was obtained. Ultrasound was performed to localize and mark an adequate pocket of fluid in the right chest. The area was then prepped and draped in the normal sterile fashion. 1% lidocaine  was used for local anesthesia. Under ultrasound guidance a 6 Fr Safe-T-Centesis catheter was introduced. Thoracentesis was performed. The catheter was removed and a dressing applied. FINDINGS: A total of approximately 700 mL of amber fluid was removed. Samples were sent to the laboratory as requested by the clinical team. IMPRESSION: Successful ultrasound guided right thoracentesis yielding 700 mL of pleural fluid. Performed by: Kristi Davenport, NP Electronically Signed   By: Cordella Banner   On: 01/29/2024 15:39   DG Chest Port 1 View Result Date: 01/29/2024 EXAM: 1 VIEW(S) XRAY OF THE CHEST 01/29/2024 11:40:00 AM COMPARISON: 01/29/2024 CLINICAL HISTORY: Recurrent right pleural effusion FINDINGS: LINES, TUBES AND DEVICES: Left chest pacemaker with leads terminating in the right atrium and coronary sinus. LUNGS AND PLEURA: No focal pulmonary opacity. No pulmonary edema. Near  complete resolution of the right pleural effusion with minimal blunting of the right costophrenic angle. Improved aeration of right lung base. No pneumothorax. HEART AND MEDIASTINUM: Cardiomegaly. BONES AND SOFT TISSUES: No acute osseous abnormality. Cholecystectomy clips. IMPRESSION: 1. Near complete resolution of the right pleural effusion with minimal residual blunting of the right costophrenic angle and improved aeration of the right lung base. Electronically signed by: Rogelia Myers MD 01/29/2024 12:53 PM EST RP Workstation: [id number]   DG Chest 2 View Result Date: 01/29/2024 EXAM: 2 VIEW(S) XRAY OF THE CHEST 01/29/2024 06:46:00 AM COMPARISON: 01/26/2024 CLINICAL HISTORY: Pleural effusion on right. FINDINGS: LINES, TUBES AND DEVICES: Stable 2 lead left chest pacemaker. LUNGS AND PLEURA: Increased right base airspace disease. Small right pleural effusion, mildly increased. No pulmonary edema. No pneumothorax. HEART AND MEDIASTINUM: No acute abnormality of the cardiac and mediastinal silhouettes. BONES AND SOFT TISSUES: Upper abdominal surgical clips noted. No acute osseous abnormality. IMPRESSION: 1. Redeveloping small right pleural effusion with increased right base airspace disease, most likely atelectasis. 2. No pneumothorax.  Electronically signed by: Rockey Kilts MD 01/29/2024 11:11 AM EST RP Workstation: [id number]   US  THORACENTESIS ASP PLEURAL SPACE W/IMG GUIDE Result Date: 01/26/2024 INDICATION: 73 year old female presents with shortness of breath. Received request for diagnostic and therapeutic thoracentesis. EXAM: ULTRASOUND GUIDED RIGHT THORACENTESIS MEDICATIONS: 8 mL 1% lidocaine  COMPLICATIONS: None immediate. PROCEDURE: An ultrasound guided thoracentesis was thoroughly discussed with the patient and questions answered. The benefits, risks, alternatives and complications were also discussed. The patient understands and wishes to proceed with the procedure. Written consent was obtained.  Ultrasound was performed to localize and mark an adequate pocket of fluid in the right chest. The area was then prepped and draped in the normal sterile fashion. 1% lidocaine  was used for local anesthesia. Under ultrasound guidance a 6 Fr Safe-T-Centesis catheter was introduced. Thoracentesis was performed. The catheter was removed and a dressing applied. FINDINGS: A total of approximately 900 mL of clear yellow fluid was removed. Samples were sent to the laboratory as requested by the clinical team. IMPRESSION: Successful ultrasound guided right thoracentesis yielding 900 mL of pleural fluid. Performed by: Rayfield Buff, NP Electronically Signed   By: Cordella Banner   On: 01/26/2024 14:49   DG Chest Port 1 View Result Date: 01/26/2024 EXAM: 1 VIEW(S) XRAY OF THE CHEST 01/26/2024 01:02:00 PM COMPARISON: 01/25/2024 CLINICAL HISTORY: SOB (shortness of breath); Status post thoracentesis FINDINGS: LUNGS AND PLEURA: Evacuation of right pleural effusion with a small amount of residual fluid. Minimal overlying atelectasis. The left lung remains clear No post-procedural pneumothorax. No pulmonary edema. No pneumothorax. HEART AND MEDIASTINUM: Cardiomegaly with left subclavian approach cardiac rhythm maintenance device. Aortic arch atherosclerosis. BONES AND SOFT TISSUES: No acute osseous abnormality. IMPRESSION: 1. Evacuation of right pleural effusion with a small residual effusion and minimal overlying atelectasis. No pneumothorax. 2. No post-procedure or pneumothorax. Electronically signed by: Maude Stammer MD 01/26/2024 01:38 PM EST RP Workstation: [id number]   DG Chest Port 1 View Result Date: 01/25/2024 EXAM: 1 VIEW(S) XRAY OF THE CHEST 01/25/2024 09:41:07 AM COMPARISON: 01/24/2024 CLINICAL HISTORY: Shortness of breath FINDINGS: LUNGS AND PLEURA: Small right pleural effusion. Increased right lower and developing inferior right upper lobe airspace disease. Diffuse interstitial thickening. No pneumothorax.  HEART AND MEDIASTINUM: Left subclavian dual lead pacemaker unchanged. Cardiomegaly. BONES AND SOFT TISSUES: No acute osseous abnormality. IMPRESSION: 1. Persistent small right pleural effusion with increased right lower and developing inferior right upper airspace disease, favoring pneumonia 2. Cardiomegaly with increased interstitial thickening, favoring mild pulmonary edema Electronically signed by: Rockey Kilts MD 01/25/2024 05:05 PM EST RP Workstation: [id number]   US  Venous Img Lower Bilateral (DVT) Result Date: 01/24/2024 CLINICAL DATA:  Pulmonary embolism. EXAM: BILATERAL LOWER EXTREMITY VENOUS DOPPLER ULTRASOUND TECHNIQUE: Gray-scale sonography with compression, as well as color and duplex ultrasound, were performed to evaluate the deep venous system(s) from the level of the common femoral vein through the popliteal and proximal calf veins. COMPARISON:  None Available. FINDINGS: VENOUS Normal compressibility of the common femoral, superficial femoral, and popliteal veins, as well as the visualized calf veins. Visualized portions of profunda femoral vein and great saphenous vein unremarkable. No filling defects to suggest DVT on grayscale or color Doppler imaging. Doppler waveforms show normal direction of venous flow, normal respiratory plasticity and response to augmentation. Limited views of the contralateral common femoral vein are unremarkable. OTHER None. Limitations: none IMPRESSION: Negative. Electronically Signed   By: Leita Birmingham M.D.   On: 01/24/2024 14:19   CT Angio Chest PE W and/or Wo Contrast Addendum  Date: 01/24/2024 ** ADDENDUM #1 ** ADDENDUM: The above findings were discussed with Dr. Levander at 9:53 am 01/24/24. ---------------------------------------------------- Electronically signed by: Evalene Coho MD 01/24/2024 10:49 AM EST RP Workstation: [id number]   Result Date: 01/24/2024 ** ORIGINAL REPORT ** EXAM: CTA of the Chest with contrast for PE 01/24/2024 09:25:18 AM TECHNIQUE: CTA  of the chest was performed without and with the administration of 75 mL of iohexol  (OMNIPAQUE ) 350 MG/ML injection. Multiplanar reformatted images are provided for review. MIP images are provided for review. Automated exposure control, iterative reconstruction, and/or weight based adjustment of the mA/kV was utilized to reduce the radiation dose to as low as reasonably achievable. COMPARISON: PA and lateral radiographs of the chest dated 01/24/2024. CLINICAL HISTORY: Pulmonary embolism (PE) suspected, low to intermediate prob, positive D-dimer. FINDINGS: PULMONARY ARTERIES: Pulmonary arteries are adequately opacified for evaluation. There is nonocclusive thromboembolic disease present within the distal right pulmonary artery and within the right lower lobe artery and anterior segmental branch of the right lower lobe pulmonary artery. There is also thromboembolus within the posterior segmental artery. There is no definite thromboembolic disease demonstrated in the left lung. Main pulmonary artery is normal in caliber. MEDIASTINUM: The heart is enlarged. There is no evidence of right ventricular strain. There is mild calcific coronary artery disease. The thoracic aorta demonstrates mild-to-moderate calcific atheromatous disease. The ascending thoracic aorta measures approximately 3.3 cm in diameter. LYMPH NODES: There are numerous enlarged mediastinal lymph nodes. There is also right hilar lymphadenopathy. No axillary lymphadenopathy. LUNGS AND PLEURA: There is mild dependent atelectasis within the right lung. There is a moderate right-sided pleural effusion. No pneumothorax. No focal consolidation or pulmonary edema. UPPER ABDOMEN: Limited images of the upper abdomen are unremarkable. SOFT TISSUES AND BONES: No acute bone or soft tissue abnormality. IMPRESSION: 1. Nonocclusive pulmonary emboli involving the distal right pulmonary artery, right lower lobe artery, and anterior and posterior segmental branches; no  left-sided PE identified. No CT evidence of right heart strain. 2. Moderate right pleural effusion. 3. Cardiomegaly with mild coronary artery calcifications and mild-to-moderate thoracic aortic atherosclerosis. 4. Enlarged mediastinal and right hilar lymph nodes; recommend clinical correlation and follow-up per oncologic/infectious/inflammatory considerations. Electronically signed by: Evalene Coho MD 01/24/2024 09:51 AM EST RP Workstation: [id number]   DG Chest 2 View Result Date: 01/24/2024 CLINICAL DATA:  Shortness of breath. EXAM: CHEST - 2 VIEW COMPARISON:  08/08/2020 FINDINGS: Mild cardiomegaly, with dual lead pacemaker in place. Small to moderate right pleural effusion. Right basilar atelectasis versus infiltrate. IMPRESSION: Small to moderate right pleural effusion, with right basilar atelectasis versus infiltrate. Electronically Signed   By: Norleen DELENA Kil M.D.   On: 01/24/2024 08:29    ECHO pending  TELEMETRY (personally reviewed): VP rate 70s  EKG (personally reviewed): VP rate 69 bpm  Data reviewed by me 02/11/2024: last 24h vitals tele labs imaging I/O ED provider note, admission H&P  Principal Problem:   CAP (community acquired pneumonia) Active Problems:   Cardiac arrest [facility])    ASSESSMENT AND PLAN:  Deborah Dawson is a 73 y.o. female  with a past medical history of chronic HFrEF, s/p CRT-P 07/2021, persistent atrial fibrillation, hypertension, type 2 diabetes who presented to the ED on 02/09/2024 for shortness of breath. CTA chest concerning for PNA. Overnight had PEA arrest x2 with ROSC. Cardiology was consulted for further evaluation.   # PEA arrest # Pneumonia # Acute on chronic HFrEF # S/p CRT-P 07/2021 # Persistent atrial fibrillation Patient initially presented with shortness of breath,  concern for pneumonia on CTA chest.  Overnight last night she had PEA arrest with ROSC then shortly afterwards arrested again.  Intubated in the ED.  Troponins mildly elevated and flat  trending.  BNP significantly elevated at 15,900. EKG without acute ischemic changes.  Echo this admission with EF 30-35%, global hypokinesis, moderately reduced RV function with mild enlargement and mild RV thicknes. -Continue IV Lasix  60 mg twice daily. -GDMT meds currently held.  Will consider reintroducing tomorrow. -Mildly elevated and flat troponins most consistent with demand/supply mismatch and not ACS.  -Further management per PCCM.   This patient's plan of care was discussed and created with Dr. Florencio and he is in agreement.  Signed: Danita Bloch, PA-C  02/11/2024, 8:40 AM [facility]

## 2024-02-11 NOTE — Progress Notes (Signed)
 Nutrition Follow Up Note   DOCUMENTATION CODES:   Not applicable  INTERVENTION:   Ensure Plus High Protein po BID, each supplement provides 350 kcal and 20 grams of protein  MVI po daily   Vitamin C 500mg  po BID   Pt at high refeed risk; recommend monitor potassium, magnesium  and phosphorus labs daily until stable  Daily weights   NUTRITION DIAGNOSIS:   Inadequate oral intake related to inability to eat (pt sedated and ventilated) as evidenced by NPO status. -resolving   GOAL:   Patient will meet greater than or equal to 90% of their needs -not met   MONITOR:   PO intake, Supplement acceptance, Labs, Weight trends, I & O's, Skin  ASSESSMENT:   73 y/o female with h/o HTN, AFib, nonischemic cardiomyopathy, PPM-ICD, CHF, PE, CHF, PAF, DM, hiatal hernia, hypertensive gastropathy, NAFLD, gout and CKD III who is admitted with CAP, pleural effusions with mediastinal lymphadenopathy, CHF exacerbation and AKI complicated by PEA arrest.  -Pt s/p thoracentesis 11/4 ( ), 11/7 ( ) & 11/20 ( )  Pt extubated today and initiated on a diet. RD will add supplements and vitamins to help pt meet her estimated needs. Pt is at high refeed risk. No BM since admission. Per chart, pt appears weight stable since admission.    Medications reviewed and include: vitamin C, pepcid, insulin , lasix , miralax , unasyn  Labs reviewed: K 4.3 wnl, BUN 40(H), creat 1.92(H), P 4.7(H), Mg 2.1 wnl Wbc- 11.1(H) Cbgs- 119, 143, 159, 147 x 24 hrs   UOP-   Diet Order:   Diet Order             Diet heart healthy/carb modified Room service appropriate? Yes; Fluid consistency: Thin  Diet effective now                  EDUCATION NEEDS:   No education needs have been identified at this time  Skin:  Skin Assessment: Reviewed RN Assessment (Stage I sacrum)  Last BM:  pta  Height:   Ht Readings from Last 1 Encounters:  02/10/24 5' 1 (1.549 m)    Weight:   Wt Readings from  Last 1 Encounters:  02/11/24 71.2 kg    Ideal Body Weight:  47.7 kg  BMI:  Body mass index is 29.66 kg/m.  Estimated Nutritional Needs:   Kcal:  1600-1800kcal/day  Protein:  80-90g/day  Fluid:  1.2-1.4L/day  Augustin Shams MS, RD, LDN If unable to be reached, please send secure chat to RD inpatient available from 8:00a-4:00p daily

## 2024-02-11 NOTE — Progress Notes (Signed)
 Heart Failure Navigator Progress Note  Assessed for Heart & Vascular TOC clinic readiness.  Patient does not meet criteria due to current [facility] Cardiology patient.   Navigator will sign off at this time.  Roxy Horseman, RN, BSN [facility] Heart Failure Navigator Secure Chat Only

## 2024-02-11 NOTE — Progress Notes (Signed)
 NAME:  Deborah Dawson, MRN:  [id number], DOB:  10-27-1950, LOS: 2 ADMISSION DATE:  02/09/2024  History of Present Illness:  73 yo F presenting to [facility] ED on 02/09/2024 for evaluation of shortness of breath.   History obtained per chart review and husband telephone report. Patient has a recent hospital admission from 01/24/2024 to 01/30/2024 for treatment of acute on chronic HFrEF with right-sided pleural effusion as well as acute small pulmonary embolism.  She underwent a thoracentesis x 2 and upon discharge her Eliquis  was switched to Xarelto .  Upon discharge home she was in her normal state of health until around 02/04/2024 when she began developing progressive shortness of breath.  She also reported a chronic cough with clear to yellow sputum production on 02/10/2024. She followed up outpatient with cardiology and PCP in November 2025 after discharge. Outpatient CXR on 02/08/24 showed improved aeration but continued small pleural effusion.   She denied other symptoms, has been taking medication as prescribed, no known sick contacts.   ED course: Upon arrival patient alert and responsive complaining of shortness of breath.  Initial vitals show hypertension but otherwise stable on room air.  Patient eventually placed on 2 L nasal cannula for comfort.  Due to recent admission with small acute PE, CT angio ordered revealing no pulmonary embolism but possible developing right lower lobe consolidation.  Labs significant for hyperkalemia, improved NAGMA & AKI. Infectious workup initiated and TRH consulted for admission. Medications given: IV contrast, ceftriaxone  and azithromycin  Initial Vitals: 97.7, 24, 71, 162/106 & 95% on RA   Significant labs:  I, Jenita Ruth Rust-Chester, AGACNP-BC, personally viewed and interpreted this ECG. EKG Interpretation: Date: 02/10/24, EKG Time: 00:16, Rate: 117, Rhythm: irregular V-tach (chronic LBBB), QRS axis:  Intervals: chronic LBBB, ST/T Wave abnormalities: mild  STE in V1-V3- does not meet STEMI criteria, Narrative Interpretation: irregular Ventricular tachycardia  Chemistry: Na+:132, K+: 5.9, BUN/Cr.: 35/1.24, Serum CO2/ AG: 21/9 Hematology: WBC: 5.7, Hgb: 14.0,  Troponin: 49, BNP: pending, Lactic/ PCT: pending, COVID-19 & Influenza A/B:  ABG: pending   CXR 02/10/24:  Interval development of extensive airspace infiltrate within the right lung, predominantly within the right upper lobe, possibly representing acute pneumonic consolidation, pulmonary hemorrhage, or asymmetric alveolar pulmonary edema as could be seen with acute mitral valve regurgitation. 2. Known right pleural effusion seen on CT examination of 02/09/2024 is not well appreciated on this supine radiograph. No definite pneumothorax. CT angio chest 02/09/24: No pulmonary embolism 2. Diffuse bronchial wall thickening with mucous plugging, greater in the right lower lobe, with probable  developing small right lower lobe consolidation. 3. Small right pleural effusion. 4. Right hilar, left hilar, and mediastinal lymphadenopathy. Likely reactive in etiology. Recommend attention on follow-up. CTH 02/10/24: No acute intracranial abnormality CT chest wo contrast 02/10/24:  Extensive asymmetric pulmonary ground-glass infiltrate, multifocal consolidation, and smooth interlobular septal thickening, greatest in the right upper lobe, most suggestive of asymmetric pulmonary edema given rapid interval development; acute pulmonary hemorrhage is a differential consideration. Extensive pneumonic infiltrate or ARDS considered less likely. 2. Moderate to large right pleural effusion with compressive atelectasis of the right lower lobe. 3. Acute minimally displaced fracture of the left anterolateral seventh rib. 4. Thoracic adenopathy as outlined above. This is nonspecific, but may be reactive, inflammatory as can be seen with conditions such as sarcoidosis, or lymphoproliferative in etiology. Comparison with  interval examinations, if available, be helpful in determining stability. If none are available, follow-up CT or PET CT examination would be helpful  in 3 months to demonstrate stability or resolution. 5. Findings consistent with pulmonary arterial hypertension (enlarged central pulmonary arteries). 6. Mild coronary artery calcification and mild cardiomegaly.   While awaiting room placement in ED, patient reported feeling nauseous and was given some Zofran .  While the nurse was bedside she reports patient stating she did not feel well, with circumoral cyanosis and becoming suddenly unresponsive.  She also reported her jaw appeared tight like she bit her tongue briefly.  No noticeable change to rhythm.  When pulse checked patient found to be in PEA.  She underwent 1 round of CPR and ACLS prior to ROSC, she remained obtunded and was emergently intubated for airway protection. Then about 20 minutes later became pulseless in PEA again receiving 1 round of CPR and ACLS prior to her return of ROSC.    PCCM consulted for assistance in management due to PEA cardiac arrest suspect secondary to hyperkalemia in the setting of suspected CAP.  Pertinent  Medical History  HFrEF NYHA class II with PPM in place LBBB PAF on Xarelto  HTN PE (01/24/24) T2DM CKD stage 3a? HTN  Significant Hospital Events: Including procedures, antibiotic start and stop dates in addition to other pertinent events   02/09/24: Admit to inpatient for treatment of suspected CAP.  Overnight patient with 2 brief PEA arrests requiring emergent intubation and mechanical ventilatory support.  Admit to ICU.  Interim History / Subjective:  -- Patient passed SBT this AM. Proceeded with extubation,  -- Appears well post extubation. Respiratory status stable.   Objective    Blood pressure 139/84, pulse 88, temperature (!) 101.3 F (38.5 C), resp. rate 19, height 5' 1 (1.549 m), weight 71.2 kg, SpO2 100%.    Vent Mode: PRVC FiO2 (%):   [30 %-40 %] 35 % Set Rate:  [20 bmp] 20 bmp Vt Set:  [390 mL-450 mL] 390 mL PEEP:  [5 cmH20] 5 cmH20 Plateau Pressure:  [15 cmH20] 15 cmH20   Intake/Output Summary (Last 24 hours) at 02/11/2024 0953 Last data filed at 02/11/2024 0800 Gross per 24 hour  Intake 1351.01 ml  Output 1322 ml  Net 29.01 ml   Filed Weights   02/09/24 1515 02/10/24 0438 02/11/24 0500  Weight: 66.7 kg 70.5 kg 71.2 kg    Examination: General: Appears well post extubation. NAD.  HENT: Supple neck, reactive pupils Lungs: diminished air entry at the right hemithorax.  Cardiovascular: Paced.  Abdomen: Soft, non tender, non distended  Extremities: Warm and well perfused  Labs and imaging were reviewed.   Assessment and Plan  73 year old female patient with nonischemic cardiomyopathy with EF 20 to 25%, persistent A-fib status post pacemaker placement presenting to [facility] ICU for shortness of breath.  Course complicated by PEA arrest requiring intubation and mechanical ventilation.   # PEA arrest secondary to.. # Acute hypoxic respiratory failure versus QTc prolongation - Extubated today 11/20.  # Moderate to large pleural effusion with mediastinal lymphadenopathy raising concern for an underlying lymphoproliferative disease # Heart failure with reduced EF 20 to 25% that is chronic currently in exacerbation.   Neuro: oxycodone  p.o. scheduled for pain management, lidocaine  patch to be applied on chest.  Being mindful of her QTc.  Daily EKGs. CVS: IV Lasix  60 mg daily.  Echocardiogram pending.  Appreciate cardiology's input.  Holding GDMT for now Pulmonary: Proceed with thoracentesis for diagnostic and therapeutic purposes. [place] for SpO2 > 90%.  GI Swallow eval. Advance diet as tolerated.   Heme hold Xarelto  for now until post  thoracentesis,  Endo-POC 140-180   Critical care time: 50 minutes    Darrin Barn, MD [facility] 02/11/2024 10:04 AM

## 2024-02-11 NOTE — Plan of Care (Signed)
  Problem: Coping: Goal: Ability to adjust to condition or change in health will improve Outcome: Progressing   Problem: Health Behavior/Discharge Planning: Goal: Ability to manage health-related needs will improve Outcome: Progressing   Problem: Metabolic: Goal: Ability to maintain appropriate glucose levels will improve Outcome: Progressing   Problem: Nutritional: Goal: Maintenance of adequate nutrition will improve Outcome: Progressing   Problem: Skin Integrity: Goal: Risk for impaired skin integrity will decrease Outcome: Progressing   Problem: Respiratory: Goal: Ability to maintain a clear airway and adequate ventilation will improve Outcome: Progressing   Problem: Education: Goal: Knowledge of General Education information will improve Description: Including pain rating scale, medication(s)/side effects and non-pharmacologic comfort measures Outcome: Progressing

## 2024-02-12 ENCOUNTER — Inpatient Hospital Stay

## 2024-02-12 DIAGNOSIS — R579 Shock, unspecified: Secondary | ICD-10-CM

## 2024-02-12 LAB — PROCALCITONIN: Procalcitonin: 2.54 ng/mL

## 2024-02-12 LAB — BLOOD GAS, ARTERIAL
Acid-base deficit: 11.1 mmol/L — ABNORMAL HIGH (ref 0.0–2.0)
Bicarbonate: 13.8 mmol/L — ABNORMAL LOW (ref 20.0–28.0)
FIO2: 100 %
O2 Saturation: 100 %
Patient temperature: 37
pCO2 arterial: 28 mmHg — ABNORMAL LOW (ref 32–48)
pH, Arterial: 7.3 — ABNORMAL LOW (ref 7.35–7.45)
pO2, Arterial: 252 mmHg — ABNORMAL HIGH (ref 83–108)

## 2024-02-12 LAB — BASIC METABOLIC PANEL WITH GFR
Anion gap: 16 — ABNORMAL HIGH (ref 5–15)
BUN: 68 mg/dL — ABNORMAL HIGH (ref 8–23)
CO2: 20 mmol/L — ABNORMAL LOW (ref 22–32)
Calcium: 8.6 mg/dL — ABNORMAL LOW (ref 8.9–10.3)
Chloride: 94 mmol/L — ABNORMAL LOW (ref 98–111)
Creatinine, Ser: 2.95 mg/dL — ABNORMAL HIGH (ref 0.44–1.00)
GFR, Estimated: 16 mL/min — ABNORMAL LOW (ref >60)
Glucose, Bld: 74 mg/dL (ref 70–99)
Potassium: 4.7 mmol/L (ref 3.5–5.1)
Sodium: 130 mmol/L — ABNORMAL LOW (ref 135–145)

## 2024-02-12 LAB — CBC
HCT: 41.1 % (ref 36.0–46.0)
Hemoglobin: 13.6 g/dL (ref 12.0–15.0)
MCH: 28.6 pg (ref 26.0–34.0)
MCHC: 33.1 g/dL (ref 30.0–36.0)
MCV: 86.5 fL (ref 80.0–100.0)
Platelets: 155 K/uL (ref 150–400)
RBC: 4.75 MIL/uL (ref 3.87–5.11)
RDW: 16.5 % — ABNORMAL HIGH (ref 11.5–15.5)
WBC: 10 K/uL (ref 4.0–10.5)
nRBC: 0 % (ref 0.0–0.2)

## 2024-02-12 LAB — GLUCOSE, CAPILLARY
Glucose-Capillary: 74 mg/dL (ref 70–99)
Glucose-Capillary: 86 mg/dL (ref 70–99)
Glucose-Capillary: 88 mg/dL (ref 70–99)
Glucose-Capillary: 129 mg/dL — ABNORMAL HIGH (ref 70–99)
Glucose-Capillary: 123 mg/dL — ABNORMAL HIGH (ref 70–99)
Glucose-Capillary: 135 mg/dL — ABNORMAL HIGH (ref 70–99)

## 2024-02-12 LAB — CULTURE, RESPIRATORY W GRAM STAIN: Culture: NO GROWTH

## 2024-02-12 LAB — ALBUMIN, FLUID (OTHER): Albumin, Body Fluid Other: 2 g/dL

## 2024-02-12 LAB — MAGNESIUM: Magnesium: 2.3 mg/dL (ref 1.7–2.4)

## 2024-02-12 LAB — PHOSPHORUS: Phosphorus: 4.8 mg/dL — ABNORMAL HIGH (ref 2.5–4.6)

## 2024-02-12 LAB — LEGIONELLA PNEUMOPHILA SEROGP 1 UR AG: L. pneumophila Serogp 1 Ur Ag: NEGATIVE

## 2024-02-12 MED ORDER — DOXYLAMINE SUCCINATE (SLEEP) 25 MG PO TABS
50.0000 mg | ORAL_TABLET | Freq: Every day | ORAL | Status: DC
  Administered 2024-02-12: 50 mg via ORAL
  Filled 2024-02-12 (×2): qty 2

## 2024-02-12 MED ORDER — RIVAROXABAN 15 MG PO TABS
15.0000 mg | ORAL_TABLET | Freq: Every day | ORAL | Status: DC
  Administered 2024-02-12: 15 mg via ORAL
  Filled 2024-02-12 (×3): qty 1

## 2024-02-12 MED ORDER — IPRATROPIUM-ALBUTEROL 0.5-2.5 (3) MG/3ML IN SOLN
3.0000 mL | Freq: Two times a day (BID) | RESPIRATORY_TRACT | Status: DC
  Administered 2024-02-12 – 2024-02-14 (×4): 3 mL via RESPIRATORY_TRACT
  Filled 2024-02-12 (×4): qty 3

## 2024-02-12 MED ORDER — BENZONATATE 100 MG PO CAPS
100.0000 mg | ORAL_CAPSULE | Freq: Three times a day (TID) | ORAL | Status: DC | PRN
  Administered 2024-02-12 – 2024-02-22 (×14): 100 mg via ORAL
  Filled 2024-02-12 (×14): qty 1

## 2024-02-12 MED ORDER — FUROSEMIDE 10 MG/ML IJ SOLN
20.0000 mg | Freq: Once | INTRAMUSCULAR | Status: AC
  Administered 2024-02-12: 20 mg via INTRAVENOUS
  Filled 2024-02-12: qty 2

## 2024-02-12 MED ORDER — OXYCODONE HCL 5 MG PO TABS: 5.0000 mg | ORAL_TABLET | Freq: Four times a day (QID) | ORAL | Status: DC | PRN

## 2024-02-12 MED ORDER — SODIUM CHLORIDE 0.9 % IV SOLN: INTRAVENOUS | Status: DC

## 2024-02-12 MED ORDER — INSULIN GLARGINE-YFGN 100 UNIT/ML ~~LOC~~ SOLN
8.0000 [IU] | Freq: Every day | SUBCUTANEOUS | Status: DC
  Administered 2024-02-13: 8 [IU] via SUBCUTANEOUS
  Filled 2024-02-12 (×2): qty 0.08

## 2024-02-12 MED ORDER — PROMETHAZINE HCL 25 MG PO TABS
25.0000 mg | ORAL_TABLET | Freq: Four times a day (QID) | ORAL | Status: DC | PRN
  Administered 2024-02-12 – 2024-02-22 (×2): 25 mg via ORAL
  Filled 2024-02-12 (×4): qty 1

## 2024-02-12 MED ORDER — HYDROCODONE BIT-HOMATROP MBR 5-1.5 MG/5ML PO SOLN
5.0000 mL | Freq: Four times a day (QID) | ORAL | Status: DC | PRN
  Administered 2024-02-12 – 2024-02-24 (×23): 5 mL via ORAL
  Filled 2024-02-12 (×23): qty 5

## 2024-02-12 MED ORDER — PROCHLORPERAZINE EDISYLATE 10 MG/2ML IJ SOLN
5.0000 mg | INTRAMUSCULAR | Status: DC | PRN
  Administered 2024-02-17 – 2024-02-22 (×3): 5 mg via INTRAVENOUS
  Filled 2024-02-12 (×4): qty 2
  Filled 2024-02-12: qty 1

## 2024-02-12 MED ORDER — ONDANSETRON HCL 4 MG/2ML IJ SOLN: 4.0000 mg | Freq: Four times a day (QID) | INTRAMUSCULAR | Status: DC | PRN

## 2024-02-12 MED ORDER — ONDANSETRON 4 MG PO TBDP: 4.0000 mg | ORAL_TABLET | Freq: Three times a day (TID) | ORAL | Status: DC | PRN

## 2024-02-12 MED ORDER — ACETAMINOPHEN 500 MG PO TABS
1000.0000 mg | ORAL_TABLET | Freq: Three times a day (TID) | ORAL | Status: DC | PRN
  Administered 2024-02-12 – 2024-02-24 (×11): 1000 mg via ORAL
  Filled 2024-02-12 (×9): qty 2

## 2024-02-12 MED ORDER — SODIUM CHLORIDE 3 % IN NEBU
4.0000 mL | INHALATION_SOLUTION | Freq: Two times a day (BID) | RESPIRATORY_TRACT | Status: AC
Start: 2024-02-12 — End: 2024-02-15
  Administered 2024-02-13 – 2024-02-15 (×4): 4 mL via RESPIRATORY_TRACT
  Filled 2024-02-12 (×6): qty 4

## 2024-02-12 NOTE — Consult Note (Signed)
 NAME:  Deborah Dawson, MRN:  [id number], DOB:  Jan 07, 1951, LOS: 3 ADMISSION DATE:  02/09/2024, CONSULTATION DATE:  02/12/24 REFERRING MD:  Cleatus Hoof REASON FOR CONSULT:  acute respiratory distress   HPI  73 year old woman with HFrEF (NYHA II), PPM, LBBB, PAF on Xarelto , HTN, T2DM, CKD3a, and recent hospitalization (11/2-11/8) for acute on chronic heart failure, right pleural effusion, and small PE (status post two thoracenteses), now presents on 11/18 with progressively worsening shortness of breath since 11/13. She also reports chronic cough with clear-yellow sputum. Outpatient CXR on 11/17 showed improved aeration but persistent small pleural effusion. She denies other symptoms and has been taking medications as prescribed.   ED Course: Initial vital showed she was hypertensive (162/106), tachypneic (RR 24), and maintaining oxygen saturation of 95% on room air. She was placed on 2 L nasal cannula. Labs showed: Na 132, K 5.9, CO? 21, AG 9, BUN/Cr 35/1.24, WBC 5.7, Hgb 14.0, troponin 49; BNP, lactic acid, procalcitonin, COVID-19, and influenza A/B remained pending. CTA chest negative for PE, but did demonstrate diffuse bronchial wall thickening with mucus plugging, probable developing right lower lobe consolidation, and a small right pleural effusion. Chest X-ray showed extensive right-lung airspace disease concerning for asymmetric pulmonary edema versus pneumonia versus pulmonary hemorrhage. ECG Rate 117, irregular rhythm consistent with irregular ventricular tachycardia in the setting of chronic LBBB; mild STE in V1-V3 not meeting STEMI criteria.  The patient received IV ceftriaxone  and azithromycin  for suspected CAP and admitted to [facility] service. While awaiting inpatient bed assignment, the patient reported nausea, followed by sudden circumoral cyanosis and unresponsiveness. She was found pulseless in PEA. One round of CPR and ACLS resulted in ROSC. She remained obtunded and was emergently intubated  for airway protection. Approximately 20 minutes later, she again became pulseless in PEA, and achieved ROSC after one ACLS cycle.  Post-ROSC imaging with non-contrast CT chest revealed extensive asymmetric pulmonary edema (R > L), multifocal consolidation, moderate-large right pleural effusion, and features consistent with pulmonary hypertension. PCCM was consulted for ongoing post-cardiac arrest care and further evaluation.  SEE SIGNIFICANT EVENTS BELOW  Past Medical History  HFrEF NYHA class II with PPM in place LBBB PAF on Xarelto  HTN PE (01/24/24) T2DM CKD stage 3a? HTN  Significant Hospital Events   02/09/2024: Admitted to [facility] service for acute hypoxic respiratory failure secondary to pneumonia and Pleural Effusion 02/10/2024: Experienced two brief PEA arrests; PCCM and cardiology consulted. 02/11/2024: Underwent thoracentesis; passed spontaneous breathing trial; extubated. 02/12/2024: Transferred to [facility] service; overnight rapid response called for unresponsiveness; transferred back to ICU; PCCM re-consulted.  Consults:  Cardiology PCCM  Procedures:  11/18: Endotracheal Intubation 11/2: Thoracentesis  Interim History / Subjective:      Micro Data:    Antimicrobials:   Anti-infectives (From admission, onward)    Start     Dose/Rate Route Frequency Ordered Stop   02/11/24 1200  Ampicillin -Sulbactam (UNASYN ) 3 g in sodium chloride  0.9 % 100 mL IVPB        3 g 200 mL/hr over 30 Minutes Intravenous Every 12 hours 02/11/24 0749     02/10/24 2230  azithromycin  (ZITHROMAX ) 500 mg in sodium chloride  0.9 % 250 mL IVPB  Status:  Discontinued        500 mg 250 mL/hr over 60 Minutes Intravenous Every 24 hours 02/10/24 0146 02/10/24 1016   02/10/24 0245  Ampicillin -Sulbactam (UNASYN ) 3 g in sodium chloride  0.9 % 100 mL IVPB  Status:  Discontinued  3 g 200 mL/hr over 30 Minutes Intravenous Every 6 hours 02/10/24 0158 02/11/24 0749   02/09/24 2115  cefTRIAXone  (ROCEPHIN )  1 g in sodium chloride  0.9 % 100 mL IVPB        1 g 200 mL/hr over 30 Minutes Intravenous  Once 02/09/24 2108 02/09/24 2231   02/09/24 2115  azithromycin  (ZITHROMAX ) 500 mg in sodium chloride  0.9 % 250 mL IVPB        500 mg 250 mL/hr over 60 Minutes Intravenous  Once 02/09/24 2108 02/09/24 2336      OBJECTIVE  Blood pressure (!) 150/78, pulse 84, temperature 97.8 F (36.6 C), temperature source Oral, resp. rate (!) 23, height 5' 1 (1.549 m), weight 73.2 kg, SpO2 100%.        Intake/Output Summary (Last 24 hours) at 02/12/2024 2307 Last data filed at 02/12/2024 1500 Gross per 24 hour  Intake 1041.77 ml  Output 0 ml  Net 1041.77 ml   Filed Weights   02/10/24 0438 02/11/24 0500 02/12/24 0500  Weight: 70.5 kg 71.2 kg 73.2 kg   Physical Examination  GEN: Critically ill patient, WDWN in NAD HEENT: [place]/AT. PERRL, sclerae anicteric. HEART: regular rhythm, normal rate, S1, S2, no M/R/G,  LUNGS: CTAB, mild crackles without wheezes, no increased WOB,  EXTREMITIES: No Edema, cap refill  NEURO: No gross focal deficits. PSYCH:  Mood and Affect: Mood normal.  ABDOMINAL: Soft: BS x 4, NTND SKIN: Intact, warm, no rashes lesion, or ulcer  Labs/imaging that I havepersonally reviewed  (right click and Reselect all SmartList Selections daily)   DG Chest Port 1 View Result Date: 02/12/2024 EXAM: 1 VIEW(S) XRAY OF THE CHEST 02/12/2024 06:20:59 AM COMPARISON: 02/11/2024 CLINICAL HISTORY: Acute respiratory failure with hypoxia (HCC) [id number] FINDINGS: LINES, TUBES AND DEVICES: Left cardiac device stable in place. LUNGS AND PLEURA: No focal pulmonary opacity. Mild pulmonary edema. Mild bilateral interstitial prominence. Trace right pleural effusion. No pneumothorax. HEART AND MEDIASTINUM: Cardiomegaly. Aortic atherosclerosis. BONES AND SOFT TISSUES: No acute osseous abnormality. IMPRESSION: 1. Mild pulmonary edema with mild bilateral interstitial prominence. 2. Trace right pleural effusion. 3.  Cardiomegaly. Electronically signed by: Waddell Calk MD 02/12/2024 08:44 AM EST RP Workstation: [id number]   DG Chest Port 1 View Result Date: 02/11/2024 CLINICAL DATA:  Status post right-sided thoracentesis. EXAM: PORTABLE CHEST 1 VIEW COMPARISON:  02/11/2024. FINDINGS: Low lung volumes. Stable cardiomediastinal contours. Decreased size of a small right pleural effusion with suspected trace residual right pleural effusion. No pneumothorax. Similar mild interstitial edema. Stable left subclavian dual lead pacer. No acute osseous abnormality. IMPRESSION: 1. Decreased size of a small right pleural effusion with suspected trace residual right pleural effusion. No pneumothorax. 2. Similar mild interstitial edema. Electronically Signed   By: Harrietta Sherry M.D.   On: 02/11/2024 13:21   DG Chest Port 1 View Result Date: 02/11/2024 EXAM: 1 VIEW(S) XRAY OF THE CHEST 02/11/2024 08:25:21 AM COMPARISON: 02/10/2024 CLINICAL HISTORY: Pleural effusion. FINDINGS: LINES, TUBES AND DEVICES: External pacing pads noted. Left subclavian dual lead pacemaker in place. LUNGS AND PLEURA: Low lung volumes. Improved right lung airspace opacities. Mild pulmonary edema. Small right pleural effusion. No pneumothorax. HEART AND MEDIASTINUM: Mild cardiomegaly. Aortic arch atherosclerosis. BONES AND SOFT TISSUES: No acute osseous abnormality. IMPRESSION: 1. Small right pleural effusion. 2. Mild pulmonary edema. 3. Improved right lung airspace opacities. Electronically signed by: Waddell Calk MD 02/11/2024 11:49 AM EST RP Workstation: [id number]     Labs   CBC: Recent Labs  Lab 02/09/24 1640 02/10/24 9786  02/11/24 0500 02/12/24 0601  WBC 5.7 16.3* 11.1* 10.0  HGB 14.0 13.7 14.2 13.6  HCT 43.7 42.9 42.3 41.1  MCV 88.6 90.1 85.1 86.5  PLT 220 216 192 155    Basic Metabolic Panel: Recent Labs  Lab 02/09/24 1640 02/10/24 0213 02/11/24 0447 02/11/24 0500 02/11/24 1016 02/12/24 0601  NA 132* 134* 137  --   --  130*   K 5.9* 4.4 4.7  --  4.3 4.7  CL 102 102 100  --   --  94*  CO2 21* 21* 23  --   --  20*  GLUCOSE 162* 270* 152*  --   --  74  BUN 35* 34* 40*  --   --  68*  CREATININE 1.24* 1.44* 1.92*  --   --  2.95*  CALCIUM  8.7* 9.2 8.9  --   --  8.6*  MG  --  2.7*  --  2.1 2.1 2.3  PHOS  --  4.0 4.7*  --   --  4.8*   GFR: Estimated Creatinine Clearance: 15.1 mL/min (A) (by C-G formula based on SCr of 2.95 mg/dL (H)). Recent Labs  Lab 02/09/24 1640 02/09/24 2127 02/10/24 0213 02/10/24 0507 02/10/24 0807 02/11/24 0500 02/12/24 0601  PROCALCITON  --   --  0.11 0.30  --  1.46 2.54  WBC 5.7  --  16.3*  --   --  11.1* 10.0  LATICACIDVEN  --  0.7 3.3* 2.9* 1.8  --   --     Liver Function Tests: Recent Labs  Lab 02/10/24 0213 02/11/24 0447  AST 93*  --   ALT 41  --   ALKPHOS 99  --   BILITOT 0.8  --   PROT 6.7  --   ALBUMIN 3.3* 3.2*   No results for input(s): LIPASE, AMYLASE in the last 168 hours. No results for input(s): AMMONIA in the last 168 hours.  ABG    Component Value Date/Time   PHART 7.3 (L) 02/12/2024 2239   PCO2ART 28 (L) 02/12/2024 2239   PO2ART 252 (H) 02/12/2024 2239   HCO3 13.8 (L) 02/12/2024 2239   ACIDBASEDEF 11.1 (H) 02/12/2024 2239   O2SAT 100 02/12/2024 2239     Coagulation Profile: Recent Labs  Lab 02/10/24 0213  INR 2.5*    Cardiac Enzymes: No results for input(s): CKTOTAL, CKMB, CKMBINDEX, TROPONINI in the last 168 hours.  HbA1C: Hgb A1c MFr Bld  Date/Time Value Ref Range Status  01/24/2024 10:05 AM 8.0 (H) 4.8 - 5.6 % Final    Comment:    (NOTE) Diagnosis of Diabetes The following HbA1c ranges recommended by the American Diabetes Association (ADA) may be used as an aid in the diagnosis of diabetes mellitus.  Hemoglobin             Suggested A1C NGSP%              Diagnosis  <5.7                   Non Diabetic  5.7-6.4                Pre-Diabetic  >6.4                   Diabetic  <7.0                   Glycemic  control for  adults with diabetes.    06/24/2015 04:42 PM 7.9 (H) 4.0 - 6.0 % Final    CBG: Recent Labs  Lab 02/12/24 1149 02/12/24 1635 02/12/24 2121 02/12/24 2227 02/12/24 2243  GLUCAP 86 88 129* 123* 135*    Review of Systems:     Past Medical History  She,  has a past medical history of Allergic genetic state, Atrial fibrillation (HCC) (2010), Cardiomyopathy (HCC), CHF (congestive heart failure) (HCC), Diabetes mellitus without complication (HCC), Dysrhythmia, Gout, H/O cardiac catheterization, Hypertension, Joint pain, Osteopenia, Presence of permanent cardiac pacemaker, and Psoriasis.   Surgical History    Past Surgical History:  Procedure Laterality Date   ABDOMINAL HYSTERECTOMY  1985   BREAST EXCISIONAL BIOPSY Left    negative years ago   BREAST SURGERY Left 1997   papilloma   CARDIAC CATHETERIZATION Left 08/01/2015   Procedure: Left Heart Cath and Coronary Angiography;  Surgeon: Vinie DELENA Jude, MD;  Location: [facility] INVASIVE CV LAB;  Service: Cardiovascular;  Laterality: Left;   CHOLECYSTECTOMY  1986   COLONOSCOPY WITH PROPOFOL  N/A 08/26/2017   Procedure: COLONOSCOPY WITH PROPOFOL ;  Surgeon: Toledo, Ladell POUR, MD;  Location: [facility] ENDOSCOPY;  Service: Gastroenterology;  Laterality: N/A;   ESOPHAGOGASTRODUODENOSCOPY (EGD) WITH PROPOFOL  N/A 08/26/2017   Procedure: ESOPHAGOGASTRODUODENOSCOPY (EGD) WITH PROPOFOL ;  Surgeon: Toledo, Ladell POUR, MD;  Location: [facility] ENDOSCOPY;  Service: Gastroenterology;  Laterality: N/A;   ESOPHAGOGASTRODUODENOSCOPY (EGD) WITH PROPOFOL  N/A 07/04/2019   Procedure: ESOPHAGOGASTRODUODENOSCOPY (EGD) WITH PROPOFOL ;  Surgeon: Toledo, Ladell POUR, MD;  Location: [facility] ENDOSCOPY;  Service: Gastroenterology;  Laterality: N/A;   ESOPHAGOGASTRODUODENOSCOPY (EGD) WITH PROPOFOL  N/A 06/12/2023   Procedure: ESOPHAGOGASTRODUODENOSCOPY (EGD) WITH PROPOFOL ;  Surgeon: Maryruth Ole DASEN, MD;  Location: [facility] ENDOSCOPY;  Service: Endoscopy;   Laterality: N/A;  DM   TUBAL LIGATION  1981     Social History   reports that she has never smoked. She has never used smokeless tobacco. She reports that she does not drink alcohol and does not use drugs.   Family History   Her family history includes Breast cancer (age of onset: 63) in her mother; Cancer (age of onset: 70) in her mother; Coronary artery disease in her father and mother; Diabetes in her mother and son; Stroke in her father.   Allergies Allergies  Allergen Reactions   Doxazosin Other (See Comments)    Cardura - cough   Doxycycline      Abdominal pain   Drug Class [Clindamycin/Lincomycin] Hives   Hydralazine  Hcl Other (See Comments)    gout   Metformin  And Related Swelling   Ciprofloxacin Other (See Comments)    Numbness in face   Iodine Other (See Comments)    NOT CT CONTRAST PER PT   Pioglitazone Other (See Comments)   Sacubitril-Valsartan Cough    Pt states that it gave her a cough and chest palpitations   Semaglutide Nausea Only   Sulfamethoxazole-Trimethoprim Hives   Augmentin [Amoxicillin-Pot Clavulanate] Diarrhea   Dulaglutide Nausea Only   Hctz [Hydrochlorothiazide] Other (See Comments)    Gout   Losartan Diarrhea   Poison Oak Extract Rash   Red Dye #40 (Allura Red) Rash   Home Medications  Prior to Admission medications   Medication Sig Start Date End Date Taking? Authorizing Provider  carvedilol  (COREG ) 12.5 MG tablet Take 1 tablet (12.5 mg total) by mouth 2 (two) times daily with a meal. 01/30/24  Yes Wieting, Richard, MD  gabapentin  (NEURONTIN ) 100 MG capsule Take 100 mg by mouth 2 (two) times daily. 09/28/23  Yes [provider]  insulin  glargine (LANTUS  SOLOSTAR) 100 UNIT/ML Solostar Pen Inject 20 Units into the skin at bedtime. 01/30/24 01/29/25 Yes Wieting, Richard, MD  isosorbide  mononitrate (IMDUR ) 30 MG 24 hr tablet Take 1 tablet (30 mg total) by mouth daily. 01/30/24  Yes Josette Ade, MD  Magnesium  Gluconate 550 MG TABS Take by  mouth.   Yes [provider]  RIVAROXABAN  (XARELTO ) VTE STARTER PACK (15 & 20 MG) Follow package directions: Take one 15mg  tablet by mouth twice a day. On day 22, switch to one 20mg  tablet once a day. Take with food. 01/30/24  Yes Wieting, Richard, MD  spironolactone  (ALDACTONE ) 25 MG tablet Take 1/2 tablet (12.5 mg total) by mouth daily. 01/30/24  Yes Josette Ade, MD  Vitamin D, Ergocalciferol, 2000 units CAPS Take 1 capsule by mouth daily.   Yes [provider]  Scheduled Meds:  albuterol   10 mg Nebulization Once   vitamin C   500 mg Oral BID   carvedilol   3.125 mg Oral BID WC   Chlorhexidine  Gluconate Cloth  6 each Topical Daily   doxylamine  (Sleep)  50 mg Oral QHS   feeding supplement  237 mL Oral BID BM   insulin  aspart  0-15 Units Subcutaneous TID AC & HS   insulin  glargine-yfgn  8 Units Subcutaneous Daily   ipratropium-albuterol   3 mL Nebulization BID   lidocaine   1 patch Transdermal Q24H   multivitamin with minerals  1 tablet Oral Daily   Rivaroxaban   15 mg Oral Q supper   sodium chloride  flush  3 mL Intravenous Q12H   sodium chloride  HYPERTONIC  4 mL Nebulization BID   Continuous Infusions:  sodium chloride  75 mL/hr at 02/12/24 1250   ampicillin -sulbactam (UNASYN ) IV 3 g (02/13/24 0024)   PRN Meds:.acetaminophen , benzonatate , hydrALAZINE , HYDROcodone  bit-homatropine, hydrocortisone  cream, ipratropium-albuterol , menthol , mouth rinse, mouth rinse, oxyCODONE , polyethylene glycol, prochlorperazine , promethazine    Active Hospital Problem list   See systems below  Assessment & Plan:  #Acute Hypoxic Respiratory Failure ISO: #Acute Exacerbation of HFrEF #Parainfluenza Virus 3 Infection #CAP #Pleural Effusion - right-sided; post-thoracentesis on 11/20 concerning for underlying lymphoproliferative disease  -Supplemental O2 as needed titrate to goal 88-92% -BiPAP, wean as tolerated -recent Intubation on 11/18 s/p Extubation 11/20 now High risk for  intubation -Follow intermittent Chest X-ray & ABG as needed -Bronchodilators and Pulmicort nebs -Consider repeat thoracentesis if symptomatic or effusion reaccumulates. -Continue Unasyn  (adjust per cultures).   #Acute on Chronic HFrEF, NYHA II #Cardiac arrest -Brief PEA arrests on 11/19;Likely iso hypoxia and/or underlying cardiac dysfunction. #Elevated Troponin likely demand iso above #PAF / recent PE -Appears volume overloaded on exam.  -BNP: 15900 -ECHO (11/19): LVEF 30-35%, global hypokinesis, moderately reduced RV function with mild enlargement and mild RV thicknes.  -Hold GDMT for now -Strict I/Os -Continue anticoagulation if renal function permits; reassess dose with AKI. Monitor for bleeding, especially post-thoracentesis. -Furosemide  60 IV BID : IV diureses >1L negative per day until approach euvolemia / worsening renal function. -check CVP, Cooximetry may need to initiate Inotrope -Hold beta-blockers during decompensation -Cardiology following  #AKI on CKD stage III: Cr (1.24 ? 2.95 ? 3.68), BUN 35 ? 68 ? 79, eGFR 12 mL/min. Likely multifactorial: pre-renal from hypoperfusion, diuretics, contrast, or post-arrest hypotension. #AGMA with Lactic Acidosis #Hyponatremia -Follow BMP+Mag -Ensure adequate renal perfusion -Avoid nephrotoxic  -Replace lytes -strict I&O -Nephrology consult may need CRRT/iHD if no improvement  #T2DM, poorly controlled  Recent (HgbA1c 8.0%) -Continue basal insulin ; adjust per sliding scale and  CBG. -Target CBG readings 140 to 180 -Follow hypo/hyperglycemic protocol    Best practice:  Diet:  Oral Pain/Anxiety/Delirium protocol (if indicated): No VAP protocol (if indicated): Not indicated DVT prophylaxis: Systemic AC GI prophylaxis: N/A Glucose control:  SSI Yes and Basal insulin  Yes Central venous access:  N/A Arterial line:  N/A Foley:  N/A Mobility:  bed rest  PT/OT consulted: Yes Code Status:  full code Disposition: Stepdown   =  Goals of Care =  Primary Emergency Contact: Colville,Darrell   Critical care time: 55 minutes        Almarie Nose DNP, CCRN, FNP-C, AGACNP-BC Acute Care & Family Nurse Practitioner [facility] PCCM on call pager [phone]

## 2024-02-12 NOTE — Progress Notes (Addendum)
 PROGRESS NOTE    Deborah Dawson  FMW:[id number] DOB: 08-01-1950 DOA: 02/09/2024 PCP: Sadie Manna, MD  [id number]  LOS: 3 days   Brief hospital course:   Assessment & Plan: Deborah Dawson is a 73 y.o. female with a known history of PAF, PE on Xarelto , chronic HFrEF LVEF 25% with recurrent right-sided pleural effusion, PPM-ICD, HTN, IDDM, gout  presents to the emergency department for evaluation of shortness of breath.     Patient was in a usual state of health until earlier this month from 63 2 through 10 8 she was admitted to the hospital for acute on chronic systolic CHF with right-sided pleural effusion  for which she underwent a thoracentesis, on 11/4 to remove 900 cc and again on 11/7 to remove 700 cc.  At that time she also was found to have a small PE for which she was switched from Eliquis  to Xarelto .  She was discharged home and followed up yesterday [facility] with sent for chest x-ray for worsening shortness of breath that developed about 5 days after leaving the hospital.  She reports that she has a chronic cough which has been productive of clear to yellow sputum in the last day.    While awaiting room placement in ED, patient suddenly became unresponsive.  When pulse checked patient found to be in PEA. She underwent 1 round of CPR and ACLS prior to ROSC, she remained obtunded and was emergently intubated for airway protection. Then about 20 minutes later became pulseless in PEA again receiving 1 round of CPR and ACLS prior to her return of ROSC.  Pt was transferred to [facility] team.  Pt was extubated on 11/20 and transferred back to [facility] on 11/21.  # PEA arrest secondary to # Acute hypoxic respiratory failure versus QTc prolongation - Extubated 11/20.   Parainfluenza virus 3 infection --CTA chest found Diffuse bronchial wall thickening with mucous plugging. --hypertonic saline neb BID with DuoNeb  # Moderate to large pleural effusion  with mediastinal lymphadenopathy raising  concern for an underlying lymphoproliferative disease --s/p thoracentesis  # Acute on chronic Heart failure with reduced EF 20 to 25%  --received IV lasix  --hold further diuresis now due to AKI --cont coreg   AKI --start NS@75   #. Mild hyperkalemia, resolved   #.  History of PAF - Continue carvedilol  --resume Xarelto    #. DM2 --reduce glargine from 8u BID to daily --ACHS and SSI   DVT prophylaxis: On:Xarelto   Code Status: Full code  Family Communication: husband updated at bedside today Level of care: Progressive Dispo:   The patient is from: home Anticipated d/c is to: SNF rehab Anticipated d/c date is: ready in 2-3 days   Subjective and Interval History:  Pt reported sore cough, cough, and feeling unwell.     Objective: Vitals:   02/12/24 0500 02/12/24 0841 02/12/24 1149 02/12/24 1634  BP:  128/85 136/89 (!) 123/95  Pulse:  74 67 79  Resp:  18 17 17   Temp:  97.6 F (36.4 C) 97.9 F (36.6 C) (!) 97.5 F (36.4 C)  TempSrc:      SpO2:  96% 99% 94%  Weight: 73.2 kg     Height:        Intake/Output Summary (Last 24 hours) at 02/12/2024 2024 Last data filed at 02/12/2024 1500 Gross per 24 hour  Intake 1041.77 ml  Output 0 ml  Net 1041.77 ml   Filed Weights   02/10/24 0438 02/11/24 0500 02/12/24 0500  Weight: 70.5 kg  71.2 kg 73.2 kg    Examination:   Constitutional: NAD, AAOx3 HEENT: conjunctivae and lids normal, EOMI CV: No cyanosis.   RESP: normal respiratory effort, on RA Neuro: II - XII grossly intact.   Psych: depressed mood and affect.  Appropriate judgement and reason   Data Reviewed: I have personally reviewed labs and imaging studies  Time spent: 50 minutes  [name], MD [facility] If 7PM-7AM, please contact night-coverage 02/12/2024, 8:24 PM

## 2024-02-12 NOTE — Progress Notes (Signed)
 PT Cancellation Note  Patient Details Name: Deborah Dawson MRN: [id number] DOB: 1950-04-18   Cancelled Treatment:     PT attempt, Pt has recently worked with OT. Pt politely requested to hold PT at this time. I just don't feel very well today. Acute PT will continue to follow and progress per current POC.    Rankin KATHEE Essex 02/12/2024, 12:32 PM

## 2024-02-12 NOTE — Progress Notes (Signed)
   02/12/24 1015  Spiritual Encounters  Type of Visit Attempt (pt unavailable)  Care provided to: Pt not available  Referral source Code page  Reason for visit Code  OnCall Visit Yes   Chaplain responded to a RRT but patient being cared for by team and no family present.  Staff indicated patient would be moved to ICU so, Chaplain will ask daytime Chaplain to follow-up with patient and/or family.    Rev. Rana M. Nicholaus, M.Div. Chaplain Resident [facility]

## 2024-02-12 NOTE — Evaluation (Signed)
 Occupational Therapy Evaluation Patient Details Name: Deborah Dawson MRN: [id number] DOB: 1950-09-15 Today's Date: 02/12/2024   History of Present Illness   Pt is a 73 y/o F presenting to ED with c/o worsening dyspnea. Code blue x2 in ED, received 2 rounds of chest compressions and was subsequently intubated. MD assessment includes CAP, recurrent pleural effusions, mild hyperkalemia. Pt underwent thoracocentesis on 02/11/24. PMH significant for PAF, PE on Xarelto , chronic HFrEF LVEF 25%, recurrent R sided pleural effusion, HTN, IDDM, gout.     Clinical Impressions Chart reviewed to date, pt greeted semi supine in bed, oriented x4 but presents with ?STM deficits. Will continue to assess. PTA pt is MOD I-I in ADL/IADL. Pt presents with deficits in strength, endurance, activity tolerance, balance, cognition, affecting safe and optimal ADL completion. Pt tolerates sitting on edge of bed on this date, tolerates lateral scoots up the bed with MIN A. Unable to tolerate STS on this date due to fatigue. Pt educated on importance of continued mobility attempts. Pt is left as received, all needs met. OT will follow.   Spo2 >90% on RA throughout      If plan is discharge home, recommend the following:   A lot of help with walking and/or transfers;A lot of help with bathing/dressing/bathroom     Functional Status Assessment   Patient has had a recent decline in their functional status and demonstrates the ability to make significant improvements in function in a reasonable and predictable amount of time.     Equipment Recommendations   None recommended by OT (defer to next venue of care)     Recommendations for Other Services         Precautions/Restrictions   Precautions Precautions: Fall Recall of Precautions/Restrictions: Intact Restrictions Weight Bearing Restrictions Per Provider Order: No     Mobility Bed Mobility Overal bed mobility: Needs Assistance Bed Mobility:  Supine to Sit, Sit to Supine     Supine to sit: Contact guard, HOB elevated, Used rails Sit to supine: Min assist        Transfers Overall transfer level: Needs assistance   Transfers: Bed to chair/wheelchair/BSC            Lateral/Scoot Transfers: Min assist General transfer comment: unable to tolerate STS on this date      Balance Overall balance assessment: Needs assistance Sitting-balance support: Feet unsupported, Bilateral upper extremity supported Sitting balance-Leahy Scale: Fair                                     ADL either performed or assessed with clinical judgement   ADL Overall ADL's : Needs assistance/impaired Eating/Feeding: Supervision/ safety Eating/Feeding Details (indicate cue type and reason): educated re aspiration precautions Grooming: Supervision/safety;Sitting               Lower Body Dressing: Maximal assistance Lower Body Dressing Details (indicate cue type and reason): anticipate                     Vision Patient Visual Report: No change from baseline Additional Comments: will continue to assess     Perception         Praxis         Pertinent Vitals/Pain Pain Assessment Pain Assessment: Faces Faces Pain Scale: Hurts even more Pain Location: R trunk/ribs Pain Descriptors / Indicators: Sore, Grimacing Pain Intervention(s): Monitored during session, Repositioned  Extremity/Trunk Assessment Upper Extremity Assessment Upper Extremity Assessment: Generalized weakness   Lower Extremity Assessment Lower Extremity Assessment: Generalized weakness       Communication Communication Communication: No apparent difficulties   Cognition Arousal: Alert Behavior During Therapy: WFL for tasks assessed/performed Cognition: Cognition impaired   Orientation impairments: Situation   Memory impairment (select all impairments): Declarative long-term memory, Short-term memory Attention impairment  (select first level of impairment): Sustained attention Executive functioning impairment (select all impairments): Reasoning, Problem solving                   Following commands: Impaired Following commands impaired: Follows one step commands with increased time     Cueing  General Comments   Cueing Techniques: Verbal cues;Visual cues  spo2 >90% on RA throughout, pt is SOB throughout however   Exercises Other Exercises Other Exercises: edu re fole of OT, role of rehab, importance of continued mobility attempts   Shoulder Instructions      Home Living Family/patient expects to be discharged to:: Private residence Living Arrangements: Spouse/significant other Available Help at Discharge: Family;Available PRN/intermittently Type of Home: House Home Access: Stairs to enter [employer] of Steps: 5 Entrance Stairs-Rails: Right;Left Home Layout: Multi-level;Able to live on main level with bedroom/bathroom Alternate Level Stairs-Number of Steps: basement per chart she does not go down there typically   Bathroom Shower/Tub: Producer, Television/film/video: Standard Bathroom Accessibility: Yes   Home Equipment: Standard Walker;Cane - single point          Prior Functioning/Environment Prior Level of Function : Independent/Modified Independent                    OT Problem List: Decreased activity tolerance;Decreased knowledge of use of DME or AE;Decreased strength;Decreased safety awareness;Impaired balance (sitting and/or standing);Decreased cognition;Cardiopulmonary status limiting activity   OT Treatment/Interventions: Self-care/ADL training;Therapeutic exercise;Energy conservation;DME and/or AE instruction;Cognitive remediation/compensation;Therapeutic activities;Modalities;Patient/family education;Balance training      OT Goals(Current goals can be found in the care plan section)   Acute Rehab OT Goals Patient Stated Goal: feel stronger OT  Goal Formulation: With patient Time For Goal Achievement: 02/26/24 Potential to Achieve Goals: Good ADL Goals Pt Will Perform Grooming: with modified independence;sitting;standing Pt Will Perform Lower Body Dressing: with modified independence;sitting/lateral leans;sit to/from stand Pt Will Transfer to Toilet: with modified independence;ambulating Pt Will Perform Toileting - Clothing Manipulation and hygiene: with modified independence;sitting/lateral leans;sit to/from stand   OT Frequency:  Min 2X/week    Co-evaluation              AM-PAC OT 6 Clicks Daily Activity     Outcome Measure Help from another person eating meals?: None Help from another person taking care of personal grooming?: None Help from another person toileting, which includes using toliet, bedpan, or urinal?: A Lot Help from another person bathing (including washing, rinsing, drying)?: A Lot Help from another person to put on and taking off regular upper body clothing?: A Little Help from another person to put on and taking off regular lower body clothing?: A Lot 6 Click Score: 17   End of Session Nurse Communication: Mobility status  Activity Tolerance: Patient limited by fatigue Patient left: in bed;with call bell/phone within reach;with bed alarm set  OT Visit Diagnosis: Other abnormalities of gait and mobility (R26.89);Muscle weakness (generalized) (M62.81)                Time: 8882-8862 OT Time Calculation (min): 20 min Charges:  OT General Charges $  OT Visit: 1 Visit OT Evaluation $OT Eval Moderate Complexity: 1 Mod Therisa Sheffield, OTD OTR/L  02/12/24, 12:44 PM

## 2024-02-12 NOTE — Progress Notes (Signed)
 [facility]    SUBJECTIVE: Status post arrest weakness fatigue pleural effusion status post thoracentesis shortness of breath respiratory failure congestive heart failure   Vitals:   02/12/24 0500 02/12/24 0841 02/12/24 1149 02/12/24 1634  BP:  128/85 136/89 (!) 123/95  Pulse:  74 67 79  Resp:  18 17 17   Temp:  97.6 F (36.4 C) 97.9 F (36.6 C) (!) 97.5 F (36.4 C)  TempSrc:      SpO2:  96% 99% 94%  Weight: 73.2 kg     Height:         Intake/Output Summary (Last 24 hours) at 02/12/2024 1933 Last data filed at 02/12/2024 1500 Gross per 24 hour  Intake 1041.77 ml  Output 10 ml  Net 1031.77 ml      PHYSICAL EXAM  General: Well developed, well nourished, in no acute distress HEENT:  Normocephalic and atramatic Neck:  No JVD.  Lungs: Clear bilaterally to auscultation and percussion. Heart: HRRR . Normal S1 and S2 without gallops or murmurs.  Abdomen: Bowel sounds are positive, abdomen soft and non-tender  Msk:  Back normal, normal gait. Normal strength and tone for age. Extremities: No clubbing, cyanosis or edema.   Neuro: Lethargic altered mental status Psych:  Good affect, responds appropriately   LABS: Basic Metabolic Panel: Recent Labs    02/11/24 0447 02/11/24 0500 02/11/24 1016 02/12/24 0601  NA 137  --   --  130*  K 4.7  --  4.3 4.7  CL 100  --   --  94*  CO2 23  --   --  20*  GLUCOSE 152*  --   --  74  BUN 40*  --   --  68*  CREATININE 1.92*  --   --  2.95*  CALCIUM  8.9  --   --  8.6*  MG  --    < > 2.1 2.3  PHOS 4.7*  --   --  4.8*   < > = values in this interval not displayed.   Liver Function Tests: Recent Labs    02/10/24 0213 02/11/24 0447  AST 93*  --   ALT 41  --   ALKPHOS 99  --   BILITOT 0.8  --   PROT 6.7  --   ALBUMIN 3.3* 3.2*   No results for input(s): LIPASE, AMYLASE in the last 72 hours. CBC: Recent Labs    02/11/24 0500 02/12/24 0601  WBC 11.1* 10.0  HGB 14.2 13.6  HCT 42.3 41.1  MCV 85.1 86.5  PLT 192  155   Cardiac Enzymes: No results for input(s): CKTOTAL, CKMB, CKMBINDEX, TROPONINI in the last 72 hours. BNP: Invalid input(s): POCBNP D-Dimer: No results for input(s): DDIMER in the last 72 hours. Hemoglobin A1C: No results for input(s): HGBA1C in the last 72 hours. Fasting Lipid Panel: No results for input(s): CHOL, HDL, LDLCALC, TRIG, CHOLHDL, LDLDIRECT in the last 72 hours. Thyroid Function Tests: No results for input(s): TSH, T4TOTAL, T3FREE, THYROIDAB in the last 72 hours.  Invalid input(s): FREET3 Anemia Panel: No results for input(s): VITAMINB12, FOLATE, FERRITIN, TIBC, IRON, RETICCTPCT in the last 72 hours.  DG Chest Port 1 View Result Date: 02/12/2024 EXAM: 1 VIEW(S) XRAY OF THE CHEST 02/12/2024 06:20:59 AM COMPARISON: 02/11/2024 CLINICAL HISTORY: Acute respiratory failure with hypoxia (HCC) [id number] FINDINGS: LINES, TUBES AND DEVICES: Left cardiac device stable in place. LUNGS AND PLEURA: No focal pulmonary opacity. Mild pulmonary edema. Mild bilateral interstitial prominence. Trace right pleural effusion. No pneumothorax. HEART  AND MEDIASTINUM: Cardiomegaly. Aortic atherosclerosis. BONES AND SOFT TISSUES: No acute osseous abnormality. IMPRESSION: 1. Mild pulmonary edema with mild bilateral interstitial prominence. 2. Trace right pleural effusion. 3. Cardiomegaly. Electronically signed by: Waddell Calk MD 02/12/2024 08:44 AM EST RP Workstation: [id number]   DG Chest Port 1 View Result Date: 02/11/2024 CLINICAL DATA:  Status post right-sided thoracentesis. EXAM: PORTABLE CHEST 1 VIEW COMPARISON:  02/11/2024. FINDINGS: Low lung volumes. Stable cardiomediastinal contours. Decreased size of a small right pleural effusion with suspected trace residual right pleural effusion. No pneumothorax. Similar mild interstitial edema. Stable left subclavian dual lead pacer. No acute osseous abnormality. IMPRESSION: 1. Decreased size of a small right  pleural effusion with suspected trace residual right pleural effusion. No pneumothorax. 2. Similar mild interstitial edema. Electronically Signed   By: Harrietta Sherry M.D.   On: 02/11/2024 13:21   DG Chest Port 1 View Result Date: 02/11/2024 EXAM: 1 VIEW(S) XRAY OF THE CHEST 02/11/2024 08:25:21 AM COMPARISON: 02/10/2024 CLINICAL HISTORY: Pleural effusion. FINDINGS: LINES, TUBES AND DEVICES: External pacing pads noted. Left subclavian dual lead pacemaker in place. LUNGS AND PLEURA: Low lung volumes. Improved right lung airspace opacities. Mild pulmonary edema. Small right pleural effusion. No pneumothorax. HEART AND MEDIASTINUM: Mild cardiomegaly. Aortic arch atherosclerosis. BONES AND SOFT TISSUES: No acute osseous abnormality. IMPRESSION: 1. Small right pleural effusion. 2. Mild pulmonary edema. 3. Improved right lung airspace opacities. Electronically signed by: Waddell Calk MD 02/11/2024 11:49 AM EST RP Workstation: [id number]     Echo moderately depressed left ventricular function EF of 30 to 35%  TELEMETRY: Paced ventricular paced rate of 70:  ASSESSMENT AND PLAN:  Principal Problem:   CAP (community acquired pneumonia) Active Problems:   Cardiac arrest (HCC) PEA arrest Acute on chronic heart failure HFrEF AKI Paroxysmal atrial fibrillation Diabetes type 2  Plan Acute on hypoxic respiratory failure prolonged QT recent extubation continue supportive respiratory care Parainfluenza infection continue inhalers DuoNebs respiratory support with mucous plugging Acute on chronic heart failure severely depressed left ventricular function continue GDMT Acute renal insufficiency hyperkalemia recommend electrolyte correction Diabetes management and control currently insulin  therapy Moderate to large pleural effusion status post thoracentesis   [name], MD 02/12/2024 7:33 PM

## 2024-02-12 NOTE — Progress Notes (Signed)
 PHARMACY CONSULT NOTE - FOLLOW UP  Pharmacy Consult for Electrolyte Monitoring and Replacement   Recent Labs: Potassium (mmol/L)  Date Value  02/11/2024 4.3   Magnesium  (mg/dL)  Date Value  88/79/7974 2.1   Calcium  (mg/dL)  Date Value  88/79/7974 8.9   Albumin (g/dL)  Date Value  88/79/7974 3.2 (L)   Phosphorus (mg/dL)  Date Value  88/79/7974 4.7 (H)   Sodium (mmol/L)  Date Value  02/11/2024 137    Assessment: 73 y/o female with h/o HTN, AFib, nonischemic cardiomyopathy, PPM-ICD, CHF, PE, CHF, PAF, DM, hiatal hernia, hypertensive gastropathy, NAFLD, gout and CKD III who is admitted with CAP, pleural effusions with mediastinal lymphadenopathy, CHF exacerbation and AKI complicated by PEA arrest. Pharmacy is asked to follow and replace electrolytes while in CCU  Diuretics: furosemide  60 mg IV daily  Goal of Therapy:  Potassium 4.0 - 5.1 mmol/L Magnesium  2.0 - 2.4 mg/dL All Other Electrolytes WNL  Plan:  No replacement needed. Pt transferred from ICU. Pharmacy will sign off. Please re-consult if needed.   Cathaleen GORMAN Blanch ,PharmD Clinical Pharmacist 02/12/2024 6:36 AM

## 2024-02-12 NOTE — Progress Notes (Signed)
       NAME: THAI HEMRICK MRN: [id number] DOB : [birth year]         SIGNIFICANT EVENT NOTE   Event: Rapid Response  NAME: CARNELL CASAMENTO MRN: [id number] DOB : Jun 18, 1950    Reason for event   Decreased responsiveness, cyanosis with cool extremities     Past medical history context -History of HFrEF admitted for pneumonia and respiratory failure on 11/18. -PEA arrest x 2 on 11/19 - Transferred from ICU to [facility] on 11/21  Sequence of events - secure chat communication about patient complaining of shortness of breath, nausea/vomiting, not feeling good.  Started on fluids due to low creatinine.  Low urine output all day with bladder scan 31 mL Received Phenergan  earlier -Rapid response about 40 minutes after message  Initial patient rapid assessment Upon my arrival, rapid response team at bedside.  Breathing spontaneously has a pulse, BP on the high side which on repeat was in the 90s over 60s.  Responding to sternal rub only but after a while will open eyes spontaneously but not answering questions.  Very lethargic  02/12/24 2243 -- 84 -- 150/78 Abnormal  100 % Nasal Cannula 1 L/min  02/12/24 2141 -- -- -- -- 96 % Room Air --  02/12/24 21:33:02 -- 58 Abnormal  23 Abnormal  103/65 94 % -- --  02/12/24 21:31:17 -- 66 -- -- 98 % -- --  02/12/24 21:08:02 97.8 F (36.6 C) 76 18 107/78 96 % Room Air --   Physical Exam Constitutional:      Interventions: Nasal cannula in place.     Comments: Lethargic, cyanosis lips, cool extremities  Cardiovascular:     Rate and Rhythm: Normal rate and regular rhythm.  Pulmonary:     Comments: Diminished at bases Neurological:     Mental Status: She is lethargic.      Initial diagnostic considerations/ plan/interventions   Diagnostic Workup   Response to interventions   Final assessment     Assessment:  Concern for impending circulatory/cardiogenic shock in the setting of Recent PEA arrest.  Patient transferred from the ICU several hours  prior  Plan: Recommend transfer to the ICU for closer monitoring Discussed with rapid response team, bedside nursing, PCCM nurse practitioner Almarie MANIFOLD       CRITICAL CARE Performed by: Delayne LULLA Solian   Total critical care time: 30 minutes  Critical care time was exclusive of separately billable procedures and treating other patients.  Critical care was necessary to treat or prevent imminent or life-threatening deterioration.  Critical care was time spent personally by me on the following activities: development of treatment plan with patient and/or surrogate as well as nursing, discussions with consultants, evaluation of patient's response to treatment, examination of patient, obtaining history from patient or surrogate, ordering and performing treatments and interventions, ordering and review of laboratory studies, ordering and review of radiographic studies, pulse oximetry and re-evaluation of patient's condition.

## 2024-02-12 NOTE — Progress Notes (Signed)
 Patient bladder scanned x3 0ml noted. NP Jenita Rust-Chester Notified, patient requested foley removed and no urine noted on bladder scan. Ordered to remove foley. Patient assisted to bedside commode after Foley removal due to urge, no urine noted at this time patient did have a bowel movement.

## 2024-02-12 NOTE — Plan of Care (Signed)
  Problem: Education: Goal: Ability to describe self-care measures that may prevent or decrease complications (Diabetes Survival Skills Education) will improve Outcome: Progressing Goal: Individualized Educational Video(s) Outcome: Progressing   Problem: Coping: Goal: Ability to adjust to condition or change in health will improve Outcome: Progressing   Problem: Fluid Volume: Goal: Ability to maintain a balanced intake and output will improve Outcome: Progressing   Problem: Health Behavior/Discharge Planning: Goal: Ability to identify and utilize available resources and services will improve Outcome: Progressing Goal: Ability to manage health-related needs will improve Outcome: Progressing   Problem: Metabolic: Goal: Ability to maintain appropriate glucose levels will improve Outcome: Progressing   Problem: Nutritional: Goal: Maintenance of adequate nutrition will improve Outcome: Progressing Goal: Progress toward achieving an optimal weight will improve Outcome: Progressing   Problem: Skin Integrity: Goal: Risk for impaired skin integrity will decrease Outcome: Progressing   Problem: Tissue Perfusion: Goal: Adequacy of tissue perfusion will improve Outcome: Progressing   Problem: Activity: Goal: Ability to tolerate increased activity will improve Outcome: Progressing   Problem: Respiratory: Goal: Ability to maintain a clear airway and adequate ventilation will improve Outcome: Progressing   Problem: Role Relationship: Goal: Method of communication will improve Outcome: Progressing   Problem: Education: Goal: Knowledge of General Education information will improve Description: Including pain rating scale, medication(s)/side effects and non-pharmacologic comfort measures Outcome: Progressing   Problem: Health Behavior/Discharge Planning: Goal: Ability to manage health-related needs will improve Outcome: Progressing   Problem: Clinical Measurements: Goal:  Ability to maintain clinical measurements within normal limits will improve Outcome: Progressing Goal: Will remain free from infection Outcome: Progressing Goal: Diagnostic test results will improve Outcome: Progressing Goal: Respiratory complications will improve Outcome: Progressing Goal: Cardiovascular complication will be avoided Outcome: Progressing   Problem: Activity: Goal: Risk for activity intolerance will decrease Outcome: Progressing   Problem: Nutrition: Goal: Adequate nutrition will be maintained Outcome: Progressing   Problem: Coping: Goal: Level of anxiety will decrease Outcome: Progressing   Problem: Elimination: Goal: Will not experience complications related to bowel motility Outcome: Progressing Goal: Will not experience complications related to urinary retention Outcome: Progressing   Problem: Pain Managment: Goal: General experience of comfort will improve and/or be controlled Outcome: Progressing   Problem: Safety: Goal: Ability to remain free from injury will improve Outcome: Progressing   Problem: Skin Integrity: Goal: Risk for impaired skin integrity will decrease Outcome: Progressing   Problem: Activity: Goal: Ability to tolerate increased activity will improve Outcome: Progressing   Problem: Clinical Measurements: Goal: Ability to maintain a body temperature in the normal range will improve Outcome: Progressing   Problem: Respiratory: Goal: Ability to maintain adequate ventilation will improve Outcome: Progressing Goal: Ability to maintain a clear airway will improve Outcome: Progressing

## 2024-02-12 NOTE — TOC CM/SW Note (Signed)
 Transition of Care [facility]) CM/SW Note    Transition of Care [facility]) - Inpatient Brief Assessment   Patient Details  Name: Deborah Dawson MRN: [id number] Date of Birth: 1950/11/24  Transition of Care [facility]) CM/SW Contact:    Alfonso Rummer, LCSW Phone Number: 02/12/2024, 12:27 PM   Clinical Narrative:  LCSW A. Rummer completed toc chart review. No toc needs identified please contact should needs arise.   Transition of Care Asessment: Insurance and Status: Insurance coverage has been reviewed Patient has primary care physician: Yes JACQULIN, VISHWANATH) Home environment has been reviewed: single family home Prior level of function:: independent Prior/Current Home Services: No current home services Social Drivers of Health Review: SDOH reviewed no interventions necessary Readmission risk has been reviewed: No Transition of care needs: no transition of care needs at this time

## 2024-02-12 NOTE — TOC Initial Note (Signed)
 Transition of Care [facility]) - Initial/Assessment Note    Patient Details  Name: Deborah Dawson MRN: [id number] Date of Birth: [birth year]  [facility]) CM/SW Contact:    Alfonso Rummer, LCSW Phone Number: 02/12/2024, 3:36 PM  Clinical Narrative:                 LCSW A. Mishawn Didion with patient and spouse at bedside in rom 18. LCSW A. Rummer informed pt and spouse to skilled nursing recommendations. Pt and spouse asked for time to consider this information and options. Due to this pt request SNF was not completed.   Expected Discharge Plan: Home w Home Health Services Barriers to Discharge: Continued Medical Work up   Patient Goals and CMS Choice Patient states their goals for this hospitalization and ongoing recovery are:: Husband would like for her to return home and be able to do the things that they enjoy doing          Expected Discharge Plan and Services       Living arrangements for the past 2 months: Single Family Home                                      Prior Living Arrangements/Services Living arrangements for the past 2 months: Single Family Home Lives with:: Spouse Patient language and need for interpreter reviewed:: Yes Do you feel safe going back to the place where you live?: Yes      Need for Family Participation in Patient Care: Yes (Comment) Care giver support system in place?: Yes (comment)   Criminal Activity/Legal Involvement Pertinent to Current Situation/Hospitalization: No - Comment as needed  Activities of Daily Living      Permission Sought/Granted Permission sought to share information with : Family Supports                Emotional Assessment Appearance:: Appears stated age Attitude/Demeanor/Rapport: Unable to Assess Affect (typically observed): Unable to Assess   Alcohol / Substance Use: Not Applicable Psych Involvement: No (comment)  Admission diagnosis:  Cardiac arrest (HCC) [I46.9] CAP (community acquired  pneumonia) [J18.9] Dyspnea, unspecified type [R06.00] Community acquired pneumonia, unspecified laterality [J18.9] Patient Active Problem List   Diagnosis Date Noted   Cardiac arrest (HCC) 02/10/2024   CAP (community acquired pneumonia) 02/09/2024   Acute on chronic systolic CHF (congestive heart failure) (HCC) 01/27/2024   Acute kidney injury superimposed on CKD 01/27/2024   Paroxysmal atrial fibrillation (HCC) 01/27/2024   Uncontrolled type 2 diabetes mellitus with hyperglycemia, with long-term current use of insulin  (HCC) 01/27/2024   Pleural effusion on right 01/25/2024   Acute pulmonary embolism (HCC) 01/24/2024   Malnutrition of moderate degree 08/09/2020   COVID-19 with multiple comorbidities 08/08/2020   Acute pulmonary edema (HCC) 06/24/2015   Acute respiratory failure with hypoxia (HCC) 06/24/2015   Malignant essential hypertension 06/24/2015   Chest pain 06/24/2015   Atrial fibrillation with RVR (HCC) 06/24/2015   Leukocytosis 06/24/2015   Heart failure (HCC) 06/24/2015   Encounter for screening colonoscopy 06/23/2014   Chest wall pain 06/23/2014   PCP:  Sadie Manna, MD Pharmacy:   [facility] [phone] [address] [place], [place] - [address] AT [facility] [address] [place] [address] Phone: [phone] Fax: [phone]  [facility] - [facility] [address] [place] [address] Phone: [phone] Fax: [phone]     Social Drivers of Health (  SDOH) Social History: SDOH Screenings   Food Insecurity: Patient Unable To Answer (02/10/2024)  Housing: Patient Unable To Answer (02/10/2024)  Transportation Needs: Patient Unable To Answer (02/10/2024)  Utilities: Patient Unable To Answer (02/10/2024)  Financial Resource Strain: Low Risk  (11/02/2023)   Received from [facility]  Social Connections: Patient Unable To Answer (02/10/2024)  Tobacco Use: Low Risk  (02/08/2024)   Received  from [facility]   SDOH Interventions:     Readmission Risk Interventions     No data to display

## 2024-02-12 NOTE — Progress Notes (Signed)
 Per night shift report, patient has not urinated. Foley removed at 5am. Pt up to [facility] to attempt to urinate at 6am. No urine. Notified Dr Awanda about concerns and labs. No new orders at this time. NAD.

## 2024-02-13 ENCOUNTER — Inpatient Hospital Stay

## 2024-02-13 DIAGNOSIS — I509 Heart failure, unspecified: Secondary | ICD-10-CM | POA: Diagnosis not present

## 2024-02-13 DIAGNOSIS — N179 Acute kidney failure, unspecified: Secondary | ICD-10-CM

## 2024-02-13 DIAGNOSIS — I469 Cardiac arrest, cause unspecified: Secondary | ICD-10-CM | POA: Diagnosis not present

## 2024-02-13 DIAGNOSIS — J189 Pneumonia, unspecified organism: Secondary | ICD-10-CM | POA: Diagnosis not present

## 2024-02-13 LAB — BASIC METABOLIC PANEL WITH GFR
Anion gap: 17 — ABNORMAL HIGH (ref 5–15)
Anion gap: 18 — ABNORMAL HIGH (ref 5–15)
Anion gap: 19 — ABNORMAL HIGH (ref 5–15)
Anion gap: 20 — ABNORMAL HIGH (ref 5–15)
BUN: 73 mg/dL — ABNORMAL HIGH (ref 8–23)
BUN: 79 mg/dL — ABNORMAL HIGH (ref 8–23)
BUN: 80 mg/dL — ABNORMAL HIGH (ref 8–23)
BUN: 84 mg/dL — ABNORMAL HIGH (ref 8–23)
CO2: 17 mmol/L — ABNORMAL LOW (ref 22–32)
CO2: 18 mmol/L — ABNORMAL LOW (ref 22–32)
CO2: 19 mmol/L — ABNORMAL LOW (ref 22–32)
CO2: 20 mmol/L — ABNORMAL LOW (ref 22–32)
Calcium: 8.5 mg/dL — ABNORMAL LOW (ref 8.9–10.3)
Calcium: 8.7 mg/dL — ABNORMAL LOW (ref 8.9–10.3)
Calcium: 8.7 mg/dL — ABNORMAL LOW (ref 8.9–10.3)
Calcium: 8.8 mg/dL — ABNORMAL LOW (ref 8.9–10.3)
Chloride: 90 mmol/L — ABNORMAL LOW (ref 98–111)
Chloride: 92 mmol/L — ABNORMAL LOW (ref 98–111)
Chloride: 92 mmol/L — ABNORMAL LOW (ref 98–111)
Chloride: 93 mmol/L — ABNORMAL LOW (ref 98–111)
Creatinine, Ser: 3.68 mg/dL — ABNORMAL HIGH (ref 0.44–1.00)
Creatinine, Ser: 3.83 mg/dL — ABNORMAL HIGH (ref 0.44–1.00)
Creatinine, Ser: 3.99 mg/dL — ABNORMAL HIGH (ref 0.44–1.00)
Creatinine, Ser: 4.15 mg/dL — ABNORMAL HIGH (ref 0.44–1.00)
GFR, Estimated: 11 mL/min — ABNORMAL LOW (ref >60)
GFR, Estimated: 11 mL/min — ABNORMAL LOW (ref >60)
GFR, Estimated: 12 mL/min — ABNORMAL LOW (ref >60)
GFR, Estimated: 12 mL/min — ABNORMAL LOW (ref >60)
Glucose, Bld: 117 mg/dL — ABNORMAL HIGH (ref 70–99)
Glucose, Bld: 118 mg/dL — ABNORMAL HIGH (ref 70–99)
Glucose, Bld: 137 mg/dL — ABNORMAL HIGH (ref 70–99)
Glucose, Bld: 87 mg/dL (ref 70–99)
Potassium: 4.8 mmol/L (ref 3.5–5.1)
Potassium: 5 mmol/L (ref 3.5–5.1)
Potassium: 5.4 mmol/L — ABNORMAL HIGH (ref 3.5–5.1)
Potassium: 5.8 mmol/L — ABNORMAL HIGH (ref 3.5–5.1)
Sodium: 126 mmol/L — ABNORMAL LOW (ref 135–145)
Sodium: 128 mmol/L — ABNORMAL LOW (ref 135–145)
Sodium: 130 mmol/L — ABNORMAL LOW (ref 135–145)
Sodium: 130 mmol/L — ABNORMAL LOW (ref 135–145)

## 2024-02-13 LAB — MAGNESIUM: Magnesium: 2.6 mg/dL — ABNORMAL HIGH (ref 1.7–2.4)

## 2024-02-13 LAB — GLUCOSE, CAPILLARY
Glucose-Capillary: 128 mg/dL — ABNORMAL HIGH (ref 70–99)
Glucose-Capillary: 121 mg/dL — ABNORMAL HIGH (ref 70–99)
Glucose-Capillary: 72 mg/dL (ref 70–99)
Glucose-Capillary: 95 mg/dL (ref 70–99)

## 2024-02-13 LAB — CBC
HCT: 40.2 % (ref 36.0–46.0)
Hemoglobin: 13.2 g/dL (ref 12.0–15.0)
MCH: 28.5 pg (ref 26.0–34.0)
MCHC: 32.8 g/dL (ref 30.0–36.0)
MCV: 86.8 fL (ref 80.0–100.0)
Platelets: 172 K/uL (ref 150–400)
RBC: 4.63 MIL/uL (ref 3.87–5.11)
RDW: 16.7 % — ABNORMAL HIGH (ref 11.5–15.5)
WBC: 9.3 K/uL (ref 4.0–10.5)
nRBC: 0 % (ref 0.0–0.2)

## 2024-02-13 LAB — LACTIC ACID, PLASMA
Lactic Acid, Venous: 2.5 mmol/L (ref 0.5–1.9)
Lactic Acid, Venous: 2.6 mmol/L (ref 0.5–1.9)

## 2024-02-13 LAB — PRO BRAIN NATRIURETIC PEPTIDE: Pro Brain Natriuretic Peptide: >35000 pg/mL — ABNORMAL HIGH (ref <300.0)

## 2024-02-13 MED ORDER — DAPAGLIFLOZIN PROPANEDIOL 10 MG PO TABS
10.0000 mg | ORAL_TABLET | Freq: Every day | ORAL | Status: DC
  Filled 2024-02-13 (×2): qty 1

## 2024-02-13 MED ORDER — INSULIN ASPART 100 UNIT/ML IJ SOLN
0.0000 [IU] | Freq: Three times a day (TID) | INTRAMUSCULAR | Status: DC
  Administered 2024-02-15 – 2024-02-20 (×7): 1 [IU] via SUBCUTANEOUS
  Administered 2024-02-20: 2 [IU] via SUBCUTANEOUS
  Administered 2024-02-21 (×2): 1 [IU] via SUBCUTANEOUS
  Administered 2024-02-22: 2 [IU] via SUBCUTANEOUS
  Administered 2024-02-22: 3 [IU] via SUBCUTANEOUS
  Administered 2024-02-22: 1 [IU] via SUBCUTANEOUS
  Filled 2024-02-13 (×6): qty 1
  Filled 2024-02-13: qty 2
  Filled 2024-02-13 (×4): qty 1
  Filled 2024-02-13: qty 2

## 2024-02-13 MED ORDER — DEXTROSE 50 % IV SOLN
50.0000 mL | Freq: Once | INTRAVENOUS | Status: AC
  Administered 2024-02-13: 50 mL via INTRAVENOUS
  Filled 2024-02-13: qty 50

## 2024-02-13 MED ORDER — SODIUM BICARBONATE 8.4 % IV SOLN
50.0000 meq | Freq: Once | INTRAVENOUS | Status: AC
  Administered 2024-02-13: 50 meq via INTRAVENOUS
  Filled 2024-02-13: qty 50

## 2024-02-13 MED ORDER — INSULIN ASPART 100 UNIT/ML IV SOLN
10.0000 [IU] | Freq: Once | INTRAVENOUS | Status: AC
  Administered 2024-02-13: 10 [IU] via INTRAVENOUS
  Filled 2024-02-13: qty 10
  Filled 2024-02-13: qty 0.1

## 2024-02-13 MED ORDER — SODIUM BICARBONATE 8.4 % IV SOLN
100.0000 meq | Freq: Once | INTRAVENOUS | Status: AC
  Administered 2024-02-13: 100 meq via INTRAVENOUS
  Filled 2024-02-13: qty 50

## 2024-02-13 MED ORDER — GABAPENTIN 100 MG PO CAPS
100.0000 mg | ORAL_CAPSULE | Freq: Two times a day (BID) | ORAL | Status: DC
  Administered 2024-02-13 – 2024-02-15 (×6): 100 mg via ORAL
  Filled 2024-02-13 (×7): qty 1

## 2024-02-13 MED ORDER — APIXABAN 2.5 MG PO TABS: 2.5000 mg | ORAL_TABLET | Freq: Two times a day (BID) | ORAL | Status: DC

## 2024-02-13 MED ORDER — APIXABAN 5 MG PO TABS
5.0000 mg | ORAL_TABLET | Freq: Two times a day (BID) | ORAL | Status: DC
  Administered 2024-02-14 – 2024-02-22 (×16): 5 mg via ORAL
  Filled 2024-02-13 (×16): qty 1

## 2024-02-13 MED ORDER — PATIROMER SORBITEX CALCIUM 8.4 G PO PACK
16.8000 g | PACK | Freq: Every day | ORAL | Status: DC
  Administered 2024-02-13 – 2024-02-14 (×2): 16.8 g via ORAL
  Filled 2024-02-13 (×2): qty 2

## 2024-02-13 NOTE — Progress Notes (Signed)
   02/13/24 0715  Spiritual Encounters  Type of Visit Attempt (pt unavailable)  Care provided to: Pt not available  Reason for visit Routine spiritual support  OnCall Visit Yes   Patient was a RRT last night but Chaplain couldn't see her and there was no family present.  Chaplain attempted to follow-up with patient this morning; however, she was sleeping.    Rev. Rana M. Nicholaus, M.Div. Chaplain Resident [facility]

## 2024-02-13 NOTE — Progress Notes (Signed)
 RT responded to Rapid Response. Upon arrival, patient was cool, clammy. Patient was lethargic, but spontaneously breathing. HR-60, RR-14, SpO2 was 97% on nasal cannula. Placed patient on NRB mask. Physician at bedside. ABG drawn and resulted. Orders to transfer patient to ICU. Patient is becoming more arousable and is answering questions. Placed patient back on nasal cannula. Will continue to monitor.

## 2024-02-13 NOTE — Plan of Care (Signed)
  Problem: Education: Goal: Ability to describe self-care measures that may prevent or decrease complications (Diabetes Survival Skills Education) will improve Outcome: Progressing Goal: Individualized Educational Video(s) Outcome: Progressing   Problem: Coping: Goal: Ability to adjust to condition or change in health will improve Outcome: Progressing   Problem: Fluid Volume: Goal: Ability to maintain a balanced intake and output will improve Outcome: Progressing   Problem: Health Behavior/Discharge Planning: Goal: Ability to identify and utilize available resources and services will improve Outcome: Progressing Goal: Ability to manage health-related needs will improve Outcome: Progressing   Problem: Metabolic: Goal: Ability to maintain appropriate glucose levels will improve Outcome: Progressing   Problem: Nutritional: Goal: Maintenance of adequate nutrition will improve Outcome: Progressing Goal: Progress toward achieving an optimal weight will improve Outcome: Progressing   Problem: Skin Integrity: Goal: Risk for impaired skin integrity will decrease Outcome: Progressing   Problem: Tissue Perfusion: Goal: Adequacy of tissue perfusion will improve Outcome: Progressing   Problem: Activity: Goal: Ability to tolerate increased activity will improve Outcome: Progressing   Problem: Respiratory: Goal: Ability to maintain a clear airway and adequate ventilation will improve Outcome: Progressing   Problem: Role Relationship: Goal: Method of communication will improve Outcome: Progressing   Problem: Education: Goal: Knowledge of General Education information will improve Description: Including pain rating scale, medication(s)/side effects and non-pharmacologic comfort measures Outcome: Progressing   Problem: Health Behavior/Discharge Planning: Goal: Ability to manage health-related needs will improve Outcome: Progressing   Problem: Clinical Measurements: Goal:  Ability to maintain clinical measurements within normal limits will improve Outcome: Progressing Goal: Will remain free from infection Outcome: Progressing Goal: Diagnostic test results will improve Outcome: Progressing Goal: Respiratory complications will improve Outcome: Progressing Goal: Cardiovascular complication will be avoided Outcome: Progressing   Problem: Activity: Goal: Risk for activity intolerance will decrease Outcome: Progressing   Problem: Nutrition: Goal: Adequate nutrition will be maintained Outcome: Progressing   Problem: Coping: Goal: Level of anxiety will decrease Outcome: Progressing   Problem: Elimination: Goal: Will not experience complications related to bowel motility Outcome: Progressing Goal: Will not experience complications related to urinary retention Outcome: Progressing   Problem: Pain Managment: Goal: General experience of comfort will improve and/or be controlled Outcome: Progressing   Problem: Safety: Goal: Ability to remain free from injury will improve Outcome: Progressing   Problem: Skin Integrity: Goal: Risk for impaired skin integrity will decrease Outcome: Progressing   Problem: Activity: Goal: Ability to tolerate increased activity will improve Outcome: Progressing   Problem: Clinical Measurements: Goal: Ability to maintain a body temperature in the normal range will improve Outcome: Progressing   Problem: Respiratory: Goal: Ability to maintain adequate ventilation will improve Outcome: Progressing Goal: Ability to maintain a clear airway will improve Outcome: Progressing

## 2024-02-13 NOTE — Progress Notes (Signed)
 SUBJECTIVE: Patient is somewhat better but still coughing however shortness of breath has improved.   Vitals:   02/13/24 1130 02/13/24 1200 02/13/24 1230 02/13/24 1300  BP:  (!) 147/80  122/87  Pulse: 78 71 80 84  Resp: (!) 32 (!) 26 16 (!) 23  Temp:  97.6 F (36.4 C)    TempSrc:  Oral    SpO2: 98% 96% 94% 97%  Weight:      Height:        Intake/Output Summary (Last 24 hours) at 02/13/2024 1405 Last data filed at 02/13/2024 1245 Gross per 24 hour  Intake 221.77 ml  Output 10 ml  Net 211.77 ml    LABS: Basic Metabolic Panel: Recent Labs    02/11/24 0447 02/11/24 0500 02/12/24 0601 02/12/24 2346 02/13/24 0325 02/13/24 1116  NA 137  --  130* 126* 128* 130*  K 4.7   < > 4.7 5.8* 5.4* 4.8  CL 100  --  94* 92* 90* 93*  CO2 23  --  20* 17* 18* 20*  GLUCOSE 152*  --  74 137* 118* 117*  BUN 40*  --  68* 79* 73* 84*  CREATININE 1.92*  --  2.95* 3.68* 3.83* 3.99*  CALCIUM  8.9  --  8.6* 8.7* 8.8* 8.7*  MG  --    < > 2.3 2.6*  --   --   PHOS 4.7*  --  4.8*  --   --   --    < > = values in this interval not displayed.   Liver Function Tests: Recent Labs    02/11/24 0447  ALBUMIN 3.2*   No results for input(s): LIPASE, AMYLASE in the last 72 hours. CBC: Recent Labs    02/12/24 0601 02/13/24 0630  WBC 10.0 9.3  HGB 13.6 13.2  HCT 41.1 40.2  MCV 86.5 86.8  PLT 155 172   Cardiac Enzymes: No results for input(s): CKTOTAL, CKMB, CKMBINDEX, TROPONINI in the last 72 hours. BNP: Invalid input(s): POCBNP D-Dimer: No results for input(s): DDIMER in the last 72 hours. Hemoglobin A1C: No results for input(s): HGBA1C in the last 72 hours. Fasting Lipid Panel: No results for input(s): CHOL, HDL, LDLCALC, TRIG, CHOLHDL, LDLDIRECT in the last 72 hours. Thyroid Function Tests: No results for input(s): TSH, T4TOTAL, T3FREE, THYROIDAB in the last 72 hours.  Invalid input(s): FREET3 Anemia Panel: No results for input(s):  VITAMINB12, FOLATE, FERRITIN, TIBC, IRON, RETICCTPCT in the last 72 hours.   PHYSICAL EXAM General: Well developed, well nourished, in no acute distress HEENT:  Normocephalic and atramatic Neck:  No JVD.  Lungs: Clear bilaterally to auscultation and percussion. Heart: HRRR . Normal S1 and S2 without gallops or murmurs.  Abdomen: Bowel sounds are positive, abdomen soft and non-tender  Msk:  Back normal, normal gait. Normal strength and tone for age. Extremities: No clubbing, cyanosis or edema.   Neuro: Alert and oriented X 3. Psych:  Good affect, responds appropriately  TELEMETRY: Sinus rhythm  ASSESSMENT AND PLAN: Deborah Dawson is a 73 y.o. female  with a past medical history of chronic HFrEF, s/p CRT-P 07/2021, persistent atrial fibrillation, hypertension, type 2 diabetes who presented to the ED on 02/09/2024 for shortness of breath. CTA chest concerning for PNA. Overnight had PEA arrest x2 with ROSC. Cardiology was consulted for further evaluation.    # PEA arrest # Pneumonia # Acute on chronic HFrEF # S/p CRT-P 07/2021 # Persistent atrial fibrillation Patient initially presented with shortness of breath, concern for  pneumonia on CTA chest.  Overnight last night she had PEA arrest with ROSC then shortly afterwards arrested again.  Intubated in the ED.  Troponins mildly elevated and flat trending.  BNP significantly elevated at 15,900. EKG without acute ischemic changes.  Echo this admission with EF 30-35%, global hypokinesis, moderately reduced RV function with mild enlargement and mild RV thicknes. -Continue IV Lasix  60 mg twice daily. -GDMT meds currently held.  -Mildly elevated and flat troponins most consistent with demand/supply mismatch and not ACS.  -Further management per PCCM.    ICD-10-CM   1. Dyspnea, unspecified type  R06.00     2. Community acquired pneumonia, unspecified laterality  J18.9       Principal Problem:   CAP (community acquired  pneumonia) Active Problems:   Cardiac arrest [facility])    Deborah Bathe, MD, [facility] 02/13/2024 2:05 PM

## 2024-02-13 NOTE — Progress Notes (Signed)
 Per [facility] Acute PT protocols; pt had change in status and moved to higher level of care. Rapid response called 02/12/24. Will need new PT orders to resume acute PT POC.

## 2024-02-13 NOTE — Progress Notes (Signed)
 he was complaing of SOB, nausea /vomiting and not feeling good overall. she was on fluids 59mls/hr due to low urine output after foley was out which  is stopped. her lung sound were crackles. Bladder scan on her she only had 31ml retained. IV lasix   was given and 30 minutes later I went to the room to gave her compazine . found her gasping for air and then she went somnolent.. Rapip response was called and was at bedside

## 2024-02-13 NOTE — Progress Notes (Signed)
 PROGRESS NOTE    Deborah Dawson  FMW:[id number] DOB: 07/08/1950 DOA: 02/09/2024 PCP: Sadie Manna, MD  [id number]  LOS: 4 days   Brief hospital course:   Assessment & Plan: Deborah Dawson is a 73 y.o. female with a known history of PAF, PE on Xarelto , chronic HFrEF LVEF 25% with recurrent right-sided pleural effusion, PPM-ICD, HTN, IDDM, gout  presents to the emergency department for evaluation of shortness of breath.     Patient was in a usual state of health until earlier this month from 33 2 through 90 8 she was admitted to the hospital for acute on chronic systolic CHF with right-sided pleural effusion  for which she underwent a thoracentesis, on 11/4 to remove 900 cc and again on 11/7 to remove 700 cc.  At that time she also was found to have a small PE for which she was switched from Eliquis  to Xarelto .  She was discharged home and followed up yesterday [facility] with sent for chest x-ray for worsening shortness of breath that developed about 5 days after leaving the hospital.  She reports that she has a chronic cough which has been productive of clear to yellow sputum in the last day.    While awaiting room placement in ED, patient suddenly became unresponsive.  When pulse checked patient found to be in PEA. She underwent 1 round of CPR and ACLS prior to ROSC, she remained obtunded and was emergently intubated for airway protection. Then about 20 minutes later became pulseless in PEA again receiving 1 round of CPR and ACLS prior to her return of ROSC.  Pt was transferred to [facility] team.  Pt was extubated on 11/20 and transferred back to [facility] on 11/21.  # PEA arrest secondary to --unclear etiology.   --monitor on tele  # Acute hypoxic respiratory failure  --initial CTA chest showed Diffuse bronchial wall thickening with mucous plugging and then a day later showed rapid development of pulm edema and right pleural effusion. --treated with diuresis and thoracentesis --cont  hypertonic saline neb BID with DuoNeb  Parainfluenza virus 3 infection --cough and congestion --cont hypertonic saline neb BID with DuoNeb --Tessalon  and hycodan PRN  # Moderate to large pleural effusion  with mediastinal lymphadenopathy raising concern for an underlying lymphoproliferative disease --s/p thoracentesis  # Acute on chronic Heart failure with reduced EF 20 to 25%  --received IV lasix  --hold further diuresis now due to AKI --cont coreg   AKI  Metabolic acidosis --very reduced urine output --nephro consult today --IV Na bicarb  #. Mild hyperkalemia --due to AKI --start Veltassa  daily   #.  History of PAF - Continue carvedilol  --switch from Xarelto  to Eliquis  due to reduced renal function   #. DM2 --cont glargine 8u daily --reduce SSI to very sensitive scale   DVT prophylaxis: On:Xarelto   Code Status: Full code  Family Communication:  Level of care: Stepdown Dispo:   The patient is from: home Anticipated d/c is to: SNF rehab Anticipated d/c date is: > 3 days   Subjective and Interval History:  Overnight, rapid response called about pt's Decreased responsiveness, cyanosis with cool extremities and transferred pt back to stepdown.  Pt said she received Unisom  and finally felt sleepy, and didn't know what happened last night.  Pt seemed more energetic today.  Continued to have no urine output.  Nephro consulted, rec holding off IVF for now.   Objective: Vitals:   02/13/24 1300 02/13/24 1400 02/13/24 1500 02/13/24 1600  BP: 122/87 (!) 126/97  128/86 (!) 135/98  Pulse: 84 72 69 83  Resp: (!) 23 18 15 17   Temp:      TempSrc:      SpO2: 97% 100% 100% 100%  Weight:      Height:        Intake/Output Summary (Last 24 hours) at 02/13/2024 1729 Last data filed at 02/13/2024 1612 Gross per 24 hour  Intake 240 ml  Output 10 ml  Net 230 ml   Filed Weights   02/11/24 0500 02/12/24 0500 02/13/24 0500  Weight: 71.2 kg 73.2 kg 70.9 kg     Examination:   Constitutional: NAD, AAOx3 HEENT: conjunctivae and lids normal, EOMI CV: No cyanosis.   RESP: normal respiratory effort Neuro: II - XII grossly intact.   Psych: better mood and affect than yesterday.  Appropriate judgement and reason   Data Reviewed: I have personally reviewed labs and imaging studies  Time spent: 50 minutes  [name], MD [facility] If 7PM-7AM, please contact night-coverage 02/13/2024, 5:29 PM

## 2024-02-13 NOTE — Plan of Care (Signed)
  Problem: Education: Goal: Ability to describe self-care measures that may prevent or decrease complications (Diabetes Survival Skills Education) will improve Outcome: Progressing   Problem: Coping: Goal: Ability to adjust to condition or change in health will improve Outcome: Progressing   Problem: Health Behavior/Discharge Planning: Goal: Ability to identify and utilize available resources and services will improve Outcome: Progressing   Problem: Skin Integrity: Goal: Risk for impaired skin integrity will decrease Outcome: Progressing   Problem: Activity: Goal: Ability to tolerate increased activity will improve Outcome: Progressing   Problem: Pain Managment: Goal: General experience of comfort will improve and/or be controlled Outcome: Progressing

## 2024-02-13 NOTE — Consult Note (Signed)
 [facility] Consult Note:02/13/24     Date of Admission:  02/09/2024           Reason for Consult: AKI    Referring Provider: Awanda City, MD Primary Care Provider: Sadie Manna, MD   History of Presenting Illness:  Deborah Dawson is a 73 y.o. female with a known history of PAF, PE on Xarelto , chronic HFrEF LVEF 25% with recurrent right-sided pleural effusion, PPM-ICD, HTN, IDDM, gout  presents to the emergency department for evaluation of shortness of breath.   Patient had evaluation for pleural effusion secondary to acute on chronic systolic CHF.  She underwent thoracentesis.  She also has a recent history of primary embolism. She is admitted for worsening shortness of breath. Hospital course has been complicated by PEA arrest the night of 02/10/2024.  She required 3 rounds of resuscitation.  She was subsequently intubated.  CT of the chest with recurrent large right pleural effusion.  CT angiogram of the chest on 02/09/2024 was negative for pulmonary embolism.  Diffuse bronchial wall thickening with mucous plugging, hilar and mediastinal lymphadenopathy CT of chest on 02/10/2024 postcode showed multiple consolidation greatest in the right upper lobe, moderate right pleural effusion, enlarged central pulmonary arteries suspicious for acute pulmonary embolism  Nephrology consult is now requested for evaluation of AKI.  Baseline creatinine around 0.2-1.4 on November 18/November 19.  Since then creatinine has been steadily increasing and is up to 3.83 today .  Patient is oligoanuric. She has developed electrolyte abnormalities including hyponatremia, hyperkalemia, metabolic acidosis, high BUN.  BNP is elevated.  Lactic acid is also elevated.   Review of Systems: ROS Gen: Denies any fevers or chills HEENT: No vision or hearing problems CV: No chest pain but does report shortness of breath Resp: No cough or sputum production GI: No nausea, vomiting or diarrhea.  No  blood in the stool GU : low UOP. Plan for in and out MS: Ambulatory.  Denies any acute joint pain or swelling Derm:   No complaints Psych: No complaints Heme: No complaints Neuro: No complaints Endocrine: No complaints   Past Medical History:  Diagnosis Date   Allergic genetic state    Atrial fibrillation (HCC) 2010   Cardiomyopathy (HCC)    CHF (congestive heart failure) (HCC)    Diabetes mellitus without complication (HCC)    Dysrhythmia    Gout    H/O cardiac catheterization    Hypertension    Joint pain    Osteopenia    Presence of permanent cardiac pacemaker    Psoriasis     Social History   Tobacco Use   Smoking status: Never   Smokeless tobacco: Never  Vaping Use   Vaping status: Never Used  Substance Use Topics   Alcohol use: No    Alcohol/week: 0.0 standard drinks of alcohol   Drug use: No    Family History  Problem Relation Age of Onset   Cancer Mother 17       breast   Diabetes Mother    Breast cancer Mother 26   Coronary artery disease Mother    Stroke Father    Coronary artery disease Father    Diabetes Son      OBJECTIVE: Blood pressure 121/76, pulse 82, temperature 97.6 F (36.4 C), temperature source Oral, resp. rate (!) 21, height 5' 1 (1.549 m), weight 70.9 kg, SpO2 100%.  Physical Exam General Appearance-no acute distress HEENT-moist mucous membranes Pulmonary-decreased breath sounds on the right side, mild coarse  breath sounds on the left Cardiac-soft systolic murmur, paced rhythm Abdomen-soft, nontender, nondistended Extremities-no peripheral edema Neuro-alert and oriented Skin-no acute rashes, warm, dry  Lab Results Lab Results  Component Value Date   WBC 9.3 02/13/2024   HGB 13.2 02/13/2024   HCT 40.2 02/13/2024   MCV 86.8 02/13/2024   PLT 172 02/13/2024    Lab Results  Component Value Date   CREATININE 3.83 (H) 02/13/2024   BUN 73 (H) 02/13/2024   NA 128 (L) 02/13/2024   K 5.4 (H) 02/13/2024   CL 90 (L)  02/13/2024   CO2 18 (L) 02/13/2024    Lab Results  Component Value Date   ALT 41 02/10/2024   AST 93 (H) 02/10/2024   ALKPHOS 99 02/10/2024   BILITOT 0.8 02/10/2024     Microbiology: Recent Results (from the past 240 hours)  Blood culture (routine x 2)     Status: None (Preliminary result)   Collection Time: 02/09/24  9:27 PM   Specimen: BLOOD  Result Value Ref Range Status   Specimen Description BLOOD BLOOD RIGHT ARM  Final   Special Requests   Final    BOTTLES DRAWN AEROBIC AND ANAEROBIC Blood Culture results may not be optimal due to an inadequate volume of blood received in culture bottles   Culture   Final    NO GROWTH 4 DAYS Performed at [facility], [address], [place], [place] [address]    Report Status PENDING  Incomplete  Blood culture (routine x 2)     Status: None (Preliminary result)   Collection Time: 02/09/24  9:27 PM   Specimen: BLOOD  Result Value Ref Range Status   Specimen Description BLOOD BLOOD LEFT ARM  Final   Special Requests   Final    BOTTLES DRAWN AEROBIC AND ANAEROBIC Blood Culture results may not be optimal due to an inadequate volume of blood received in culture bottles   Culture   Final    NO GROWTH 4 DAYS Performed at [facility], [address] [place], [place] [address]    Report Status PENDING  Incomplete  MRSA Next Gen by PCR, Nasal     Status: None   Collection Time: 02/10/24  1:49 AM   Specimen: Nasal Mucosa; Nasal Swab  Result Value Ref Range Status   MRSA by PCR Next Gen NOT DETECTED NOT DETECTED Final    Comment: (NOTE) The GeneXpert MRSA Assay (FDA approved for NASAL specimens only), is one component of a comprehensive MRSA colonization surveillance program. It is not intended to diagnose MRSA infection nor to guide or monitor treatment for MRSA infections. Test performance is not FDA approved in patients less than 69 years old. Performed at [facility], [address]  [place], [place] [address]   Respiratory (~20 pathogens) panel by PCR     Status: Abnormal   Collection Time: 02/10/24  1:49 AM   Specimen: Nasopharyngeal Swab; Respiratory  Result Value Ref Range Status   Adenovirus NOT DETECTED NOT DETECTED Final   Coronavirus 229E NOT DETECTED NOT DETECTED Final    Comment: (NOTE) The Coronavirus on the Respiratory Panel, DOES NOT test for the novel  Coronavirus (2019 nCoV)    Coronavirus HKU1 NOT DETECTED NOT DETECTED Final   Coronavirus NL63 NOT DETECTED NOT DETECTED Final   Coronavirus OC43 NOT DETECTED NOT DETECTED Final   Metapneumovirus NOT DETECTED NOT DETECTED Final   Rhinovirus / Enterovirus NOT DETECTED NOT DETECTED Final   Influenza A NOT DETECTED  NOT DETECTED Final   Influenza B NOT DETECTED NOT DETECTED Final   Parainfluenza Virus 1 NOT DETECTED NOT DETECTED Final   Parainfluenza Virus 2 NOT DETECTED NOT DETECTED Final   Parainfluenza Virus 3 DETECTED (A) NOT DETECTED Final   Parainfluenza Virus 4 NOT DETECTED NOT DETECTED Final   Respiratory Syncytial Virus NOT DETECTED NOT DETECTED Final   Bordetella pertussis NOT DETECTED NOT DETECTED Final   Bordetella Parapertussis NOT DETECTED NOT DETECTED Final   Chlamydophila pneumoniae NOT DETECTED NOT DETECTED Final   Mycoplasma pneumoniae NOT DETECTED NOT DETECTED Final    Comment: Performed at [facility], [address], [place], [place] [address]  Resp panel by RT-PCR (RSV, Flu A&B, Covid) Anterior Nasal Swab     Status: None   Collection Time: 02/10/24  3:17 AM   Specimen: Anterior Nasal Swab  Result Value Ref Range Status   SARS Coronavirus 2 by RT PCR NEGATIVE NEGATIVE Final    Comment: (NOTE) SARS-CoV-2 target nucleic acids are NOT DETECTED.  The SARS-CoV-2 RNA is generally detectable in upper respiratory specimens during the acute phase of infection. The lowest concentration of SARS-CoV-2 viral copies this assay can detect is 138 copies/mL. A negative result does not  preclude SARS-Cov-2 infection and should not be used as the sole basis for treatment or other patient management decisions. A negative result may occur with  improper specimen collection/handling, submission of specimen other than nasopharyngeal swab, presence of viral mutation(s) within the areas targeted by this assay, and inadequate number of viral copies(<138 copies/mL). A negative result must be combined with clinical observations, patient history, and epidemiological information. The expected result is Negative.  Fact Sheet for Patients:  bloggercourse.com  Fact Sheet for Healthcare Providers:  seriousbroker.it  This test is no t yet approved or cleared by the [place]  FDA and  has been authorized for detection and/or diagnosis of SARS-CoV-2 by FDA under an Emergency Use Authorization (EUA). This EUA will remain  in effect (meaning this test can be used) for the duration of the COVID-19 declaration under Section 564(b)(1) of the Act, 21 U.S.C.section 360bbb-3(b)(1), unless the authorization is terminated  or revoked sooner.       Influenza A by PCR NEGATIVE NEGATIVE Final   Influenza B by PCR NEGATIVE NEGATIVE Final    Comment: (NOTE) The Xpert Xpress SARS-CoV-2/FLU/RSV plus assay is intended as an aid in the diagnosis of influenza from Nasopharyngeal swab specimens and should not be used as a sole basis for treatment. Nasal washings and aspirates are unacceptable for Xpert Xpress SARS-CoV-2/FLU/RSV testing.  Fact Sheet for Patients: bloggercourse.com  Fact Sheet for Healthcare Providers: seriousbroker.it  This test is not yet approved or cleared by the [place]  FDA and has been authorized for detection and/or diagnosis of SARS-CoV-2 by FDA under an Emergency Use Authorization (EUA). This EUA will remain in effect (meaning this test can be used) for the  duration of the COVID-19 declaration under Section 564(b)(1) of the Act, 21 U.S.C. section 360bbb-3(b)(1), unless the authorization is terminated or revoked.     Resp Syncytial Virus by PCR NEGATIVE NEGATIVE Final    Comment: (NOTE) Fact Sheet for Patients: bloggercourse.com  Fact Sheet for Healthcare Providers: seriousbroker.it  This test is not yet approved or cleared by the [place]  FDA and has been authorized for detection and/or diagnosis of SARS-CoV-2 by FDA under an Emergency Use Authorization (EUA). This EUA will remain in effect (meaning this test can be used) for the duration  of the COVID-19 declaration under Section 564(b)(1) of the Act, 21 U.S.C. section 360bbb-3(b)(1), unless the authorization is terminated or revoked.  Performed at [facility], [address] [place], [place] [address]   Culture, Respiratory w Gram Stain     Status: None   Collection Time: 02/10/24  7:56 AM   Specimen: Tracheal Aspirate; Respiratory  Result Value Ref Range Status   Specimen Description   Final    TRACHEAL ASPIRATE Performed at [facility], [address] [place], [place] [address]    Special Requests   Final    NONE Performed at [facility], [address] [place], [place] [address]    Gram Stain   Final    FEW WBC PRESENT,BOTH PMN AND MONONUCLEAR NO ORGANISMS SEEN    Culture   Final    NO GROWTH 2 DAYS Performed at [facility], [address] [place], [place] [address]    Report Status 02/12/2024 FINAL  Final  Body fluid culture w Gram Stain     Status: None (Preliminary result)   Collection Time: 02/11/24 12:25 PM   Specimen: Pleura; Body Fluid  Result Value Ref Range Status   Specimen Description   Final    PLEURAL Performed at [facility], [address] [place], [place] [address]    Special Requests   Final    NONE Performed at [facility],  [address] [place], [place] [address]    Gram Stain   Final    WBC PRESENT, PREDOMINANTLY MONONUCLEAR NO ORGANISMS SEEN    Culture   Final    NO GROWTH 2 DAYS Performed at [facility], [address] [place], [place] [address]    Report Status PENDING  Incomplete    Medications: Scheduled Meds:  apixaban   5 mg Oral BID   vitamin C   500 mg Oral BID   carvedilol   3.125 mg Oral BID WC   Chlorhexidine  Gluconate Cloth  6 each Topical Daily   doxylamine  (Sleep)  50 mg Oral QHS   feeding supplement  237 mL Oral BID BM   gabapentin   100 mg Oral BID   insulin  aspart  0-15 Units Subcutaneous TID AC & HS   insulin  glargine-yfgn  8 Units Subcutaneous Daily   ipratropium-albuterol   3 mL Nebulization BID   lidocaine   1 patch Transdermal Q24H   multivitamin with minerals  1 tablet Oral Daily   sodium bicarbonate   50 mEq Intravenous Once   sodium chloride  flush  3 mL Intravenous Q12H   sodium chloride  HYPERTONIC  4 mL Nebulization BID   Continuous Infusions:  ampicillin -sulbactam (UNASYN ) IV 3 g (02/13/24 0024)   PRN Meds:.acetaminophen , benzonatate , hydrALAZINE , HYDROcodone  bit-homatropine, hydrocortisone  cream, ipratropium-albuterol , menthol , mouth rinse, mouth rinse, oxyCODONE , polyethylene glycol, prochlorperazine , promethazine   Allergies  Allergen Reactions   Doxazosin Other (See Comments)    Cardura - cough   Doxycycline      Abdominal pain   Drug Class [Clindamycin/Lincomycin] Hives   Hydralazine  Hcl Other (See Comments)    gout   Metformin  And Related Swelling   Ciprofloxacin Other (See Comments)    Numbness in face   Iodine Other (See Comments)    NOT CT CONTRAST PER PT   Pioglitazone Other (See Comments)   Sacubitril-Valsartan Cough    Pt states that it gave her a cough and chest palpitations   Semaglutide Nausea Only   Sulfamethoxazole-Trimethoprim Hives   Augmentin [Amoxicillin-Pot Clavulanate] Diarrhea   Dulaglutide  Nausea Only   Hctz  [Hydrochlorothiazide] Other (See Comments)    Gout   Losartan Diarrhea   Poison Oak Extract Rash   Red Dye #40 (Allura Red) Rash    Urinalysis: No results for input(s): COLORURINE, LABSPEC, PHURINE, GLUCOSEU, HGBUR, BILIRUBINUR, KETONESUR, PROTEINUR, UROBILINOGEN, NITRITE, LEUKOCYTESUR in the last 72 hours.  Invalid input(s): APPERANCEUR    Imaging: DG Chest 1 View Result Date: 02/13/2024 EXAM: 1 VIEW(S) XRAY OF THE CHEST 02/13/2024 06:27:00 AM COMPARISON: 02/12/2024 CLINICAL HISTORY: 73 year old female with shortness of breath. FINDINGS: LINES, TUBES AND DEVICES: Left chest cardiac pacing device noted. LUNGS AND PLEURA: Increased small right pleural effusion. Increased trace pleural fluid in the right minor fissure. Increased right perihilar interstitial opacities. Vague increased perihilar opacity. No pneumothorax. HEART AND MEDIASTINUM: Cardiomegaly, unchanged. Aortic arch atherosclerotic calcifications. BONES AND SOFT TISSUES: No acute osseous abnormality. IMPRESSION: 1. Mild progression of pulmonary edema. 2. Small right pleural effusion mildly increased. Electronically signed by: Helayne Hurst MD 02/13/2024 06:37 AM EST RP Workstation: [id number]   DG Chest Port 1 View Result Date: 02/12/2024 EXAM: 1 VIEW(S) XRAY OF THE CHEST 02/12/2024 06:20:59 AM COMPARISON: 02/11/2024 CLINICAL HISTORY: Acute respiratory failure with hypoxia (HCC) [id number] FINDINGS: LINES, TUBES AND DEVICES: Left cardiac device stable in place. LUNGS AND PLEURA: No focal pulmonary opacity. Mild pulmonary edema. Mild bilateral interstitial prominence. Trace right pleural effusion. No pneumothorax. HEART AND MEDIASTINUM: Cardiomegaly. Aortic atherosclerosis. BONES AND SOFT TISSUES: No acute osseous abnormality. IMPRESSION: 1. Mild pulmonary edema with mild bilateral interstitial prominence. 2. Trace right pleural effusion. 3. Cardiomegaly. Electronically signed by: Waddell Calk MD 02/12/2024 08:44 AM  EST RP Workstation: [id number]   DG Chest Port 1 View Result Date: 02/11/2024 CLINICAL DATA:  Status post right-sided thoracentesis. EXAM: PORTABLE CHEST 1 VIEW COMPARISON:  02/11/2024. FINDINGS: Low lung volumes. Stable cardiomediastinal contours. Decreased size of a small right pleural effusion with suspected trace residual right pleural effusion. No pneumothorax. Similar mild interstitial edema. Stable left subclavian dual lead pacer. No acute osseous abnormality. IMPRESSION: 1. Decreased size of a small right pleural effusion with suspected trace residual right pleural effusion. No pneumothorax. 2. Similar mild interstitial edema. Electronically Signed   By: Harrietta Sherry M.D.   On: 02/11/2024 13:21      Assessment/Plan:  AURIE HARROUN is a 73 y.o. female with medical problems of   PAF, PE on Xarelto , chronic HFrEF LVEF 25% with recurrent right-sided pleural effusion, PPM-ICD, HTN, IDDM, gout was admitted on 02/09/2024 for :  Cardiac arrest (HCC) [I46.9] CAP (community acquired pneumonia) [J18.9] Dyspnea, unspecified type [R06.00] Community acquired pneumonia, unspecified laterality [J18.9]  Complicated by PEA cardiac arrest, pulmonary embolism, large right pleural effusion, parainfluenza virus 3  AKI Acute kidney injury, oliguric, likely due to to ATN secondary to hypotension, IV contrast exposure, possible pneumonia. Currently managed with IV Unasyn . Plan: Renal US  is psnding Patient is oliguric which is concerning.  However volume status is acceptable at this time. She is currently requiring 1 L oxygen supplementation by nasal cannula. Agree with IV sodium bicarbonate  temporarily to manage acute metabolic acidosis. Will monitor electrolytes and volume status closely on a daily basis. No acute indication for dialysis at present.  Hyperkalemia Start Veltassa  16.8 g daily  Acute metabolic acidosis Likely secondary to renal insufficiency. IV sodium bicarbonate  x 1 for  now. Will reevaluate tomorrow for bicarb supplementation.  Hyponatremia Likely secondary to AKI. Will monitor closely.  Chronic kidney disease, stage IIIb.  Baseline creatinine 1.4/GFR 38 from 02/10/2024.  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 Will need outpatient follow-up.  Comorbidities Chronic systolic CHF-2D echo from 02/10/2024 show LVEF 30 to 35%, global hypokinesis, moderately reduced right ventricular systolic function  Acute pulmonary embolism-patient is currently on apixaban  5 mg twice a day  Right pleural effusion- Pulmonary and cardiac teams are following    Roslind Michaux Dennise 02/13/24

## 2024-02-14 ENCOUNTER — Inpatient Hospital Stay

## 2024-02-14 DIAGNOSIS — L8990 Pressure ulcer of unspecified site, unspecified stage: Secondary | ICD-10-CM | POA: Insufficient documentation

## 2024-02-14 DIAGNOSIS — I469 Cardiac arrest, cause unspecified: Secondary | ICD-10-CM | POA: Diagnosis not present

## 2024-02-14 DIAGNOSIS — N179 Acute kidney failure, unspecified: Secondary | ICD-10-CM | POA: Diagnosis not present

## 2024-02-14 DIAGNOSIS — I509 Heart failure, unspecified: Secondary | ICD-10-CM | POA: Diagnosis not present

## 2024-02-14 DIAGNOSIS — J189 Pneumonia, unspecified organism: Secondary | ICD-10-CM | POA: Diagnosis not present

## 2024-02-14 LAB — CBC
HCT: 37.4 % (ref 36.0–46.0)
Hemoglobin: 12.5 g/dL (ref 12.0–15.0)
MCH: 28.5 pg (ref 26.0–34.0)
MCHC: 33.4 g/dL (ref 30.0–36.0)
MCV: 85.2 fL (ref 80.0–100.0)
Platelets: 163 K/uL (ref 150–400)
RBC: 4.39 MIL/uL (ref 3.87–5.11)
RDW: 16.5 % — ABNORMAL HIGH (ref 11.5–15.5)
WBC: 7.1 K/uL (ref 4.0–10.5)
nRBC: 0.3 % — ABNORMAL HIGH (ref 0.0–0.2)

## 2024-02-14 LAB — CULTURE, BLOOD (ROUTINE X 2)
Culture: NO GROWTH
Culture: NO GROWTH

## 2024-02-14 LAB — BASIC METABOLIC PANEL WITH GFR
Anion gap: 19 — ABNORMAL HIGH (ref 5–15)
BUN: 88 mg/dL — ABNORMAL HIGH (ref 8–23)
CO2: 20 mmol/L — ABNORMAL LOW (ref 22–32)
Calcium: 8.7 mg/dL — ABNORMAL LOW (ref 8.9–10.3)
Chloride: 91 mmol/L — ABNORMAL LOW (ref 98–111)
Creatinine, Ser: 4.31 mg/dL — ABNORMAL HIGH (ref 0.44–1.00)
GFR, Estimated: 10 mL/min — ABNORMAL LOW (ref >60)
Glucose, Bld: 66 mg/dL — ABNORMAL LOW (ref 70–99)
Potassium: 5 mmol/L (ref 3.5–5.1)
Sodium: 129 mmol/L — ABNORMAL LOW (ref 135–145)

## 2024-02-14 LAB — GLUCOSE, CAPILLARY
Glucose-Capillary: 74 mg/dL (ref 70–99)
Glucose-Capillary: 91 mg/dL (ref 70–99)
Glucose-Capillary: 76 mg/dL (ref 70–99)
Glucose-Capillary: 175 mg/dL — ABNORMAL HIGH (ref 70–99)

## 2024-02-14 LAB — HEPATITIS B SURFACE ANTIGEN: Hepatitis B Surface Ag: NONREACTIVE

## 2024-02-14 MED ORDER — IPRATROPIUM-ALBUTEROL 0.5-2.5 (3) MG/3ML IN SOLN: 3.0000 mL | Freq: Four times a day (QID) | RESPIRATORY_TRACT | Status: DC

## 2024-02-14 MED ORDER — PENTAFLUOROPROP-TETRAFLUOROETH EX AERO: 1.0000 | INHALATION_SPRAY | CUTANEOUS | Status: DC | PRN

## 2024-02-14 MED ORDER — SODIUM CHLORIDE 0.9% IV SOLUTION: Freq: Once | INTRAVENOUS | Status: DC

## 2024-02-14 MED ORDER — ANTICOAGULANT SODIUM CITRATE 4% (200MG/5ML) IV SOLN: 5.0000 mL | Status: DC | PRN

## 2024-02-14 MED ORDER — HEPARIN SODIUM (PORCINE) 1000 UNIT/ML DIALYSIS
1000.0000 [IU] | INTRAMUSCULAR | Status: DC | PRN
Start: 2024-02-15 — End: 2024-02-14

## 2024-02-14 MED ORDER — ALTEPLASE 2 MG IJ SOLR: 2.0000 mg | Freq: Once | INTRAMUSCULAR | Status: DC | PRN

## 2024-02-14 MED ORDER — HEPARIN SODIUM (PORCINE) 1000 UNIT/ML DIALYSIS: 1000.0000 [IU] | INTRAMUSCULAR | Status: DC | PRN

## 2024-02-14 MED ORDER — SODIUM BICARBONATE 8.4 % IV SOLN
50.0000 meq | Freq: Three times a day (TID) | INTRAVENOUS | Status: DC
  Administered 2024-02-14: 50 meq via INTRAVENOUS
  Filled 2024-02-14: qty 50

## 2024-02-14 MED ORDER — IPRATROPIUM-ALBUTEROL 0.5-2.5 (3) MG/3ML IN SOLN
3.0000 mL | Freq: Three times a day (TID) | RESPIRATORY_TRACT | Status: DC
  Administered 2024-02-14 – 2024-02-15 (×3): 3 mL via RESPIRATORY_TRACT
  Filled 2024-02-14 (×3): qty 3

## 2024-02-14 MED ORDER — LIDOCAINE HCL (PF) 1 % IJ SOLN: 5.0000 mL | INTRAMUSCULAR | Status: DC | PRN

## 2024-02-14 NOTE — Progress Notes (Signed)
 SUBJECTIVE: Patient is still very short of breath.   Vitals:   02/14/24 1000 02/14/24 1100 02/14/24 1200 02/14/24 1246  BP: 128/86 135/82 (!) 126/100   Pulse: 98 95 87 99  Resp: 20 18 18  (!) 24  Temp:      TempSrc:      SpO2: 100% 98% 100% 100%  Weight:      Height:        Intake/Output Summary (Last 24 hours) at 02/14/2024 1254 Last data filed at 02/14/2024 0959 Gross per 24 hour  Intake 243 ml  Output 350 ml  Net -107 ml    LABS: Basic Metabolic Panel: Recent Labs    02/12/24 0601 02/12/24 2346 02/13/24 0325 02/13/24 2018 02/14/24 0429  NA 130* 126*   < > 130* 129*  K 4.7 5.8*   < > 5.0 5.0  CL 94* 92*   < > 92* 91*  CO2 20* 17*   < > 19* 20*  GLUCOSE 74 137*   < > 87 66*  BUN 68* 79*   < > 80* 88*  CREATININE 2.95* 3.68*   < > 4.15* 4.31*  CALCIUM  8.6* 8.7*   < > 8.5* 8.7*  MG 2.3 2.6*  --   --   --   PHOS 4.8*  --   --   --   --    < > = values in this interval not displayed.   Liver Function Tests: No results for input(s): AST, ALT, ALKPHOS, BILITOT, PROT, ALBUMIN in the last 72 hours. No results for input(s): LIPASE, AMYLASE in the last 72 hours. CBC: Recent Labs    02/12/24 0601 02/13/24 0630  WBC 10.0 9.3  HGB 13.6 13.2  HCT 41.1 40.2  MCV 86.5 86.8  PLT 155 172   Cardiac Enzymes: No results for input(s): CKTOTAL, CKMB, CKMBINDEX, TROPONINI in the last 72 hours. BNP: Invalid input(s): POCBNP D-Dimer: No results for input(s): DDIMER in the last 72 hours. Hemoglobin A1C: No results for input(s): HGBA1C in the last 72 hours. Fasting Lipid Panel: No results for input(s): CHOL, HDL, LDLCALC, TRIG, CHOLHDL, LDLDIRECT in the last 72 hours. Thyroid Function Tests: No results for input(s): TSH, T4TOTAL, T3FREE, THYROIDAB in the last 72 hours.  Invalid input(s): FREET3 Anemia Panel: No results for input(s): VITAMINB12, FOLATE, FERRITIN, TIBC, IRON, RETICCTPCT in the last 72  hours.   PHYSICAL EXAM General: Well developed, well nourished, in no acute distress HEENT:  Normocephalic and atramatic Neck:  No JVD.  Lungs: Clear bilaterally to auscultation and percussion. Heart: HRRR . Normal S1 and S2 without gallops or murmurs.  Abdomen: Bowel sounds are positive, abdomen soft and non-tender  Msk:  Back normal, normal gait. Normal strength and tone for age. Extremities: No clubbing, cyanosis or edema.   Neuro: Alert and oriented X 3. Psych:  Good affect, responds appropriately  TELEMETRY: Atrial fibrillation with ventricular rate 80  ASSESSMENT AND PLAN:    ICD-10-CM   1. Dyspnea, unspecified type  R06.00     2. Community acquired pneumonia, unspecified laterality  J18.9       Deborah Dawson is a 73 y.o. female  with a past medical history of chronic HFrEF, s/p CRT-P 07/2021, persistent atrial fibrillation, hypertension, type 2 diabetes who presented to the ED on 02/09/2024 for shortness of breath. CTA chest concerning for PNA. Overnight had PEA arrest x2 with ROSC. Cardiology was consulted for further evaluation.    # PEA arrest # Pneumonia # Acute on chronic  HFrEF # S/p CRT-P 07/2021 # Persistent atrial fibrillation Patient initially presented with shortness of breath, concern for pneumonia on CTA chest.  Overnight last night she had PEA arrest with ROSC then shortly afterwards arrested again.  Intubated in the ED.  Troponins mildly elevated and flat trending.  BNP significantly elevated at 15,900. EKG without acute ischemic changes.  Echo this admission with EF 30-35%, global hypokinesis, moderately reduced RV function with mild enlargement and mild RV thicknes. -Continue IV Lasix  60 mg twice daily. -GDMT , added Farxiga  and currently stopped still on Coreg  6.25 twice daily.  Apparently she is allergic to losartan and Entresto. -Mildly elevated and flat troponins most consistent with demand/supply mismatch and not ACS.  -Further management per PCCM.      Denyse Bathe, MD, FACC 02/14/2024 12:54 PM

## 2024-02-14 NOTE — Progress Notes (Addendum)
 Patient working to breathe. Complaining of sob, she received her morning breathing txt. Per Patient does not feel like its helping. SATs 95 on 2L n/c. Dr. Awanda notified, per MD will order chest xray.

## 2024-02-14 NOTE — Plan of Care (Signed)
  Problem: Education: Goal: Ability to describe self-care measures that may prevent or decrease complications (Diabetes Survival Skills Education) will improve Outcome: Progressing   Problem: Coping: Goal: Ability to adjust to condition or change in health will improve Outcome: Progressing   Problem: Skin Integrity: Goal: Risk for impaired skin integrity will decrease Outcome: Progressing   Problem: Activity: Goal: Risk for activity intolerance will decrease Outcome: Progressing   Problem: Nutritional: Goal: Maintenance of adequate nutrition will improve Outcome: Not Progressing

## 2024-02-14 NOTE — Progress Notes (Signed)
 [facility] [place], [place] 02/14/24  Subjective:   Hospital day # 5  Patient reports worsening shortness of breath today.  Currently requiring oxygen supplementation by nasal cannula.  However feels tightness in her chest.  Urine output unfortunately remains low at 285 cc from yesterday.  Serum creatinine has worsened to 4.31 today and BUN has increased to 88. Chest x-ray shows increased pleural effusion and pulmonary edema confirmed with physical exam.   Objective:  Vital signs in last 24 hours:  Temp:  [97.4 F (36.3 C)-97.7 F (36.5 C)] 97.7 F (36.5 C) (11/23 1327) Pulse Rate:  [69-99] 85 (11/23 1430) Resp:  [14-31] 21 (11/23 1430) BP: (102-154)/(70-110) 154/87 (11/23 1430) SpO2:  [92 %-100 %] 100 % (11/23 1430) Weight:  [73.8 kg] 73.8 kg (11/23 1335)  Weight change: 2.9 kg Filed Weights   02/13/24 0500 02/14/24 0500 02/14/24 1335  Weight: 70.9 kg 73.8 kg 73.8 kg    Intake/Output:    Intake/Output Summary (Last 24 hours) at 02/14/2024 1444 Last data filed at 02/14/2024 9040 Gross per 24 hour  Intake 143 ml  Output 350 ml  Net -207 ml     Physical Exam: General: Ill-appearing, laying in the bed  HEENT Moist oral mucous membranes  Pulm/lungs Diffuse bilateral crackles, decreased breath sounds at right base  CVS/Heart Irregular, paced  Abdomen:  Soft, nontender, nondistended  Extremities: Trace edema  Neurologic: Alert, able to answer questions appropriately  Skin: Warm, dry  Access: To be placed       Basic Metabolic Panel:  Recent Labs  Lab 02/10/24 0213 02/11/24 0447 02/11/24 0500 02/11/24 1016 02/12/24 0601 02/12/24 2346 02/13/24 0325 02/13/24 1116 02/13/24 2018 02/14/24 0429  NA 134* 137  --   --  130* 126* 128* 130* 130* 129*  K 4.4 4.7  --  4.3 4.7 5.8* 5.4* 4.8 5.0 5.0  CL 102 100  --   --  94* 92* 90* 93* 92* 91*  CO2 21* 23  --   --  20* 17* 18* 20* 19* 20*  GLUCOSE 270* 152*  --   --  74 137* 118* 117* 87 66*   BUN 34* 40*  --   --  68* 79* 73* 84* 80* 88*  CREATININE 1.44* 1.92*  --   --  2.95* 3.68* 3.83* 3.99* 4.15* 4.31*  CALCIUM  9.2 8.9  --   --  8.6* 8.7* 8.8* 8.7* 8.5* 8.7*  MG 2.7*  --  2.1 2.1 2.3 2.6*  --   --   --   --   PHOS 4.0 4.7*  --   --  4.8*  --   --   --   --   --      CBC: Recent Labs  Lab 02/10/24 0213 02/11/24 0500 02/12/24 0601 02/13/24 0630 02/14/24 1415  WBC 16.3* 11.1* 10.0 9.3 7.1  HGB 13.7 14.2 13.6 13.2 12.5  HCT 42.9 42.3 41.1 40.2 37.4  MCV 90.1 85.1 86.5 86.8 85.2  PLT 216 192 155 172 163      Lab Results  Component Value Date   HEPBSAG NON REACTIVE 02/14/2024      Microbiology:  Recent Results (from the past 240 hours)  Blood culture (routine x 2)     Status: None   Collection Time: 02/09/24  9:27 PM   Specimen: BLOOD  Result Value Ref Range Status   Specimen Description BLOOD BLOOD RIGHT ARM  Final   Special Requests   Final  BOTTLES DRAWN AEROBIC AND ANAEROBIC Blood Culture results may not be optimal due to an inadequate volume of blood received in culture bottles   Culture   Final    NO GROWTH 5 DAYS Performed at [facility], [address] [place], [place] [address]    Report Status 02/14/2024 FINAL  Final  Blood culture (routine x 2)     Status: None   Collection Time: 02/09/24  9:27 PM   Specimen: BLOOD  Result Value Ref Range Status   Specimen Description BLOOD BLOOD LEFT ARM  Final   Special Requests   Final    BOTTLES DRAWN AEROBIC AND ANAEROBIC Blood Culture results may not be optimal due to an inadequate volume of blood received in culture bottles   Culture   Final    NO GROWTH 5 DAYS Performed at [facility], [address] [place], [place] [address]    Report Status 02/14/2024 FINAL  Final  MRSA Next Gen by PCR, Nasal     Status: None   Collection Time: 02/10/24  1:49 AM   Specimen: Nasal Mucosa; Nasal Swab  Result Value Ref Range Status   MRSA by PCR Next Gen NOT DETECTED NOT DETECTED  Final    Comment: (NOTE) The GeneXpert MRSA Assay (FDA approved for NASAL specimens only), is one component of a comprehensive MRSA colonization surveillance program. It is not intended to diagnose MRSA infection nor to guide or monitor treatment for MRSA infections. Test performance is not FDA approved in patients less than 24 years old. Performed at [facility], [address] [place], [place] [address]   Respiratory (~20 pathogens) panel by PCR     Status: Abnormal   Collection Time: 02/10/24  1:49 AM   Specimen: Nasopharyngeal Swab; Respiratory  Result Value Ref Range Status   Adenovirus NOT DETECTED NOT DETECTED Final   Coronavirus 229E NOT DETECTED NOT DETECTED Final    Comment: (NOTE) The Coronavirus on the Respiratory Panel, DOES NOT test for the novel  Coronavirus (2019 nCoV)    Coronavirus HKU1 NOT DETECTED NOT DETECTED Final   Coronavirus NL63 NOT DETECTED NOT DETECTED Final   Coronavirus OC43 NOT DETECTED NOT DETECTED Final   Metapneumovirus NOT DETECTED NOT DETECTED Final   Rhinovirus / Enterovirus NOT DETECTED NOT DETECTED Final   Influenza A NOT DETECTED NOT DETECTED Final   Influenza B NOT DETECTED NOT DETECTED Final   Parainfluenza Virus 1 NOT DETECTED NOT DETECTED Final   Parainfluenza Virus 2 NOT DETECTED NOT DETECTED Final   Parainfluenza Virus 3 DETECTED (A) NOT DETECTED Final   Parainfluenza Virus 4 NOT DETECTED NOT DETECTED Final   Respiratory Syncytial Virus NOT DETECTED NOT DETECTED Final   Bordetella pertussis NOT DETECTED NOT DETECTED Final   Bordetella Parapertussis NOT DETECTED NOT DETECTED Final   Chlamydophila pneumoniae NOT DETECTED NOT DETECTED Final   Mycoplasma pneumoniae NOT DETECTED NOT DETECTED Final    Comment: Performed at [facility], [address], [place], [place] [address]  Resp panel by RT-PCR (RSV, Flu A&B, Covid) Anterior Nasal Swab     Status: None   Collection Time: 02/10/24  3:17 AM   Specimen: Anterior Nasal  Swab  Result Value Ref Range Status   SARS Coronavirus 2 by RT PCR NEGATIVE NEGATIVE Final    Comment: (NOTE) SARS-CoV-2 target nucleic acids are NOT DETECTED.  The SARS-CoV-2 RNA is generally detectable in upper respiratory specimens during the acute phase of infection. The lowest concentration of  SARS-CoV-2 viral copies this assay can detect is 138 copies/mL. A negative result does not preclude SARS-Cov-2 infection and should not be used as the sole basis for treatment or other patient management decisions. A negative result may occur with  improper specimen collection/handling, submission of specimen other than nasopharyngeal swab, presence of viral mutation(s) within the areas targeted by this assay, and inadequate number of viral copies(<138 copies/mL). A negative result must be combined with clinical observations, patient history, and epidemiological information. The expected result is Negative.  Fact Sheet for Patients:  bloggercourse.com  Fact Sheet for Healthcare Providers:  seriousbroker.it  This test is no t yet approved or cleared by the [place]  FDA and  has been authorized for detection and/or diagnosis of SARS-CoV-2 by FDA under an Emergency Use Authorization (EUA). This EUA will remain  in effect (meaning this test can be used) for the duration of the COVID-19 declaration under Section 564(b)(1) of the Act, 21 U.S.C.section 360bbb-3(b)(1), unless the authorization is terminated  or revoked sooner.       Influenza A by PCR NEGATIVE NEGATIVE Final   Influenza B by PCR NEGATIVE NEGATIVE Final    Comment: (NOTE) The Xpert Xpress SARS-CoV-2/FLU/RSV plus assay is intended as an aid in the diagnosis of influenza from Nasopharyngeal swab specimens and should not be used as a sole basis for treatment. Nasal washings and aspirates are unacceptable for Xpert Xpress SARS-CoV-2/FLU/RSV testing.  Fact Sheet for  Patients: bloggercourse.com  Fact Sheet for Healthcare Providers: seriousbroker.it  This test is not yet approved or cleared by the [place]  FDA and has been authorized for detection and/or diagnosis of SARS-CoV-2 by FDA under an Emergency Use Authorization (EUA). This EUA will remain in effect (meaning this test can be used) for the duration of the COVID-19 declaration under Section 564(b)(1) of the Act, 21 U.S.C. section 360bbb-3(b)(1), unless the authorization is terminated or revoked.     Resp Syncytial Virus by PCR NEGATIVE NEGATIVE Final    Comment: (NOTE) Fact Sheet for Patients: bloggercourse.com  Fact Sheet for Healthcare Providers: seriousbroker.it  This test is not yet approved or cleared by the [place]  FDA and has been authorized for detection and/or diagnosis of SARS-CoV-2 by FDA under an Emergency Use Authorization (EUA). This EUA will remain in effect (meaning this test can be used) for the duration of the COVID-19 declaration under Section 564(b)(1) of the Act, 21 U.S.C. section 360bbb-3(b)(1), unless the authorization is terminated or revoked.  Performed at [facility], [address] [place], [place] [address]   Culture, Respiratory w Gram Stain     Status: None   Collection Time: 02/10/24  7:56 AM   Specimen: Tracheal Aspirate; Respiratory  Result Value Ref Range Status   Specimen Description   Final    TRACHEAL ASPIRATE Performed at [facility], [address] [place], [place] [address]    Special Requests   Final    NONE Performed at [facility], [address] [place], [place] [address]    Gram Stain   Final    FEW WBC PRESENT,BOTH PMN AND MONONUCLEAR NO ORGANISMS SEEN    Culture   Final    NO GROWTH 2 DAYS Performed at [facility], [address], [place], [place] [address]    Report Status  02/12/2024 FINAL  Final  Body fluid culture w Gram Stain     Status: None (Preliminary result)   Collection Time: 02/11/24 12:25 PM   Specimen: Pleura;  Body Fluid  Result Value Ref Range Status   Specimen Description   Final    PLEURAL Performed at [facility] [place], [address] [place], [place] [address]    Special Requests   Final    NONE Performed at [facility], [address] [place], [place] [address]    Gram Stain   Final    WBC PRESENT, PREDOMINANTLY MONONUCLEAR NO ORGANISMS SEEN    Culture   Final    NO GROWTH 3 DAYS Performed at [facility], [address] [place], [place] [address]    Report Status PENDING  Incomplete    Coagulation Studies: No results for input(s): LABPROT, INR in the last 72 hours.  Urinalysis: No results for input(s): COLORURINE, LABSPEC, PHURINE, GLUCOSEU, HGBUR, BILIRUBINUR, KETONESUR, PROTEINUR, UROBILINOGEN, NITRITE, LEUKOCYTESUR in the last 72 hours.  Invalid input(s): APPERANCEUR    Imaging: DG Chest Port 1 View Result Date: 02/14/2024 CLINICAL DATA:  [id number] Dyspnea [id number] EXAM: PORTABLE CHEST 1 VIEW COMPARISON:  February 13, 2024, January 25, 2024 FINDINGS: The cardiomediastinal silhouette is unchanged and enlarged in contour.Increased conspicuity of density along the RIGHT diaphragmatic border favored to reflect an increasing pleural effusion with associated atelectasis. This appears similar compared to November 3rd radiograph. No pneumothorax. Mild interstitial prominence. LEFT chest cardiac pacing device. Atherosclerotic calcifications. IMPRESSION: 1. Increased conspicuity of density along the RIGHT diaphragmatic border favored to reflect an increasing pleural effusion with atelectasis. This appears similar compared to November 3rd radiograph. 2. Mild interstitial prominence may reflect a degree of underlying pulmonary edema. Electronically Signed   By: Corean Salter M.D.   On:  02/14/2024 11:36   US  RENAL Result Date: 02/13/2024 CLINICAL DATA:  [id number] Decreased urine output [id number] EXAM: RENAL / URINARY TRACT ULTRASOUND COMPLETE COMPARISON:  February 10, 2024, January 05, 2023 FINDINGS: Right Kidney: Renal measurements: 12.4 x 4.7 by 4.3 cm = volume: 132 mL. Echogenicity within normal limits. No mass or hydronephrosis visualized. Left Kidney: Renal measurements: 10.7 x 4.2 x 5.4 cm = volume: 125 mL. Echogenicity within normal limits. No mass or hydronephrosis visualized. Echogenic focus in the region of the renal pelvis measuring 12 mm may reflect a nephrolithiasis or vascular calcification. Bladder: Not visualized secondary to shadowing bowel gas. Other: None. IMPRESSION: 1. No hydronephrosis. 2. Echogenic focus in the region of the left renal pelvis may reflect a nephrolithiasis or vascular calcification. Electronically Signed   By: Corean Salter M.D.   On: 02/13/2024 13:18   DG Chest 1 View Result Date: 02/13/2024 EXAM: 1 VIEW(S) XRAY OF THE CHEST 02/13/2024 06:27:00 AM COMPARISON: 02/12/2024 CLINICAL HISTORY: 73 year old female with shortness of breath. FINDINGS: LINES, TUBES AND DEVICES: Left chest cardiac pacing device noted. LUNGS AND PLEURA: Increased small right pleural effusion. Increased trace pleural fluid in the right minor fissure. Increased right perihilar interstitial opacities. Vague increased perihilar opacity. No pneumothorax. HEART AND MEDIASTINUM: Cardiomegaly, unchanged. Aortic arch atherosclerotic calcifications. BONES AND SOFT TISSUES: No acute osseous abnormality. IMPRESSION: 1. Mild progression of pulmonary edema. 2. Small right pleural effusion mildly increased. Electronically signed by: Helayne Hurst MD 02/13/2024 06:37 AM EST RP Workstation: [id number]     Medications:    ampicillin -sulbactam (UNASYN ) IV 3 g (02/14/24 1259)    sodium chloride    Intravenous Once   apixaban   5 mg Oral BID   vitamin C   500 mg Oral BID   carvedilol   3.125 mg  Oral BID WC   Chlorhexidine  Gluconate Cloth  6 each Topical  Daily   feeding supplement  237 mL Oral BID BM   gabapentin   100 mg Oral BID   insulin  aspart  0-6 Units Subcutaneous TID WC   ipratropium-albuterol   3 mL Nebulization TID   lidocaine   1 patch Transdermal Q24H   multivitamin with minerals  1 tablet Oral Daily   patiromer   16.8 g Oral Daily   sodium bicarbonate   50 mEq Intravenous TID   sodium chloride  flush  3 mL Intravenous Q12H   sodium chloride  HYPERTONIC  4 mL Nebulization BID   acetaminophen , benzonatate , hydrALAZINE , HYDROcodone  bit-homatropine, hydrocortisone  cream, ipratropium-albuterol , menthol , mouth rinse, mouth rinse, oxyCODONE , polyethylene glycol, prochlorperazine , promethazine   Assessment/ Plan:  73 y.o. 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 Pneumonia is currently managed with IV Unasyn . Plan: Renal US  negative for hydronephrosis. Patient is oliguric which is concerning.   Volume status, BUN, creatinine has worsened since yesterday.  It is anticipated that renal recovery will be somewhat delayed due to underlying comorbidities.  Discussed this with primary team as well as patient.  Recommend starting intermittent hemodialysis for volume and electrolyte optimization. Patient's blood pressure is soft.  Will limit UF to 1-1.5 L as tolerated.. ICU team has kindly assisted to place the line.    Hyperkalemia Improved  after [place] . Expected to improve with hemodialysis.   Acute metabolic acidosis Likely secondary to renal insufficiency. No further need of sodium bicarbonate .  Will be managed with hemodialysis.   Hyponatremia Likely secondary to AKI. Will monitor closely.   Chronic kidney disease, stage IIIb.  Baseline creatinine 1.4/GFR 38 from 02/10/2024.  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 Will need outpatient follow-up.   Comorbidities Chronic systolic CHF-2D echo from 02/10/2024 show LVEF 30 to 35%, global hypokinesis, moderately reduced right ventricular systolic function   Acute pulmonary embolism-patient is currently on apixaban  5 mg twice a day   Right pleural effusion- Pulmonary and cardiac teams are following      LOS: 5 [facility] Belay [facility] 11/23/20252:44 PM  [facility] [place], [place] [phone]  Note: This note was prepared with Dragon dictation. Any transcription errors are unintentional

## 2024-02-14 NOTE — Procedures (Signed)
 Central Venous Catheter Insertion Procedure Note  Deborah Dawson  [id number]  05/17/1950  Date:02/14/24  Time:12:50 PM   Provider Performing:Shanan Mcmiller   Procedure: Insertion of Non-tunneled Central Venous Catheter(36556)with US  guidance (23062)    Indication(s) Hemodialysis  Consent Risks of the procedure as well as the alternatives and risks of each were explained to the patient and/or caregiver.  Consent for the procedure was obtained and is signed in the bedside chart  Anesthesia Topical only with 1% lidocaine    Timeout Verified patient identification, verified procedure, site/side was marked, verified correct patient position, special equipment/implants available, medications/allergies/relevant history reviewed, required imaging and test results available.  Sterile Technique Maximal sterile technique including full sterile barrier drape, hand hygiene, sterile gown, sterile gloves, mask, hair covering, sterile ultrasound probe cover (if used).  Procedure Description Area of catheter insertion was cleaned with chlorhexidine  and draped in sterile fashion.   With real-time ultrasound guidance a HD catheter was placed into the right femoral vein.  Nonpulsatile blood flow and easy flushing noted in all ports.  The catheter was sutured in place and sterile dressing applied.  Complications/Tolerance None; patient tolerated the procedure well. Chest X-ray is ordered to verify placement for internal jugular or subclavian cannulation.  Chest x-ray is not ordered for femoral cannulation.  EBL Minimal  Specimen(s) None   Robet Kim, PA-C Pulmonary/Critical Care PCCM Team Contact Info: [phone]

## 2024-02-14 NOTE — Progress Notes (Signed)
   02/14/24 1608  Vitals  Temp Source Oral  BP (!) 145/100  MAP (mmHg) 115  BP Location Right Arm  BP Method Automatic  Patient Position (if appropriate) Lying  Pulse Rate 83  Pulse Rate Source Monitor  ECG Heart Rate 85  Resp (!) 22  Oxygen Therapy  SpO2 99 %  O2 Device Nasal Cannula  O2 Flow Rate (L/min) 2 L/min  Patient Activity (if Appropriate) In bed  Pulse Oximetry Type Continuous  Oximetry Probe Site Changed No  During Treatment Monitoring  HD Safety Checks Performed Yes  Intra-Hemodialysis Comments Tx completed  Post Treatment  Dialyzer Clearance Lightly streaked  Hemodialysis Intake (mL) 0 mL  Liters Processed 45  Fluid Removed (mL) 1500 mL  Tolerated HD Treatment Yes  Hemodialysis Catheter Right Femoral vein  Placement Date: 02/14/24   Time Out: Correct patient;Correct procedure;Correct site  Orientation: Right  Access Location: Femoral vein  Site Condition No complications  Blue Lumen Status Flushed;Heparin  locked;Dead end cap in place  Red Lumen Status Flushed;Heparin  locked;Dead end cap in place  Purple Lumen Status N/A  Catheter fill solution Heparin  1000 units/ml  Catheter fill volume (Arterial) 1.4 cc  Catheter fill volume (Venous) 1.4  Dressing Type Transparent  Dressing Status Antimicrobial disc/dressing in place;Clean, Dry, Intact  Interventions New dressing  Drainage Description None  Dressing Change Due 02/15/24  Post treatment catheter status Capped and Clamped   Pt tolerated tx well. Removed 1500 ml. No concerns or complications.

## 2024-02-14 NOTE — Plan of Care (Signed)
  Problem: Education: Goal: Ability to describe self-care measures that may prevent or decrease complications (Diabetes Survival Skills Education) will improve Outcome: Progressing Goal: Individualized Educational Video(s) Outcome: Progressing   Problem: Coping: Goal: Ability to adjust to condition or change in health will improve Outcome: Progressing   Problem: Fluid Volume: Goal: Ability to maintain a balanced intake and output will improve Outcome: Progressing   Problem: Health Behavior/Discharge Planning: Goal: Ability to identify and utilize available resources and services will improve Outcome: Progressing Goal: Ability to manage health-related needs will improve Outcome: Progressing   Problem: Metabolic: Goal: Ability to maintain appropriate glucose levels will improve Outcome: Progressing   Problem: Nutritional: Goal: Maintenance of adequate nutrition will improve Outcome: Progressing Goal: Progress toward achieving an optimal weight will improve Outcome: Progressing   Problem: Skin Integrity: Goal: Risk for impaired skin integrity will decrease Outcome: Progressing   Problem: Tissue Perfusion: Goal: Adequacy of tissue perfusion will improve Outcome: Progressing   Problem: Activity: Goal: Ability to tolerate increased activity will improve Outcome: Progressing   Problem: Respiratory: Goal: Ability to maintain a clear airway and adequate ventilation will improve Outcome: Progressing   Problem: Role Relationship: Goal: Method of communication will improve Outcome: Progressing   Problem: Education: Goal: Knowledge of General Education information will improve Description: Including pain rating scale, medication(s)/side effects and non-pharmacologic comfort measures Outcome: Progressing   Problem: Health Behavior/Discharge Planning: Goal: Ability to manage health-related needs will improve Outcome: Progressing   Problem: Clinical Measurements: Goal:  Ability to maintain clinical measurements within normal limits will improve Outcome: Progressing Goal: Will remain free from infection Outcome: Progressing Goal: Diagnostic test results will improve Outcome: Progressing Goal: Respiratory complications will improve Outcome: Progressing Goal: Cardiovascular complication will be avoided Outcome: Progressing   Problem: Activity: Goal: Risk for activity intolerance will decrease Outcome: Progressing   Problem: Nutrition: Goal: Adequate nutrition will be maintained Outcome: Progressing   Problem: Coping: Goal: Level of anxiety will decrease Outcome: Progressing   Problem: Elimination: Goal: Will not experience complications related to bowel motility Outcome: Progressing Goal: Will not experience complications related to urinary retention Outcome: Progressing   Problem: Pain Managment: Goal: General experience of comfort will improve and/or be controlled Outcome: Progressing   Problem: Safety: Goal: Ability to remain free from injury will improve Outcome: Progressing   Problem: Skin Integrity: Goal: Risk for impaired skin integrity will decrease Outcome: Progressing   Problem: Activity: Goal: Ability to tolerate increased activity will improve Outcome: Progressing   Problem: Clinical Measurements: Goal: Ability to maintain a body temperature in the normal range will improve Outcome: Progressing   Problem: Respiratory: Goal: Ability to maintain adequate ventilation will improve Outcome: Progressing Goal: Ability to maintain a clear airway will improve Outcome: Progressing

## 2024-02-14 NOTE — Progress Notes (Signed)
 PROGRESS NOTE    Deborah Dawson  FMW:[id number] DOB: 1951/03/16 DOA: 02/09/2024 PCP: Deborah Manna, MD  [id number]  LOS: 5 days   Brief hospital course:   Assessment & Plan: Deborah Dawson is a 73 y.o. female with a known history of PAF, PE on Xarelto , chronic HFrEF LVEF 25% with recurrent right-sided pleural effusion, PPM-ICD, HTN, IDDM, gout  presents to the emergency department for evaluation of shortness of breath.     Patient was in a usual state of health until earlier this month from 3 2 through 86 8 she was admitted to the hospital for acute on chronic systolic CHF with right-sided pleural effusion  for which she underwent a thoracentesis, on 11/4 to remove 900 cc and again on 11/7 to remove 700 cc.  At that time she also was found to have a small PE for which she was switched from Eliquis  to Xarelto .  She was discharged home and followed up yesterday [facility] with sent for chest x-ray for worsening shortness of breath that developed about 5 days after leaving the hospital.  She reports that she has a chronic cough which has been productive of clear to yellow sputum in the last day.    While awaiting room placement in ED, patient suddenly became unresponsive.  When pulse checked patient found to be in PEA. She underwent 1 round of CPR and ACLS prior to ROSC, she remained obtunded and was emergently intubated for airway protection. Then about 20 minutes later became pulseless in PEA again receiving 1 round of CPR and ACLS prior to her return of ROSC.  Pt was transferred to [facility] team.  Pt was extubated on 11/20 and transferred back to [facility] on 11/21.  # PEA arrest secondary to --unclear etiology.   --monitor on tele  # Acute hypoxic respiratory failure  --initial CTA chest showed Diffuse bronchial wall thickening with mucous plugging and then a day later showed rapid development of pulm edema and right pleural effusion. --treated with diuresis and thoracentesis --cont  hypertonic saline neb BID with DuoNeb --start HD for fluid removal  Parainfluenza virus 3 infection --cough and congestion --cont hypertonic saline neb BID with DuoNeb --Tessalon  and hycodan PRN --supportive care  # Moderate to large pleural effusion  with mediastinal lymphadenopathy raising concern for an underlying lymphoproliferative disease --s/p thoracentesis  # Acute on chronic Heart failure with reduced EF 20 to 25%  --received IV lasix  --hold further diuresis now due to AKI --cont coreg   AKI  Metabolic acidosis --very reduced urine output --nephro consulted --cont IV Na bicarb --start HD  #. Mild hyperkalemia --due to AKI --cont valtessa daily   #.  History of PAF - Continue carvedilol  --hold anticoagulation for dialysis cath placement   #. DM2 Hypoglycemia --hypoglycemia due to poor oral intake --d/c glargine --SSI very sensitive scale   DVT prophylaxis: SCD/Compression stockings Code Status: Full code  Family Communication:  Level of care: Stepdown Dispo:   The patient is from: home Anticipated d/c is to: SNF rehab Anticipated d/c date is: > 3 days   Subjective and Interval History:  Pt reported more dyspnea.  Urine output still poor.  Plan to start dialysis.   Objective: Vitals:   02/14/24 1600 02/14/24 1608 02/14/24 1615 02/14/24 1630  BP: (!) 145/89 (!) 145/100 (!) 145/100 (!) 141/84  Pulse: 91 83 87 90  Resp: (!) 21 (!) 22 (!) 23 19  Temp: 98.1 F (36.7 C)     TempSrc: Oral Oral  SpO2: 98% 99% 100% 99%  Weight:      Height:        Intake/Output Summary (Last 24 hours) at 02/14/2024 1658 Last data filed at 02/14/2024 1608 Gross per 24 hour  Intake 103 ml  Output 1850 ml  Net -1747 ml   Filed Weights   02/13/24 0500 02/14/24 0500 02/14/24 1335  Weight: 70.9 kg 73.8 kg 73.8 kg    Examination:   Constitutional: NAD, AAOx3 HEENT: conjunctivae and lids normal, EOMI CV: No cyanosis.   RESP: normal respiratory effort Neuro:  II - XII grossly intact.   Psych: Normal mood and affect.  Appropriate judgement and reason   Data Reviewed: I have personally reviewed labs and imaging studies  Time spent: 50 minutes  [name], MD [facility] If 7PM-7AM, please contact night-coverage 02/14/2024, 4:58 PM

## 2024-02-15 DIAGNOSIS — N179 Acute kidney failure, unspecified: Secondary | ICD-10-CM | POA: Diagnosis not present

## 2024-02-15 LAB — CYTOLOGY - NON PAP

## 2024-02-15 LAB — HEPATITIS B SURFACE ANTIBODY, QUANTITATIVE: Hep B S AB Quant (Post): 135 m[IU]/mL

## 2024-02-15 LAB — GLUCOSE, CAPILLARY
Glucose-Capillary: 114 mg/dL — ABNORMAL HIGH (ref 70–99)
Glucose-Capillary: 186 mg/dL — ABNORMAL HIGH (ref 70–99)
Glucose-Capillary: 107 mg/dL — ABNORMAL HIGH (ref 70–99)
Glucose-Capillary: 206 mg/dL — ABNORMAL HIGH (ref 70–99)

## 2024-02-15 LAB — BODY FLUID CULTURE W GRAM STAIN: Culture: NO GROWTH

## 2024-02-15 MED ORDER — HEPARIN SODIUM (PORCINE) 1000 UNIT/ML IJ SOLN
INTRAMUSCULAR | Status: AC
  Filled 2024-02-15: qty 5

## 2024-02-15 MED ORDER — HEPARIN SODIUM (PORCINE) 1000 UNIT/ML IJ SOLN
2800.0000 [IU] | Freq: Once | INTRAMUSCULAR | Status: AC
  Administered 2024-02-15: 2800 [IU]

## 2024-02-15 MED ORDER — ALUM & MAG HYDROXIDE-SIMETH 200-200-20 MG/5ML PO SUSP
30.0000 mL | ORAL | Status: AC | PRN
  Administered 2024-02-15 – 2024-02-21 (×3): 30 mL via ORAL
  Filled 2024-02-15 (×3): qty 30

## 2024-02-15 MED ORDER — SALINE SPRAY 0.65 % NA SOLN
1.0000 | NASAL | Status: DC | PRN
  Administered 2024-02-15 – 2024-02-17 (×3): 1 via NASAL
  Filled 2024-02-15 (×2): qty 44

## 2024-02-15 MED ORDER — IPRATROPIUM-ALBUTEROL 0.5-2.5 (3) MG/3ML IN SOLN
3.0000 mL | Freq: Two times a day (BID) | RESPIRATORY_TRACT | Status: DC
  Administered 2024-02-15 – 2024-02-20 (×9): 3 mL via RESPIRATORY_TRACT
  Filled 2024-02-15 (×10): qty 3

## 2024-02-15 NOTE — Progress Notes (Signed)
 [facility] [place], [place] 02/15/24  Subjective:   Hospital day # 6  Patient reports worsening shortness of breath today.  Currently requiring oxygen supplementation by nasal cannula.    Patient seen sitting at bedside Room air, denies shortness of breath Urine output has been increased   Objective:  Vital signs in last 24 hours:  Temp:  [97.5 F (36.4 C)-98.1 F (36.7 C)] 97.8 F (36.6 C) (11/24 0800) Pulse Rate:  [71-99] 92 (11/24 1000) Resp:  [13-24] 15 (11/24 1000) BP: (85-154)/(57-122) 133/80 (11/24 1000) SpO2:  [96 %-100 %] 97 % (11/24 1000) Weight:  [69.6 kg-73.8 kg] 69.6 kg (11/24 0453)  Weight change: 0 kg Filed Weights   02/14/24 0500 02/14/24 1335 02/15/24 0453  Weight: 73.8 kg 73.8 kg 69.6 kg    Intake/Output:    Intake/Output Summary (Last 24 hours) at 02/15/2024 1044 Last data filed at 02/15/2024 0956 Gross per 24 hour  Intake 483 ml  Output 1900 ml  Net -1417 ml     Physical Exam: General: NAD, sitting at bedside  HEENT Moist oral mucous membranes  Pulm/lungs basilar crackles, decreased breath sounds at right base  CVS/Heart Irregular, paced  Abdomen:  Soft, nontender, nondistended  Extremities: Trace edema  Neurologic: Alert, able to answer questions appropriately  Skin: Warm, dry  Access: To be placed       Basic Metabolic Panel:  Recent Labs  Lab 02/10/24 0213 02/11/24 0447 02/11/24 0500 02/11/24 1016 02/12/24 0601 02/12/24 2346 02/13/24 0325 02/13/24 1116 02/13/24 2018 02/14/24 0429  NA 134* 137  --   --  130* 126* 128* 130* 130* 129*  K 4.4 4.7  --  4.3 4.7 5.8* 5.4* 4.8 5.0 5.0  CL 102 100  --   --  94* 92* 90* 93* 92* 91*  CO2 21* 23  --   --  20* 17* 18* 20* 19* 20*  GLUCOSE 270* 152*  --   --  74 137* 118* 117* 87 66*  BUN 34* 40*  --   --  68* 79* 73* 84* 80* 88*  CREATININE 1.44* 1.92*  --   --  2.95* 3.68* 3.83* 3.99* 4.15* 4.31*  CALCIUM  9.2 8.9  --   --  8.6* 8.7* 8.8* 8.7* 8.5* 8.7*  MG  2.7*  --  2.1 2.1 2.3 2.6*  --   --   --   --   PHOS 4.0 4.7*  --   --  4.8*  --   --   --   --   --      CBC: Recent Labs  Lab 02/10/24 0213 02/11/24 0500 02/12/24 0601 02/13/24 0630 02/14/24 1415  WBC 16.3* 11.1* 10.0 9.3 7.1  HGB 13.7 14.2 13.6 13.2 12.5  HCT 42.9 42.3 41.1 40.2 37.4  MCV 90.1 85.1 86.5 86.8 85.2  PLT 216 192 155 172 163      Lab Results  Component Value Date   HEPBSAG NON REACTIVE 02/14/2024      Microbiology:  Recent Results (from the past 240 hours)  Blood culture (routine x 2)     Status: None   Collection Time: 02/09/24  9:27 PM   Specimen: BLOOD  Result Value Ref Range Status   Specimen Description BLOOD BLOOD RIGHT ARM  Final   Special Requests   Final    BOTTLES DRAWN AEROBIC AND ANAEROBIC Blood Culture results may not be optimal due to an inadequate volume of blood received in culture bottles   Culture  Final    NO GROWTH 5 DAYS Performed at [facility], [address] [place], [place] [address]    Report Status 02/14/2024 FINAL  Final  Blood culture (routine x 2)     Status: None   Collection Time: 02/09/24  9:27 PM   Specimen: BLOOD  Result Value Ref Range Status   Specimen Description BLOOD BLOOD LEFT ARM  Final   Special Requests   Final    BOTTLES DRAWN AEROBIC AND ANAEROBIC Blood Culture results may not be optimal due to an inadequate volume of blood received in culture bottles   Culture   Final    NO GROWTH 5 DAYS Performed at [facility], [address] [place], [place] [address]    Report Status 02/14/2024 FINAL  Final  MRSA Next Gen by PCR, Nasal     Status: None   Collection Time: 02/10/24  1:49 AM   Specimen: Nasal Mucosa; Nasal Swab  Result Value Ref Range Status   MRSA by PCR Next Gen NOT DETECTED NOT DETECTED Final    Comment: (NOTE) The GeneXpert MRSA Assay (FDA approved for NASAL specimens only), is one component of a comprehensive MRSA colonization surveillance program. It is not  intended to diagnose MRSA infection nor to guide or monitor treatment for MRSA infections. Test performance is not FDA approved in patients less than 62 years old. Performed at [facility], [address] [place], [place] [address]   Respiratory (~20 pathogens) panel by PCR     Status: Abnormal   Collection Time: 02/10/24  1:49 AM   Specimen: Nasopharyngeal Swab; Respiratory  Result Value Ref Range Status   Adenovirus NOT DETECTED NOT DETECTED Final   Coronavirus 229E NOT DETECTED NOT DETECTED Final    Comment: (NOTE) The Coronavirus on the Respiratory Panel, DOES NOT test for the novel  Coronavirus (2019 nCoV)    Coronavirus HKU1 NOT DETECTED NOT DETECTED Final   Coronavirus NL63 NOT DETECTED NOT DETECTED Final   Coronavirus OC43 NOT DETECTED NOT DETECTED Final   Metapneumovirus NOT DETECTED NOT DETECTED Final   Rhinovirus / Enterovirus NOT DETECTED NOT DETECTED Final   Influenza A NOT DETECTED NOT DETECTED Final   Influenza B NOT DETECTED NOT DETECTED Final   Parainfluenza Virus 1 NOT DETECTED NOT DETECTED Final   Parainfluenza Virus 2 NOT DETECTED NOT DETECTED Final   Parainfluenza Virus 3 DETECTED (A) NOT DETECTED Final   Parainfluenza Virus 4 NOT DETECTED NOT DETECTED Final   Respiratory Syncytial Virus NOT DETECTED NOT DETECTED Final   Bordetella pertussis NOT DETECTED NOT DETECTED Final   Bordetella Parapertussis NOT DETECTED NOT DETECTED Final   Chlamydophila pneumoniae NOT DETECTED NOT DETECTED Final   Mycoplasma pneumoniae NOT DETECTED NOT DETECTED Final    Comment: Performed at [facility], [address], [place], [place] [address]  Resp panel by RT-PCR (RSV, Flu A&B, Covid) Anterior Nasal Swab     Status: None   Collection Time: 02/10/24  3:17 AM   Specimen: Anterior Nasal Swab  Result Value Ref Range Status   SARS Coronavirus 2 by RT PCR NEGATIVE NEGATIVE Final    Comment: (NOTE) SARS-CoV-2 target nucleic acids are NOT DETECTED.  The  SARS-CoV-2 RNA is generally detectable in upper respiratory specimens during the acute phase of infection. The lowest concentration of SARS-CoV-2 viral copies this assay can detect is 138 copies/mL. A negative result does not preclude SARS-Cov-2 infection and should not be used as the sole basis for  treatment or other patient management decisions. A negative result may occur with  improper specimen collection/handling, submission of specimen other than nasopharyngeal swab, presence of viral mutation(s) within the areas targeted by this assay, and inadequate number of viral copies(<138 copies/mL). A negative result must be combined with clinical observations, patient history, and epidemiological information. The expected result is Negative.  Fact Sheet for Patients:  bloggercourse.com  Fact Sheet for Healthcare Providers:  seriousbroker.it  This test is no t yet approved or cleared by the [place]  FDA and  has been authorized for detection and/or diagnosis of SARS-CoV-2 by FDA under an Emergency Use Authorization (EUA). This EUA will remain  in effect (meaning this test can be used) for the duration of the COVID-19 declaration under Section 564(b)(1) of the Act, 21 U.S.C.section 360bbb-3(b)(1), unless the authorization is terminated  or revoked sooner.       Influenza A by PCR NEGATIVE NEGATIVE Final   Influenza B by PCR NEGATIVE NEGATIVE Final    Comment: (NOTE) The Xpert Xpress SARS-CoV-2/FLU/RSV plus assay is intended as an aid in the diagnosis of influenza from Nasopharyngeal swab specimens and should not be used as a sole basis for treatment. Nasal washings and aspirates are unacceptable for Xpert Xpress SARS-CoV-2/FLU/RSV testing.  Fact Sheet for Patients: bloggercourse.com  Fact Sheet for Healthcare Providers: seriousbroker.it  This test is not yet approved or  cleared by the [place]  FDA and has been authorized for detection and/or diagnosis of SARS-CoV-2 by FDA under an Emergency Use Authorization (EUA). This EUA will remain in effect (meaning this test can be used) for the duration of the COVID-19 declaration under Section 564(b)(1) of the Act, 21 U.S.C. section 360bbb-3(b)(1), unless the authorization is terminated or revoked.     Resp Syncytial Virus by PCR NEGATIVE NEGATIVE Final    Comment: (NOTE) Fact Sheet for Patients: bloggercourse.com  Fact Sheet for Healthcare Providers: seriousbroker.it  This test is not yet approved or cleared by the [place]  FDA and has been authorized for detection and/or diagnosis of SARS-CoV-2 by FDA under an Emergency Use Authorization (EUA). This EUA will remain in effect (meaning this test can be used) for the duration of the COVID-19 declaration under Section 564(b)(1) of the Act, 21 U.S.C. section 360bbb-3(b)(1), unless the authorization is terminated or revoked.  Performed at [facility], [address] [place], [place] [address]   Culture, Respiratory w Gram Stain     Status: None   Collection Time: 02/10/24  7:56 AM   Specimen: Tracheal Aspirate; Respiratory  Result Value Ref Range Status   Specimen Description   Final    TRACHEAL ASPIRATE Performed at [facility], [address] [place], [place] [address]    Special Requests   Final    NONE Performed at [facility], [address] [place], [place] [address]    Gram Stain   Final    FEW WBC PRESENT,BOTH PMN AND MONONUCLEAR NO ORGANISMS SEEN    Culture   Final    NO GROWTH 2 DAYS Performed at [facility], [address], [place], [place] [address]    Report Status 02/12/2024 FINAL  Final  Body fluid culture w Gram Stain     Status: None   Collection Time: 02/11/24 12:25 PM   Specimen: Pleura; Body Fluid  Result Value Ref Range  Status   Specimen Description   Final    PLEURAL Performed at [facility], [address] [place], [place]  [address]    Special Requests   Final    NONE Performed at [facility], [address] [place], [place] [address]    Gram Stain   Final    WBC PRESENT, PREDOMINANTLY MONONUCLEAR NO ORGANISMS SEEN    Culture   Final    NO GROWTH 3 DAYS Performed at [facility], [address], [place], [place] [address]    Report Status 02/15/2024 FINAL  Final    Coagulation Studies: No results for input(s): LABPROT, INR in the last 72 hours.  Urinalysis: No results for input(s): COLORURINE, LABSPEC, PHURINE, GLUCOSEU, HGBUR, BILIRUBINUR, KETONESUR, PROTEINUR, UROBILINOGEN, NITRITE, LEUKOCYTESUR in the last 72 hours.  Invalid input(s): APPERANCEUR    Imaging: DG Chest Port 1 View Result Date: 02/14/2024 CLINICAL DATA:  [id number] Dyspnea [id number] EXAM: PORTABLE CHEST 1 VIEW COMPARISON:  February 13, 2024, January 25, 2024 FINDINGS: The cardiomediastinal silhouette is unchanged and enlarged in contour.Increased conspicuity of density along the RIGHT diaphragmatic border favored to reflect an increasing pleural effusion with associated atelectasis. This appears similar compared to November 3rd radiograph. No pneumothorax. Mild interstitial prominence. LEFT chest cardiac pacing device. Atherosclerotic calcifications. IMPRESSION: 1. Increased conspicuity of density along the RIGHT diaphragmatic border favored to reflect an increasing pleural effusion with atelectasis. This appears similar compared to November 3rd radiograph. 2. Mild interstitial prominence may reflect a degree of underlying pulmonary edema. Electronically Signed   By: Corean Salter M.D.   On: 02/14/2024 11:36   US  RENAL Result Date: 02/13/2024 CLINICAL DATA:  [id number] Decreased urine output [id number] EXAM: RENAL / URINARY TRACT ULTRASOUND COMPLETE COMPARISON:  February 10, 2024, January 05, 2023 FINDINGS: Right Kidney: Renal measurements: 12.4 x 4.7 by 4.3 cm = volume: 132 mL. Echogenicity within normal limits. No mass or hydronephrosis visualized. Left Kidney: Renal measurements: 10.7 x 4.2 x 5.4 cm = volume: 125 mL. Echogenicity within normal limits. No mass or hydronephrosis visualized. Echogenic focus in the region of the renal pelvis measuring 12 mm may reflect a nephrolithiasis or vascular calcification. Bladder: Not visualized secondary to shadowing bowel gas. Other: None. IMPRESSION: 1. No hydronephrosis. 2. Echogenic focus in the region of the left renal pelvis may reflect a nephrolithiasis or vascular calcification. Electronically Signed   By: Corean Salter M.D.   On: 02/13/2024 13:18     Medications:    ampicillin -sulbactam (UNASYN ) IV Stopped (02/15/24 0057)   anticoagulant sodium citrate       sodium chloride    Intravenous Once   apixaban   5 mg Oral BID   vitamin C   500 mg Oral BID   carvedilol   3.125 mg Oral BID WC   Chlorhexidine  Gluconate Cloth  6 each Topical Daily   feeding supplement  237 mL Oral BID BM   gabapentin   100 mg Oral BID   insulin  aspart  0-6 Units Subcutaneous TID WC   ipratropium-albuterol   3 mL Nebulization TID   lidocaine   1 patch Transdermal Q24H   multivitamin with minerals  1 tablet Oral Daily   sodium chloride  flush  3 mL Intravenous Q12H   sodium chloride  HYPERTONIC  4 mL Nebulization BID   acetaminophen , alteplase , alum & mag hydroxide-simeth, anticoagulant sodium citrate , benzonatate , heparin , hydrALAZINE , HYDROcodone  bit-homatropine, hydrocortisone  cream, ipratropium-albuterol , lidocaine  (PF), menthol , mouth rinse, mouth rinse, oxyCODONE , pentafluoroprop-tetrafluoroeth, polyethylene glycol, prochlorperazine , promethazine   Assessment/ Plan:  73 y.o. female with  medical problems of   PAF, PE on Xarelto , chronic HFrEF LVEF 25% with recurrent right-sided pleural effusion, PPM-ICD, HTN, IDDM, gout  admitted on  02/09/2024 for Cardiac arrest (HCC) [I46.9] CAP (community acquired pneumonia) [J18.9] Dyspnea, unspecified type [R06.00] Community acquired pneumonia, unspecified laterality [J18.9]  Complicated by PEA cardiac arrest, pulmonary embolism, large right pleural effusion, parainfluenza virus 3   AKI volume overload Acute kidney injury, oliguric, likely due to to ATN secondary to hypotension, IV contrast exposure, possible pneumonia. Pneumonia is currently managed with IV Unasyn .  Plan: Renal US  negative for hydronephrosis. Urine output has improved Dialysis received yesterday with UF 1.5L achieved. Will receive second treatment today. Will continue to monitor for renal recovery. Will assess need for third treatment in am.  Will determine need for outpatient clinic tomorrow.     Hyperkalemia Given Veltassa , no updated labs today   Acute metabolic acidosis Likely secondary to renal insufficiency. No further need of sodium bicarbonate .  Will obtain labs with dialysis.    Hyponatremia Likely secondary to AKI. Will monitor during this admission   Chronic kidney disease, stage IIIb.  Baseline creatinine 1.4/GFR 38 from 02/10/2024.  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 Will need outpatient follow-up with our office   Comorbidities Chronic systolic CHF-2D echo from 02/10/2024 show LVEF 30 to 35%, global hypokinesis, moderately reduced right ventricular systolic function   Acute pulmonary embolism-patient is currently on apixaban  5 mg twice a day   Right pleural effusion- Pulmonary and cardiac teams are following. Thoracentesis this admission       LOS: 6 Faith Harris 11/24/202510:44 AM  [facility] [place], [place] [phone]

## 2024-02-15 NOTE — Plan of Care (Signed)
  Problem: Education: Goal: Ability to describe self-care measures that may prevent or decrease complications (Diabetes Survival Skills Education) will improve Outcome: Progressing Goal: Individualized Educational Video(s) Outcome: Progressing   Problem: Coping: Goal: Ability to adjust to condition or change in health will improve Outcome: Progressing   Problem: Fluid Volume: Goal: Ability to maintain a balanced intake and output will improve Outcome: Progressing   Problem: Health Behavior/Discharge Planning: Goal: Ability to identify and utilize available resources and services will improve Outcome: Progressing Goal: Ability to manage health-related needs will improve Outcome: Progressing   Problem: Metabolic: Goal: Ability to maintain appropriate glucose levels will improve Outcome: Progressing   Problem: Nutritional: Goal: Maintenance of adequate nutrition will improve Outcome: Progressing Goal: Progress toward achieving an optimal weight will improve Outcome: Progressing   Problem: Skin Integrity: Goal: Risk for impaired skin integrity will decrease Outcome: Progressing   Problem: Tissue Perfusion: Goal: Adequacy of tissue perfusion will improve Outcome: Progressing   Problem: Activity: Goal: Ability to tolerate increased activity will improve Outcome: Progressing   Problem: Respiratory: Goal: Ability to maintain a clear airway and adequate ventilation will improve Outcome: Progressing   Problem: Role Relationship: Goal: Method of communication will improve Outcome: Progressing   Problem: Education: Goal: Knowledge of General Education information will improve Description: Including pain rating scale, medication(s)/side effects and non-pharmacologic comfort measures Outcome: Progressing   Problem: Health Behavior/Discharge Planning: Goal: Ability to manage health-related needs will improve Outcome: Progressing   Problem: Clinical Measurements: Goal:  Ability to maintain clinical measurements within normal limits will improve Outcome: Progressing Goal: Will remain free from infection Outcome: Progressing Goal: Diagnostic test results will improve Outcome: Progressing Goal: Respiratory complications will improve Outcome: Progressing Goal: Cardiovascular complication will be avoided Outcome: Progressing   Problem: Activity: Goal: Risk for activity intolerance will decrease Outcome: Progressing   Problem: Nutrition: Goal: Adequate nutrition will be maintained Outcome: Progressing   Problem: Coping: Goal: Level of anxiety will decrease Outcome: Progressing   Problem: Elimination: Goal: Will not experience complications related to bowel motility Outcome: Progressing Goal: Will not experience complications related to urinary retention Outcome: Progressing   Problem: Pain Managment: Goal: General experience of comfort will improve and/or be controlled Outcome: Progressing   Problem: Safety: Goal: Ability to remain free from injury will improve Outcome: Progressing   Problem: Skin Integrity: Goal: Risk for impaired skin integrity will decrease Outcome: Progressing   Problem: Activity: Goal: Ability to tolerate increased activity will improve Outcome: Progressing   Problem: Clinical Measurements: Goal: Ability to maintain a body temperature in the normal range will improve Outcome: Progressing   Problem: Respiratory: Goal: Ability to maintain adequate ventilation will improve Outcome: Progressing Goal: Ability to maintain a clear airway will improve Outcome: Progressing

## 2024-02-15 NOTE — Progress Notes (Signed)
 OT Cancellation Note  Patient Details Name: Deborah Dawson MRN: [id number] DOB: 04-08-1950   Cancelled Treatment:    Reason Eval/Treat Not Completed: Other (comment) (orders recieved for re-evaluation. Pt is OTF for HD. OT will reattempt as able.)  Therisa Sheffield, OTD OTR/L  02/15/24, 2:32 PM

## 2024-02-15 NOTE — Plan of Care (Signed)

## 2024-02-15 NOTE — Progress Notes (Signed)
 PROGRESS NOTE    Deborah Dawson  FMW:[id number] DOB: [birth year] DOA: 02/09/2024 PCP: Sadie Manna, MD  [id number]  LOS: 6 days   Brief hospital course:   Assessment & Plan: Deborah Dawson is a 73 y.o. female with a known history of PAF, PE on Xarelto , chronic HFrEF LVEF 25% with recurrent right-sided pleural effusion, PPM-ICD, HTN, IDDM, gout  presents to the emergency department for evaluation of shortness of breath.     Patient was in a usual state of health until earlier this month from 32 2 through 15 8 she was admitted to the hospital for acute on chronic systolic CHF with right-sided pleural effusion  for which she underwent a thoracentesis, on 11/4 to remove 900 cc and again on 11/7 to remove 700 cc.  At that time she also was found to have a small PE for which she was switched from Eliquis  to Xarelto .  She was discharged home and followed up yesterday [facility] with sent for chest x-ray for worsening shortness of breath that developed about 5 days after leaving the hospital.  She reports that she has a chronic cough which has been productive of clear to yellow sputum in the last day.    While awaiting room placement in ED, patient suddenly became unresponsive.  When pulse checked patient found to be in PEA. She underwent 1 round of CPR and ACLS prior to ROSC, she remained obtunded and was emergently intubated for airway protection. Then about 20 minutes later became pulseless in PEA again receiving 1 round of CPR and ACLS prior to her return of ROSC.  Pt was transferred to [facility] team.  Pt was extubated on 11/20 and transferred back to [facility] on 11/21.  # PEA arrest secondary to --unclear etiology.   --monitor on tele  # Acute hypoxic respiratory failure  --initial CTA chest showed Diffuse bronchial wall thickening with mucous plugging and then a day later showed rapid development of pulm edema and right pleural effusion. --treated with diuresis and thoracentesis --cont  hypertonic saline neb BID with DuoNeb --cont HD for fluid removal  Parainfluenza virus 3 infection --cough and congestion --cont hypertonic saline neb BID with DuoNeb --Tessalon  and hycodan PRN --supportive care  # Moderate to large pleural effusion  with mediastinal lymphadenopathy raising concern for an underlying lymphoproliferative disease --s/p thoracentesis --cont HD for fluid removal  # Acute on chronic Heart failure with reduced EF 20 to 25%  --received IV lasix  --hold further diuresis now due to AKI --cont coreg   AKI  Metabolic acidosis --very reduced urine output --nephro consulted --s/p IV Na bicarb --started on HD  #. Mild hyperkalemia --due to AKI --s/p veltassa  --HD to remove potassium   #.  History of PAF - Continue carvedilol  --switched from Xarelto  to Eliquis  due to kidney function --resume eliquis    #. DM2 Hypoglycemia --hypoglycemia due to poor oral intake --d/c'ed glargine --SSI very sensitive scale  Acute 7th rib fracture --Acute minimally displaced fracture of the left anterolateral seventh rib.  --from CPR during codes --pt doesn't want to take opioid pain meds. --tylenol  PRN   DVT prophylaxis: On:Eliquis  Code Status: Full code  Family Communication:  Level of care: Progressive Dispo:   The patient is from: home Anticipated d/c is to: SNF rehab Anticipated d/c date is: > 3 days   Subjective and Interval History:  Pt continued to have rib pain.  Started dialysis yesterday, tolerated well.  Urine output increased.     Objective: Vitals:   02/15/24  1530 02/15/24 1553 02/15/24 1628 02/15/24 1700  BP: (!) 139/101 131/88 (!) 137/90 (!) 150/117  Pulse: 82 87 87 92  Resp: 14 18 14 20   Temp:  97.7 F (36.5 C) (!) 97.5 F (36.4 C)   TempSrc:  Oral Axillary   SpO2:   100% 100%  Weight:      Height:        Intake/Output Summary (Last 24 hours) at 02/15/2024 1726 Last data filed at 02/15/2024 1553 Gross per 24 hour  Intake 483  ml  Output 1400 ml  Net -917 ml   Filed Weights   02/14/24 1335 02/15/24 0453 02/15/24 1157  Weight: 73.8 kg 69.6 kg 70.1 kg    Examination:   Constitutional: NAD, AAOx3 HEENT: conjunctivae and lids normal, EOMI CV: No cyanosis.   RESP: normal respiratory effort Neuro: II - XII grossly intact.   Psych: Normal mood and affect.  Appropriate judgement and reason   Data Reviewed: I have personally reviewed labs and imaging studies  Time spent: 50 minutes  [name], MD [facility] If 7PM-7AM, please contact night-coverage 02/15/2024, 5:26 PM

## 2024-02-15 NOTE — Progress Notes (Signed)
 [facility] CLINIC CARDIOLOGY PROGRESS NOTE       Patient ID: MARLO ARRIOLA MRN: [id number] DOB/AGE: [birth year] 73 y.o.  Admit date: 02/09/2024 Referring Physician Inge Lecher, NP Primary Physician Sadie Manna, MD  Primary Cardiologist Dr. Ammon Reason for Consultation post-PEA arrest  HPI: Deborah Dawson is a 73 y.o. female  with a past medical history of chronic HFrEF, s/p CRT-P 07/2021, persistent atrial fibrillation, hypertension, type 2 diabetes who presented to the ED on 02/09/2024 for shortness of breath. CTA chest concerning for PNA. Overnight had PEA arrest x2 with ROSC. Cardiology was consulted for further evaluation.   Interval history: - Patient seen and examined this morning, family at bedside. - Remains on 2L [place]. With some conversational dyspnea this AM.  - She denies any chest pain, shortness of breath. - No significant LE edema on exam.   Review of systems complete and found to be negative unless listed above    Past Medical History:  Diagnosis Date   Allergic genetic state    Atrial fibrillation (HCC) 2010   Cardiomyopathy (HCC)    CHF (congestive heart failure) (HCC)    Diabetes mellitus without complication (HCC)    Dysrhythmia    Gout    H/O cardiac catheterization    Hypertension    Joint pain    Osteopenia    Presence of permanent cardiac pacemaker    Psoriasis     Past Surgical History:  Procedure Laterality Date   ABDOMINAL HYSTERECTOMY  1985   BREAST EXCISIONAL BIOPSY Left    negative years ago   BREAST SURGERY Left 1997   papilloma   CARDIAC CATHETERIZATION Left 08/01/2015   Procedure: Left Heart Cath and Coronary Angiography;  Surgeon: Vinie DELENA Jude, MD;  Location: [facility] INVASIVE CV LAB;  Service: Cardiovascular;  Laterality: Left;   CHOLECYSTECTOMY  1986   COLONOSCOPY WITH PROPOFOL  N/A 08/26/2017   Procedure: COLONOSCOPY WITH PROPOFOL ;  Surgeon: Toledo, Ladell POUR, MD;  Location: [facility] ENDOSCOPY;  Service: Gastroenterology;  Laterality:  N/A;   ESOPHAGOGASTRODUODENOSCOPY (EGD) WITH PROPOFOL  N/A 08/26/2017   Procedure: ESOPHAGOGASTRODUODENOSCOPY (EGD) WITH PROPOFOL ;  Surgeon: Toledo, Ladell POUR, MD;  Location: [facility] ENDOSCOPY;  Service: Gastroenterology;  Laterality: N/A;   ESOPHAGOGASTRODUODENOSCOPY (EGD) WITH PROPOFOL  N/A 07/04/2019   Procedure: ESOPHAGOGASTRODUODENOSCOPY (EGD) WITH PROPOFOL ;  Surgeon: Toledo, Ladell POUR, MD;  Location: [facility] ENDOSCOPY;  Service: Gastroenterology;  Laterality: N/A;   ESOPHAGOGASTRODUODENOSCOPY (EGD) WITH PROPOFOL  N/A 06/12/2023   Procedure: ESOPHAGOGASTRODUODENOSCOPY (EGD) WITH PROPOFOL ;  Surgeon: Maryruth Ole DASEN, MD;  Location: [facility] ENDOSCOPY;  Service: Endoscopy;  Laterality: N/A;  DM   TUBAL LIGATION  1981    Medications Prior to Admission  Medication Sig Dispense Refill Last Dose/Taking   carvedilol  (COREG ) 12.5 MG tablet Take 1 tablet (12.5 mg total) by mouth 2 (two) times daily with a meal. 60 tablet 0 02/09/2024 Morning   gabapentin  (NEURONTIN ) 100 MG capsule Take 100 mg by mouth 2 (two) times daily.   02/09/2024 Morning   insulin  glargine (LANTUS  SOLOSTAR) 100 UNIT/ML Solostar Pen Inject 20 Units into the skin at bedtime.   02/08/2024   isosorbide  mononitrate (IMDUR ) 30 MG 24 hr tablet Take 1 tablet (30 mg total) by mouth daily. 30 tablet 0 02/09/2024   Magnesium  Gluconate 550 MG TABS Take by mouth.   Taking   RIVAROXABAN  (XARELTO ) VTE STARTER PACK (15 & 20 MG) Follow package directions: Take one 15mg  tablet by mouth twice a day. On day 22, switch to one 20mg  tablet once a day. Take  with food. 51 each 0 02/09/2024 Morning   spironolactone  (ALDACTONE ) 25 MG tablet Take 1/2 tablet (12.5 mg total) by mouth daily. 30 tablet 0 02/09/2024   Vitamin D, Ergocalciferol, 2000 units CAPS Take 1 capsule by mouth daily.   Taking   Social History   Socioeconomic History   Marital status: Married    Spouse name: Not on file   Number of children: Not on file   Years of education: Not on file    Highest education level: Not on file  Occupational History   Not on file  Tobacco Use   Smoking status: Never   Smokeless tobacco: Never  Vaping Use   Vaping status: Never Used  Substance and Sexual Activity   Alcohol use: No    Alcohol/week: 0.0 standard drinks of alcohol   Drug use: No   Sexual activity: Not on file    Comment: Married   Other Topics Concern   Not on file  Social History Narrative   Not on file   Social Drivers of Health   Financial Resource Strain: Low Risk  (11/02/2023)   Received from [facility]   Overall Financial Resource Strain (CARDIA)    Difficulty of Paying Living Expenses: Not hard at all  Food Insecurity: Patient Unable To Answer (02/10/2024)   Hunger Vital Sign    Worried About Running Out of Food in the Last Year: Patient unable to answer    Ran Out of Food in the Last Year: Patient unable to answer  Transportation Needs: Patient Unable To Answer (02/10/2024)   PRAPARE - Transportation    Lack of Transportation (Medical): Patient unable to answer    Lack of Transportation (Non-Medical): Patient unable to answer  Physical Activity: Not on file  Stress: Not on file  Social Connections: Patient Unable To Answer (02/10/2024)   Social Connection and Isolation Panel    Frequency of Communication with Friends and Family: Patient unable to answer    Frequency of Social Gatherings with Friends and Family: Patient unable to answer    Attends Religious Services: Patient unable to answer    Active Member of Clubs or Organizations: Patient unable to answer    Attends Banker Meetings: Patient unable to answer    Marital Status: Patient unable to answer  Intimate Partner Violence: Patient Unable To Answer (02/10/2024)   Humiliation, Afraid, Rape, and Kick questionnaire    Fear of Current or Ex-Partner: Patient unable to answer    Emotionally Abused: Patient unable to answer    Physically Abused: Patient unable to answer     Sexually Abused: Patient unable to answer    Family History  Problem Relation Age of Onset   Cancer Mother 63       breast   Diabetes Mother    Breast cancer Mother 78   Coronary artery disease Mother    Stroke Father    Coronary artery disease Father    Diabetes Son      Vitals:   02/15/24 0453 02/15/24 0500 02/15/24 0600 02/15/24 0700  BP:  128/81 130/88 118/84  Pulse:  77 81 82  Resp:  16 16 13   Temp:      TempSrc:      SpO2:  100% 97% 98%  Weight: 69.6 kg     Height:        PHYSICAL EXAM General: Chronically ill-appearing elderly female.  No acute distress. HEENT: Normocephalic and atraumatic. Neck: No JVD.  Lungs: Normal respiratory  effort.  Mild bibasilar crackles. Heart: HRRR. Normal S1 and S2 without gallops or murmurs.  Abdomen: Non-distended appearing.  Msk: Normal strength and tone for age. Extremities: Warm and well perfused. No clubbing, cyanosis.  No edema.  Neuro: Alert and oriented X 3. Psych: Answers questions appropriately.   Labs: Basic Metabolic Panel: Recent Labs    02/12/24 2346 02/13/24 0325 02/13/24 2018 02/14/24 0429  NA 126*   < > 130* 129*  K 5.8*   < > 5.0 5.0  CL 92*   < > 92* 91*  CO2 17*   < > 19* 20*  GLUCOSE 137*   < > 87 66*  BUN 79*   < > 80* 88*  CREATININE 3.68*   < > 4.15* 4.31*  CALCIUM  8.7*   < > 8.5* 8.7*  MG 2.6*  --   --   --    < > = values in this interval not displayed.   Liver Function Tests: No results for input(s): AST, ALT, ALKPHOS, BILITOT, PROT, ALBUMIN in the last 72 hours.  No results for input(s): LIPASE, AMYLASE in the last 72 hours. CBC: Recent Labs    02/13/24 0630 02/14/24 1415  WBC 9.3 7.1  HGB 13.2 12.5  HCT 40.2 37.4  MCV 86.8 85.2  PLT 172 163   Cardiac Enzymes: No results for input(s): CKTOTAL, CKMB, CKMBINDEX, TROPONINIHS in the last 72 hours. BNP: No results for input(s): BNP in the last 72 hours. D-Dimer: No results for input(s): DDIMER in the  last 72 hours. Hemoglobin A1C: No results for input(s): HGBA1C in the last 72 hours. Fasting Lipid Panel: No results for input(s): CHOL, HDL, LDLCALC, TRIG, CHOLHDL, LDLDIRECT in the last 72 hours. Thyroid Function Tests: No results for input(s): TSH, T4TOTAL, T3FREE, THYROIDAB in the last 72 hours.  Invalid input(s): FREET3 Anemia Panel: No results for input(s): VITAMINB12, FOLATE, FERRITIN, TIBC, IRON, RETICCTPCT in the last 72 hours.   Radiology: DG Abd 1 View Result Date: 02/10/2024 EXAM: 1 VIEW XRAY OF THE ABDOMEN 02/10/2024 04:44:00 AM COMPARISON: 02/10/2024 CLINICAL HISTORY: Encounter for imaging study to confirm orogastric (OG) tube placement FINDINGS: LINES, TUBES AND DEVICES: Gastric tube in place with side hole at the level of the gastric body. Cardiac pacemaker noted. BOWEL: Nonobstructive bowel gas pattern. SOFT TISSUES: Surgical clips in the right upper quadrant. No opaque urinary calculi. BONES: No acute osseous abnormality. LUNG BASES: Interstitial and patchy airspace opacities in the partially visualized lung bases, probably atelectasis. IMPRESSION: 1. Satisfactory enteric tube placement into the stomach. Electronically signed by: Helayne Hurst MD 02/10/2024 05:25 AM EST RP Workstation: [id number]   DG Abd 1 View Result Date: 02/10/2024 EXAM: 1 VIEW XRAY OF THE ABDOMEN 02/10/2024 04:05:00 AM COMPARISON: None available. CLINICAL HISTORY: Encounter for imaging study to confirm orogastric (OG) tube placement. FINDINGS: LINES, TUBES AND DEVICES: Enteric tube in place with tip and side port overlying the expected region of the gastric lumen. Cardiac pacemaker noted. BOWEL: Nonobstructive bowel gas pattern. SOFT TISSUES: No opaque urinary calculi. BONES: No acute osseous abnormality. LUNG BASES: Patchy airspace opacities in the partially visualized lung bases. IMPRESSION: 1. Enteric tube in appropriate position with tip and side port overlying the  expected region of the gastric lumen. 2. Patchy airspace opacities in the partially visualized lung bases. Electronically signed by: Dorethia Molt MD 02/10/2024 04:25 AM EST RP Workstation: [id number]   CT CHEST WO CONTRAST Result Date: 02/10/2024 EXAM: CT CHEST WITHOUT CONTRAST 02/10/2024 01:28:16 AM TECHNIQUE: CT of the chest  was performed without the administration of intravenous contrast. Multiplanar reformatted images are provided for review. Automated exposure control, iterative reconstruction, and/or weight based adjustment of the mA/kV was utilized to reduce the radiation dose to as low as reasonably achievable. COMPARISON: Vertebrae 18 to 2025. CLINICAL HISTORY: CXR post cardiac arrest: predominantly within the right upper lobe, possibly representing acute pneumonic consolidation, pulmonary hemorrhage, or asymmetric alveolar pulmonary edema as could be seen with acute mitral valve regurgitation. FINDINGS: MEDIASTINUM: Heart: Mild coronary artery calcification. Mild cardiomegaly. A left subclavian dual-lead pacemaker is identified with leads within the left ventricular venous outflow and right ventricle toward the apex. No pericardial effusion. Vessels: The central pulmonary arteries are enlarged in keeping with changes of pulmonary arterial hypertension. Mild atherosclerotic calcification within the thoracic aorta. No aortic aneurysm. Airways: The central airways are clear. Interval endotracheal tube placement attempts 2.6 cm above the carina. Other: Interval nasogastric tube placement extending into the stomach beyond the margin of the exam. Visualized thyroid is unremarkable. LYMPH NODES: Extensive mediastinal adenopathy is again identified and appears stable, nonspecific. This may be reactive, inflammatory as can be seen with conditions such as sarcoidosis, or lymphoproliferative in etiology. Comparison with interval examinations, if available, would be helpful in determining stability. If none are  available, follow-up CT or PET CT examination would be helpful in 3 months to demonstrate stability or resolution. LUNGS AND PLEURA: Extensive asymmetric pulmonary ground-glass infiltrate, multiple consolidation, and smooth interlobular septal thickening, asymmetrically more severe within the right upper lobe. Given its relatively rapid development since prior examination, the findings are most suggestive of asymmetric pulmonary edema, though pulmonary hemorrhage could appear similarly. Extensive pneumonic infiltrate or changes of ARDS are considered less likely given the relatively rapid development. Moderate to large right pleural effusion again noted with compressive atelectasis of the right lower lobe. No pneumothorax. SOFT TISSUES/BONES: Acute fracture of the left seventh rib anterolaterally with minimal displacement. No other acute bone abnormality identified. Osseous structures are otherwise age appropriate. UPPER ABDOMEN: Limited images of the upper abdomen demonstrates no acute abnormality. IMPRESSION: 1. Extensive asymmetric pulmonary ground-glass infiltrate, multifocal consolidation, and smooth interlobular septal thickening, greatest in the right upper lobe, most suggestive of asymmetric pulmonary edema given rapid interval development; acute pulmonary hemorrhage is a differential consideration. Extensive pneumonic infiltrate or ARDS considered less likely. 2. Moderate to large right pleural effusion with compressive atelectasis of the right lower lobe. 3. Acute minimally displaced fracture of the left anterolateral seventh rib. 4. Thoracic adenopathy as outlined above. This is nonspecific, but may be reactive, inflammatory as can be seen with conditions such as sarcoidosis, or lymphoproliferative in etiology. Comparison with interval examinations, if available, be helpful in determining stability. If none are available, follow-up CT or PET CT examination would be helpful in 3 months to demonstrate  stability or resolution. 5. Findings consistent with pulmonary arterial hypertension (enlarged central pulmonary arteries). 6. Mild coronary artery calcification and mild cardiomegaly. Electronically signed by: Dorethia Molt MD 02/10/2024 01:46 AM EST RP Workstation: [id number]   CT HEAD WO CONTRAST ( ) Result Date: 02/10/2024 EXAM: CT HEAD WITHOUT CONTRAST 02/10/2024 01:28:16 AM TECHNIQUE: CT of the head was performed without the administration of intravenous contrast. Automated exposure control, iterative reconstruction, and/or weight based adjustment of the mA/kV was utilized to reduce the radiation dose to as low as reasonably achievable. COMPARISON: None available. CLINICAL HISTORY: Mental status change, unknown cause. FINDINGS: BRAIN AND VENTRICLES: Cerebral ventricle sizes are concordant with the degree of cerebral volume loss.No acute hemorrhage. No  evidence of acute infarct. No hydrocephalus. No extra-axial collection. No mass effect or midline shift. Residual contrast material within intracranial vasculature from recent contrast administration. ORBITS: Bilateral lens replacement. SINUSES: No acute abnormality. SOFT TISSUES AND SKULL: Partially imaged endotracheal tube in place. No acute soft tissue abnormality. No skull fracture. IMPRESSION: 1. No acute intracranial abnormality. Electronically signed by: Morgane Naveau MD 02/10/2024 01:40 AM EST RP Workstation: [id number]   DG Chest Portable 1 View Result Date: 02/10/2024 EXAM: 1 VIEW(S) XRAY OF THE CHEST 02/10/2024 12:12:00 AM COMPARISON: 02/09/2024 CLINICAL HISTORY: post intubation FINDINGS: LINES, TUBES AND DEVICES: Endotracheal tube in place with tip 2.2 cm above the carina. Enteric tube in place, courses below the diaphragm. LUNGS AND PLEURA: Interval development of extensive airspace infiltrate within the right lung, predominantly within the right upper lobe. This may represent acute pneumonic consolidation, pulmonary hemorrhage or  asymmetric alveolar pulmonary edema as could be seen with acute mitral valve regurgitation. Known right pleural effusion seen on CT examination of 02/09/2024 is not well appreciated on this supine radiograph. No definite pneumothorax. HEART AND MEDIASTINUM: Similar mild cardiomegaly. Left chest cardiac pacemaker noted. Aortic arch atherosclerosis. BONES AND SOFT TISSUES: No acute osseous abnormality. IMPRESSION: 1. Interval development of extensive airspace infiltrate within the right lung, predominantly within the right upper lobe, possibly representing acute pneumonic consolidation, pulmonary hemorrhage, or asymmetric alveolar pulmonary edema as could be seen with acute mitral valve regurgitation. 2. Known right pleural effusion seen on CT examination of 02/09/2024 is not well appreciated on this supine radiograph. No definite pneumothorax. Electronically signed by: Dorethia Molt MD 02/10/2024 12:22 AM EST RP Workstation: [id number]   CT Angio Chest PE W and/or Wo Contrast Result Date: 02/09/2024 EXAM: CTA CHEST 02/09/2024 06:29:20 PM TECHNIQUE: CTA of the chest was performed without and with the administration of 75 mL of iohexol  (OMNIPAQUE ) 350 MG/ML injection. Multiplanar reformatted images are provided for review. MIP images are provided for review. Automated exposure control, iterative reconstruction, and/or weight based adjustment of the mA/kV was utilized to reduce the radiation dose to as low as reasonably achievable. COMPARISON: None available. CLINICAL HISTORY: Pulmonary embolism (PE) suspected, low to intermediate prob, positive D-dimer. FINDINGS: PULMONARY ARTERIES: Pulmonary arteries are adequately opacified for evaluation. No acute pulmonary embolus. Main pulmonary artery is normal in caliber. MEDIASTINUM: The heart and pericardium demonstrate no acute abnormality. There is no acute abnormality of the thoracic aorta. LYMPH NODES: Right hilar lymphadenopathy with, as an example, a 1.2 cm lymph  node (5:54). Left hilar lymphadenopathy with, as an example, a 1.2 cm lymph node (5 x 15 x 4 mm). Mediastinal lymphadenopathy with a right paratracheal 1.1 cm lymph node (5.32). No axillary lymphadenopathy. LUNGS AND PLEURA: At least a small volume right pleural effusion. No left pleural effusion. No pneumothorax. Basal atelectasis of the right lower lobe. Diffuse bronchial wall thickening with mucous plugging within the right lower lobe. Mucous plugging within the left lower lobe to a lesser extent. Question of developing small consolidation within the right lower lobe (6:88) with associated bronchial plugging. The trachea and mainstem bronchi are patent. Expiratory phase respiration. UPPER ABDOMEN: Limited images of the upper abdomen are unremarkable. SOFT TISSUES AND BONES: Left chest wall 2 lead cardiac device. No acute bone or soft tissue abnormality. IMPRESSION: 1. No pulmonary embolism. 2. Diffuse bronchial wall thickening with mucous plugging, greater in the right lower lobe, with probable developing small right lower lobe consolidation. 3. Small right pleural effusion. 4. Right hilar, left hilar, and mediastinal lymphadenopathy. Likely  reactive in etiology. Recommend attention on follow-up. Electronically signed by: Morgane Naveau MD 02/09/2024 07:12 PM EST RP Workstation: [id number]   DG Chest 2 View Result Date: 02/09/2024 CLINICAL DATA:  Shortness of breath and pleural effusion. EXAM: CHEST - 2 VIEW COMPARISON:  Chest x-ray 01/29/2024.  Chest CT 01/24/2024. FINDINGS: There is a small right pleural effusion. Left lung is clear. No pneumothorax or focal lung infiltrate. The cardiac silhouette is enlarged. Left-sided pacemaker is again seen. No acute fractures are identified. IMPRESSION: 1. Small right pleural effusion. 2. Cardiomegaly. Electronically Signed   By: Greig Pique M.D.   On: 02/09/2024 15:56   US  THORACENTESIS ASP PLEURAL SPACE W/IMG GUIDE Result Date: 01/29/2024 INDICATION: 73 year old  female presents with shortness of breath, cough, and recurrent right pleural effusion. Received request for diagnostic and therapeutic thoracentesis. EXAM: ULTRASOUND GUIDED RIGHT THORACENTESIS MEDICATIONS: 10 mL 1% lidocaine  COMPLICATIONS: None immediate. PROCEDURE: An ultrasound guided thoracentesis was thoroughly discussed with the patient and questions answered. The benefits, risks, alternatives and complications were also discussed. The patient understands and wishes to proceed with the procedure. Written consent was obtained. Ultrasound was performed to localize and mark an adequate pocket of fluid in the right chest. The area was then prepped and draped in the normal sterile fashion. 1% lidocaine  was used for local anesthesia. Under ultrasound guidance a 6 Fr Safe-T-Centesis catheter was introduced. Thoracentesis was performed. The catheter was removed and a dressing applied. FINDINGS: A total of approximately 700 mL of amber fluid was removed. Samples were sent to the laboratory as requested by the clinical team. IMPRESSION: Successful ultrasound guided right thoracentesis yielding 700 mL of pleural fluid. Performed by: Kristi Davenport, NP Electronically Signed   By: Cordella Banner   On: 01/29/2024 15:39   DG Chest Port 1 View Result Date: 01/29/2024 EXAM: 1 VIEW(S) XRAY OF THE CHEST 01/29/2024 11:40:00 AM COMPARISON: 01/29/2024 CLINICAL HISTORY: Recurrent right pleural effusion FINDINGS: LINES, TUBES AND DEVICES: Left chest pacemaker with leads terminating in the right atrium and coronary sinus. LUNGS AND PLEURA: No focal pulmonary opacity. No pulmonary edema. Near complete resolution of the right pleural effusion with minimal blunting of the right costophrenic angle. Improved aeration of right lung base. No pneumothorax. HEART AND MEDIASTINUM: Cardiomegaly. BONES AND SOFT TISSUES: No acute osseous abnormality. Cholecystectomy clips. IMPRESSION: 1. Near complete resolution of the right pleural  effusion with minimal residual blunting of the right costophrenic angle and improved aeration of the right lung base. Electronically signed by: Rogelia Myers MD 01/29/2024 12:53 PM EST RP Workstation: [id number]   DG Chest 2 View Result Date: 01/29/2024 EXAM: 2 VIEW(S) XRAY OF THE CHEST 01/29/2024 06:46:00 AM COMPARISON: 01/26/2024 CLINICAL HISTORY: Pleural effusion on right. FINDINGS: LINES, TUBES AND DEVICES: Stable 2 lead left chest pacemaker. LUNGS AND PLEURA: Increased right base airspace disease. Small right pleural effusion, mildly increased. No pulmonary edema. No pneumothorax. HEART AND MEDIASTINUM: No acute abnormality of the cardiac and mediastinal silhouettes. BONES AND SOFT TISSUES: Upper abdominal surgical clips noted. No acute osseous abnormality. IMPRESSION: 1. Redeveloping small right pleural effusion with increased right base airspace disease, most likely atelectasis. 2. No pneumothorax. Electronically signed by: Rockey Kilts MD 01/29/2024 11:11 AM EST RP Workstation: [id number]   US  THORACENTESIS ASP PLEURAL SPACE W/IMG GUIDE Result Date: 01/26/2024 INDICATION: 73 year old female presents with shortness of breath. Received request for diagnostic and therapeutic thoracentesis. EXAM: ULTRASOUND GUIDED RIGHT THORACENTESIS MEDICATIONS: 8 mL 1% lidocaine  COMPLICATIONS: None immediate. PROCEDURE: An ultrasound guided thoracentesis was  thoroughly discussed with the patient and questions answered. The benefits, risks, alternatives and complications were also discussed. The patient understands and wishes to proceed with the procedure. Written consent was obtained. Ultrasound was performed to localize and mark an adequate pocket of fluid in the right chest. The area was then prepped and draped in the normal sterile fashion. 1% lidocaine  was used for local anesthesia. Under ultrasound guidance a 6 Fr Safe-T-Centesis catheter was introduced. Thoracentesis was performed. The catheter was removed and a  dressing applied. FINDINGS: A total of approximately 900 mL of clear yellow fluid was removed. Samples were sent to the laboratory as requested by the clinical team. IMPRESSION: Successful ultrasound guided right thoracentesis yielding 900 mL of pleural fluid. Performed by: Rayfield Buff, NP Electronically Signed   By: Cordella Banner   On: 01/26/2024 14:49   DG Chest Port 1 View Result Date: 01/26/2024 EXAM: 1 VIEW(S) XRAY OF THE CHEST 01/26/2024 01:02:00 PM COMPARISON: 01/25/2024 CLINICAL HISTORY: SOB (shortness of breath); Status post thoracentesis FINDINGS: LUNGS AND PLEURA: Evacuation of right pleural effusion with a small amount of residual fluid. Minimal overlying atelectasis. The left lung remains clear No post-procedural pneumothorax. No pulmonary edema. No pneumothorax. HEART AND MEDIASTINUM: Cardiomegaly with left subclavian approach cardiac rhythm maintenance device. Aortic arch atherosclerosis. BONES AND SOFT TISSUES: No acute osseous abnormality. IMPRESSION: 1. Evacuation of right pleural effusion with a small residual effusion and minimal overlying atelectasis. No pneumothorax. 2. No post-procedure or pneumothorax. Electronically signed by: Maude Stammer MD 01/26/2024 01:38 PM EST RP Workstation: [id number]   DG Chest Port 1 View Result Date: 01/25/2024 EXAM: 1 VIEW(S) XRAY OF THE CHEST 01/25/2024 09:41:07 AM COMPARISON: 01/24/2024 CLINICAL HISTORY: Shortness of breath FINDINGS: LUNGS AND PLEURA: Small right pleural effusion. Increased right lower and developing inferior right upper lobe airspace disease. Diffuse interstitial thickening. No pneumothorax. HEART AND MEDIASTINUM: Left subclavian dual lead pacemaker unchanged. Cardiomegaly. BONES AND SOFT TISSUES: No acute osseous abnormality. IMPRESSION: 1. Persistent small right pleural effusion with increased right lower and developing inferior right upper airspace disease, favoring pneumonia 2. Cardiomegaly with increased interstitial  thickening, favoring mild pulmonary edema Electronically signed by: Rockey Kilts MD 01/25/2024 05:05 PM EST RP Workstation: [id number]   US  Venous Img Lower Bilateral (DVT) Result Date: 01/24/2024 CLINICAL DATA:  Pulmonary embolism. EXAM: BILATERAL LOWER EXTREMITY VENOUS DOPPLER ULTRASOUND TECHNIQUE: Gray-scale sonography with compression, as well as color and duplex ultrasound, were performed to evaluate the deep venous system(s) from the level of the common femoral vein through the popliteal and proximal calf veins. COMPARISON:  None Available. FINDINGS: VENOUS Normal compressibility of the common femoral, superficial femoral, and popliteal veins, as well as the visualized calf veins. Visualized portions of profunda femoral vein and great saphenous vein unremarkable. No filling defects to suggest DVT on grayscale or color Doppler imaging. Doppler waveforms show normal direction of venous flow, normal respiratory plasticity and response to augmentation. Limited views of the contralateral common femoral vein are unremarkable. OTHER None. Limitations: none IMPRESSION: Negative. Electronically Signed   By: Leita Birmingham M.D.   On: 01/24/2024 14:19   CT Angio Chest PE W and/or Wo Contrast Addendum Date: 01/24/2024 ** ADDENDUM #1 ** ADDENDUM: The above findings were discussed with Dr. Levander at 9:53 am 01/24/24. ---------------------------------------------------- Electronically signed by: Evalene Coho MD 01/24/2024 10:49 AM EST RP Workstation: [id number]   Result Date: 01/24/2024 ** ORIGINAL REPORT ** EXAM: CTA of the Chest with contrast for PE 01/24/2024 09:25:18 AM TECHNIQUE: CTA of the chest was performed  without and with the administration of 75 mL of iohexol  (OMNIPAQUE ) 350 MG/ML injection. Multiplanar reformatted images are provided for review. MIP images are provided for review. Automated exposure control, iterative reconstruction, and/or weight based adjustment of the mA/kV was utilized to reduce the  radiation dose to as low as reasonably achievable. COMPARISON: PA and lateral radiographs of the chest dated 01/24/2024. CLINICAL HISTORY: Pulmonary embolism (PE) suspected, low to intermediate prob, positive D-dimer. FINDINGS: PULMONARY ARTERIES: Pulmonary arteries are adequately opacified for evaluation. There is nonocclusive thromboembolic disease present within the distal right pulmonary artery and within the right lower lobe artery and anterior segmental branch of the right lower lobe pulmonary artery. There is also thromboembolus within the posterior segmental artery. There is no definite thromboembolic disease demonstrated in the left lung. Main pulmonary artery is normal in caliber. MEDIASTINUM: The heart is enlarged. There is no evidence of right ventricular strain. There is mild calcific coronary artery disease. The thoracic aorta demonstrates mild-to-moderate calcific atheromatous disease. The ascending thoracic aorta measures approximately 3.3 cm in diameter. LYMPH NODES: There are numerous enlarged mediastinal lymph nodes. There is also right hilar lymphadenopathy. No axillary lymphadenopathy. LUNGS AND PLEURA: There is mild dependent atelectasis within the right lung. There is a moderate right-sided pleural effusion. No pneumothorax. No focal consolidation or pulmonary edema. UPPER ABDOMEN: Limited images of the upper abdomen are unremarkable. SOFT TISSUES AND BONES: No acute bone or soft tissue abnormality. IMPRESSION: 1. Nonocclusive pulmonary emboli involving the distal right pulmonary artery, right lower lobe artery, and anterior and posterior segmental branches; no left-sided PE identified. No CT evidence of right heart strain. 2. Moderate right pleural effusion. 3. Cardiomegaly with mild coronary artery calcifications and mild-to-moderate thoracic aortic atherosclerosis. 4. Enlarged mediastinal and right hilar lymph nodes; recommend clinical correlation and follow-up per  oncologic/infectious/inflammatory considerations. Electronically signed by: Evalene Coho MD 01/24/2024 09:51 AM EST RP Workstation: [id number]   DG Chest 2 View Result Date: 01/24/2024 CLINICAL DATA:  Shortness of breath. EXAM: CHEST - 2 VIEW COMPARISON:  08/08/2020 FINDINGS: Mild cardiomegaly, with dual lead pacemaker in place. Small to moderate right pleural effusion. Right basilar atelectasis versus infiltrate. IMPRESSION: Small to moderate right pleural effusion, with right basilar atelectasis versus infiltrate. Electronically Signed   By: Norleen DELENA Kil M.D.   On: 01/24/2024 08:29    ECHO 02/10/24: 1. TDS.  2. Left ventricular ejection fraction, by estimation, is 30 to 35%. The left ventricle has moderately decreased function. The left ventricle demonstrates global hypokinesis. Left ventricular diastolic function could  not be evaluated.  3. Right ventricular systolic function is moderately reduced. The right  ventricular size is mildly enlarged. Mildly increased right ventricular  wall thickness.  4. Left atrial size was mildly dilated.  5. Right atrial size was mildly dilated.  6. The mitral valve is normal in structure. No evidence of mitral valve  regurgitation.  7. The aortic valve was not well visualized. Aortic valve regurgitation  is not visualized.   TELEMETRY (personally reviewed): VP rate 80-90s  EKG (personally reviewed): VP rate 69 bpm  Data reviewed by me 02/15/2024: last 24h vitals tele labs imaging I/O ED provider note, admission H&P, hospitalist progress note  Principal Problem:   CAP (community acquired pneumonia) Active Problems:   Acute renal failure   Cardiac arrest (HCC)   Pressure injury of skin    ASSESSMENT AND PLAN:  Deborah Dawson is a 73 y.o. female  with a past medical history of chronic HFrEF, s/p CRT-P 07/2021, persistent  atrial fibrillation, hypertension, type 2 diabetes who presented to the ED on 02/09/2024 for shortness of breath. CTA chest  concerning for PNA. Overnight had PEA arrest x2 with ROSC. Cardiology was consulted for further evaluation.   # PEA arrest # Pneumonia # Acute on chronic HFrEF # S/p CRT-P 07/2021 # Persistent atrial fibrillation Patient initially presented with shortness of breath, concern for pneumonia on CTA chest.  Overnight last night she had PEA arrest with ROSC then shortly afterwards arrested again.  Intubated in the ED.  Troponins mildly elevated and flat trending.  BNP significantly elevated at 15,900. EKG without acute ischemic changes.  Echo this admission with EF 30-35%, global hypokinesis, moderately reduced RV function with mild enlargement and mild RV thicknes. -Per nephro, planning for HD. Appreciate their recommendations. -Continue carvedilol  3.125 mg twice daily. Will consider reintroducing other GDMT as able. -Mildly elevated and flat troponins most consistent with demand/supply mismatch and not ACS.  -Further management per PCCM.   This patient's plan of care was discussed and created with Dr. Custovic and she is in agreement.  Signed: Danita Bloch, PA-C  02/15/2024, 7:58 AM [facility]

## 2024-02-15 NOTE — TOC Progression Note (Signed)
 Transition of Care [facility]) - Progression Note    Patient Details  Name: Deborah Dawson MRN: [id number] Date of Birth: 24-May-1950  [facility]) CM/SW Contact  [name], LCSW Phone Number: 02/15/2024, 1:36 PM  Clinical Narrative:    Chart reviewed. PT/OT recommendation of SNF placement. I spoke with the patient at bedside. I introduced myself, my role, and reason for consult. The patient reports that she is doing well today. The patient reports that she lives in the home with her husband. The patient reports that she was able to complete daily living task independently before being admitted. The patient reports that her husband drives her to medical appointments but she can drive her self.   The patient reports that she uses [employer]. The patient reports that she was supposed to have [facility] services during covid but they agency never come. The patient reports that she has never been admitted in a SNF. The patient reports that she has a cane, walker, and a built in handicap shower.   I informed the patient of the recommendation of SNF placement. The patient reports that she does not know about SNF or HH recommendation yet. The patient reports that she would like to see how she feels closer to D/C. The patient reports that she would like to be totally independent.  TOC will continues to follow the patient until D/C.     Expected Discharge Plan: Home w Home Health Services Barriers to Discharge: Continued Medical Work up               Expected Discharge Plan and Services       Living arrangements for the past 2 months: Single Family Home                                       Social Drivers of Health (SDOH) Interventions SDOH Screenings   Food Insecurity: Patient Unable To Answer (02/10/2024)  Housing: Patient Unable To Answer (02/10/2024)  Transportation Needs: Patient Unable To Answer (02/10/2024)  Utilities: Patient Unable To Answer (02/10/2024)   Financial Resource Strain: Low Risk  (11/02/2023)   Received from [facility]  Social Connections: Patient Unable To Answer (02/10/2024)  Tobacco Use: Low Risk  (02/08/2024)   Received from [facility]    Readmission Risk Interventions     No data to display

## 2024-02-15 NOTE — Progress Notes (Signed)
 Pt back from HD. VSS at this time

## 2024-02-15 NOTE — Progress Notes (Signed)
 Per Dr. Awanda pt may travel without RN. Pt off the unit for HD

## 2024-02-16 ENCOUNTER — Encounter: Admission: EM | Disposition: A | Payer: Self-pay | Source: Home / Self Care | Attending: Family Medicine

## 2024-02-16 DIAGNOSIS — Z538 Procedure and treatment not carried out for other reasons: Secondary | ICD-10-CM | POA: Diagnosis not present

## 2024-02-16 DIAGNOSIS — R092 Respiratory arrest: Secondary | ICD-10-CM

## 2024-02-16 DIAGNOSIS — N179 Acute kidney failure, unspecified: Secondary | ICD-10-CM | POA: Diagnosis not present

## 2024-02-16 LAB — BASIC METABOLIC PANEL WITH GFR
Anion gap: 14 (ref 5–15)
BUN: 40 mg/dL — ABNORMAL HIGH (ref 8–23)
CO2: 24 mmol/L (ref 22–32)
Calcium: 8.4 mg/dL — ABNORMAL LOW (ref 8.9–10.3)
Chloride: 94 mmol/L — ABNORMAL LOW (ref 98–111)
Creatinine, Ser: 2.35 mg/dL — ABNORMAL HIGH (ref 0.44–1.00)
GFR, Estimated: 21 mL/min — ABNORMAL LOW (ref >60)
Glucose, Bld: 124 mg/dL — ABNORMAL HIGH (ref 70–99)
Potassium: 3.9 mmol/L (ref 3.5–5.1)
Sodium: 132 mmol/L — ABNORMAL LOW (ref 135–145)

## 2024-02-16 LAB — BLOOD GAS, ARTERIAL
Acid-base deficit: 5.6 mmol/L — ABNORMAL HIGH (ref 0.0–2.0)
Bicarbonate: 19.3 mmol/L — ABNORMAL LOW (ref 20.0–28.0)
O2 Content: 6 L/min
O2 Saturation: 95.5 %
Patient temperature: 37
pCO2 arterial: 35 mmHg (ref 32–48)
pH, Arterial: 7.35 (ref 7.35–7.45)
pO2, Arterial: 78 mmHg — ABNORMAL LOW (ref 83–108)

## 2024-02-16 LAB — GLUCOSE, CAPILLARY
Glucose-Capillary: 140 mg/dL — ABNORMAL HIGH (ref 70–99)
Glucose-Capillary: 135 mg/dL — ABNORMAL HIGH (ref 70–99)
Glucose-Capillary: 143 mg/dL — ABNORMAL HIGH (ref 70–99)
Glucose-Capillary: 177 mg/dL — ABNORMAL HIGH (ref 70–99)
Glucose-Capillary: 158 mg/dL — ABNORMAL HIGH (ref 70–99)

## 2024-02-16 LAB — HEPATITIS B CORE ANTIBODY, TOTAL: HEP B CORE AB: NEGATIVE

## 2024-02-16 MED ORDER — CEFAZOLIN SODIUM-DEXTROSE 1-4 GM/50ML-% IV SOLN
INTRAVENOUS | Status: AC
  Filled 2024-02-16: qty 50

## 2024-02-16 MED ORDER — CEFAZOLIN SODIUM-DEXTROSE 1-4 GM/50ML-% IV SOLN: 1.0000 g | INTRAVENOUS | Status: DC

## 2024-02-16 MED ORDER — SODIUM CHLORIDE 0.9 % BOLUS PEDS
INTRAVENOUS | Status: AC | PRN
  Administered 2024-02-16: 250 mL via INTRAVENOUS

## 2024-02-16 MED ORDER — LIDOCAINE-EPINEPHRINE (PF) 1 %-1:200000 IJ SOLN
INTRAMUSCULAR | Status: DC | PRN
  Administered 2024-02-16: 20 mL

## 2024-02-16 MED ORDER — NALOXONE HCL 0.4 MG/ML IJ SOLN
INTRAMUSCULAR | Status: AC
  Filled 2024-02-16: qty 1

## 2024-02-16 MED ORDER — DIPHENHYDRAMINE HCL 50 MG/ML IJ SOLN: 50.0000 mg | Freq: Once | INTRAMUSCULAR | Status: DC | PRN

## 2024-02-16 MED ORDER — MIDAZOLAM HCL 2 MG/ML PO SYRP: 8.0000 mg | ORAL_SOLUTION | Freq: Once | ORAL | Status: DC | PRN

## 2024-02-16 MED ORDER — MIDAZOLAM HCL 2 MG/2ML IJ SOLN
INTRAMUSCULAR | Status: AC
  Filled 2024-02-16: qty 2

## 2024-02-16 MED ORDER — FLUMAZENIL 0.5 MG/5ML IV SOLN
INTRAVENOUS | Status: DC | PRN
  Administered 2024-02-16: .2 mg via INTRAVENOUS
  Administered 2024-02-16: .1 mg via INTRAVENOUS

## 2024-02-16 MED ORDER — FENTANYL CITRATE (PF) 100 MCG/2ML IJ SOLN
INTRAMUSCULAR | Status: DC | PRN
  Administered 2024-02-16: 50 ug via INTRAVENOUS

## 2024-02-16 MED ORDER — FENTANYL CITRATE (PF) 50 MCG/ML IJ SOSY
PREFILLED_SYRINGE | INTRAMUSCULAR | Status: AC
  Filled 2024-02-16: qty 1

## 2024-02-16 MED ORDER — HEPARIN (PORCINE) IN NACL 1000-0.9 UT/500ML-% IV SOLN
INTRAVENOUS | Status: DC | PRN
  Administered 2024-02-16: 500 mL

## 2024-02-16 MED ORDER — HEPARIN SODIUM (PORCINE) 10000 UNIT/ML IJ SOLN: INTRAMUSCULAR | Status: DC | PRN

## 2024-02-16 MED ORDER — METHYLPREDNISOLONE SODIUM SUCC 125 MG IJ SOLR: 125.0000 mg | Freq: Once | INTRAMUSCULAR | Status: DC | PRN

## 2024-02-16 MED ORDER — FAMOTIDINE 20 MG PO TABS: 40.0000 mg | ORAL_TABLET | Freq: Once | ORAL | Status: DC | PRN

## 2024-02-16 MED ORDER — FLUMAZENIL 0.5 MG/5ML IV SOLN
INTRAVENOUS | Status: AC
  Filled 2024-02-16: qty 5

## 2024-02-16 MED ORDER — SODIUM CHLORIDE 0.9 % IV SOLN: INTRAVENOUS | Status: DC

## 2024-02-16 MED ORDER — MIDAZOLAM HCL (PF) 2 MG/2ML IJ SOLN
INTRAMUSCULAR | Status: DC | PRN
  Administered 2024-02-16: 2 mg via INTRAVENOUS

## 2024-02-16 MED ORDER — NALOXONE HCL 0.4 MG/ML IJ SOLN
INTRAMUSCULAR | Status: DC | PRN
  Administered 2024-02-16 (×2): .4 mg via INTRAVENOUS

## 2024-02-16 NOTE — Progress Notes (Signed)
 [facility] CARDIOLOGY PROGRESS NOTE       Patient ID: HAYDN CUSH MRN: [id number] DOB/AGE: 1951/01/16 73 y.o.  Admit date: 02/09/2024 Referring Physician Inge Lecher, NP Primary Physician Sadie Manna, MD  Primary Cardiologist Dr. Ammon Reason for Consultation post-PEA arrest  HPI: TSERING LEAMAN is a 73 y.o. female  with a past medical history of chronic HFrEF, s/p CRT-P 07/2021, persistent atrial fibrillation, hypertension, type 2 diabetes who presented to the ED on 02/09/2024 for shortness of breath. CTA chest concerning for PNA. Overnight had PEA arrest x2 with ROSC. Cardiology was consulted for further evaluation.   Interval history: - Patient seen and examined this morning, resting in bed before permacath placement. - Remains on 2L [place]. Feels SOB is stable. - She denies any chest pain, palpitations. - No significant LE edema on exam.   Review of systems complete and found to be negative unless listed above    Past Medical History:  Diagnosis Date   Allergic genetic state    Atrial fibrillation (HCC) 2010   Cardiomyopathy (HCC)    CHF (congestive heart failure) (HCC)    Diabetes mellitus without complication (HCC)    Dysrhythmia    Gout    H/O cardiac catheterization    Hypertension    Joint pain    Osteopenia    Presence of permanent cardiac pacemaker    Psoriasis     Past Surgical History:  Procedure Laterality Date   ABDOMINAL HYSTERECTOMY  1985   BREAST EXCISIONAL BIOPSY Left    negative years ago   BREAST SURGERY Left 1997   papilloma   CARDIAC CATHETERIZATION Left 08/01/2015   Procedure: Left Heart Cath and Coronary Angiography;  Surgeon: Vinie DELENA Jude, MD;  Location: [facility];  Service: Cardiovascular;  Laterality: Left;   CHOLECYSTECTOMY  1986   COLONOSCOPY WITH PROPOFOL  N/A 08/26/2017   Procedure: COLONOSCOPY WITH PROPOFOL ;  Surgeon: Toledo, Ladell POUR, MD;  Location: [facility];  Service: Gastroenterology;  Laterality: N/A;    ESOPHAGOGASTRODUODENOSCOPY (EGD) WITH PROPOFOL  N/A 08/26/2017   Procedure: ESOPHAGOGASTRODUODENOSCOPY (EGD) WITH PROPOFOL ;  Surgeon: Toledo, Ladell POUR, MD;  Location: [facility];  Service: Gastroenterology;  Laterality: N/A;   ESOPHAGOGASTRODUODENOSCOPY (EGD) WITH PROPOFOL  N/A 07/04/2019   Procedure: ESOPHAGOGASTRODUODENOSCOPY (EGD) WITH PROPOFOL ;  Surgeon: Toledo, Ladell POUR, MD;  Location: [facility];  Service: Gastroenterology;  Laterality: N/A;   ESOPHAGOGASTRODUODENOSCOPY (EGD) WITH PROPOFOL  N/A 06/12/2023   Procedure: ESOPHAGOGASTRODUODENOSCOPY (EGD) WITH PROPOFOL ;  Surgeon: Maryruth Ole DASEN, MD;  Location: [facility] ENDOS[facility];  Service: Endoscopy;  Laterality: N/A;  DM   TUBAL LIGATION  1981    Medications Prior to Admission  Medication Sig Dispense Refill Last Dose/Taking   carvedilol  (COREG ) 12.5 MG tablet Take 1 tablet (12.5 mg total) by mouth 2 (two) times daily with a meal. 60 tablet 0 02/09/2024 Morning   gabapentin  (NEURONTIN ) 100 MG capsule Take 100 mg by mouth 2 (two) times daily.   02/09/2024 Morning   insulin  glargine (LANTUS  SOLOSTAR) 100 UNIT/ML Solostar Pen Inject 20 Units into the skin at bedtime.   02/08/2024   isosorbide  mononitrate (IMDUR ) 30 MG 24 hr tablet Take 1 tablet (30 mg total) by mouth daily. 30 tablet 0 02/09/2024   Magnesium  Gluconate 550 MG TABS Take by mouth.   Taking   RIVAROXABAN  (XARELTO ) VTE STARTER PACK (15 & 20 MG) Follow package directions: Take one 15mg  tablet by mouth twice a day. On day 22, switch to one 20mg  tablet once a day. Take with food.  51 each 0 02/09/2024 Morning   spironolactone  (ALDACTONE ) 25 MG tablet Take 1/2 tablet (12.5 mg total) by mouth daily. 30 tablet 0 02/09/2024   Vitamin D, Ergocalciferol, 2000 units CAPS Take 1 capsule by mouth daily.   Taking   Social History   Socioeconomic History   Marital status: Married    Spouse name: Not on file   Number of children: Not on file   Years of education: Not on file   Highest  education level: Not on file  Occupational History   Not on file  Tobacco Use   Smoking status: Never   Smokeless tobacco: Never  Vaping Use   Vaping status: Never Used  Substance and Sexual Activity   Alcohol use: No    Alcohol/week: 0.0 standard drinks of alcohol   Drug use: No   Sexual activity: Not on file    Comment: Married   Other Topics Concern   Not on file  Social History Narrative   Not on file   Social Drivers of Health   Financial Resource Strain: Low Risk  (11/02/2023)   Received from [facility]   Overall Financial Resource Strain (CARDIA)    Difficulty of Paying Living Expenses: Not hard at all  Food Insecurity: Patient Unable To Answer (02/10/2024)   Hunger Vital Sign    Worried About Running Out of Food in the Last Year: Patient unable to answer    Ran Out of Food in the Last Year: Patient unable to answer  Transportation Needs: Patient Unable To Answer (02/10/2024)   PRAPARE - Transportation    Lack of Transportation (Medical): Patient unable to answer    Lack of Transportation (Non-Medical): Patient unable to answer  Physical Activity: Not on file  Stress: Not on file  Social Connections: Patient Unable To Answer (02/10/2024)   Social Connection and Isolation Panel    Frequency of Communication with Friends and Family: Patient unable to answer    Frequency of Social Gatherings with Friends and Family: Patient unable to answer    Attends Religious Services: Patient unable to answer    Active Member of Clubs or Organizations: Patient unable to answer    Attends Banker Meetings: Patient unable to answer    Marital Status: Patient unable to answer  Intimate Partner Violence: Patient Unable To Answer (02/10/2024)   Humiliation, Afraid, Rape, and Kick questionnaire    Fear of Current or Ex-Partner: Patient unable to answer    Emotionally Abused: Patient unable to answer    Physically Abused: Patient unable to answer     Sexually Abused: Patient unable to answer    Family History  Problem Relation Age of Onset   Cancer Mother 72       breast   Diabetes Mother    Breast cancer Mother 24   Coronary artery disease Mother    Stroke Father    Coronary artery disease Father    Diabetes Son      Vitals:   02/16/24 0500 02/16/24 0759 02/16/24 0809 02/16/24 1006  BP:   (!) 137/93 (!) 146/99  Pulse:   93 93  Resp:    16  Temp:   (!) 97.1 F (36.2 C) (!) 97.4 F (36.3 C)  TempSrc:    Oral  SpO2:  97% 97% 95%  Weight: 69.6 kg     Height:        PHYSICAL EXAM General: Chronically ill-appearing elderly female.  No acute distress. HEENT: Normocephalic and  atraumatic. Neck: No JVD.  Lungs: Normal respiratory effort.  Mild bibasilar crackles. Heart: HRRR. Normal S1 and S2 without gallops or murmurs.  Abdomen: Non-distended appearing.  Msk: Normal strength and tone for age. Extremities: Warm and well perfused. No clubbing, cyanosis.  No edema.  Neuro: Alert and oriented X 3. Psych: Answers questions appropriately.   Labs: Basic Metabolic Panel: Recent Labs    02/14/24 0429 02/16/24 0500  NA 129* 132*  K 5.0 3.9  CL 91* 94*  CO2 20* 24  GLUCOSE 66* 124*  BUN 88* 40*  CREATININE 4.31* 2.35*  CALCIUM  8.7* 8.4*   Liver Function Tests: No results for input(s): AST, ALT, ALKPHOS, BILITOT, PROT, ALBUMIN in the last 72 hours.  No results for input(s): LIPASE, AMYLASE in the last 72 hours. CBC: Recent Labs    02/14/24 1415  WBC 7.1  HGB 12.5  HCT 37.4  MCV 85.2  PLT 163   Cardiac Enzymes: No results for input(s): CKTOTAL, CKMB, CKMBINDEX, TROPONINIHS in the last 72 hours. BNP: No results for input(s): BNP in the last 72 hours. D-Dimer: No results for input(s): DDIMER in the last 72 hours. Hemoglobin A1C: No results for input(s): HGBA1C in the last 72 hours. Fasting Lipid Panel: No results for input(s): CHOL, HDL, LDLCALC, TRIG, CHOLHDL,  LDLDIRECT in the last 72 hours. Thyroid Function Tests: No results for input(s): TSH, T4TOTAL, T3FREE, THYROIDAB in the last 72 hours.  Invalid input(s): FREET3 Anemia Panel: No results for input(s): VITAMINB12, FOLATE, FERRITIN, TIBC, IRON, RETICCTPCT in the last 72 hours.   Radiology: DG Abd 1 View Result Date: 02/10/2024 EXAM: 1 VIEW XRAY OF THE ABDOMEN 02/10/2024 04:44:00 AM COMPARISON: 02/10/2024 CLINICAL HISTORY: Encounter for imaging study to confirm orogastric (OG) tube placement FINDINGS: LINES, TUBES AND DEVICES: Gastric tube in place with side hole at the level of the gastric body. Cardiac pacemaker noted. BOWEL: Nonobstructive bowel gas pattern. SOFT TISSUES: Surgical clips in the right upper quadrant. No opaque urinary calculi. BONES: No acute osseous abnormality. LUNG BASES: Interstitial and patchy airspace opacities in the partially visualized lung bases, probably atelectasis. IMPRESSION: 1. Satisfactory enteric tube placement into the stomach. Electronically signed by: Helayne Hurst MD 02/10/2024 05:25 AM EST RP Workstation: [id number]   DG Abd 1 View Result Date: 02/10/2024 EXAM: 1 VIEW XRAY OF THE ABDOMEN 02/10/2024 04:05:00 AM COMPARISON: None available. CLINICAL HISTORY: Encounter for imaging study to confirm orogastric (OG) tube placement. FINDINGS: LINES, TUBES AND DEVICES: Enteric tube in place with tip and side port overlying the expected region of the gastric lumen. Cardiac pacemaker noted. BOWEL: Nonobstructive bowel gas pattern. SOFT TISSUES: No opaque urinary calculi. BONES: No acute osseous abnormality. LUNG BASES: Patchy airspace opacities in the partially visualized lung bases. IMPRESSION: 1. Enteric tube in appropriate position with tip and side port overlying the expected region of the gastric lumen. 2. Patchy airspace opacities in the partially visualized lung bases. Electronically signed by: Dorethia Molt MD 02/10/2024 04:25 AM EST RP  Workstation: [id number]   CT CHEST WO CONTRAST Result Date: 02/10/2024 EXAM: CT CHEST WITHOUT CONTRAST 02/10/2024 01:28:16 AM TECHNIQUE: CT of the chest was performed without the administration of intravenous contrast. Multiplanar reformatted images are provided for review. Automated exposure control, iterative reconstruction, and/or weight based adjustment of the mA/kV was utilized to reduce the radiation dose to as low as reasonably achievable. COMPARISON: Vertebrae 18 to 2025. CLINICAL HISTORY: CXR post cardiac arrest: predominantly within the right upper lobe, possibly representing acute pneumonic consolidation, pulmonary hemorrhage, or asymmetric  alveolar pulmonary edema as could be seen with acute mitral valve regurgitation. FINDINGS: MEDIASTINUM: Heart: Mild coronary artery calcification. Mild cardiomegaly. A left subclavian dual-lead pacemaker is identified with leads within the left ventricular venous outflow and right ventricle toward the apex. No pericardial effusion. Vessels: The central pulmonary arteries are enlarged in keeping with changes of pulmonary arterial hypertension. Mild atherosclerotic calcification within the thoracic aorta. No aortic aneurysm. Airways: The central airways are clear. Interval endotracheal tube placement attempts 2.6 cm above the carina. Other: Interval nasogastric tube placement extending into the stomach beyond the margin of the exam. Visualized thyroid is unremarkable. LYMPH NODES: Extensive mediastinal adenopathy is again identified and appears stable, nonspecific. This may be reactive, inflammatory as can be seen with conditions such as sarcoidosis, or lymphoproliferative in etiology. Comparison with interval examinations, if available, would be helpful in determining stability. If none are available, follow-up CT or PET CT examination would be helpful in 3 months to demonstrate stability or resolution. LUNGS AND PLEURA: Extensive asymmetric pulmonary ground-glass  infiltrate, multiple consolidation, and smooth interlobular septal thickening, asymmetrically more severe within the right upper lobe. Given its relatively rapid development since prior examination, the findings are most suggestive of asymmetric pulmonary edema, though pulmonary hemorrhage could appear similarly. Extensive pneumonic infiltrate or changes of ARDS are considered less likely given the relatively rapid development. Moderate to large right pleural effusion again noted with compressive atelectasis of the right lower lobe. No pneumothorax. SOFT TISSUES/BONES: Acute fracture of the left seventh rib anterolaterally with minimal displacement. No other acute bone abnormality identified. Osseous structures are otherwise age appropriate. UPPER ABDOMEN: Limited images of the upper abdomen demonstrates no acute abnormality. IMPRESSION: 1. Extensive asymmetric pulmonary ground-glass infiltrate, multifocal consolidation, and smooth interlobular septal thickening, greatest in the right upper lobe, most suggestive of asymmetric pulmonary edema given rapid interval development; acute pulmonary hemorrhage is a differential consideration. Extensive pneumonic infiltrate or ARDS considered less likely. 2. Moderate to large right pleural effusion with compressive atelectasis of the right lower lobe. 3. Acute minimally displaced fracture of the left anterolateral seventh rib. 4. Thoracic adenopathy as outlined above. This is nonspecific, but may be reactive, inflammatory as can be seen with conditions such as sarcoidosis, or lymphoproliferative in etiology. Comparison with interval examinations, if available, be helpful in determining stability. If none are available, follow-up CT or PET CT examination would be helpful in 3 months to demonstrate stability or resolution. 5. Findings consistent with pulmonary arterial hypertension (enlarged central pulmonary arteries). 6. Mild coronary artery calcification and mild  cardiomegaly. Electronically signed by: Dorethia Molt MD 02/10/2024 01:46 AM EST RP Workstation: [id number]   CT HEAD WO CONTRAST ( ) Result Date: 02/10/2024 EXAM: CT HEAD WITHOUT CONTRAST 02/10/2024 01:28:16 AM TECHNIQUE: CT of the head was performed without the administration of intravenous contrast. Automated exposure control, iterative reconstruction, and/or weight based adjustment of the mA/kV was utilized to reduce the radiation dose to as low as reasonably achievable. COMPARISON: None available. CLINICAL HISTORY: Mental status change, unknown cause. FINDINGS: BRAIN AND VENTRICLES: Cerebral ventricle sizes are concordant with the degree of cerebral volume loss.No acute hemorrhage. No evidence of acute infarct. No hydrocephalus. No extra-axial collection. No mass effect or midline shift. Residual contrast material within intracranial vasculature from recent contrast administration. ORBITS: Bilateral lens replacement. SINUSES: No acute abnormality. SOFT TISSUES AND SKULL: Partially imaged endotracheal tube in place. No acute soft tissue abnormality. No skull fracture. IMPRESSION: 1. No acute intracranial abnormality. Electronically signed by: Morgane Naveau MD 02/10/2024 01:40 AM  EST RP Workstation: [id number]   DG Chest Portable 1 View Result Date: 02/10/2024 EXAM: 1 VIEW(S) XRAY OF THE CHEST 02/10/2024 12:12:00 AM COMPARISON: 02/09/2024 CLINICAL HISTORY: post intubation FINDINGS: LINES, TUBES AND DEVICES: Endotracheal tube in place with tip 2.2 cm above the carina. Enteric tube in place, courses below the diaphragm. LUNGS AND PLEURA: Interval development of extensive airspace infiltrate within the right lung, predominantly within the right upper lobe. This may represent acute pneumonic consolidation, pulmonary hemorrhage or asymmetric alveolar pulmonary edema as could be seen with acute mitral valve regurgitation. Known right pleural effusion seen on CT examination of 02/09/2024 is not well  appreciated on this supine radiograph. No definite pneumothorax. HEART AND MEDIASTINUM: Similar mild cardiomegaly. Left chest cardiac pacemaker noted. Aortic arch atherosclerosis. BONES AND SOFT TISSUES: No acute osseous abnormality. IMPRESSION: 1. Interval development of extensive airspace infiltrate within the right lung, predominantly within the right upper lobe, possibly representing acute pneumonic consolidation, pulmonary hemorrhage, or asymmetric alveolar pulmonary edema as could be seen with acute mitral valve regurgitation. 2. Known right pleural effusion seen on CT examination of 02/09/2024 is not well appreciated on this supine radiograph. No definite pneumothorax. Electronically signed by: Dorethia Molt MD 02/10/2024 12:22 AM EST RP Workstation: [id number]   CT Angio Chest PE W and/or Wo Contrast Result Date: 02/09/2024 EXAM: CTA CHEST 02/09/2024 06:29:20 PM TECHNIQUE: CTA of the chest was performed without and with the administration of 75 mL of iohexol  (OMNIPAQUE ) 350 MG/ML injection. Multiplanar reformatted images are provided for review. MIP images are provided for review. Automated exposure control, iterative reconstruction, and/or weight based adjustment of the mA/kV was utilized to reduce the radiation dose to as low as reasonably achievable. COMPARISON: None available. CLINICAL HISTORY: Pulmonary embolism (PE) suspected, low to intermediate prob, positive D-dimer. FINDINGS: PULMONARY ARTERIES: Pulmonary arteries are adequately opacified for evaluation. No acute pulmonary embolus. Main pulmonary artery is normal in caliber. MEDIASTINUM: The heart and pericardium demonstrate no acute abnormality. There is no acute abnormality of the thoracic aorta. LYMPH NODES: Right hilar lymphadenopathy with, as an example, a 1.2 cm lymph node (5:54). Left hilar lymphadenopathy with, as an example, a 1.2 cm lymph node (5 x 15 x 4 mm). Mediastinal lymphadenopathy with a right paratracheal 1.1 cm lymph node  (5.32). No axillary lymphadenopathy. LUNGS AND PLEURA: At least a small volume right pleural effusion. No left pleural effusion. No pneumothorax. Basal atelectasis of the right lower lobe. Diffuse bronchial wall thickening with mucous plugging within the right lower lobe. Mucous plugging within the left lower lobe to a lesser extent. Question of developing small consolidation within the right lower lobe (6:88) with associated bronchial plugging. The trachea and mainstem bronchi are patent. Expiratory phase respiration. UPPER ABDOMEN: Limited images of the upper abdomen are unremarkable. SOFT TISSUES AND BONES: Left chest wall 2 lead cardiac device. No acute bone or soft tissue abnormality. IMPRESSION: 1. No pulmonary embolism. 2. Diffuse bronchial wall thickening with mucous plugging, greater in the right lower lobe, with probable developing small right lower lobe consolidation. 3. Small right pleural effusion. 4. Right hilar, left hilar, and mediastinal lymphadenopathy. Likely reactive in etiology. Recommend attention on follow-up. Electronically signed by: Morgane Naveau MD 02/09/2024 07:12 PM EST RP Workstation: [id number]   DG Chest 2 View Result Date: 02/09/2024 CLINICAL DATA:  Shortness of breath and pleural effusion. EXAM: CHEST - 2 VIEW COMPARISON:  Chest x-ray 01/29/2024.  Chest CT 01/24/2024. FINDINGS: There is a small right pleural effusion. Left lung is clear. No pneumothorax  or focal lung infiltrate. The cardiac silhouette is enlarged. Left-sided pacemaker is again seen. No acute fractures are identified. IMPRESSION: 1. Small right pleural effusion. 2. Cardiomegaly. Electronically Signed   By: Greig Pique M.D.   On: 02/09/2024 15:56   US  THORACENTESIS ASP PLEURAL SPACE W/IMG GUIDE Result Date: 01/29/2024 INDICATION: 73 year old female presents with shortness of breath, cough, and recurrent right pleural effusion. Received request for diagnostic and therapeutic thoracentesis. EXAM: ULTRASOUND  GUIDED RIGHT THORACENTESIS MEDICATIONS: 10 mL 1% lidocaine  COMPLICATIONS: None immediate. PROCEDURE: An ultrasound guided thoracentesis was thoroughly discussed with the patient and questions answered. The benefits, risks, alternatives and complications were also discussed. The patient understands and wishes to proceed with the procedure. Written consent was obtained. Ultrasound was performed to localize and mark an adequate pocket of fluid in the right chest. The area was then prepped and draped in the normal sterile fashion. 1% lidocaine  was used for local anesthesia. Under ultrasound guidance a 6 Fr Safe-T-Centesis catheter was introduced. Thoracentesis was performed. The catheter was removed and a dressing applied. FINDINGS: A total of approximately 700 mL of amber fluid was removed. Samples were sent to the laboratory as requested by the clinical team. IMPRESSION: Successful ultrasound guided right thoracentesis yielding 700 mL of pleural fluid. Performed by: Kristi Davenport, NP Electronically Signed   By: Cordella Banner   On: 01/29/2024 15:39   DG Chest Port 1 View Result Date: 01/29/2024 EXAM: 1 VIEW(S) XRAY OF THE CHEST 01/29/2024 11:40:00 AM COMPARISON: 01/29/2024 CLINICAL HISTORY: Recurrent right pleural effusion FINDINGS: LINES, TUBES AND DEVICES: Left chest pacemaker with leads terminating in the right atrium and coronary sinus. LUNGS AND PLEURA: No focal pulmonary opacity. No pulmonary edema. Near complete resolution of the right pleural effusion with minimal blunting of the right costophrenic angle. Improved aeration of right lung base. No pneumothorax. HEART AND MEDIASTINUM: Cardiomegaly. BONES AND SOFT TISSUES: No acute osseous abnormality. Cholecystectomy clips. IMPRESSION: 1. Near complete resolution of the right pleural effusion with minimal residual blunting of the right costophrenic angle and improved aeration of the right lung base. Electronically signed by: Rogelia Myers MD 01/29/2024  12:53 PM EST RP Workstation: [id number]   DG Chest 2 View Result Date: 01/29/2024 EXAM: 2 VIEW(S) XRAY OF THE CHEST 01/29/2024 06:46:00 AM COMPARISON: 01/26/2024 CLINICAL HISTORY: Pleural effusion on right. FINDINGS: LINES, TUBES AND DEVICES: Stable 2 lead left chest pacemaker. LUNGS AND PLEURA: Increased right base airspace disease. Small right pleural effusion, mildly increased. No pulmonary edema. No pneumothorax. HEART AND MEDIASTINUM: No acute abnormality of the cardiac and mediastinal silhouettes. BONES AND SOFT TISSUES: Upper abdominal surgical clips noted. No acute osseous abnormality. IMPRESSION: 1. Redeveloping small right pleural effusion with increased right base airspace disease, most likely atelectasis. 2. No pneumothorax. Electronically signed by: Rockey Kilts MD 01/29/2024 11:11 AM EST RP Workstation: [id number]   US  THORACENTESIS ASP PLEURAL SPACE W/IMG GUIDE Result Date: 01/26/2024 INDICATION: 73 year old female presents with shortness of breath. Received request for diagnostic and therapeutic thoracentesis. EXAM: ULTRASOUND GUIDED RIGHT THORACENTESIS MEDICATIONS: 8 mL 1% lidocaine  COMPLICATIONS: None immediate. PROCEDURE: An ultrasound guided thoracentesis was thoroughly discussed with the patient and questions answered. The benefits, risks, alternatives and complications were also discussed. The patient understands and wishes to proceed with the procedure. Written consent was obtained. Ultrasound was performed to localize and mark an adequate pocket of fluid in the right chest. The area was then prepped and draped in the normal sterile fashion. 1% lidocaine  was used for local anesthesia.  Under ultrasound guidance a 6 Fr Safe-T-Centesis catheter was introduced. Thoracentesis was performed. The catheter was removed and a dressing applied. FINDINGS: A total of approximately 900 mL of clear yellow fluid was removed. Samples were sent to the laboratory as requested by the clinical team.  IMPRESSION: Successful ultrasound guided right thoracentesis yielding 900 mL of pleural fluid. Performed by: Rayfield Buff, NP Electronically Signed   By: Cordella Banner   On: 01/26/2024 14:49   DG Chest Port 1 View Result Date: 01/26/2024 EXAM: 1 VIEW(S) XRAY OF THE CHEST 01/26/2024 01:02:00 PM COMPARISON: 01/25/2024 CLINICAL HISTORY: SOB (shortness of breath); Status post thoracentesis FINDINGS: LUNGS AND PLEURA: Evacuation of right pleural effusion with a small amount of residual fluid. Minimal overlying atelectasis. The left lung remains clear No post-procedural pneumothorax. No pulmonary edema. No pneumothorax. HEART AND MEDIASTINUM: Cardiomegaly with left subclavian approach cardiac rhythm maintenance device. Aortic arch atherosclerosis. BONES AND SOFT TISSUES: No acute osseous abnormality. IMPRESSION: 1. Evacuation of right pleural effusion with a small residual effusion and minimal overlying atelectasis. No pneumothorax. 2. No post-procedure or pneumothorax. Electronically signed by: Maude Stammer MD 01/26/2024 01:38 PM EST RP Workstation: [id number]   DG Chest Port 1 View Result Date: 01/25/2024 EXAM: 1 VIEW(S) XRAY OF THE CHEST 01/25/2024 09:41:07 AM COMPARISON: 01/24/2024 CLINICAL HISTORY: Shortness of breath FINDINGS: LUNGS AND PLEURA: Small right pleural effusion. Increased right lower and developing inferior right upper lobe airspace disease. Diffuse interstitial thickening. No pneumothorax. HEART AND MEDIASTINUM: Left subclavian dual lead pacemaker unchanged. Cardiomegaly. BONES AND SOFT TISSUES: No acute osseous abnormality. IMPRESSION: 1. Persistent small right pleural effusion with increased right lower and developing inferior right upper airspace disease, favoring pneumonia 2. Cardiomegaly with increased interstitial thickening, favoring mild pulmonary edema Electronically signed by: Rockey Kilts MD 01/25/2024 05:05 PM EST RP Workstation: [id number]   US  Venous Img Lower Bilateral  (DVT) Result Date: 01/24/2024 CLINICAL DATA:  Pulmonary embolism. EXAM: BILATERAL LOWER EXTREMITY VENOUS DOPPLER ULTRASOUND TECHNIQUE: Gray-scale sonography with compression, as well as color and duplex ultrasound, were performed to evaluate the deep venous system(s) from the level of the common femoral vein through the popliteal and proximal calf veins. COMPARISON:  None Available. FINDINGS: VENOUS Normal compressibility of the common femoral, superficial femoral, and popliteal veins, as well as the visualized calf veins. Visualized portions of profunda femoral vein and great saphenous vein unremarkable. No filling defects to suggest DVT on grayscale or color Doppler imaging. Doppler waveforms show normal direction of venous flow, normal respiratory plasticity and response to augmentation. Limited views of the contralateral common femoral vein are unremarkable. OTHER None. Limitations: none IMPRESSION: Negative. Electronically Signed   By: Leita Birmingham M.D.   On: 01/24/2024 14:19   CT Angio Chest PE W and/or Wo Contrast Addendum Date: 01/24/2024 ** ADDENDUM #1 ** ADDENDUM: The above findings were discussed with Dr. Levander at 9:53 am 01/24/24. ---------------------------------------------------- Electronically signed by: Evalene Coho MD 01/24/2024 10:49 AM EST RP Workstation: [id number]   Result Date: 01/24/2024 ** ORIGINAL REPORT ** EXAM: CTA of the Chest with contrast for PE 01/24/2024 09:25:18 AM TECHNIQUE: CTA of the chest was performed without and with the administration of 75 mL of iohexol  (OMNIPAQUE ) 350 MG/ML injection. Multiplanar reformatted images are provided for review. MIP images are provided for review. Automated exposure control, iterative reconstruction, and/or weight based adjustment of the mA/kV was utilized to reduce the radiation dose to as low as reasonably achievable. COMPARISON: PA and lateral radiographs of the chest dated 01/24/2024. CLINICAL HISTORY: Pulmonary embolism (  PE)  suspected, low to intermediate prob, positive D-dimer. FINDINGS: PULMONARY ARTERIES: Pulmonary arteries are adequately opacified for evaluation. There is nonocclusive thromboembolic disease present within the distal right pulmonary artery and within the right lower lobe artery and anterior segmental branch of the right lower lobe pulmonary artery. There is also thromboembolus within the posterior segmental artery. There is no definite thromboembolic disease demonstrated in the left lung. Main pulmonary artery is normal in caliber. MEDIASTINUM: The heart is enlarged. There is no evidence of right ventricular strain. There is mild calcific coronary artery disease. The thoracic aorta demonstrates mild-to-moderate calcific atheromatous disease. The ascending thoracic aorta measures approximately 3.3 cm in diameter. LYMPH NODES: There are numerous enlarged mediastinal lymph nodes. There is also right hilar lymphadenopathy. No axillary lymphadenopathy. LUNGS AND PLEURA: There is mild dependent atelectasis within the right lung. There is a moderate right-sided pleural effusion. No pneumothorax. No focal consolidation or pulmonary edema. UPPER ABDOMEN: Limited images of the upper abdomen are unremarkable. SOFT TISSUES AND BONES: No acute bone or soft tissue abnormality. IMPRESSION: 1. Nonocclusive pulmonary emboli involving the distal right pulmonary artery, right lower lobe artery, and anterior and posterior segmental branches; no left-sided PE identified. No CT evidence of right heart strain. 2. Moderate right pleural effusion. 3. Cardiomegaly with mild coronary artery calcifications and mild-to-moderate thoracic aortic atherosclerosis. 4. Enlarged mediastinal and right hilar lymph nodes; recommend clinical correlation and follow-up per oncologic/infectious/inflammatory considerations. Electronically signed by: Evalene Coho MD 01/24/2024 09:51 AM EST RP Workstation: [id number]   DG Chest 2 View Result Date:  01/24/2024 CLINICAL DATA:  Shortness of breath. EXAM: CHEST - 2 VIEW COMPARISON:  08/08/2020 FINDINGS: Mild cardiomegaly, with dual lead pacemaker in place. Small to moderate right pleural effusion. Right basilar atelectasis versus infiltrate. IMPRESSION: Small to moderate right pleural effusion, with right basilar atelectasis versus infiltrate. Electronically Signed   By: Norleen DELENA Kil M.D.   On: 01/24/2024 08:29    ECHO 02/10/24: 1. TDS.  2. Left ventricular ejection fraction, by estimation, is 30 to 35%. The left ventricle has moderately decreased function. The left ventricle demonstrates global hypokinesis. Left ventricular diastolic function could  not be evaluated.  3. Right ventricular systolic function is moderately reduced. The right  ventricular size is mildly enlarged. Mildly increased right ventricular  wall thickness.  4. Left atrial size was mildly dilated.  5. Right atrial size was mildly dilated.  6. The mitral valve is normal in structure. No evidence of mitral valve  regurgitation.  7. The aortic valve was not well visualized. Aortic valve regurgitation  is not visualized.   TELEMETRY (personally reviewed): VP rate 80-90s  EKG (personally reviewed): VP rate 69 bpm  Data reviewed by me 02/16/2024: last 24h vitals tele labs imaging I/O ED provider note, admission H&P, hospitalist progress note  Principal Problem:   CAP (community acquired pneumonia) Active Problems:   Encounter for dialysis and dialysis catheter care   Acute renal failure   Cardiac arrest (HCC)   Pressure injury of skin    ASSESSMENT AND PLAN:  JAHNASIA TATUM is a 73 y.o. female  with a past medical history of chronic HFrEF, s/p CRT-P 07/2021, persistent atrial fibrillation, hypertension, type 2 diabetes who presented to the ED on 02/09/2024 for shortness of breath. CTA chest concerning for PNA. Overnight had PEA arrest x2 with ROSC. Cardiology was consulted for further evaluation.   # PEA arrest #  Pneumonia # Acute on chronic HFrEF # S/p CRT-P 07/2021 # Persistent atrial fibrillation Patient  initially presented with shortness of breath, concern for pneumonia on CTA chest.  Overnight last night she had PEA arrest with ROSC then shortly afterwards arrested again.  Intubated in the ED.  Troponins mildly elevated and flat trending.  BNP significantly elevated at 15,900. EKG without acute ischemic changes.  Echo this admission with EF 30-35%, global hypokinesis, moderately reduced RV function with mild enlargement and mild RV thicknes. -Nephro managing need for HD. Appreciate their recommendations. -Continue carvedilol  3.125 mg twice daily. Will consider reintroducing other GDMT as able. -Mildly elevated and flat troponins most consistent with demand/supply mismatch and not ACS.  -Further management per PCCM.   This patient's plan of care was discussed and created with Dr. Custovic and she is in agreement.  Signed: Danita Bloch, PA-C  02/16/2024, 10:14 AM [facility]

## 2024-02-16 NOTE — H&P (View-Only) (Signed)
 Hospital Consult    Reason for Consult:  Dialysis Perma Catheter needed for outpatient Hemodialysis Requesting Physician:  Emmanuel Harris NP  MRN #:  [id number]  History of Present Illness: This is a 73 y.o. female with medical history significant of PAF on Eliquis , chronic HFrEF LVEF 25% with recurrent right-sided pleural effusion, PPM-ICD, HTN, IDDM, gout, presented with worsening of shortness of breath and leg swelling. Patient was admitted to [facility] for CHF and pulmonary edema related to end stage renal failure. Vascular Surgery consulted to place dialysis perma catheter.   Past Medical History:  Diagnosis Date   Allergic genetic state    Atrial fibrillation (HCC) 2010   Cardiomyopathy (HCC)    CHF (congestive heart failure) (HCC)    Diabetes mellitus without complication (HCC)    Dysrhythmia    Gout    H/O cardiac catheterization    Hypertension    Joint pain    Osteopenia    Presence of permanent cardiac pacemaker    Psoriasis     Past Surgical History:  Procedure Laterality Date   ABDOMINAL HYSTERECTOMY  1985   BREAST EXCISIONAL BIOPSY Left    negative years ago   BREAST SURGERY Left 1997   papilloma   CARDIAC CATHETERIZATION Left 08/01/2015   Procedure: Left Heart Cath and Coronary Angiography;  Surgeon: Vinie DELENA Jude, MD;  Location: [facility] INVASIVE CV [facility];  Service: Cardiovascular;  Laterality: Left;   CHOLECYSTECTOMY  1986   COLONOSCOPY WITH PROPOFOL  N/A 08/26/2017   Procedure: COLONOSCOPY WITH PROPOFOL ;  Surgeon: Toledo, Ladell POUR, MD;  Location: [facility] ENDOSCOPY;  Service: Gastroenterology;  Laterality: N/A;   ESOPHAGOGASTRODUODENOSCOPY (EGD) WITH PROPOFOL  N/A 08/26/2017   Procedure: ESOPHAGOGASTRODUODENOSCOPY (EGD) WITH PROPOFOL ;  Surgeon: Toledo, Ladell POUR, MD;  Location: [facility] ENDOSCOPY;  Service: Gastroenterology;  Laterality: N/A;   ESOPHAGOGASTRODUODENOSCOPY (EGD) WITH PROPOFOL  N/A 07/04/2019   Procedure: ESOPHAGOGASTRODUODENOSCOPY (EGD) WITH PROPOFOL ;  Surgeon:  Toledo, Ladell POUR, MD;  Location: [facility] ENDOSCOPY;  Service: Gastroenterology;  Laterality: N/A;   ESOPHAGOGASTRODUODENOSCOPY (EGD) WITH PROPOFOL  N/A 06/12/2023   Procedure: ESOPHAGOGASTRODUODENOSCOPY (EGD) WITH PROPOFOL ;  Surgeon: Maryruth Ole DASEN, MD;  Location: [facility] ENDOSCOPY;  Service: Endoscopy;  Laterality: N/A;  DM   TUBAL LIGATION  1981    Allergies  Allergen Reactions   Doxazosin Other (See Comments)    Cardura - cough   Doxycycline      Abdominal pain   Drug Class [Clindamycin/Lincomycin] Hives   Hydralazine  Hcl Other (See Comments)    gout   Metformin  And Related Swelling   Ciprofloxacin Other (See Comments)    Numbness in face   Iodine Other (See Comments)    NOT CT CONTRAST PER PT   Pioglitazone Other (See Comments)   Sacubitril-Valsartan Cough    Pt states that it gave her a cough and chest palpitations   Semaglutide Nausea Only   Sulfamethoxazole-Trimethoprim Hives   Augmentin [Amoxicillin-Pot Clavulanate] Diarrhea   Dulaglutide Nausea Only   Hctz [Hydrochlorothiazide] Other (See Comments)    Gout   Losartan Diarrhea   Poison Oak Extract Rash   Red Dye #40 (Allura Red) Rash    Prior to Admission medications   Medication Sig Start Date End Date Taking? Authorizing Provider  carvedilol  (COREG ) 12.5 MG tablet Take 1 tablet (12.5 mg total) by mouth 2 (two) times daily with a meal. 01/30/24  Yes Wieting, Richard, MD  gabapentin  (NEURONTIN ) 100 MG capsule Take 100 mg by mouth 2 (two) times daily. 09/28/23  Yes [provider]  insulin  glargine (LANTUS  SOLOSTAR)  100 UNIT/ML Solostar Pen Inject 20 Units into the skin at bedtime. 01/30/24 01/29/25 Yes Wieting, Richard, MD  isosorbide  mononitrate (IMDUR ) 30 MG 24 hr tablet Take 1 tablet (30 mg total) by mouth daily. 01/30/24  Yes Josette Ade, MD  Magnesium  Gluconate 550 MG TABS Take by mouth.   Yes [provider]  RIVAROXABAN  (XARELTO ) VTE STARTER PACK (15 & 20 MG) Follow package directions: Take one  15mg  tablet by mouth twice a day. On day 22, switch to one 20mg  tablet once a day. Take with food. 01/30/24  Yes Wieting, Richard, MD  spironolactone  (ALDACTONE ) 25 MG tablet Take 1/2 tablet (12.5 mg total) by mouth daily. 01/30/24  Yes Josette Ade, MD  Vitamin D, Ergocalciferol, 2000 units CAPS Take 1 capsule by mouth daily.   Yes [provider]    Social History   Socioeconomic History   Marital status: Married    Spouse name: Not on file   Number of children: Not on file   Years of education: Not on file   Highest education level: Not on file  Occupational History   Not on file  Tobacco Use   Smoking status: Never   Smokeless tobacco: Never  Vaping Use   Vaping status: Never Used  Substance and Sexual Activity   Alcohol use: No    Alcohol/week: 0.0 standard drinks of alcohol   Drug use: No   Sexual activity: Not on file    Comment: Married   Other Topics Concern   Not on file  Social History Narrative   Not on file   Social Drivers of Health   Financial Resource Strain: Low Risk  (11/02/2023)   Received from [facility]   Overall Financial Resource Strain (CARDIA)    Difficulty of Paying Living Expenses: Not hard at all  Food Insecurity: Patient Unable To Answer (02/10/2024)   Hunger Vital Sign    Worried About Programme Researcher, Broadcasting/film/video in the Last Year: Patient unable to answer    Ran Out of Food in the Last Year: Patient unable to answer  Transportation Needs: Patient Unable To Answer (02/10/2024)   PRAPARE - Transportation    Lack of Transportation (Medical): Patient unable to answer    Lack of Transportation (Non-Medical): Patient unable to answer  Physical Activity: Not on file  Stress: Not on file  Social Connections: Patient Unable To Answer (02/10/2024)   Social Connection and Isolation Panel    Frequency of Communication with Friends and Family: Patient unable to answer    Frequency of Social Gatherings with Friends and Family:  Patient unable to answer    Attends Religious Services: Patient unable to answer    Active Member of Clubs or Organizations: Patient unable to answer    Attends Banker Meetings: Patient unable to answer    Marital Status: Patient unable to answer  Intimate Partner Violence: Patient Unable To Answer (02/10/2024)   Humiliation, Afraid, Rape, and Kick questionnaire    Fear of Current or Ex-Partner: Patient unable to answer    Emotionally Abused: Patient unable to answer    Physically Abused: Patient unable to answer    Sexually Abused: Patient unable to answer     Family History  Problem Relation Age of Onset   Cancer Mother 59       breast   Diabetes Mother    Breast cancer Mother 83   Coronary artery disease Mother    Stroke Father    Coronary artery  disease Father    Diabetes Son     ROS: Otherwise negative unless mentioned in HPI  Physical Examination  Vitals:   02/16/24 0759 02/16/24 0809  BP:  (!) 137/93  Pulse:  93  Resp:    Temp:  (!) 97.1 F (36.2 C)  SpO2: 97% 97%   Body mass index is 28.99 kg/m.  General:  WDWN in NAD Gait: Not observed HENT: WNL, normocephalic Pulmonary: normal non-labored breathing, without Rales, with scattered rhonchi,  wheezing Cardiac: irregular, Hx of New Onset Atrial fibrillation, without  Murmurs, rubs or gallops; without carotid bruits Abdomen: Positive bowel sounds throughout, soft, NT/ND, no masses Skin: without rashes Vascular Exam/Pulses: Palpable Pulses throughout, allextremities are warm to touch.  Extremities: without ischemic changes, without Gangrene , without cellulitis; without open wounds;  Musculoskeletal: no muscle wasting or atrophy  Neurologic: A&O X 3;  No focal weakness or paresthesias are detected; speech is fluent/normal Psychiatric:  The pt has Normal affect. Lymph:  Unremarkable  CBC    Component Value Date/Time   WBC 7.1 02/14/2024 1415   RBC 4.39 02/14/2024 1415   HGB 12.5 02/14/2024  1415   HCT 37.4 02/14/2024 1415   PLT 163 02/14/2024 1415   MCV 85.2 02/14/2024 1415   MCH 28.5 02/14/2024 1415   MCHC 33.4 02/14/2024 1415   RDW 16.5 (H) 02/14/2024 1415   LYMPHSABS 1.2 08/12/2020 0508   MONOABS 0.7 08/12/2020 0508   EOSABS 0.0 08/12/2020 0508   BASOSABS 0.0 08/12/2020 0508    BMET    Component Value Date/Time   NA 132 (L) 02/16/2024 0500   K 3.9 02/16/2024 0500   CL 94 (L) 02/16/2024 0500   CO2 24 02/16/2024 0500   GLUCOSE 124 (H) 02/16/2024 0500   BUN 40 (H) 02/16/2024 0500   CREATININE 2.35 (H) 02/16/2024 0500   CALCIUM  8.4 (L) 02/16/2024 0500   GFRNONAA 21 (L) 02/16/2024 0500   GFRAA >60 06/26/2015 0601    COAGS: Lab Results  Component Value Date   INR 2.5 (H) 02/10/2024   INR 1.7 (H) 01/24/2024   INR 2.0 (H) 08/12/2020     Non-Invasive Vascular Imaging:   None Ordered.  Statin:  No. Beta Blocker:  Yes.   Aspirin :  No. ACEI:  No. ARB:  No. CCB use:  No Other antiplatelets/anticoagulants:  Yes.   Xarelto  Started on11/10/2023 for new onset afib.    ASSESSMENT/PLAN: This is a 73 y.o. female with medical history significant of PAF on Eliquis , chronic HFrEF LVEF 25% with recurrent right-sided pleural effusion, PPM-ICD, HTN, IDDM, gout, presented with worsening of shortness of breath and leg swelling. Upon work up was in fluid overload with CHF exacerbation due to end stage renal disease. While in the ICU she had a temporary dialysis catheter placed. She has been receiving dialysis and doing well but will require long term outpatient dialysis and therefore need a Perma Catheter placed.   I had a long detailed discussion at the bedside this morning concerning the procedure, benefits, risks and complications. She verbalizes her understanding and wishes to proceed. I answered all her questions this morning. Patient has been NPO since midnight last night. She currently is on eliquis  and that was last given last evening. Patient is on droplet precautions  due to diagnosis of the flu 7 days ago.  Patients current HGB is 12.5/37.4  Bun/Creatinine is  40/2.35  -I discussed the case in detail with Dr [name] MD and he agrees with the plan.  [name] [facility] 02/16/2024 9:14 AM

## 2024-02-16 NOTE — Progress Notes (Addendum)
 Pt accepted at [facility] [address]) on TTS at 6:45am. Pt can start 02/23/2024 and will need to arrive at 6:30am for new pt paperwork.                    SABRA Suzen Satchel Dialysis Navigator [phone]

## 2024-02-16 NOTE — Op Note (Signed)
 Patient was brought to special procedures and placed in the supine position and prepped and draped for a tunneled catheter.  She was given 2 mg of Versed  +50 mcg of fentanyl  as conscious sedation and promptly sustained a respiratory arrest.  The actual procedure was never started.  Her O2 sats dipped into the mid 70s as the lowest.  She did not lose her pulse and her EKG showed she was paced essentially the entire time.  She was bagged with prompt return of her sats into the 90s.  She was given 0.4 mg of Narcan  with 0.2 mg of Romazicon  with some improvement.  Subsequently a second dose of 0.4 mg of Narcan  was given with 0.1 mg of Romazicon .  At this point we had return of spontaneous respiration with sustained sats in the mid 90s.  However her mental status is still somewhat diminished.  Given this event I have placed transfer orders to the ICU.  The permacath will be done at a later date.

## 2024-02-16 NOTE — Consult Note (Signed)
 Hospital Consult    Reason for Consult:  Dialysis Perma Catheter needed for outpatient Hemodialysis Requesting Physician:  Emmanuel Harris NP  MRN #:  [id number]  History of Present Illness: This is a 73 y.o. female with medical history significant of PAF on Eliquis , chronic HFrEF LVEF 25% with recurrent right-sided pleural effusion, PPM-ICD, HTN, IDDM, gout, presented with worsening of shortness of breath and leg swelling. Patient was admitted to [facility] for CHF and pulmonary edema related to end stage renal failure. Vascular Surgery consulted to place dialysis perma catheter.   Past Medical History:  Diagnosis Date   Allergic genetic state    Atrial fibrillation (HCC) 2010   Cardiomyopathy (HCC)    CHF (congestive heart failure) (HCC)    Diabetes mellitus without complication (HCC)    Dysrhythmia    Gout    H/O cardiac catheterization    Hypertension    Joint pain    Osteopenia    Presence of permanent cardiac pacemaker    Psoriasis     Past Surgical History:  Procedure Laterality Date   ABDOMINAL HYSTERECTOMY  1985   BREAST EXCISIONAL BIOPSY Left    negative years ago   BREAST SURGERY Left 1997   papilloma   CARDIAC CATHETERIZATION Left 08/01/2015   Procedure: Left Heart Cath and Coronary Angiography;  Surgeon: Vinie DELENA Jude, MD;  Location: [facility]IVE [facility];  Service: Cardiovascular;  Laterality: Left;   CHOLECYSTECTOMY  1986   COLONOSCOPY WITH PROPOFOL  N/A 08/26/2017   Procedure: COLONOSCOPY WITH PROPOFOL ;  Surgeon: Toledo, Ladell POUR, MD;  Location: [facility] ENDOSCOPY;  Service: Gastroenterology;  Laterality: N/A;   ESOPHAGOGASTRODUODENOSCOPY (EGD) WITH PROPOFOL  N/A 08/26/2017   Procedure: ESOPHAGOGASTRODUODENOSCOPY (EGD) WITH PROPOFOL ;  Surgeon: Toledo, Ladell POUR, MD;  Location: [facility] ENDOSCOPY;  Service: Gastroenterology;  Laterality: N/A;   ESOPHAGOGASTRODUODENOSCOPY (EGD) WITH PROPOFOL  N/A 07/04/2019   Procedure: ESOPHAGOGASTRODUODENOSCOPY (EGD) WITH PROPOFOL ;  Surgeon:  Toledo, Ladell POUR, MD;  Location: [facility] ENDOSCOPY;  Service: Gastroenterology;  Laterality: N/A;   ESOPHAGOGASTRODUODENOSCOPY (EGD) WITH PROPOFOL  N/A 06/12/2023   Procedure: ESOPHAGOGASTRODUODENOSCOPY (EGD) WITH PROPOFOL ;  Surgeon: Maryruth Ole DASEN, MD;  Location: [facility] ENDOSCOPY;  Service: Endoscopy;  Laterality: N/A;  DM   TUBAL LIGATION  1981    Allergies  Allergen Reactions   Doxazosin Other (See Comments)    Cardura - cough   Doxycycline      Abdominal pain   Drug Class [Clindamycin/Lincomycin] Hives   Hydralazine  Hcl Other (See Comments)    gout   Metformin  And Related Swelling   Ciprofloxacin Other (See Comments)    Numbness in face   Iodine Other (See Comments)    NOT CT CONTRAST PER PT   Pioglitazone Other (See Comments)   Sacubitril-Valsartan Cough    Pt states that it gave her a cough and chest palpitations   Semaglutide Nausea Only   Sulfamethoxazole-Trimethoprim Hives   Augmentin [Amoxicillin-Pot Clavulanate] Diarrhea   Dulaglutide Nausea Only   Hctz [Hydrochlorothiazide] Other (See Comments)    Gout   Losartan Diarrhea   Poison Oak Extract Rash   Red Dye #40 (Allura Red) Rash    Prior to Admission medications   Medication Sig Start Date End Date Taking? Authorizing Provider  carvedilol  (COREG ) 12.5 MG tablet Take 1 tablet (12.5 mg total) by mouth 2 (two) times daily with a meal. 01/30/24  Yes Wieting, Richard, MD  gabapentin  (NEURONTIN ) 100 MG capsule Take 100 mg by mouth 2 (two) times daily. 09/28/23  Yes [provider]  insulin  glargine (LANTUS  SOLOSTAR)  100 UNIT/ML Solostar Pen Inject 20 Units into the skin at bedtime. 01/30/24 01/29/25 Yes Wieting, Richard, MD  isosorbide  mononitrate (IMDUR ) 30 MG 24 hr tablet Take 1 tablet (30 mg total) by mouth daily. 01/30/24  Yes Josette Ade, MD  Magnesium  Gluconate 550 MG TABS Take by mouth.   Yes [provider]  RIVAROXABAN  (XARELTO ) VTE STARTER PACK (15 & 20 MG) Follow package directions: Take one  15mg  tablet by mouth twice a day. On day 22, switch to one 20mg  tablet once a day. Take with food. 01/30/24  Yes Wieting, Richard, MD  spironolactone  (ALDACTONE ) 25 MG tablet Take 1/2 tablet (12.5 mg total) by mouth daily. 01/30/24  Yes Josette Ade, MD  Vitamin D, Ergocalciferol, 2000 units CAPS Take 1 capsule by mouth daily.   Yes [provider]    Social History   Socioeconomic History   Marital status: Married    Spouse name: Not on file   Number of children: Not on file   Years of education: Not on file   Highest education level: Not on file  Occupational History   Not on file  Tobacco Use   Smoking status: Never   Smokeless tobacco: Never  Vaping Use   Vaping status: Never Used  Substance and Sexual Activity   Alcohol use: No    Alcohol/week: 0.0 standard drinks of alcohol   Drug use: No   Sexual activity: Not on file    Comment: Married   Other Topics Concern   Not on file  Social History Narrative   Not on file   Social Drivers of Health   Financial Resource Strain: Low Risk  (11/02/2023)   Received from [facility]   Overall Financial Resource Strain (CARDIA)    Difficulty of Paying Living Expenses: Not hard at all  Food Insecurity: Patient Unable To Answer (02/10/2024)   Hunger Vital Sign    Worried About Programme Researcher, Broadcasting/film/video in the Last Year: Patient unable to answer    Ran Out of Food in the Last Year: Patient unable to answer  Transportation Needs: Patient Unable To Answer (02/10/2024)   PRAPARE - Transportation    Lack of Transportation (Medical): Patient unable to answer    Lack of Transportation (Non-Medical): Patient unable to answer  Physical Activity: Not on file  Stress: Not on file  Social Connections: Patient Unable To Answer (02/10/2024)   Social Connection and Isolation Panel    Frequency of Communication with Friends and Family: Patient unable to answer    Frequency of Social Gatherings with Friends and Family:  Patient unable to answer    Attends Religious Services: Patient unable to answer    Active Member of Clubs or Organizations: Patient unable to answer    Attends Banker Meetings: Patient unable to answer    Marital Status: Patient unable to answer  Intimate Partner Violence: Patient Unable To Answer (02/10/2024)   Humiliation, Afraid, Rape, and Kick questionnaire    Fear of Current or Ex-Partner: Patient unable to answer    Emotionally Abused: Patient unable to answer    Physically Abused: Patient unable to answer    Sexually Abused: Patient unable to answer     Family History  Problem Relation Age of Onset   Cancer Mother 59       breast   Diabetes Mother    Breast cancer Mother 83   Coronary artery disease Mother    Stroke Father    Coronary artery  disease Father    Diabetes Son     ROS: Otherwise negative unless mentioned in HPI  Physical Examination  Vitals:   02/16/24 0759 02/16/24 0809  BP:  (!) 137/93  Pulse:  93  Resp:    Temp:  (!) 97.1 F (36.2 C)  SpO2: 97% 97%   Body mass index is 28.99 kg/m.  General:  WDWN in NAD Gait: Not observed HENT: WNL, normocephalic Pulmonary: normal non-labored breathing, without Rales, with scattered rhonchi,  wheezing Cardiac: irregular, Hx of New Onset Atrial fibrillation, without  Murmurs, rubs or gallops; without carotid bruits Abdomen: Positive bowel sounds throughout, soft, NT/ND, no masses Skin: without rashes Vascular Exam/Pulses: Palpable Pulses throughout, allextremities are warm to touch.  Extremities: without ischemic changes, without Gangrene , without cellulitis; without open wounds;  Musculoskeletal: no muscle wasting or atrophy  Neurologic: A&O X 3;  No focal weakness or paresthesias are detected; speech is fluent/normal Psychiatric:  The pt has Normal affect. Lymph:  Unremarkable  CBC    Component Value Date/Time   WBC 7.1 02/14/2024 1415   RBC 4.39 02/14/2024 1415   HGB 12.5 02/14/2024  1415   HCT 37.4 02/14/2024 1415   PLT 163 02/14/2024 1415   MCV 85.2 02/14/2024 1415   MCH 28.5 02/14/2024 1415   MCHC 33.4 02/14/2024 1415   RDW 16.5 (H) 02/14/2024 1415   LYMPHSABS 1.2 08/12/2020 0508   MONOABS 0.7 08/12/2020 0508   EOSABS 0.0 08/12/2020 0508   BASOSABS 0.0 08/12/2020 0508    BMET    Component Value Date/Time   NA 132 (L) 02/16/2024 0500   K 3.9 02/16/2024 0500   CL 94 (L) 02/16/2024 0500   CO2 24 02/16/2024 0500   GLUCOSE 124 (H) 02/16/2024 0500   BUN 40 (H) 02/16/2024 0500   CREATININE 2.35 (H) 02/16/2024 0500   CALCIUM  8.4 (L) 02/16/2024 0500   GFRNONAA 21 (L) 02/16/2024 0500   GFRAA >60 06/26/2015 0601    COAGS: Lab Results  Component Value Date   INR 2.5 (H) 02/10/2024   INR 1.7 (H) 01/24/2024   INR 2.0 (H) 08/12/2020     Non-Invasive Vascular Imaging:   None Ordered.  Statin:  No. Beta Blocker:  Yes.   Aspirin :  No. ACEI:  No. ARB:  No. CCB use:  No Other antiplatelets/anticoagulants:  Yes.   Xarelto  Started on11/10/2023 for new onset afib.    ASSESSMENT/PLAN: This is a 73 y.o. female with medical history significant of PAF on Eliquis , chronic HFrEF LVEF 25% with recurrent right-sided pleural effusion, PPM-ICD, HTN, IDDM, gout, presented with worsening of shortness of breath and leg swelling. Upon work up was in fluid overload with CHF exacerbation due to end stage renal disease. While in the ICU she had a temporary dialysis catheter placed. She has been receiving dialysis and doing well but will require long term outpatient dialysis and therefore need a Perma Catheter placed.   I had a long detailed discussion at the bedside this morning concerning the procedure, benefits, risks and complications. She verbalizes her understanding and wishes to proceed. I answered all her questions this morning. Patient has been NPO since midnight last night. She currently is on eliquis  and that was last given last evening. Patient is on droplet precautions  due to diagnosis of the flu 7 days ago.  Patients current HGB is 12.5/37.4  Bun/Creatinine is  40/2.35  -I discussed the case in detail with Dr [name] MD and he agrees with the plan.  [name] [facility] 02/16/2024 9:14 AM

## 2024-02-16 NOTE — Progress Notes (Signed)
 Requested to see pt for outpt HD needs at d/c. Met with pt bedside. Introduced self and explained role. Discussed HD options. Pt prefers [facility] ([address]  Referral submitted to [facility] for review.  Suzen Satchel Dialysis Navigator [phone]

## 2024-02-16 NOTE — Progress Notes (Addendum)
 Patient was brought to special procedures and placed in the supine position and prepped and draped for a tunneled catheter.  She was given 2 mg of Versed  +50 mcg of fentanyl  as conscious sedation and promptly sustained a respiratory arrest.  The actual procedure was never started.  Her O2 sats dipped into the mid 70s as the lowest.  She did not lose her pulse and her EKG showed she was paced essentially the entire time.  She was bagged with prompt return of her sats into the 90s.  She was given 0.4 mg of Narcan  with 0.2 mg of Romazicon  with some improvement.  Subsequently a second dose of 0.4 mg of Narcan  was given with 0.1 mg of Romazicon .  At this point we had return of spontaneous respiration with sustained sats in the mid 90s.  However her mental status is still somewhat diminished.  Given this event I have placed transfer orders to the ICU.  The permacath will be done at a later date.  I contacted her husband by phone to give him an update and let them know that she was being transferred to the ICU.

## 2024-02-16 NOTE — Progress Notes (Signed)
 Personal belongings including cell phone and charger, baby wipes, and card/flower from windowsill in room 252 taken to room ICU11 and RN made aware.

## 2024-02-16 NOTE — Progress Notes (Addendum)
 Telemetry called and report that patient had a 9 beat run of v-tach. Made MD aware. No new orders at this time.

## 2024-02-16 NOTE — Evaluation (Signed)
 Occupational Therapy Evaluation Patient Details Name: Deborah Dawson MRN: [id number] DOB: February 09, 1951 Today's Date: 02/16/2024   History of Present Illness   Pt is a 73 y/o F presenting to ED with c/o worsening dyspnea. Code blue x2 in ED, received 2 rounds of chest compressions and was subsequently intubated. MD assessment includes CAP, recurrent pleural effusions, mild hyperkalemia. Pt underwent thoracocentesis on 02/11/24, started HD on 02/14/24 . PMH significant for PAF, PE on Xarelto , chronic HFrEF LVEF 25%, recurrent R sided pleural effusion, HTN, IDDM, gout.     Clinical Impressions Chart reviewed to date, pt greeted semi supine in bed, alert and oriented x4. Re-evaluation completed. Pt functional status continues to improve including amb with RW with supervision-CGA, toilet transfer with supervision-CGA, toileting with supervision. Pt goal is to return home. Pt educated re: safer ADL/ completion with AE/DME. All questions answered within scope. Pt is left in bed, all needs met, OT will follow acutely.   Pt spo2 >90% on RA throughout. Pt on 1 L pre session for comfort per her report, placed on pt following session as well per her request.      If plan is discharge home, recommend the following:   A little help with walking and/or transfers;A little help with bathing/dressing/bathroom;Help with stairs or ramp for entrance;Assistance with cooking/housework     Functional Status Assessment   Patient has had a recent decline in their functional status and demonstrates the ability to make significant improvements in function in a reasonable and predictable amount of time.     Equipment Recommendations   BSC/3in1 (2WW)     Recommendations for Other Services         Precautions/Restrictions   Precautions Precautions: Fall Recall of Precautions/Restrictions: Intact Restrictions Weight Bearing Restrictions Per Provider Order: No     Mobility Bed Mobility Overal bed  mobility: Needs Assistance Bed Mobility: Supine to Sit, Sit to Supine     Supine to sit: Supervision, HOB elevated, Used rails Sit to supine: Supervision, HOB elevated, Used rails        Transfers Overall transfer level: Needs assistance Equipment used: Rolling walker (2 wheels) Transfers: Sit to/from Stand Sit to Stand: Supervision                  Balance Overall balance assessment: Needs assistance Sitting-balance support: Feet unsupported, Bilateral upper extremity supported Sitting balance-Leahy Scale: Good     Standing balance support: Bilateral upper extremity supported, During functional activity, Reliant on assistive device for balance Standing balance-Leahy Scale: Good                             ADL either performed or assessed with clinical judgement   ADL Overall ADL's : Needs assistance/impaired Eating/Feeding: Set up   Grooming: Supervision/safety;Standing Grooming Details (indicate cue type and reason): with RW at sink level, intermittent vcs for RW placement         Upper Body Dressing : Supervision/safety;Sitting;Set up       Toilet Transfer: Supervision/safety;Contact guard assist;Rolling walker (2 wheels);Regular Toilet;Ambulation;Grab bars   Toileting- Clothing Manipulation and Hygiene: Supervision/safety;Sitting/lateral lean Toileting - Clothing Manipulation Details (indicate cue type and reason): continent urine on toilet     Functional mobility during ADLs: Supervision/safety;Contact guard assist;Rolling walker (2 wheels) (approx 15' two attempts)       Vision Patient Visual Report: No change from baseline       Perception  Praxis         Pertinent Vitals/Pain Pain Assessment Pain Assessment: No/denies pain     Extremity/Trunk Assessment Upper Extremity Assessment Upper Extremity Assessment: Overall WFL for tasks assessed   Lower Extremity Assessment Lower Extremity Assessment: Generalized  weakness       Communication Communication Communication: No apparent difficulties   Cognition Arousal: Alert Behavior During Therapy: WFL for tasks assessed/performed Cognition: No apparent impairments                               Following commands: Intact       Cueing  General Comments   Cueing Techniques: Verbal cues  spo2 >90% on RA throughout, back on 1 L via [place] for comfort post session (pt was on 1 L via [place] at start of session)   Exercises Other Exercises Other Exercises: edu re role of OT, role of rehab, discharge recommendations, safe ADL completion with AE/DME   Shoulder Instructions      Home Living Family/patient expects to be discharged to:: Private residence Living Arrangements: Spouse/significant other Available Help at Discharge: Family;Available PRN/intermittently Type of Home: House Home Access: Stairs to enter [employer] of Steps: 5 Entrance Stairs-Rails: Right;Left Home Layout: Multi-level;Able to live on main level with bedroom/bathroom Alternate Level Stairs-Number of Steps: basement per chart she does not go down there typically   Bathroom Shower/Tub: Producer, Television/film/video: Standard Bathroom Accessibility: Yes   Home Equipment: Standard Walker;Cane - single point;Shower seat          Prior Functioning/Environment Prior Level of Function : Independent/Modified Independent                    OT Problem List: Decreased activity tolerance;Decreased knowledge of use of DME or AE;Decreased strength;Decreased safety awareness;Impaired balance (sitting and/or standing);Decreased cognition;Cardiopulmonary status limiting activity   OT Treatment/Interventions: Self-care/ADL training;Therapeutic exercise;Energy conservation;DME and/or AE instruction;Cognitive remediation/compensation;Therapeutic activities;Modalities;Patient/family education;Balance training      OT Goals(Current goals can be found in  the care plan section)   Acute Rehab OT Goals Patient Stated Goal: go home OT Goal Formulation: With patient Time For Goal Achievement: 03/01/24 Potential to Achieve Goals: Good   OT Frequency:       Co-evaluation PT/OT/SLP Co-Evaluation/Treatment: Yes Reason for Co-Treatment:  (potential tolerance)   OT goals addressed during session: ADL's and self-care      AM-PAC OT 6 Clicks Daily Activity     Outcome Measure Help from another person eating meals?: None Help from another person taking care of personal grooming?: None Help from another person toileting, which includes using toliet, bedpan, or urinal?: A Little Help from another person bathing (including washing, rinsing, drying)?: A Little Help from another person to put on and taking off regular upper body clothing?: None Help from another person to put on and taking off regular lower body clothing?: A Little 6 Click Score: 21   End of Session Equipment Utilized During Treatment: Rolling walker (2 wheels) Nurse Communication: Mobility status  Activity Tolerance: Patient tolerated treatment well Patient left: in bed;with call bell/phone within reach;with bed alarm set  OT Visit Diagnosis: Other abnormalities of gait and mobility (R26.89);Muscle weakness (generalized) (M62.81)                Time: 9163-9145 OT Time Calculation (min): 18 min Charges:  OT General Charges $OT Visit: 1 Visit OT Evaluation $OT Re-eval: 1 Re-eval  Therisa Sheffield, OTD  OTR/L  02/16/24, 11:03 AM

## 2024-02-16 NOTE — Progress Notes (Addendum)
 [facility] [place], [place] 02/16/24  Subjective:   Hospital day # 7  Patient reports worsening shortness of breath today.  Currently requiring oxygen supplementation by nasal cannula.    Patient sitting at side of bed Alert and oriented Preparing for breakfast Room air   Objective:  Vital signs in last 24 hours:  Temp:  [97.1 F (36.2 C)-98.7 F (37.1 C)] 97.4 F (36.3 C) (11/25 1006) Pulse Rate:  [76-93] 93 (11/25 1006) Resp:  [13-34] 16 (11/25 1006) BP: (96-150)/(59-117) 146/99 (11/25 1006) SpO2:  [88 %-100 %] 95 % (11/25 1006) Weight:  [69.6 kg-70.1 kg] 69.6 kg (11/25 0500)  Weight change: -3.7 kg Filed Weights   02/15/24 0453 02/15/24 1157 02/16/24 0500  Weight: 69.6 kg 70.1 kg 69.6 kg    Intake/Output:    Intake/Output Summary (Last 24 hours) at 02/16/2024 1042 Last data filed at 02/16/2024 0100 Gross per 24 hour  Intake 186.54 ml  Output 1000 ml  Net -813.46 ml     Physical Exam: General: NAD, sitting at bedside  HEENT Moist oral mucous membranes  Pulm/lungs basilar crackles, decreased breath sounds at right base  CVS/Heart Irregular, paced  Abdomen:  Soft, nontender, nondistended  Extremities: Trace edema  Neurologic: Alert, able to answer questions appropriately  Skin: Warm, dry  Access: Rt femoral HD temp cath       Basic Metabolic Panel:  Recent Labs  Lab 02/10/24 0213 02/11/24 0447 02/11/24 0500 02/11/24 1016 02/12/24 0601 02/12/24 2346 02/13/24 0325 02/13/24 1116 02/13/24 2018 02/14/24 0429 02/16/24 0500  NA 134* 137  --   --  130* 126* 128* 130* 130* 129* 132*  K 4.4 4.7  --  4.3 4.7 5.8* 5.4* 4.8 5.0 5.0 3.9  CL 102 100  --   --  94* 92* 90* 93* 92* 91* 94*  CO2 21* 23  --   --  20* 17* 18* 20* 19* 20* 24  GLUCOSE 270* 152*  --   --  74 137* 118* 117* 87 66* 124*  BUN 34* 40*  --   --  68* 79* 73* 84* 80* 88* 40*  CREATININE 1.44* 1.92*  --   --  2.95* 3.68* 3.83* 3.99* 4.15* 4.31* 2.35*  CALCIUM  9.2  8.9  --   --  8.6* 8.7* 8.8* 8.7* 8.5* 8.7* 8.4*  MG 2.7*  --  2.1 2.1 2.3 2.6*  --   --   --   --   --   PHOS 4.0 4.7*  --   --  4.8*  --   --   --   --   --   --      CBC: Recent Labs  Lab 02/10/24 0213 02/11/24 0500 02/12/24 0601 02/13/24 0630 02/14/24 1415  WBC 16.3* 11.1* 10.0 9.3 7.1  HGB 13.7 14.2 13.6 13.2 12.5  HCT 42.9 42.3 41.1 40.2 37.4  MCV 90.1 85.1 86.5 86.8 85.2  PLT 216 192 155 172 163      Lab Results  Component Value Date   HEPBSAG NON REACTIVE 02/14/2024      Microbiology:  Recent Results (from the past 240 hours)  Blood culture (routine x 2)     Status: None   Collection Time: 02/09/24  9:27 PM   Specimen: BLOOD  Result Value Ref Range Status   Specimen Description BLOOD BLOOD RIGHT ARM  Final   Special Requests   Final    BOTTLES DRAWN AEROBIC AND ANAEROBIC Blood Culture results may not  be optimal due to an inadequate volume of blood received in culture bottles   Culture   Final    NO GROWTH 5 DAYS Performed at [facility], [address] [place], [place] [address]    Report Status 02/14/2024 FINAL  Final  Blood culture (routine x 2)     Status: None   Collection Time: 02/09/24  9:27 PM   Specimen: BLOOD  Result Value Ref Range Status   Specimen Description BLOOD BLOOD LEFT ARM  Final   Special Requests   Final    BOTTLES DRAWN AEROBIC AND ANAEROBIC Blood Culture results may not be optimal due to an inadequate volume of blood received in culture bottles   Culture   Final    NO GROWTH 5 DAYS Performed at [facility], [address] [place], [place] [address]    Report Status 02/14/2024 FINAL  Final  MRSA Next Gen by PCR, Nasal     Status: None   Collection Time: 02/10/24  1:49 AM   Specimen: Nasal Mucosa; Nasal Swab  Result Value Ref Range Status   MRSA by PCR Next Gen NOT DETECTED NOT DETECTED Final    Comment: (NOTE) The GeneXpert MRSA Assay (FDA approved for NASAL specimens only), is one component of a  comprehensive MRSA colonization surveillance program. It is not intended to diagnose MRSA infection nor to guide or monitor treatment for MRSA infections. Test performance is not FDA approved in patients less than 16 years old. Performed at [facility], [address] [place], [place] [address]   Respiratory (~20 pathogens) panel by PCR     Status: Abnormal   Collection Time: 02/10/24  1:49 AM   Specimen: Nasopharyngeal Swab; Respiratory  Result Value Ref Range Status   Adenovirus NOT DETECTED NOT DETECTED Final   Coronavirus 229E NOT DETECTED NOT DETECTED Final    Comment: (NOTE) The Coronavirus on the Respiratory Panel, DOES NOT test for the novel  Coronavirus (2019 nCoV)    Coronavirus HKU1 NOT DETECTED NOT DETECTED Final   Coronavirus NL63 NOT DETECTED NOT DETECTED Final   Coronavirus OC43 NOT DETECTED NOT DETECTED Final   Metapneumovirus NOT DETECTED NOT DETECTED Final   Rhinovirus / Enterovirus NOT DETECTED NOT DETECTED Final   Influenza A NOT DETECTED NOT DETECTED Final   Influenza B NOT DETECTED NOT DETECTED Final   Parainfluenza Virus 1 NOT DETECTED NOT DETECTED Final   Parainfluenza Virus 2 NOT DETECTED NOT DETECTED Final   Parainfluenza Virus 3 DETECTED (A) NOT DETECTED Final   Parainfluenza Virus 4 NOT DETECTED NOT DETECTED Final   Respiratory Syncytial Virus NOT DETECTED NOT DETECTED Final   Bordetella pertussis NOT DETECTED NOT DETECTED Final   Bordetella Parapertussis NOT DETECTED NOT DETECTED Final   Chlamydophila pneumoniae NOT DETECTED NOT DETECTED Final   Mycoplasma pneumoniae NOT DETECTED NOT DETECTED Final    Comment: Performed at [facility], [address] [place], [place] [address]  Resp panel by RT-PCR (RSV, Flu A&B, Covid) Anterior Nasal Swab     Status: None   Collection Time: 02/10/24  3:17 AM   Specimen: Anterior Nasal Swab  Result Value Ref Range Status   SARS Coronavirus 2 by RT PCR NEGATIVE NEGATIVE Final    Comment:  (NOTE) SARS-CoV-2 target nucleic acids are NOT DETECTED.  The SARS-CoV-2 RNA is generally detectable in upper respiratory specimens during the acute phase of infection. The lowest concentration of SARS-CoV-2 viral copies this assay can detect is 138 copies/mL.  A negative result does not preclude SARS-Cov-2 infection and should not be used as the sole basis for treatment or other patient management decisions. A negative result may occur with  improper specimen collection/handling, submission of specimen other than nasopharyngeal swab, presence of viral mutation(s) within the areas targeted by this assay, and inadequate number of viral copies(<138 copies/mL). A negative result must be combined with clinical observations, patient history, and epidemiological information. The expected result is Negative.  Fact Sheet for Patients:  bloggercourse.com  Fact Sheet for Healthcare Providers:  seriousbroker.it  This test is no t yet approved or cleared by the [place]  FDA and  has been authorized for detection and/or diagnosis of SARS-CoV-2 by FDA under an Emergency Use Authorization (EUA). This EUA will remain  in effect (meaning this test can be used) for the duration of the COVID-19 declaration under Section 564(b)(1) of the Act, 21 U.S.C.section 360bbb-3(b)(1), unless the authorization is terminated  or revoked sooner.       Influenza A by PCR NEGATIVE NEGATIVE Final   Influenza B by PCR NEGATIVE NEGATIVE Final    Comment: (NOTE) The Xpert Xpress SARS-CoV-2/FLU/RSV plus assay is intended as an aid in the diagnosis of influenza from Nasopharyngeal swab specimens and should not be used as a sole basis for treatment. Nasal washings and aspirates are unacceptable for Xpert Xpress SARS-CoV-2/FLU/RSV testing.  Fact Sheet for Patients: bloggercourse.com  Fact Sheet for Healthcare  Providers: seriousbroker.it  This test is not yet approved or cleared by the [place]  FDA and has been authorized for detection and/or diagnosis of SARS-CoV-2 by FDA under an Emergency Use Authorization (EUA). This EUA will remain in effect (meaning this test can be used) for the duration of the COVID-19 declaration under Section 564(b)(1) of the Act, 21 U.S.C. section 360bbb-3(b)(1), unless the authorization is terminated or revoked.     Resp Syncytial Virus by PCR NEGATIVE NEGATIVE Final    Comment: (NOTE) Fact Sheet for Patients: bloggercourse.com  Fact Sheet for Healthcare Providers: seriousbroker.it  This test is not yet approved or cleared by the [place]  FDA and has been authorized for detection and/or diagnosis of SARS-CoV-2 by FDA under an Emergency Use Authorization (EUA). This EUA will remain in effect (meaning this test can be used) for the duration of the COVID-19 declaration under Section 564(b)(1) of the Act, 21 U.S.C. section 360bbb-3(b)(1), unless the authorization is terminated or revoked.  Performed at [facility], [address] [place], [place] [address]   Culture, Respiratory w Gram Stain     Status: None   Collection Time: 02/10/24  7:56 AM   Specimen: Tracheal Aspirate; Respiratory  Result Value Ref Range Status   Specimen Description   Final    TRACHEAL ASPIRATE Performed at [facility], [address] [place], [place] [address]    Special Requests   Final    NONE Performed at [facility], [address] [place], [place] [address]    Gram Stain   Final    FEW WBC PRESENT,BOTH PMN AND MONONUCLEAR NO ORGANISMS SEEN    Culture   Final    NO GROWTH 2 DAYS Performed at [facility], [address], [place], [place] [address]    Report Status 02/12/2024 FINAL  Final  Body fluid culture w Gram Stain     Status: None    Collection Time: 02/11/24 12:25 PM   Specimen: Pleura; Body Fluid  Result Value Ref Range Status   Specimen Description  Final    PLEURAL Performed at [facility], [address] [place], [place] [address]    Special Requests   Final    NONE Performed at [facility], [address] [place], [place] [address]    Gram Stain   Final    WBC PRESENT, PREDOMINANTLY MONONUCLEAR NO ORGANISMS SEEN    Culture   Final    NO GROWTH 3 DAYS Performed at [facility], [address] [place], [place] [address]    Report Status 02/15/2024 FINAL  Final    Coagulation Studies: No results for input(s): LABPROT, INR in the last 72 hours.  Urinalysis: No results for input(s): COLORURINE, LABSPEC, PHURINE, GLUCOSEU, HGBUR, BILIRUBINUR, KETONESUR, PROTEINUR, UROBILINOGEN, NITRITE, LEUKOCYTESUR in the last 72 hours.  Invalid input(s): APPERANCEUR    Imaging: No results found.    Medications:    sodium chloride      [MAR Hold] ampicillin -sulbactam (UNASYN ) IV Stopped (02/16/24 0011)    ceFAZolin  (ANCEF ) IV      [MAR Hold] sodium chloride    Intravenous Once   [MAR Hold] apixaban   5 mg Oral BID   [MAR Hold] vitamin C   500 mg Oral BID   [MAR Hold] carvedilol   3.125 mg Oral BID WC   [MAR Hold] Chlorhexidine  Gluconate Cloth  6 each Topical Daily   [MAR Hold] feeding supplement  237 mL Oral BID BM   [MAR Hold] gabapentin   100 mg Oral BID   [MAR Hold] insulin  aspart  0-6 Units Subcutaneous TID WC   [MAR Hold] ipratropium-albuterol   3 mL Nebulization BID   [MAR Hold] lidocaine   1 patch Transdermal Q24H   [MAR Hold] multivitamin with minerals  1 tablet Oral Daily   [MAR Hold] sodium chloride  flush  3 mL Intravenous Q12H   [MAR Hold] acetaminophen , [MAR Hold] alum & mag hydroxide-simeth, [MAR Hold] benzonatate , diphenhydrAMINE , famotidine , [MAR Hold] hydrALAZINE , [MAR Hold] HYDROcodone  bit-homatropine, [MAR Hold] hydrocortisone  cream, [MAR  Hold] ipratropium-albuterol , [MAR Hold] menthol , methylPREDNISolone  (SOLU-MEDROL ) injection, midazolam , [MAR Hold] mouth rinse, [MAR Hold] mouth rinse, [MAR Hold] oxyCODONE , [MAR Hold] polyethylene glycol, [MAR Hold] prochlorperazine , [MAR Hold] promethazine , [MAR Hold] sodium chloride   Assessment/ Plan:  73 y.o. 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 Pneumonia is currently managed with IV Unasyn .  Plan: Renal US  negative for hydronephrosis. Urine output subpar Received dialysis yesterday,  UF achieved. Patient agreeable to proceed with permcath placement. Will hold breakfast and consult vascular for placement. Dialysis navigator will request outpatient dialysis center today. Will plan for dialysis tomorrow   Hyperkalemia Potassium 3.8   Acute metabolic acidosis Likely secondary to renal insufficiency. Will continue to monitor and manage with dialysis    Hyponatremia Likely secondary to AKI. Sodium slowly improving, 132   Chronic kidney disease, stage IIIb.  Baseline creatinine 1.4/GFR 38 from 02/10/2024.  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 Creatinine greatly improved, 2.35   Comorbidities Chronic systolic CHF-2D echo from 02/10/2024 show LVEF 30 to 35%, global hypokinesis, moderately reduced right ventricular systolic function   Acute  pulmonary embolism-patient is currently on apixaban  5 mg twice a day   Right  pleural effusion- Pulmonary and cardiac teams are following. Thoracentesis this admission       LOS: 7 [facility] 11/25/202510:42 AM  [facility] [place], [place] [phone]

## 2024-02-16 NOTE — Plan of Care (Signed)
  Problem: Education: Goal: Ability to describe self-care measures that may prevent or decrease complications (Diabetes Survival Skills Education) will improve Outcome: Progressing   Problem: Coping: Goal: Ability to adjust to condition or change in health will improve Outcome: Progressing   Problem: Fluid Volume: Goal: Ability to maintain a balanced intake and output will improve Outcome: Progressing   Problem: Health Behavior/Discharge Planning: Goal: Ability to manage health-related needs will improve Outcome: Progressing   Problem: Metabolic: Goal: Ability to maintain appropriate glucose levels will improve Outcome: Progressing   Problem: Skin Integrity: Goal: Risk for impaired skin integrity will decrease Outcome: Progressing   Problem: Respiratory: Goal: Ability to maintain a clear airway and adequate ventilation will improve Outcome: Progressing

## 2024-02-16 NOTE — Progress Notes (Signed)
 PROGRESS NOTE    Deborah Dawson  FMW:[id number] DOB: 1951-01-18 DOA: 02/09/2024 PCP: Sadie Manna, MD  [id number]/[id number]-[id number]  LOS: 7 days   Brief hospital course:   Assessment & Plan: Deborah Dawson is a 73 y.o. 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     Patient was in a usual state of health until earlier this month from 11/2 through 11/8 when she was admitted for acute on chronic systolic CHF with right-sided pleural effusion for which she underwent thoracentesis, on 11/4 to remove 900 cc and again on 11/7 to remove 700 cc.  At that time she also was found to have a small PE for which she was switched from Eliquis  to Xarelto .  Pt developed worsening shortness of breath about 5 days after leaving the hospital.     Pt was admitted by [facility].  While awaiting room placement in ED, patient suddenly became unresponsive.  When pulse checked patient found to be in PEA. She underwent 1 round of CPR and ACLS prior to ROSC, she remained obtunded and was emergently intubated for airway protection. Then about 20 minutes later became pulseless in PEA again receiving 1 round of CPR and ACLS prior to her return of ROSC.  Pt was transferred to [facility] team.  Pt was extubated on 11/20 and transferred back to [facility] on 11/21.  # PEA arrest x2 --unclear etiology.  Cardiology consulted. --monitor on tele  # Respiratory arrest --occurred today after receiving 2 mg of Versed  +50 mcg of fentanyl  as conscious sedation in preparation for PermCath placement.  Pt was given Narcan  and bagged.  Pt returned to spontaneous respiration and was transferred to ICU (for the 3rd time).  # Acute hypoxic respiratory failure  # Pulm edema # Moderate to large pleural effusion  --initial CTA chest showed Diffuse bronchial wall thickening with mucous plugging and then a  day later showed rapid development of pulm edema and right pleural effusion. --treated with diuresis and thoracentesis --received hypertonic saline neb BID with DuoNeb --HD for fluid removal  Parainfluenza virus 3 infection --cough and congestion --received hypertonic saline neb BID with DuoNeb --Tessalon  and hycodan PRN --supportive care  Possible PNA --completed 7 days of abx with Unasyn   # Acute on chronic Heart failure with reduced EF 20 to 25%  PPM-ICD --received IV lasix  then kidney function worsened. --hold further diuresis now due to AKI --cont coreg   AKI, oliguric Metabolic acidosis --likely due to to ATN secondary to hypotension, IV contrast exposure  --nephro consulted --s/p IV Na bicarb --started on HD on 11/23 --nephro plans for PermCath placement  #. Mild hyperkalemia --due to AKI --s/p veltassa  --HD to remove potassium   #.  History of PAF  - Continue carvedilol  --switched from Xarelto  to Eliquis  due to kidney function --cont Eliquis   Hx of PE --switched from Xarelto  to Eliquis  due to kidney function --cont Eliquis    #. DM2 Hypoglycemia --hypoglycemia due to poor oral intake --d/c'ed glargine --SSI very sensitive scale  Acute 7th rib fracture --Acute minimally displaced fracture of the left anterolateral seventh rib.  --from CPR during codes --pt doesn't want to take opioid pain meds. --tylenol  PRN   DVT prophylaxis: On:Eliquis  Code Status: Full code  Family Communication:  Level of care: ICU Dispo:   The patient is from: home Anticipated d/c is to: SNF rehab Anticipated d/c  date is: > 3 days   Subjective and Interval History:  Pt was planned for PermCath placement today, and had a respiratory arrest after given 2 mg of Versed  +50 mcg of fentanyl  as conscious sedation.  Pt did not lose her pulse.  Pt was given Narcan  and bagged.  Pt returned to spontaneous respiration and was transferred to ICU.   Objective: Vitals:   02/16/24 1815  02/16/24 1830 02/16/24 1845 02/16/24 1900  BP:  (!) 137/93    Pulse: (!) 102 95 94 89  Resp: 20 20 20 20   Temp:      TempSrc:      SpO2:      Weight:      Height:        Intake/Output Summary (Last 24 hours) at 02/16/2024 1918 Last data filed at 02/16/2024 1900 Gross per 24 hour  Intake 316.54 ml  Output --  Net 316.54 ml   Filed Weights   02/15/24 0453 02/15/24 1157 02/16/24 0500  Weight: 69.6 kg 70.1 kg 69.6 kg    Examination: seen after pt was moved to ICU.  Constitutional: NAD, AAOx3 HEENT: conjunctivae and lids normal, EOMI CV: No cyanosis.   RESP: normal respiratory effort, on 2L Extremities: No effusions, edema in BLE.  Left arm swollen.   SKIN: warm, dry Neuro: II - XII grossly intact.   Psych: Normal mood and affect.  Appropriate judgement and reason   Data Reviewed: I have personally reviewed labs and imaging studies  Time spent: 50 minutes  [name], MD [facility] If 7PM-7AM, please contact night-coverage 02/16/2024, 7:18 PM

## 2024-02-16 NOTE — Interval H&P Note (Signed)
 History and Physical Interval Note:  02/16/2024 10:09 AM  Deborah Dawson  has presented today for surgery, with the diagnosis of ESRD.  The various methods of treatment have been discussed with the patient and family. After consideration of risks, benefits and other options for treatment, the patient has consented to  Procedure(s): DIALYSIS/PERMA CATHETER INSERTION (N/A) as a surgical intervention.  The patient's history has been reviewed, patient examined, no change in status, stable for surgery.  I have reviewed the patient's chart and labs.  Questions were answered to the patient's satisfaction.     [name]

## 2024-02-16 NOTE — Evaluation (Addendum)
 Physical Therapy Evaluation Patient Details Name: Deborah Dawson MRN: [id number] DOB: [birth year] Today's Date: 02/16/2024  History of Present Illness  Pt is a 73 y/o F presenting to ED with c/o worsening dyspnea. Code blue x2 in ED, received 2 rounds of chest compressions and was subsequently intubated. MD assessment includes CAP, recurrent pleural effusions, mild hyperkalemia. Pt underwent thoracocentesis on 02/11/24, started HD on 02/14/24 . PMH significant for PAF, PE on Xarelto , chronic HFrEF LVEF 25%, recurrent R sided pleural effusion, HTN, IDDM, gout.   Clinical Impression  Re-evaluation completed. Patient is ambulatory on room air with Sp02 in the low 90's. She is fatigued with limited mobility but required no physical assistance with bed mobility, transfers, and ambulation today. Recommend rolling walker for safety with ambulation. Anticipate patient could return home with continued improvement in activity tolerance with support from family. PT will continue to follow.        If plan is discharge home, recommend the following: A lot of help with bathing/dressing/bathroom;Assist for transportation;Assistance with cooking/housework;Help with stairs or ramp for entrance   Can travel by private vehicle        Equipment Recommendations Rolling walker (2 wheels)  Recommendations for Other Services       Functional Status Assessment Patient has had a recent decline in their functional status and demonstrates the ability to make significant improvements in function in a reasonable and predictable amount of time.     Precautions / Restrictions Precautions Precautions: Fall Recall of Precautions/Restrictions: Intact Restrictions Weight Bearing Restrictions Per Provider Order: No      Mobility  Bed Mobility Overal bed mobility: Needs Assistance Bed Mobility: Supine to Sit, Sit to Supine     Supine to sit: Supervision, HOB elevated, Used rails Sit to supine: Supervision, HOB  elevated, Used rails        Transfers Overall transfer level: Needs assistance Equipment used: Rolling walker (2 wheels) Transfers: Sit to/from Stand Sit to Stand: Supervision                Ambulation/Gait Ambulation/Gait assistance: Contact guard assist, Supervision Gait Distance (Feet): 30 Feet Assistive device: Rolling walker (2 wheels) Gait Pattern/deviations: Step-through pattern Gait velocity: decreased     General Gait Details: slow but steady with rolling walker. activity tolerance limited by fatigue. encouraged rolling walker at this time for safety and fall prevention  Stairs            Wheelchair Mobility     Tilt Bed    Modified Rankin (Stroke Patients Only)       Balance Overall balance assessment: Needs assistance Sitting-balance support: Feet unsupported, Bilateral upper extremity supported Sitting balance-Leahy Scale: Good     Standing balance support: Bilateral upper extremity supported Standing balance-Leahy Scale: Fair Standing balance comment: able to briefly maintain standing without UE support to wash hands at sink                             Pertinent Vitals/Pain Pain Assessment Pain Assessment: No/denies pain    Home Living Family/patient expects to be discharged to:: Private residence Living Arrangements: Spouse/significant other Available Help at Discharge: Family;Available PRN/intermittently Type of Home: House Home Access: Stairs to enter Entrance Stairs-Rails: Right;Left Entrance Stairs-Number of Steps: 5 Alternate Level Stairs-Number of Steps: basement per chart she does not go down there typically Home Layout: Multi-level;Able to live on main level with bedroom/bathroom Home Equipment: Standard Walker;Cane - single point;Shower seat Additional  Comments: Handicapped shower; has basement but does not use it    Prior Function Prior Level of Function : Independent/Modified Independent                      Extremity/Trunk Assessment   Upper Extremity Assessment Upper Extremity Assessment: Overall WFL for tasks assessed    Lower Extremity Assessment Lower Extremity Assessment: Generalized weakness       Communication   Communication Communication: No apparent difficulties    Cognition Arousal: Alert Behavior During Therapy: WFL for tasks assessed/performed   PT - Cognitive impairments: No apparent impairments                         Following commands: Intact       Cueing Cueing Techniques: Verbal cues     General Comments General comments (skin integrity, edema, etc.): Sp02 in the 90's on room air with activity.  patient on 1 L02 at rest for comfort (per patient report)    Exercises     Assessment/Plan    PT Assessment Patient needs continued PT services  PT Problem List Decreased strength;Decreased range of motion;Decreased activity tolerance;Decreased balance;Decreased mobility       PT Treatment Interventions DME instruction;Gait training;Stair training;Functional mobility training;Therapeutic activities;Therapeutic exercise;Balance training;Neuromuscular re-education;Cognitive remediation;Patient/family education    PT Goals (Current goals can be found in the Care Plan section)  Acute Rehab PT Goals Patient Stated Goal: to go home PT Goal Formulation: With patient Time For Goal Achievement: 03/01/24 Potential to Achieve Goals: Fair    Frequency Min 2X/week     Co-evaluation   Reason for Co-Treatment: Complexity of the patient's impairments (multi-system involvement)   OT goals addressed during session: ADL's and self-care       AM-PAC PT 6 Clicks Mobility  Outcome Measure Help needed turning from your back to your side while in a flat bed without using bedrails?: None Help needed moving from lying on your back to sitting on the side of a flat bed without using bedrails?: A Little Help needed moving to and from a bed to a chair  (including a wheelchair)?: A Little Help needed standing up from a chair using your arms (e.g., wheelchair or bedside chair)?: A Little Help needed to walk in hospital room?: A Little Help needed climbing 3-5 steps with a railing? : A Little 6 Click Score: 19    End of Session   Activity Tolerance: Patient tolerated treatment well Patient left: in bed;with call bell/phone within reach;with bed alarm set Nurse Communication: Mobility status PT Visit Diagnosis: Muscle weakness (generalized) (M62.81);Unsteadiness on feet (R26.81)    Time: 9169-9145 PT Time Calculation (min) (ACUTE ONLY): 24 min   Charges:   PT Evaluation $PT Re-evaluation: 1 Re-eval   PT General Charges $$ ACUTE PT VISIT: 1 Visit         Randine Essex, PT, MPT  Randine LULLA Essex 02/16/2024, 11:29 AM

## 2024-02-17 ENCOUNTER — Encounter: Payer: Self-pay | Admitting: Vascular Surgery

## 2024-02-17 ENCOUNTER — Inpatient Hospital Stay

## 2024-02-17 DIAGNOSIS — I469 Cardiac arrest, cause unspecified: Secondary | ICD-10-CM | POA: Diagnosis not present

## 2024-02-17 DIAGNOSIS — Z4901 Encounter for fitting and adjustment of extracorporeal dialysis catheter: Secondary | ICD-10-CM

## 2024-02-17 DIAGNOSIS — N179 Acute kidney failure, unspecified: Secondary | ICD-10-CM | POA: Diagnosis not present

## 2024-02-17 DIAGNOSIS — J189 Pneumonia, unspecified organism: Secondary | ICD-10-CM | POA: Diagnosis not present

## 2024-02-17 LAB — CBC WITH DIFFERENTIAL/PLATELET
Abs Immature Granulocytes: 0.1 K/uL — ABNORMAL HIGH (ref 0.00–0.07)
Basophils Absolute: 0 K/uL (ref 0.0–0.1)
Basophils Relative: 1 %
Eosinophils Absolute: 0.1 K/uL (ref 0.0–0.5)
Eosinophils Relative: 2 %
HCT: 38.7 % (ref 36.0–46.0)
Hemoglobin: 12.7 g/dL (ref 12.0–15.0)
Immature Granulocytes: 1 %
Lymphocytes Relative: 14 %
Lymphs Abs: 1 K/uL (ref 0.7–4.0)
MCH: 28.7 pg (ref 26.0–34.0)
MCHC: 32.8 g/dL (ref 30.0–36.0)
MCV: 87.4 fL (ref 80.0–100.0)
Monocytes Absolute: 0.7 K/uL (ref 0.1–1.0)
Monocytes Relative: 10 %
Neutro Abs: 5.6 K/uL (ref 1.7–7.7)
Neutrophils Relative %: 72 %
Platelets: 178 K/uL (ref 150–400)
RBC: 4.43 MIL/uL (ref 3.87–5.11)
RDW: 17 % — ABNORMAL HIGH (ref 11.5–15.5)
Smear Review: NORMAL
WBC: 7.7 K/uL (ref 4.0–10.5)
nRBC: 4.8 % — ABNORMAL HIGH (ref 0.0–0.2)

## 2024-02-17 LAB — GLUCOSE, CAPILLARY
Glucose-Capillary: 122 mg/dL — ABNORMAL HIGH (ref 70–99)
Glucose-Capillary: 104 mg/dL — ABNORMAL HIGH (ref 70–99)
Glucose-Capillary: 138 mg/dL — ABNORMAL HIGH (ref 70–99)
Glucose-Capillary: 131 mg/dL — ABNORMAL HIGH (ref 70–99)
Glucose-Capillary: 121 mg/dL — ABNORMAL HIGH (ref 70–99)

## 2024-02-17 LAB — BASIC METABOLIC PANEL WITH GFR
Anion gap: 14 (ref 5–15)
BUN: 44 mg/dL — ABNORMAL HIGH (ref 8–23)
CO2: 23 mmol/L (ref 22–32)
Calcium: 8.5 mg/dL — ABNORMAL LOW (ref 8.9–10.3)
Chloride: 95 mmol/L — ABNORMAL LOW (ref 98–111)
Creatinine, Ser: 2.48 mg/dL — ABNORMAL HIGH (ref 0.44–1.00)
GFR, Estimated: 20 mL/min — ABNORMAL LOW (ref >60)
Glucose, Bld: 128 mg/dL — ABNORMAL HIGH (ref 70–99)
Potassium: 4 mmol/L (ref 3.5–5.1)
Sodium: 131 mmol/L — ABNORMAL LOW (ref 135–145)

## 2024-02-17 LAB — MAGNESIUM: Magnesium: 2.6 mg/dL — ABNORMAL HIGH (ref 1.7–2.4)

## 2024-02-17 LAB — RESP PANEL BY RT-PCR (RSV, FLU A&B, COVID)  RVPGX2
Influenza A by PCR: NEGATIVE
Influenza B by PCR: NEGATIVE
Resp Syncytial Virus by PCR: NEGATIVE
SARS Coronavirus 2 by RT PCR: NEGATIVE

## 2024-02-17 MED ORDER — RENA-VITE PO TABS
1.0000 | ORAL_TABLET | Freq: Every day | ORAL | Status: DC
  Administered 2024-02-17 – 2024-02-24 (×8): 1 via ORAL
  Filled 2024-02-17 (×8): qty 1

## 2024-02-17 MED ORDER — LIDOCAINE HCL (PF) 1 % IJ SOLN
10.0000 mL | Freq: Once | INTRAMUSCULAR | Status: AC
  Administered 2024-02-17: 10 mL via INTRADERMAL

## 2024-02-17 MED ORDER — HEPARIN SODIUM (PORCINE) 1000 UNIT/ML IJ SOLN
INTRAMUSCULAR | Status: AC
  Filled 2024-02-17: qty 4

## 2024-02-17 MED ORDER — FUROSEMIDE 10 MG/ML IJ SOLN
60.0000 mg | Freq: Two times a day (BID) | INTRAMUSCULAR | Status: DC
  Administered 2024-02-17 – 2024-02-19 (×6): 60 mg via INTRAVENOUS
  Filled 2024-02-17 (×3): qty 6
  Filled 2024-02-17: qty 8
  Filled 2024-02-17 (×2): qty 6

## 2024-02-17 MED ORDER — CARVEDILOL 6.25 MG PO TABS
6.2500 mg | ORAL_TABLET | Freq: Two times a day (BID) | ORAL | Status: DC
  Administered 2024-02-17 – 2024-02-23 (×12): 6.25 mg via ORAL
  Filled 2024-02-17 (×12): qty 1

## 2024-02-17 NOTE — Procedures (Signed)
 PROCEDURE SUMMARY:  Successful US  guided right thoracentesis. Yielded 1.2 liters of clear, yellow fluid. Pt tolerated procedure well. No immediate complications.  Specimen was sent for labs. CXR ordered.  EBL < 5 mL  Solmon Selmer Ku PA-C 02/17/2024 3:41 PM

## 2024-02-17 NOTE — Progress Notes (Signed)
 PROGRESS NOTE    Deborah Dawson  FMW:[id number] DOB: 04-19-1950 DOA: 02/09/2024 PCP: Sadie Manna, MD  Chief Complaint  Patient presents with   Shortness of Breath    Hospital Course:  Deborah Dawson 73 year old female with extensive past medical history including paroxysmal A-fib, prior pulmonary embolism, chronic heart failure with reduced EF LVEF 25%, recurrent right-sided pleural effusions, permanent pacemaker placement, hypertension, diabetes, who presented to the ED for evaluation of shortness of breath.  She was admitted earlier this month for acute on chronic systolic CHF with right-sided pleural effusion for which she underwent thoracentesis on 11/4, 900 cc removed.  She underwent additional thoracentesis 11/7 additional 700 cc removed.  At that time she was found have a small pulmonary embolism and she was switched from Eliquis  to Xarelto . This admission while patient was admitting room placement in the ED she became suddenly unresponsive and was found to be in PEA.  She underwent 1 round of CPR and ACLS prior to ROSC.  She then became obtunded and was emergently intubated for airway protection.  About 20 minutes after this event she went into PEA again and received another round of CPR and then achieved ROSC.  She was then transferred to the ICU.  She was extubated 11/20 and transferred back to [facility] on 11/21. Stay has been further complicated by severe AKI and metabolic acidosis.  Patient was started on hemodialysis on 11/23.  Patient underwent attempted permacath placement on 11/25.  During permacath placement she received Versed  and fentanyl  and subsequently underwent respiratory arrest.  She required Narcan  before returning to spontaneous respirations and then was transferred to the ICU.  Subjective: This morning patient was undergoing dialysis when she reported she felt unwell.  She aborted dialysis early. I evaluated the patient in the ICU after this event.  She reports she overall  feels unwell.  She denies any chest pain.  She does endorse nausea.  She is dyspneic. EKG ordered which shows possible LBBB, no ST elevations.  Patient denies any chest pain. Stat CXR reveals worsening right-sided pulmonary edema.  I have consulted interventional radiology to consider repeat thoracentesis   Objective: Vitals:   02/17/24 1011 02/17/24 1015 02/17/24 1030 02/17/24 1200  BP: (!) 146/88     Pulse:      Resp: 18 16 19    Temp: 97.6 F (36.4 C)   97.8 F (36.6 C)  TempSrc:    Oral  SpO2:    95%  Weight:      Height:        Intake/Output Summary (Last 24 hours) at 02/17/2024 1326 Last data filed at 02/17/2024 1119 Gross per 24 hour  Intake 613 ml  Output 0.5 ml  Net 612.5 ml   Filed Weights   02/16/24 0500 02/17/24 0442 02/17/24 0754  Weight: 69.6 kg 71.7 kg 71.7 kg    Examination: General exam: Toxic appearing, holding emesis bag, fatigued, dyspneic, frail Respiratory system: Increased work of breathing, dyspneic, tachypneic, crackles lower right lung Cardiovascular system: S1 & S2 heard, RRR.  Gastrointestinal system: Abdomen is nondistended, soft and nontender.  Neuro: Alert and oriented. No focal neurological deficits  Assessment & Plan:  Principal Problem:   CAP (community acquired pneumonia) Active Problems:   Acute renal failure   Encounter for dialysis and dialysis catheter care   Cardiac arrest (HCC)   Pressure injury of skin     PEA arrest x2 -Patient has extensive cardiac history including significantly reduced EF and PPM ICD in place. -  Continue monitoring on telemetry ICU - Cardiology consulted as below  Respiratory arrest - Occurred after receiving 2 mg Versed  and 50 mcg of fentanyl  as conscious sedation appropriation for permacath.  Patient was given Narcan  and bagged.  Return to spontaneous respiration. - Continue monitoring respiratory status closely in the ICU - Currently on nasal cannula and O2 sats are 99%  Acute hypoxic  respiratory failure  Pulm edema Moderate to large pleural effusion  -CT on arrival showed diffuse bronchial wall thickening with mucous plugging, later showed rapid development of pulmonary edema and pleural effusions - Has required thoracentesis twice this month - Still appears very dyspneic today - Repeat CXR showing moderate right-sided pleural effusion, increased since last imaging. - Will consult IR for repeat thoracentesis - Patient was unable to tolerate hemodialysis for the entire session.  This limits her volume removal - Continue to support respirations with saline nebs twice daily and DuoNebs -BiPAP available for respiratory support while awaiting thoracentesis, patient does have increased work of breathing   Parainfluenza virus 3 infection Continue supportive care with nebulizers, Tessalon    Possible PNA -Status post 7 days Unasyn  - CXR today with worsening pleural effusion and atelectasis.  Could be obscuring pneumonia.  No fever presently.  Stat CBC pending, if developing new leukocytosis will consider restarting antibiotics  Acute on chronic Heart failure with reduced EF 20 to 25%  PPM-ICD -Hemodialysis now for volume removal, the patient is not tolerating well - Will start high-dose Lasix  as well -EF this admission 30 to 35%, global hypokinesis   Oliguric AKI, has now progressed to ESRD Metabolic acidosis -May be secondary to ATN from hypotension as well as IV contrast exposure, unclear if she will make significant renal recovery.  Not tolerating dialysis very well - Nephrology consulted - Status post bicarb drip - Started on hemodialysis 11/23 - Attempted place permacath 11/25 but procedure was aborted due to respiratory arrest   Mild hyperkalemia Status post shifters - Continue close monitoring daily   History of PAF  - Continue carvedilol  --switched from Xarelto  to Eliquis  due to kidney function  Hx of PE --switched from Xarelto  to Eliquis  due to kidney  function --cont Eliquis    DM2 Hypoglycemia --hypoglycemia due to poor oral intake -- Sliding scale very sensitive.  Discontinue basal insulin   Severely deconditioned Poor prognosis - Patient is severely deconditioned and given heart failure, PEA arrest, respiratory arrest, and now requiring dialysis.  She has very poor functional reserve and nutrition is recommending Dobbhoff for nutrition.  At this time patient's respiratory status is tenuous and I would not recommend placing Dobbhoff as she is high risk for aspiration until her breathing improves. - Will consult palliative care for ongoing GOC conversations.   Acute 7th rib fracture --Acute minimally displaced fracture of the left anterolateral seventh rib.  --from CPR during codes --pt doesn't want to take opioid pain meds. --tylenol  PRN  Body mass index is 29.87 kg/m. - Outpatient follow up for lifestyle modification and risk factor management   DVT prophylaxis: Eliquis    Code Status: Full Code Disposition: Inpatient ICU, respiratory status tenuous.  Consultants:  Treatment Team:  Consulting Physician: Malka Domino, MD  Procedures:  attempted permacath 11/25  Antimicrobials:  Anti-infectives (From admission, onward)    Start     Dose/Rate Route Frequency Ordered Stop   02/16/24 0915  ceFAZolin  (ANCEF ) IVPB 1 g/50 mL premix  Status:  Discontinued        1 g 100 mL/hr over 30 Minutes Intravenous  30 min pre-op 02/16/24 0916 02/16/24 1232   02/11/24 1200  Ampicillin -Sulbactam (UNASYN ) 3 g in sodium chloride  0.9 % 100 mL IVPB  Status:  Discontinued        3 g 200 mL/hr over 30 Minutes Intravenous Every 12 hours 02/11/24 0749 02/16/24 1922   02/10/24 2230  azithromycin  (ZITHROMAX ) 500 mg in sodium chloride  0.9 % 250 mL IVPB  Status:  Discontinued        500 mg 250 mL/hr over 60 Minutes Intravenous Every 24 hours 02/10/24 0146 02/10/24 1016   02/10/24 0245  Ampicillin -Sulbactam (UNASYN ) 3 g in sodium chloride  0.9  % 100 mL IVPB  Status:  Discontinued        3 g 200 mL/hr over 30 Minutes Intravenous Every 6 hours 02/10/24 0158 02/11/24 0749   02/09/24 2115  cefTRIAXone  (ROCEPHIN ) 1 g in sodium chloride  0.9 % 100 mL IVPB        1 g 200 mL/hr over 30 Minutes Intravenous  Once 02/09/24 2108 02/09/24 2231   02/09/24 2115  azithromycin  (ZITHROMAX ) 500 mg in sodium chloride  0.9 % 250 mL IVPB        500 mg 250 mL/hr over 60 Minutes Intravenous  Once 02/09/24 2108 02/09/24 2336       Data Reviewed: I have personally reviewed following labs and imaging studies CBC: Recent Labs  Lab 02/11/24 0500 02/12/24 0601 02/13/24 0630 02/14/24 1415  WBC 11.1* 10.0 9.3 7.1  HGB 14.2 13.6 13.2 12.5  HCT 42.3 41.1 40.2 37.4  MCV 85.1 86.5 86.8 85.2  PLT 192 155 172 163   Basic Metabolic Panel: Recent Labs  Lab 02/11/24 0447 02/11/24 0500 02/11/24 1016 02/12/24 0601 02/12/24 2346 02/13/24 0325 02/13/24 1116 02/13/24 2018 02/14/24 0429 02/16/24 0500 02/17/24 0414  NA 137  --   --  130* 126*   < > 130* 130* 129* 132* 131*  K 4.7  --  4.3 4.7 5.8*   < > 4.8 5.0 5.0 3.9 4.0  CL 100  --   --  94* 92*   < > 93* 92* 91* 94* 95*  CO2 23  --   --  20* 17*   < > 20* 19* 20* 24 23  GLUCOSE 152*  --   --  74 137*   < > 117* 87 66* 124* 128*  BUN 40*  --   --  68* 79*   < > 84* 80* 88* 40* 44*  CREATININE 1.92*  --   --  2.95* 3.68*   < > 3.99* 4.15* 4.31* 2.35* 2.48*  CALCIUM  8.9  --   --  8.6* 8.7*   < > 8.7* 8.5* 8.7* 8.4* 8.5*  MG  --  2.1 2.1 2.3 2.6*  --   --   --   --   --  2.6*  PHOS 4.7*  --   --  4.8*  --   --   --   --   --   --   --    < > = values in this interval not displayed.   GFR: Estimated Creatinine Clearance: 17.8 mL/min (A) (by C-G formula based on SCr of 2.48 mg/dL (H)). Liver Function Tests: Recent Labs  Lab 02/11/24 0447  ALBUMIN 3.2*   CBG: Recent Labs  Lab 02/16/24 1656 02/16/24 2146 02/17/24 0733 02/17/24 1008 02/17/24 1133  GLUCAP 177* 158* 122* 104* 138*    Recent  Results (from the past 240 hours)  Blood culture (routine x  2)     Status: None   Collection Time: 02/09/24  9:27 PM   Specimen: BLOOD  Result Value Ref Range Status   Specimen Description BLOOD BLOOD RIGHT ARM  Final   Special Requests   Final    BOTTLES DRAWN AEROBIC AND ANAEROBIC Blood Culture results may not be optimal due to an inadequate volume of blood received in culture bottles   Culture   Final    NO GROWTH 5 DAYS Performed at [facility], [address], [place], [place] [address]    Report Status 02/14/2024 FINAL  Final  Blood culture (routine x 2)     Status: None   Collection Time: 02/09/24  9:27 PM   Specimen: BLOOD  Result Value Ref Range Status   Specimen Description BLOOD BLOOD LEFT ARM  Final   Special Requests   Final    BOTTLES DRAWN AEROBIC AND ANAEROBIC Blood Culture results may not be optimal due to an inadequate volume of blood received in culture bottles   Culture   Final    NO GROWTH 5 DAYS Performed at [facility], [address], [place], [place] [address]    Report Status 02/14/2024 FINAL  Final  MRSA Next Gen by PCR, Nasal     Status: None   Collection Time: 02/10/24  1:49 AM   Specimen: Nasal Mucosa; Nasal Swab  Result Value Ref Range Status   MRSA by PCR Next Gen NOT DETECTED NOT DETECTED Final    Comment: (NOTE) The GeneXpert MRSA Assay (FDA approved for NASAL specimens only), is one component of a comprehensive MRSA colonization surveillance program. It is not intended to diagnose MRSA infection nor to guide or monitor treatment for MRSA infections. Test performance is not FDA approved in patients less than 78 years old. Performed at [facility], [address] [place], [place] [address]   Respiratory (~20 pathogens) panel by PCR     Status: Abnormal   Collection Time: 02/10/24  1:49 AM   Specimen: Nasopharyngeal Swab; Respiratory  Result Value Ref Range Status   Adenovirus NOT DETECTED NOT DETECTED Final    Coronavirus 229E NOT DETECTED NOT DETECTED Final    Comment: (NOTE) The Coronavirus on the Respiratory Panel, DOES NOT test for the novel  Coronavirus (2019 nCoV)    Coronavirus HKU1 NOT DETECTED NOT DETECTED Final   Coronavirus NL63 NOT DETECTED NOT DETECTED Final   Coronavirus OC43 NOT DETECTED NOT DETECTED Final   Metapneumovirus NOT DETECTED NOT DETECTED Final   Rhinovirus / Enterovirus NOT DETECTED NOT DETECTED Final   Influenza A NOT DETECTED NOT DETECTED Final   Influenza B NOT DETECTED NOT DETECTED Final   Parainfluenza Virus 1 NOT DETECTED NOT DETECTED Final   Parainfluenza Virus 2 NOT DETECTED NOT DETECTED Final   Parainfluenza Virus 3 DETECTED (A) NOT DETECTED Final   Parainfluenza Virus 4 NOT DETECTED NOT DETECTED Final   Respiratory Syncytial Virus NOT DETECTED NOT DETECTED Final   Bordetella pertussis NOT DETECTED NOT DETECTED Final   Bordetella Parapertussis NOT DETECTED NOT DETECTED Final   Chlamydophila pneumoniae NOT DETECTED NOT DETECTED Final   Mycoplasma pneumoniae NOT DETECTED NOT DETECTED Final    Comment: Performed at [facility], [address] [place], [place] [address]  Resp panel by RT-PCR (RSV, Flu A&B, Covid) Anterior Nasal Swab     Status: None   Collection Time: 02/10/24  3:17 AM   Specimen: Anterior Nasal Swab  Result Value Ref Range Status  SARS Coronavirus 2 by RT PCR NEGATIVE NEGATIVE Final    Comment: (NOTE) SARS-CoV-2 target nucleic acids are NOT DETECTED.  The SARS-CoV-2 RNA is generally detectable in upper respiratory specimens during the acute phase of infection. The lowest concentration of SARS-CoV-2 viral copies this assay can detect is 138 copies/mL. A negative result does not preclude SARS-Cov-2 infection and should not be used as the sole basis for treatment or other patient management decisions. A negative result may occur with  improper specimen collection/handling, submission of specimen other than nasopharyngeal swab,  presence of viral mutation(s) within the areas targeted by this assay, and inadequate number of viral copies(<138 copies/mL). A negative result must be combined with clinical observations, patient history, and epidemiological information. The expected result is Negative.  Fact Sheet for Patients:  bloggercourse.com  Fact Sheet for Healthcare Providers:  seriousbroker.it  This test is no t yet approved or cleared by the [place]  FDA and  has been authorized for detection and/or diagnosis of SARS-CoV-2 by FDA under an Emergency Use Authorization (EUA). This EUA will remain  in effect (meaning this test can be used) for the duration of the COVID-19 declaration under Section 564(b)(1) of the Act, 21 U.S.C.section 360bbb-3(b)(1), unless the authorization is terminated  or revoked sooner.       Influenza A by PCR NEGATIVE NEGATIVE Final   Influenza B by PCR NEGATIVE NEGATIVE Final    Comment: (NOTE) The Xpert Xpress SARS-CoV-2/FLU/RSV plus assay is intended as an aid in the diagnosis of influenza from Nasopharyngeal swab specimens and should not be used as a sole basis for treatment. Nasal washings and aspirates are unacceptable for Xpert Xpress SARS-CoV-2/FLU/RSV testing.  Fact Sheet for Patients: bloggercourse.com  Fact Sheet for Healthcare Providers: seriousbroker.it  This test is not yet approved or cleared by the [place]  FDA and has been authorized for detection and/or diagnosis of SARS-CoV-2 by FDA under an Emergency Use Authorization (EUA). This EUA will remain in effect (meaning this test can be used) for the duration of the COVID-19 declaration under Section 564(b)(1) of the Act, 21 U.S.C. section 360bbb-3(b)(1), unless the authorization is terminated or revoked.     Resp Syncytial Virus by PCR NEGATIVE NEGATIVE Final    Comment: (NOTE) Fact Sheet for  Patients: bloggercourse.com  Fact Sheet for Healthcare Providers: seriousbroker.it  This test is not yet approved or cleared by the [place]  FDA and has been authorized for detection and/or diagnosis of SARS-CoV-2 by FDA under an Emergency Use Authorization (EUA). This EUA will remain in effect (meaning this test can be used) for the duration of the COVID-19 declaration under Section 564(b)(1) of the Act, 21 U.S.C. section 360bbb-3(b)(1), unless the authorization is terminated or revoked.  Performed at [facility], [address] [place], [place] [address]   Culture, Respiratory w Gram Stain     Status: None   Collection Time: 02/10/24  7:56 AM   Specimen: Tracheal Aspirate; Respiratory  Result Value Ref Range Status   Specimen Description   Final    TRACHEAL ASPIRATE Performed at [facility], [address] [place], [place] [address]    Special Requests   Final    NONE Performed at [facility], [address] [place], [place] [address]    Gram Stain   Final    FEW WBC PRESENT,BOTH PMN AND MONONUCLEAR NO ORGANISMS SEEN    Culture   Final    NO GROWTH 2 DAYS Performed at [facility],  [address] [place], [place] [address]    Report Status 02/12/2024 FINAL  Final  Body fluid culture w Gram Stain     Status: None   Collection Time: 02/11/24 12:25 PM   Specimen: Pleura; Body Fluid  Result Value Ref Range Status   Specimen Description   Final    PLEURAL Performed at [facility], [address] [place], [place] [address]    Special Requests   Final    NONE Performed at [facility], [address] [place], [place] [address]    Gram Stain   Final    WBC PRESENT, PREDOMINANTLY MONONUCLEAR NO ORGANISMS SEEN    Culture   Final    NO GROWTH 3 DAYS Performed at [facility], [address], [place], [place] [address]    Report Status  02/15/2024 FINAL  Final     Radiology Studies: DG Chest Port 1 View Result Date: 02/17/2024 EXAM: 1 VIEW(S) XRAY OF THE CHEST 02/17/2024 12:17:00 PM COMPARISON: 02/14/2024 CLINICAL HISTORY: Dyspnea FINDINGS: LINES, TUBES AND DEVICES: Left chest cardiac pacing device noted. LUNGS AND PLEURA: Moderate pleural effusion has increased since the recent radiograph. There are increased right mid lung and basilar opacities likely reflecting atelectasis versus infection. No pneumothorax. HEART AND MEDIASTINUM: Cardiomegaly, unchanged. Atherosclerotic calcifications. BONES AND SOFT TISSUES: No acute osseous abnormality. IMPRESSION: 1. Moderate right pleural effusion, increased since the recent radiograph. 2. Increased right mid lung and basilar opacities, likely reflecting atelectasis versus infection. Electronically signed by: Donnice Mania MD 02/17/2024 01:10 PM EST RP Workstation: [id number]   ABORTED INVASIVE LAB PROCEDURE Result Date: 02/16/2024 See surgical note for result.   Scheduled Meds:  sodium chloride    Intravenous Once   apixaban   5 mg Oral BID   vitamin C   500 mg Oral BID   carvedilol   6.25 mg Oral BID WC   Chlorhexidine  Gluconate Cloth  6 each Topical Daily   feeding supplement  237 mL Oral BID BM   insulin  aspart  0-6 Units Subcutaneous TID WC   ipratropium-albuterol   3 mL Nebulization BID   lidocaine   1 patch Transdermal Q24H   multivitamin  1 tablet Oral QHS   multivitamin with minerals  1 tablet Oral Daily   sodium chloride  flush  3 mL Intravenous Q12H   Continuous Infusions:   LOS: 8 days  CRITICAL CARETotal critical care time: 65 minutes Critical care time was exclusive of separately billable procedures and treating other patients. Critical care was necessary to treat or prevent imminent or life-threatening deterioration. Critical care was time spent personally by me on the following activities: development of treatment plan with patient and/or surrogate as well as nursing,  discussions with consultants, evaluation of patient's response to treatment, examination of patient, obtaining history from patient or surrogate, ordering and performing treatments and interventions, ordering and review of laboratory studies, ordering and review of radiographic studies, pulse oximetry and re-evaluation of patient's condition.   Burrel Legrand, DO [facility]  To contact the attending physician between 7A-7P please use Epic Chat. To contact the covering physician during after hours 7P-7A, please review Amion.  02/17/2024, 1:26 PM   *This document has been created with the assistance of dictation software. Please excuse typographical errors. *

## 2024-02-17 NOTE — Progress Notes (Addendum)
 Nutrition Follow Up Note   DOCUMENTATION CODES:   Not applicable  INTERVENTION:   Recommend dobhoff tube placement and nutrition support.   If tube able to be placed, recommend:  Osmolite 1.5@60ml /hr- Initiate at 72ml/hr and increase by 10ml/hr q8 hours until goal rate is reached.  ProSource TF 20- Give 60ml daily via tube, each supplement provides 80kcal and 20g of protein.   Free water  flushes 30ml q4 hours to maintain tube patency   Regimen provides 2240kcal/day, 110g/day protein and 1267ml/day of free water .   Rena-vit po daily   Thiamine 100mg  daily via tube x 7 days   Liberalize diet  Continue Ensure Plus High Protein po BID, each supplement provides 350 kcal and 20 grams of protein  Continue MVI po daily   Continue vitamin C  500mg  po BID   Pt remains at high refeed risk; recommend monitor potassium, magnesium  and phosphorus labs daily until stable  Daily weights   NUTRITION DIAGNOSIS:   Inadequate oral intake related to inability to eat (pt sedated and ventilated) as evidenced by NPO status. -resolving   GOAL:   Patient will meet greater than or equal to 90% of their needs -not met   MONITOR:   PO intake, Supplement acceptance, Labs, Weight trends, I & O's, Skin  ASSESSMENT:   73 y/o female with h/o HTN, AFib, nonischemic cardiomyopathy, PPM-ICD, CHF, PE, CHF, PAF, DM, hiatal hernia, hypertensive gastropathy, NAFLD, gout and CKD III who is admitted with CAP, pleural effusions with mediastinal lymphadenopathy, CHF exacerbation and AKI complicated by PEA arrest.  -Pt s/p thoracentesis 11/4 ( ), 11/7 ( ) & 11/20 ( )  Pt with poor appetite and oral intake in hospital. Pt is documented to be eating <30% of most meals. Pt is refusing most of the Ensure. Pt has now been without adequate nutrition for 6 days. Would recommend dobhoff tube placement and nutrition support. Pt is at high refeed risk. Pt s/p HD initiation 11/23. Per chart, pt is up ~3lbs  since admit and remains up ~10lbs from her UBW.   Medications reviewed and include: vitamin C , pepcid , insulin   Labs reviewed: Na 131(L), K 4.0 wnl, BUN 44(H), creat 2.48(H), Mg 2.6(H)  Diet Order:   Diet Order             Diet heart healthy/carb modified Room service appropriate? Yes; Fluid consistency: Thin  Diet effective now                  EDUCATION NEEDS:   No education needs have been identified at this time  Skin:  Skin Assessment: Reviewed RN Assessment (Stage I sacrum)  Last BM:  11/25- type 6  Height:   Ht Readings from Last 1 Encounters:  02/10/24 5' 1 (1.549 m)    Weight:   Wt Readings from Last 1 Encounters:  02/17/24 71.7 kg    Ideal Body Weight:  47.7 kg  BMI:  Body mass index is 29.87 kg/m.  Estimated Nutritional Needs:   Kcal:  1900-2200kcal/day  Protein:  95-110g/day  Fluid:  UOP +1L  Augustin Shams MS, RD, LDN If unable to be reached, please send secure chat to RD inpatient available from 8:00a-4:00p daily

## 2024-02-17 NOTE — Progress Notes (Signed)
 [facility] [place], [place] 02/17/24  Subjective:   Hospital day # 8  Patient reports worsening shortness of breath today.  Currently requiring oxygen supplementation by nasal cannula.    Patient seen and evaluated during dialysis   HEMODIALYSIS FLOWSHEET:  Blood Flow Rate (mL/min): 300 mL/min Arterial Pressure (mmHg): -122.21 mmHg Venous Pressure (mmHg): 146.86 mmHg TMP (mmHg): 3.84 mmHg Ultrafiltration Rate (mL/min): 251 mL/min Dialysate Flow Rate (mL/min): 300 ml/min  Tolerating treatment well Room air   Objective:  Vital signs in last 24 hours:  Temp:  [97.4 F (36.3 C)-97.8 F (36.6 C)] 97.6 F (36.4 C) (11/26 0754) Pulse Rate:  [0-117] 70 (11/26 0814) Resp:  [0-35] 19 (11/26 0900) BP: (62-182)/(35-145) 145/86 (11/26 0900) SpO2:  [82 %-100 %] 95 % (11/26 0730) Weight:  [71.7 kg] 71.7 kg (11/26 0754)  Weight change: 1.6 kg Filed Weights   02/16/24 0500 02/17/24 0442 02/17/24 0754  Weight: 69.6 kg 71.7 kg 71.7 kg    Intake/Output:    Intake/Output Summary (Last 24 hours) at 02/17/2024 0956 Last data filed at 02/17/2024 0200 Gross per 24 hour  Intake 620 ml  Output --  Net 620 ml     Physical Exam: General: NAD  HEENT Moist oral mucous membranes  Pulm/lungs decreased breath sounds at right base  CVS/Heart paced  Abdomen:  Soft, nontender, nondistended  Extremities: Trace edema  Neurologic: Alert, able to answer questions appropriately  Skin: Warm, dry  Access: Rt femoral HD temp cath       Basic Metabolic Panel:  Recent Labs  Lab 02/11/24 0447 02/11/24 0500 02/11/24 1016 02/12/24 0601 02/12/24 2346 02/13/24 0325 02/13/24 1116 02/13/24 2018 02/14/24 0429 02/16/24 0500 02/17/24 0414  NA 137  --   --  130* 126*   < > 130* 130* 129* 132* 131*  K 4.7  --  4.3 4.7 5.8*   < > 4.8 5.0 5.0 3.9 4.0  CL 100  --   --  94* 92*   < > 93* 92* 91* 94* 95*  CO2 23  --   --  20* 17*   < > 20* 19* 20* 24 23  GLUCOSE 152*  --    --  74 137*   < > 117* 87 66* 124* 128*  BUN 40*  --   --  68* 79*   < > 84* 80* 88* 40* 44*  CREATININE 1.92*  --   --  2.95* 3.68*   < > 3.99* 4.15* 4.31* 2.35* 2.48*  CALCIUM  8.9  --   --  8.6* 8.7*   < > 8.7* 8.5* 8.7* 8.4* 8.5*  MG  --  2.1 2.1 2.3 2.6*  --   --   --   --   --  2.6*  PHOS 4.7*  --   --  4.8*  --   --   --   --   --   --   --    < > = values in this interval not displayed.     CBC: Recent Labs  Lab 02/11/24 0500 02/12/24 0601 02/13/24 0630 02/14/24 1415  WBC 11.1* 10.0 9.3 7.1  HGB 14.2 13.6 13.2 12.5  HCT 42.3 41.1 40.2 37.4  MCV 85.1 86.5 86.8 85.2  PLT 192 155 172 163      Lab Results  Component Value Date   HEPBSAG NON REACTIVE 02/14/2024      Microbiology:  Recent Results (from the past 240 hours)  Blood culture (routine  x 2)     Status: None   Collection Time: 02/09/24  9:27 PM   Specimen: BLOOD  Result Value Ref Range Status   Specimen Description BLOOD BLOOD RIGHT ARM  Final   Special Requests   Final    BOTTLES DRAWN AEROBIC AND ANAEROBIC Blood Culture results may not be optimal due to an inadequate volume of blood received in culture bottles   Culture   Final    NO GROWTH 5 DAYS Performed at [facility], [address], [place], [place] [address]    Report Status 02/14/2024 FINAL  Final  Blood culture (routine x 2)     Status: None   Collection Time: 02/09/24  9:27 PM   Specimen: BLOOD  Result Value Ref Range Status   Specimen Description BLOOD BLOOD LEFT ARM  Final   Special Requests   Final    BOTTLES DRAWN AEROBIC AND ANAEROBIC Blood Culture results may not be optimal due to an inadequate volume of blood received in culture bottles   Culture   Final    NO GROWTH 5 DAYS Performed at [facility], [address], [place], [place] [address]    Report Status 02/14/2024 FINAL  Final  MRSA Next Gen by PCR, Nasal     Status: None   Collection Time: 02/10/24  1:49 AM   Specimen: Nasal Mucosa; Nasal Swab   Result Value Ref Range Status   MRSA by PCR Next Gen NOT DETECTED NOT DETECTED Final    Comment: (NOTE) The GeneXpert MRSA Assay (FDA approved for NASAL specimens only), is one component of a comprehensive MRSA colonization surveillance program. It is not intended to diagnose MRSA infection nor to guide or monitor treatment for MRSA infections. Test performance is not FDA approved in patients less than 2 years old. Performed at [facility], [address] [place], [place] [address]   Respiratory (~20 pathogens) panel by PCR     Status: Abnormal   Collection Time: 02/10/24  1:49 AM   Specimen: Nasopharyngeal Swab; Respiratory  Result Value Ref Range Status   Adenovirus NOT DETECTED NOT DETECTED Final   Coronavirus 229E NOT DETECTED NOT DETECTED Final    Comment: (NOTE) The Coronavirus on the Respiratory Panel, DOES NOT test for the novel  Coronavirus (2019 nCoV)    Coronavirus HKU1 NOT DETECTED NOT DETECTED Final   Coronavirus NL63 NOT DETECTED NOT DETECTED Final   Coronavirus OC43 NOT DETECTED NOT DETECTED Final   Metapneumovirus NOT DETECTED NOT DETECTED Final   Rhinovirus / Enterovirus NOT DETECTED NOT DETECTED Final   Influenza A NOT DETECTED NOT DETECTED Final   Influenza B NOT DETECTED NOT DETECTED Final   Parainfluenza Virus 1 NOT DETECTED NOT DETECTED Final   Parainfluenza Virus 2 NOT DETECTED NOT DETECTED Final   Parainfluenza Virus 3 DETECTED (A) NOT DETECTED Final   Parainfluenza Virus 4 NOT DETECTED NOT DETECTED Final   Respiratory Syncytial Virus NOT DETECTED NOT DETECTED Final   Bordetella pertussis NOT DETECTED NOT DETECTED Final   Bordetella Parapertussis NOT DETECTED NOT DETECTED Final   Chlamydophila pneumoniae NOT DETECTED NOT DETECTED Final   Mycoplasma pneumoniae NOT DETECTED NOT DETECTED Final    Comment: Performed at [facility], [address] [place], [place] [address]  Resp panel by RT-PCR (RSV, Flu A&B, Covid) Anterior Nasal Swab      Status: None   Collection Time: 02/10/24  3:17 AM   Specimen: Anterior Nasal Swab  Result Value Ref  Range Status   SARS Coronavirus 2 by RT PCR NEGATIVE NEGATIVE Final    Comment: (NOTE) SARS-CoV-2 target nucleic acids are NOT DETECTED.  The SARS-CoV-2 RNA is generally detectable in upper respiratory specimens during the acute phase of infection. The lowest concentration of SARS-CoV-2 viral copies this assay can detect is 138 copies/mL. A negative result does not preclude SARS-Cov-2 infection and should not be used as the sole basis for treatment or other patient management decisions. A negative result may occur with  improper specimen collection/handling, submission of specimen other than nasopharyngeal swab, presence of viral mutation(s) within the areas targeted by this assay, and inadequate number of viral copies(<138 copies/mL). A negative result must be combined with clinical observations, patient history, and epidemiological information. The expected result is Negative.  Fact Sheet for Patients:  bloggercourse.com  Fact Sheet for Healthcare Providers:  seriousbroker.it  This test is no t yet approved or cleared by the [place]  FDA and  has been authorized for detection and/or diagnosis of SARS-CoV-2 by FDA under an Emergency Use Authorization (EUA). This EUA will remain  in effect (meaning this test can be used) for the duration of the COVID-19 declaration under Section 564(b)(1) of the Act, 21 U.S.C.section 360bbb-3(b)(1), unless the authorization is terminated  or revoked sooner.       Influenza A by PCR NEGATIVE NEGATIVE Final   Influenza B by PCR NEGATIVE NEGATIVE Final    Comment: (NOTE) The Xpert Xpress SARS-CoV-2/FLU/RSV plus assay is intended as an aid in the diagnosis of influenza from Nasopharyngeal swab specimens and should not be used as a sole basis for treatment. Nasal washings and aspirates are  unacceptable for Xpert Xpress SARS-CoV-2/FLU/RSV testing.  Fact Sheet for Patients: bloggercourse.com  Fact Sheet for Healthcare Providers: seriousbroker.it  This test is not yet approved or cleared by the [place]  FDA and has been authorized for detection and/or diagnosis of SARS-CoV-2 by FDA under an Emergency Use Authorization (EUA). This EUA will remain in effect (meaning this test can be used) for the duration of the COVID-19 declaration under Section 564(b)(1) of the Act, 21 U.S.C. section 360bbb-3(b)(1), unless the authorization is terminated or revoked.     Resp Syncytial Virus by PCR NEGATIVE NEGATIVE Final    Comment: (NOTE) Fact Sheet for Patients: bloggercourse.com  Fact Sheet for Healthcare Providers: seriousbroker.it  This test is not yet approved or cleared by the [place]  FDA and has been authorized for detection and/or diagnosis of SARS-CoV-2 by FDA under an Emergency Use Authorization (EUA). This EUA will remain in effect (meaning this test can be used) for the duration of the COVID-19 declaration under Section 564(b)(1) of the Act, 21 U.S.C. section 360bbb-3(b)(1), unless the authorization is terminated or revoked.  Performed at [facility], [address] [place], [place] [address]   Culture, Respiratory w Gram Stain     Status: None   Collection Time: 02/10/24  7:56 AM   Specimen: Tracheal Aspirate; Respiratory  Result Value Ref Range Status   Specimen Description   Final    TRACHEAL ASPIRATE Performed at [facility], [address] [place], [place] [address]    Special Requests   Final    NONE Performed at [facility], [address] [place], [place] [address]    Gram Stain   Final    FEW WBC PRESENT,BOTH PMN AND MONONUCLEAR NO ORGANISMS SEEN    Culture   Final    NO GROWTH 2 DAYS Performed at [facility], [address], [place], [place] [address]    Report Status 02/12/2024 FINAL  Final  Body fluid culture w Gram Stain     Status: None   Collection Time: 02/11/24 12:25 PM   Specimen: Pleura; Body Fluid  Result Value Ref Range Status   Specimen Description   Final    PLEURAL Performed at [facility], [address] [place], [place] [address]    Special Requests   Final    NONE Performed at [facility], [address] [place], [place] [address]    Gram Stain   Final    WBC PRESENT, PREDOMINANTLY MONONUCLEAR NO ORGANISMS SEEN    Culture   Final    NO GROWTH 3 DAYS Performed at [facility], [address], [place], [place] [address]    Report Status 02/15/2024 FINAL  Final    Coagulation Studies: No results for input(s): LABPROT, INR in the last 72 hours.  Urinalysis: No results for input(s): COLORURINE, LABSPEC, PHURINE, GLUCOSEU, HGBUR, BILIRUBINUR, KETONESUR, PROTEINUR, UROBILINOGEN, NITRITE, LEUKOCYTESUR in the last 72 hours.  Invalid input(s): APPERANCEUR    Imaging: ABORTED INVASIVE LAB PROCEDURE Result Date: 02/16/2024 See surgical note for result.     Medications:      sodium chloride    Intravenous Once   apixaban   5 mg Oral BID   vitamin C   500 mg Oral BID   carvedilol   3.125 mg Oral BID WC   Chlorhexidine  Gluconate Cloth  6 each Topical Daily   feeding supplement  237 mL Oral BID BM   insulin  aspart  0-6 Units Subcutaneous TID WC   ipratropium-albuterol   3 mL Nebulization BID   lidocaine   1 patch Transdermal Q24H   multivitamin  1 tablet Oral QHS   multivitamin with minerals  1 tablet Oral Daily   sodium chloride  flush  3 mL Intravenous Q12H   acetaminophen , alum & mag hydroxide-simeth, benzonatate , hydrALAZINE , HYDROcodone  bit-homatropine, hydrocortisone  cream, ipratropium-albuterol , menthol , mouth rinse, mouth rinse, oxyCODONE , polyethylene glycol, prochlorperazine ,  promethazine , sodium chloride   Assessment/ Plan:  73 y.o. 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 Pneumonia is currently managed with IV Unasyn . Renal US  negative for hydronephrosis.  Plan: Receiving dialysis today Vascular attempted permcath placement yesterday, unfortunately patient experienced a respiratory arrest after sedation given. Procedure was aborted. Patient will need this access placed prior to discharge. Will coordinate with vascular surgery our options and plan accordingly.  Next treatment scheduled for Saturday, per outpatient clinic.  Dialysis coordinator has confirmed outpatient clinic as [facility] on a TTS.    Hyperkalemia Potassium remains stable   Acute metabolic acidosis Likely secondary to renal insufficiency. Managed with dialysis   Hyponatremia Likely secondary to AKI. Sodium 131 today.    Chronic kidney disease, stage IIIb.  Baseline creatinine 1.4/GFR 38 from 02/10/2024.  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 Will continue with dialysis and monitor for renal recovery   Comorbidities Chronic systolic CHF-2D echo from 02/10/2024 show LVEF 30 to 35%, global hypokinesis, moderately  reduced right ventricular systolic function   Acute pulmonary embolism-patient is currently on apixaban  5 mg twice a day   Right pleural effusion- Pulmonary and cardiac teams are following. Thoracentesis this  admission       LOS: 8 [facility] 11/26/20259:56 AM  [address] [place], [place] [phone]

## 2024-02-17 NOTE — Progress Notes (Signed)
 OT Cancellation Note  Patient Details Name: Deborah Dawson MRN: [id number] DOB: 1950/07/11   Cancelled Treatment:    Reason Eval/Treat Not Completed: Patient at procedure or test/ unavailable. Pt at dialysis. Will re-attempt OT tx at later date/time as pt is available.   Tobyn Osgood R., MPH, MS, OTR/L ascom [phone] 02/17/24, 8:21 AM

## 2024-02-17 NOTE — Progress Notes (Signed)
 0 ultrafiltration, condition stable , and report was given to the primary RN.

## 2024-02-17 NOTE — Progress Notes (Addendum)
 This RN gave 2 mg of versed  and 50 mcg of fentanyl  for conscious sedation per MD order for perm cath placement procedure. Oxygen saturation decreased a little at that time so staff asked her to take some deep breaths. Saturation came back to 90% 6L [place]. This RN got a non-rebreather if needed and made the decision to go ahead and use it. When applying the mask the patient's face was blue/gray and unresponsive. This RN  attempted to yell patient's name and stimulate with physical moving of shoulders. Documentation shows patient was given 0.4 mg Narcan  and 0.2 mg Romazicon . Patient began to breathe more on her own. A second dose of 0.4 Narcan  and 0.1 mg of Romazicon  was given. Two respiratory staff came and began using bag valve mask and sats came back to the 90s. Patient began to respond more at this point but remained mostly incoherent. Patient said she was hurting everywhere. MD placed transfer orders for patient to be moved to ICU. This RN was unable to record q 5 minute assessments for patient consciousness and pain due to critical state. See event log for vital signs and other documentation.

## 2024-02-17 NOTE — Plan of Care (Signed)
  Problem: Education: Goal: Ability to describe self-care measures that may prevent or decrease complications (Diabetes Survival Skills Education) will improve Outcome: Progressing   Problem: Metabolic: Goal: Ability to maintain appropriate glucose levels will improve Outcome: Progressing   Problem: Metabolic: Goal: Ability to maintain appropriate glucose levels will improve Outcome: Progressing   Problem: Skin Integrity: Goal: Risk for impaired skin integrity will decrease Outcome: Progressing   Problem: Role Relationship: Goal: Method of communication will improve Outcome: Progressing   Problem: Clinical Measurements: Goal: Cardiovascular complication will be avoided Outcome: Progressing   Problem: Elimination: Goal: Will not experience complications related to bowel motility Outcome: Progressing   Problem: Safety: Goal: Ability to remain free from injury will improve Outcome: Progressing

## 2024-02-17 NOTE — Progress Notes (Signed)
 [facility] CLINIC CARDIOLOGY PROGRESS NOTE       Patient ID: Deborah Dawson MRN: [id number] DOB/AGE: 1951/03/13 73 y.o.  Admit date: 02/09/2024 Referring Physician Inge Lecher, NP Primary Physician Sadie Manna, MD  Primary Cardiologist Dr. Ammon Reason for Consultation post-PEA arrest  HPI: Deborah Dawson is a 73 y.o. female  with a past medical history of chronic HFrEF, s/p CRT-P 07/2021, persistent atrial fibrillation, hypertension, type 2 diabetes who presented to the ED on 02/09/2024 for shortness of breath. CTA chest concerning for PNA. Overnight had PEA arrest x2 with ROSC. Cardiology was consulted for further evaluation.   Interval history: - Patient seen and examined this morning, resting in bed after HD this AM. - On 3L . SOB was worse before HD, now slightly better. - She denies any chest pain, palpitations. Reports some nausea this AM. - No significant LE edema on exam.   Review of systems complete and found to be negative unless listed above    Past Medical History:  Diagnosis Date   Allergic genetic state    Atrial fibrillation (HCC) 2010   Cardiomyopathy (HCC)    CHF (congestive heart failure) (HCC)    Diabetes mellitus without complication (HCC)    Dysrhythmia    Gout    H/O cardiac catheterization    Hypertension    Joint pain    Osteopenia    Presence of permanent cardiac pacemaker    Psoriasis     Past Surgical History:  Procedure Laterality Date   ABDOMINAL HYSTERECTOMY  1985   BREAST EXCISIONAL BIOPSY Left    negative years ago   BREAST SURGERY Left 1997   papilloma   CARDIAC CATHETERIZATION Left 08/01/2015   Procedure: Left Heart Cath and Coronary Angiography;  Surgeon: Vinie DELENA Jude, MD;  Location: [facility] INVASIVE CV LAB;  Service: Cardiovascular;  Laterality: Left;   CHOLECYSTECTOMY  1986   COLONOSCOPY WITH PROPOFOL  N/A 08/26/2017   Procedure: COLONOSCOPY WITH PROPOFOL ;  Surgeon: Toledo, Ladell POUR, MD;  Location: [facility] ENDOSCOPY;  Service:  Gastroenterology;  Laterality: N/A;   ESOPHAGOGASTRODUODENOSCOPY (EGD) WITH PROPOFOL  N/A 08/26/2017   Procedure: ESOPHAGOGASTRODUODENOSCOPY (EGD) WITH PROPOFOL ;  Surgeon: Toledo, Ladell POUR, MD;  Location: [facility] ENDOSCOPY;  Service: Gastroenterology;  Laterality: N/A;   ESOPHAGOGASTRODUODENOSCOPY (EGD) WITH PROPOFOL  N/A 07/04/2019   Procedure: ESOPHAGOGASTRODUODENOSCOPY (EGD) WITH PROPOFOL ;  Surgeon: Toledo, Ladell POUR, MD;  Location: [facility] ENDOSCOPY;  Service: Gastroenterology;  Laterality: N/A;   ESOPHAGOGASTRODUODENOSCOPY (EGD) WITH PROPOFOL  N/A 06/12/2023   Procedure: ESOPHAGOGASTRODUODENOSCOPY (EGD) WITH PROPOFOL ;  Surgeon: Maryruth Ole DASEN, MD;  Location: [facility] ENDOSCOPY;  Service: Endoscopy;  Laterality: N/A;  DM   TUBAL LIGATION  1981    Medications Prior to Admission  Medication Sig Dispense Refill Last Dose/Taking   carvedilol  (COREG ) 12.5 MG tablet Take 1 tablet (12.5 mg total) by mouth 2 (two) times daily with a meal. 60 tablet 0 02/09/2024 Morning   gabapentin  (NEURONTIN ) 100 MG capsule Take 100 mg by mouth 2 (two) times daily.   02/09/2024 Morning   insulin  glargine (LANTUS  SOLOSTAR) 100 UNIT/ML Solostar Pen Inject 20 Units into the skin at bedtime.   02/08/2024   isosorbide  mononitrate (IMDUR ) 30 MG 24 hr tablet Take 1 tablet (30 mg total) by mouth daily. 30 tablet 0 02/09/2024   Magnesium  Gluconate 550 MG TABS Take by mouth.   Taking   RIVAROXABAN  (XARELTO ) VTE STARTER PACK (15 & 20 MG) Follow package directions: Take one 15mg  tablet by mouth twice a day. On day 22, switch to  one 20mg  tablet once a day. Take with food. 51 each 0 02/09/2024 Morning   spironolactone  (ALDACTONE ) 25 MG tablet Take 1/2 tablet (12.5 mg total) by mouth daily. 30 tablet 0 02/09/2024   Vitamin D, Ergocalciferol, 2000 units CAPS Take 1 capsule by mouth daily.   Taking   Social History   Socioeconomic History   Marital status: Married    Spouse name: Not on file   Number of children: Not on file   Years of  education: Not on file   Highest education level: Not on file  Occupational History   Not on file  Tobacco Use   Smoking status: Never   Smokeless tobacco: Never  Vaping Use   Vaping status: Never Used  Substance and Sexual Activity   Alcohol use: No    Alcohol/week: 0.0 standard drinks of alcohol   Drug use: No   Sexual activity: Not on file    Comment: Married   Other Topics Concern   Not on file  Social History Narrative   Not on file   Social Drivers of Health   Financial Resource Strain: Low Risk  (11/02/2023)   Received from [facility]   Overall Financial Resource Strain (CARDIA)    Difficulty of Paying Living Expenses: Not hard at all  Food Insecurity: Patient Unable To Answer (02/10/2024)   Hunger Vital Sign    Worried About Running Out of Food in the Last Year: Patient unable to answer    Ran Out of Food in the Last Year: Patient unable to answer  Transportation Needs: Patient Unable To Answer (02/10/2024)   PRAPARE - Transportation    Lack of Transportation (Medical): Patient unable to answer    Lack of Transportation (Non-Medical): Patient unable to answer  Physical Activity: Not on file  Stress: Not on file  Social Connections: Patient Unable To Answer (02/10/2024)   Social Connection and Isolation Panel    Frequency of Communication with Friends and Family: Patient unable to answer    Frequency of Social Gatherings with Friends and Family: Patient unable to answer    Attends Religious Services: Patient unable to answer    Active Member of Clubs or Organizations: Patient unable to answer    Attends Banker Meetings: Patient unable to answer    Marital Status: Patient unable to answer  Intimate Partner Violence: Patient Unable To Answer (02/10/2024)   Humiliation, Afraid, Rape, and Kick questionnaire    Fear of Current or Ex-Partner: Patient unable to answer    Emotionally Abused: Patient unable to answer    Physically Abused:  Patient unable to answer    Sexually Abused: Patient unable to answer    Family History  Problem Relation Age of Onset   Cancer Mother 4       breast   Diabetes Mother    Breast cancer Mother 28   Coronary artery disease Mother    Stroke Father    Coronary artery disease Father    Diabetes Son      Vitals:   02/17/24 0900 02/17/24 0930 02/17/24 1000 02/17/24 1011  BP: (!) 145/86  114/81 (!) 146/88  Pulse:      Resp: 19 18 18 18   Temp:    97.6 F (36.4 C)  TempSrc:      SpO2:      Weight:      Height:        PHYSICAL EXAM General: Chronically ill-appearing elderly female.  No acute distress.  HEENT: Normocephalic and atraumatic. Neck: No JVD.  Lungs: Normal respiratory effort. Mild bibasilar crackles. Heart: HRRR. Normal S1 and S2 without gallops or murmurs.  Abdomen: Non-distended appearing.  Msk: Normal strength and tone for age. Extremities: Warm and well perfused. No clubbing, cyanosis.  No edema.  Neuro: Alert and oriented X 3. Psych: Answers questions appropriately.   Labs: Basic Metabolic Panel: Recent Labs    02/16/24 0500 02/17/24 0414  NA 132* 131*  K 3.9 4.0  CL 94* 95*  CO2 24 23  GLUCOSE 124* 128*  BUN 40* 44*  CREATININE 2.35* 2.48*  CALCIUM  8.4* 8.5*  MG  --  2.6*   Liver Function Tests: No results for input(s): AST, ALT, ALKPHOS, BILITOT, PROT, ALBUMIN in the last 72 hours.  No results for input(s): LIPASE, AMYLASE in the last 72 hours. CBC: Recent Labs    02/14/24 1415  WBC 7.1  HGB 12.5  HCT 37.4  MCV 85.2  PLT 163   Cardiac Enzymes: No results for input(s): CKTOTAL, CKMB, CKMBINDEX, TROPONINIHS in the last 72 hours. BNP: No results for input(s): BNP in the last 72 hours. D-Dimer: No results for input(s): DDIMER in the last 72 hours. Hemoglobin A1C: No results for input(s): HGBA1C in the last 72 hours. Fasting Lipid Panel: No results for input(s): CHOL, HDL, LDLCALC, TRIG, CHOLHDL,  LDLDIRECT in the last 72 hours. Thyroid Function Tests: No results for input(s): TSH, T4TOTAL, T3FREE, THYROIDAB in the last 72 hours.  Invalid input(s): FREET3 Anemia Panel: No results for input(s): VITAMINB12, FOLATE, FERRITIN, TIBC, IRON, RETICCTPCT in the last 72 hours.   Radiology: DG Abd 1 View Result Date: 02/10/2024 EXAM: 1 VIEW XRAY OF THE ABDOMEN 02/10/2024 04:44:00 AM COMPARISON: 02/10/2024 CLINICAL HISTORY: Encounter for imaging study to confirm orogastric (OG) tube placement FINDINGS: LINES, TUBES AND DEVICES: Gastric tube in place with side hole at the level of the gastric body. Cardiac pacemaker noted. BOWEL: Nonobstructive bowel gas pattern. SOFT TISSUES: Surgical clips in the right upper quadrant. No opaque urinary calculi. BONES: No acute osseous abnormality. LUNG BASES: Interstitial and patchy airspace opacities in the partially visualized lung bases, probably atelectasis. IMPRESSION: 1. Satisfactory enteric tube placement into the stomach. Electronically signed by: Helayne Hurst MD 02/10/2024 05:25 AM EST RP Workstation: [id number]   DG Abd 1 View Result Date: 02/10/2024 EXAM: 1 VIEW XRAY OF THE ABDOMEN 02/10/2024 04:05:00 AM COMPARISON: None available. CLINICAL HISTORY: Encounter for imaging study to confirm orogastric (OG) tube placement. FINDINGS: LINES, TUBES AND DEVICES: Enteric tube in place with tip and side port overlying the expected region of the gastric lumen. Cardiac pacemaker noted. BOWEL: Nonobstructive bowel gas pattern. SOFT TISSUES: No opaque urinary calculi. BONES: No acute osseous abnormality. LUNG BASES: Patchy airspace opacities in the partially visualized lung bases. IMPRESSION: 1. Enteric tube in appropriate position with tip and side port overlying the expected region of the gastric lumen. 2. Patchy airspace opacities in the partially visualized lung bases. Electronically signed by: Dorethia Molt MD 02/10/2024 04:25 AM EST RP  Workstation: [id number]   CT CHEST WO CONTRAST Result Date: 02/10/2024 EXAM: CT CHEST WITHOUT CONTRAST 02/10/2024 01:28:16 AM TECHNIQUE: CT of the chest was performed without the administration of intravenous contrast. Multiplanar reformatted images are provided for review. Automated exposure control, iterative reconstruction, and/or weight based adjustment of the mA/kV was utilized to reduce the radiation dose to as low as reasonably achievable. COMPARISON: Vertebrae 18 to 2025. CLINICAL HISTORY: CXR post cardiac arrest: predominantly within the right upper lobe, possibly  representing acute pneumonic consolidation, pulmonary hemorrhage, or asymmetric alveolar pulmonary edema as could be seen with acute mitral valve regurgitation. FINDINGS: MEDIASTINUM: Heart: Mild coronary artery calcification. Mild cardiomegaly. A left subclavian dual-lead pacemaker is identified with leads within the left ventricular venous outflow and right ventricle toward the apex. No pericardial effusion. Vessels: The central pulmonary arteries are enlarged in keeping with changes of pulmonary arterial hypertension. Mild atherosclerotic calcification within the thoracic aorta. No aortic aneurysm. Airways: The central airways are clear. Interval endotracheal tube placement attempts 2.6 cm above the carina. Other: Interval nasogastric tube placement extending into the stomach beyond the margin of the exam. Visualized thyroid is unremarkable. LYMPH NODES: Extensive mediastinal adenopathy is again identified and appears stable, nonspecific. This may be reactive, inflammatory as can be seen with conditions such as sarcoidosis, or lymphoproliferative in etiology. Comparison with interval examinations, if available, would be helpful in determining stability. If none are available, follow-up CT or PET CT examination would be helpful in 3 months to demonstrate stability or resolution. LUNGS AND PLEURA: Extensive asymmetric pulmonary ground-glass  infiltrate, multiple consolidation, and smooth interlobular septal thickening, asymmetrically more severe within the right upper lobe. Given its relatively rapid development since prior examination, the findings are most suggestive of asymmetric pulmonary edema, though pulmonary hemorrhage could appear similarly. Extensive pneumonic infiltrate or changes of ARDS are considered less likely given the relatively rapid development. Moderate to large right pleural effusion again noted with compressive atelectasis of the right lower lobe. No pneumothorax. SOFT TISSUES/BONES: Acute fracture of the left seventh rib anterolaterally with minimal displacement. No other acute bone abnormality identified. Osseous structures are otherwise age appropriate. UPPER ABDOMEN: Limited images of the upper abdomen demonstrates no acute abnormality. IMPRESSION: 1. Extensive asymmetric pulmonary ground-glass infiltrate, multifocal consolidation, and smooth interlobular septal thickening, greatest in the right upper lobe, most suggestive of asymmetric pulmonary edema given rapid interval development; acute pulmonary hemorrhage is a differential consideration. Extensive pneumonic infiltrate or ARDS considered less likely. 2. Moderate to large right pleural effusion with compressive atelectasis of the right lower lobe. 3. Acute minimally displaced fracture of the left anterolateral seventh rib. 4. Thoracic adenopathy as outlined above. This is nonspecific, but may be reactive, inflammatory as can be seen with conditions such as sarcoidosis, or lymphoproliferative in etiology. Comparison with interval examinations, if available, be helpful in determining stability. If none are available, follow-up CT or PET CT examination would be helpful in 3 months to demonstrate stability or resolution. 5. Findings consistent with pulmonary arterial hypertension (enlarged central pulmonary arteries). 6. Mild coronary artery calcification and mild  cardiomegaly. Electronically signed by: Dorethia Molt MD 02/10/2024 01:46 AM EST RP Workstation: [id number]   CT HEAD WO CONTRAST ( ) Result Date: 02/10/2024 EXAM: CT HEAD WITHOUT CONTRAST 02/10/2024 01:28:16 AM TECHNIQUE: CT of the head was performed without the administration of intravenous contrast. Automated exposure control, iterative reconstruction, and/or weight based adjustment of the mA/kV was utilized to reduce the radiation dose to as low as reasonably achievable. COMPARISON: None available. CLINICAL HISTORY: Mental status change, unknown cause. FINDINGS: BRAIN AND VENTRICLES: Cerebral ventricle sizes are concordant with the degree of cerebral volume loss.No acute hemorrhage. No evidence of acute infarct. No hydrocephalus. No extra-axial collection. No mass effect or midline shift. Residual contrast material within intracranial vasculature from recent contrast administration. ORBITS: Bilateral lens replacement. SINUSES: No acute abnormality. SOFT TISSUES AND SKULL: Partially imaged endotracheal tube in place. No acute soft tissue abnormality. No skull fracture. IMPRESSION: 1. No acute intracranial abnormality. Electronically  signed by: Morgane Naveau MD 02/10/2024 01:40 AM EST RP Workstation: [id number]   DG Chest Portable 1 View Result Date: 02/10/2024 EXAM: 1 VIEW(S) XRAY OF THE CHEST 02/10/2024 12:12:00 AM COMPARISON: 02/09/2024 CLINICAL HISTORY: post intubation FINDINGS: LINES, TUBES AND DEVICES: Endotracheal tube in place with tip 2.2 cm above the carina. Enteric tube in place, courses below the diaphragm. LUNGS AND PLEURA: Interval development of extensive airspace infiltrate within the right lung, predominantly within the right upper lobe. This may represent acute pneumonic consolidation, pulmonary hemorrhage or asymmetric alveolar pulmonary edema as could be seen with acute mitral valve regurgitation. Known right pleural effusion seen on CT examination of 02/09/2024 is not well  appreciated on this supine radiograph. No definite pneumothorax. HEART AND MEDIASTINUM: Similar mild cardiomegaly. Left chest cardiac pacemaker noted. Aortic arch atherosclerosis. BONES AND SOFT TISSUES: No acute osseous abnormality. IMPRESSION: 1. Interval development of extensive airspace infiltrate within the right lung, predominantly within the right upper lobe, possibly representing acute pneumonic consolidation, pulmonary hemorrhage, or asymmetric alveolar pulmonary edema as could be seen with acute mitral valve regurgitation. 2. Known right pleural effusion seen on CT examination of 02/09/2024 is not well appreciated on this supine radiograph. No definite pneumothorax. Electronically signed by: Dorethia Molt MD 02/10/2024 12:22 AM EST RP Workstation: [id number]   CT Angio Chest PE W and/or Wo Contrast Result Date: 02/09/2024 EXAM: CTA CHEST 02/09/2024 06:29:20 PM TECHNIQUE: CTA of the chest was performed without and with the administration of 75 mL of iohexol  (OMNIPAQUE ) 350 MG/ML injection. Multiplanar reformatted images are provided for review. MIP images are provided for review. Automated exposure control, iterative reconstruction, and/or weight based adjustment of the mA/kV was utilized to reduce the radiation dose to as low as reasonably achievable. COMPARISON: None available. CLINICAL HISTORY: Pulmonary embolism (PE) suspected, low to intermediate prob, positive D-dimer. FINDINGS: PULMONARY ARTERIES: Pulmonary arteries are adequately opacified for evaluation. No acute pulmonary embolus. Main pulmonary artery is normal in caliber. MEDIASTINUM: The heart and pericardium demonstrate no acute abnormality. There is no acute abnormality of the thoracic aorta. LYMPH NODES: Right hilar lymphadenopathy with, as an example, a 1.2 cm lymph node (5:54). Left hilar lymphadenopathy with, as an example, a 1.2 cm lymph node (5 x 15 x 4 mm). Mediastinal lymphadenopathy with a right paratracheal 1.1 cm lymph node  (5.32). No axillary lymphadenopathy. LUNGS AND PLEURA: At least a small volume right pleural effusion. No left pleural effusion. No pneumothorax. Basal atelectasis of the right lower lobe. Diffuse bronchial wall thickening with mucous plugging within the right lower lobe. Mucous plugging within the left lower lobe to a lesser extent. Question of developing small consolidation within the right lower lobe (6:88) with associated bronchial plugging. The trachea and mainstem bronchi are patent. Expiratory phase respiration. UPPER ABDOMEN: Limited images of the upper abdomen are unremarkable. SOFT TISSUES AND BONES: Left chest wall 2 lead cardiac device. No acute bone or soft tissue abnormality. IMPRESSION: 1. No pulmonary embolism. 2. Diffuse bronchial wall thickening with mucous plugging, greater in the right lower lobe, with probable developing small right lower lobe consolidation. 3. Small right pleural effusion. 4. Right hilar, left hilar, and mediastinal lymphadenopathy. Likely reactive in etiology. Recommend attention on follow-up. Electronically signed by: Morgane Naveau MD 02/09/2024 07:12 PM EST RP Workstation: [id number]   DG Chest 2 View Result Date: 02/09/2024 CLINICAL DATA:  Shortness of breath and pleural effusion. EXAM: CHEST - 2 VIEW COMPARISON:  Chest x-ray 01/29/2024.  Chest CT 01/24/2024. FINDINGS: There is a small right  pleural effusion. Left lung is clear. No pneumothorax or focal lung infiltrate. The cardiac silhouette is enlarged. Left-sided pacemaker is again seen. No acute fractures are identified. IMPRESSION: 1. Small right pleural effusion. 2. Cardiomegaly. Electronically Signed   By: Greig Pique M.D.   On: 02/09/2024 15:56   US  THORACENTESIS ASP PLEURAL SPACE W/IMG GUIDE Result Date: 01/29/2024 INDICATION: 73 year old female presents with shortness of breath, cough, and recurrent right pleural effusion. Received request for diagnostic and therapeutic thoracentesis. EXAM: ULTRASOUND  GUIDED RIGHT THORACENTESIS MEDICATIONS: 10 mL 1% lidocaine  COMPLICATIONS: None immediate. PROCEDURE: An ultrasound guided thoracentesis was thoroughly discussed with the patient and questions answered. The benefits, risks, alternatives and complications were also discussed. The patient understands and wishes to proceed with the procedure. Written consent was obtained. Ultrasound was performed to localize and mark an adequate pocket of fluid in the right chest. The area was then prepped and draped in the normal sterile fashion. 1% lidocaine  was used for local anesthesia. Under ultrasound guidance a 6 Fr Safe-T-Centesis catheter was introduced. Thoracentesis was performed. The catheter was removed and a dressing applied. FINDINGS: A total of approximately 700 mL of amber fluid was removed. Samples were sent to the laboratory as requested by the clinical team. IMPRESSION: Successful ultrasound guided right thoracentesis yielding 700 mL of pleural fluid. Performed by: Kristi Davenport, NP Electronically Signed   By: Cordella Banner   On: 01/29/2024 15:39   DG Chest Port 1 View Result Date: 01/29/2024 EXAM: 1 VIEW(S) XRAY OF THE CHEST 01/29/2024 11:40:00 AM COMPARISON: 01/29/2024 CLINICAL HISTORY: Recurrent right pleural effusion FINDINGS: LINES, TUBES AND DEVICES: Left chest pacemaker with leads terminating in the right atrium and coronary sinus. LUNGS AND PLEURA: No focal pulmonary opacity. No pulmonary edema. Near complete resolution of the right pleural effusion with minimal blunting of the right costophrenic angle. Improved aeration of right lung base. No pneumothorax. HEART AND MEDIASTINUM: Cardiomegaly. BONES AND SOFT TISSUES: No acute osseous abnormality. Cholecystectomy clips. IMPRESSION: 1. Near complete resolution of the right pleural effusion with minimal residual blunting of the right costophrenic angle and improved aeration of the right lung base. Electronically signed by: Rogelia Myers MD 01/29/2024  12:53 PM EST RP Workstation: [id number]   DG Chest 2 View Result Date: 01/29/2024 EXAM: 2 VIEW(S) XRAY OF THE CHEST 01/29/2024 06:46:00 AM COMPARISON: 01/26/2024 CLINICAL HISTORY: Pleural effusion on right. FINDINGS: LINES, TUBES AND DEVICES: Stable 2 lead left chest pacemaker. LUNGS AND PLEURA: Increased right base airspace disease. Small right pleural effusion, mildly increased. No pulmonary edema. No pneumothorax. HEART AND MEDIASTINUM: No acute abnormality of the cardiac and mediastinal silhouettes. BONES AND SOFT TISSUES: Upper abdominal surgical clips noted. No acute osseous abnormality. IMPRESSION: 1. Redeveloping small right pleural effusion with increased right base airspace disease, most likely atelectasis. 2. No pneumothorax. Electronically signed by: Rockey Kilts MD 01/29/2024 11:11 AM EST RP Workstation: [id number]   US  THORACENTESIS ASP PLEURAL SPACE W/IMG GUIDE Result Date: 01/26/2024 INDICATION: 73 year old female presents with shortness of breath. Received request for diagnostic and therapeutic thoracentesis. EXAM: ULTRASOUND GUIDED RIGHT THORACENTESIS MEDICATIONS: 8 mL 1% lidocaine  COMPLICATIONS: None immediate. PROCEDURE: An ultrasound guided thoracentesis was thoroughly discussed with the patient and questions answered. The benefits, risks, alternatives and complications were also discussed. The patient understands and wishes to proceed with the procedure. Written consent was obtained. Ultrasound was performed to localize and mark an adequate pocket of fluid in the right chest. The area was then prepped and draped in the normal sterile  fashion. 1% lidocaine  was used for local anesthesia. Under ultrasound guidance a 6 Fr Safe-T-Centesis catheter was introduced. Thoracentesis was performed. The catheter was removed and a dressing applied. FINDINGS: A total of approximately 900 mL of clear yellow fluid was removed. Samples were sent to the laboratory as requested by the clinical team.  IMPRESSION: Successful ultrasound guided right thoracentesis yielding 900 mL of pleural fluid. Performed by: Rayfield Buff, NP Electronically Signed   By: Cordella Banner   On: 01/26/2024 14:49   DG Chest Port 1 View Result Date: 01/26/2024 EXAM: 1 VIEW(S) XRAY OF THE CHEST 01/26/2024 01:02:00 PM COMPARISON: 01/25/2024 CLINICAL HISTORY: SOB (shortness of breath); Status post thoracentesis FINDINGS: LUNGS AND PLEURA: Evacuation of right pleural effusion with a small amount of residual fluid. Minimal overlying atelectasis. The left lung remains clear No post-procedural pneumothorax. No pulmonary edema. No pneumothorax. HEART AND MEDIASTINUM: Cardiomegaly with left subclavian approach cardiac rhythm maintenance device. Aortic arch atherosclerosis. BONES AND SOFT TISSUES: No acute osseous abnormality. IMPRESSION: 1. Evacuation of right pleural effusion with a small residual effusion and minimal overlying atelectasis. No pneumothorax. 2. No post-procedure or pneumothorax. Electronically signed by: Maude Stammer MD 01/26/2024 01:38 PM EST RP Workstation: [id number]   DG Chest Port 1 View Result Date: 01/25/2024 EXAM: 1 VIEW(S) XRAY OF THE CHEST 01/25/2024 09:41:07 AM COMPARISON: 01/24/2024 CLINICAL HISTORY: Shortness of breath FINDINGS: LUNGS AND PLEURA: Small right pleural effusion. Increased right lower and developing inferior right upper lobe airspace disease. Diffuse interstitial thickening. No pneumothorax. HEART AND MEDIASTINUM: Left subclavian dual lead pacemaker unchanged. Cardiomegaly. BONES AND SOFT TISSUES: No acute osseous abnormality. IMPRESSION: 1. Persistent small right pleural effusion with increased right lower and developing inferior right upper airspace disease, favoring pneumonia 2. Cardiomegaly with increased interstitial thickening, favoring mild pulmonary edema Electronically signed by: Rockey Kilts MD 01/25/2024 05:05 PM EST RP Workstation: [id number]   US  Venous Img Lower Bilateral  (DVT) Result Date: 01/24/2024 CLINICAL DATA:  Pulmonary embolism. EXAM: BILATERAL LOWER EXTREMITY VENOUS DOPPLER ULTRASOUND TECHNIQUE: Gray-scale sonography with compression, as well as color and duplex ultrasound, were performed to evaluate the deep venous system(s) from the level of the common femoral vein through the popliteal and proximal calf veins. COMPARISON:  None Available. FINDINGS: VENOUS Normal compressibility of the common femoral, superficial femoral, and popliteal veins, as well as the visualized calf veins. Visualized portions of profunda femoral vein and great saphenous vein unremarkable. No filling defects to suggest DVT on grayscale or color Doppler imaging. Doppler waveforms show normal direction of venous flow, normal respiratory plasticity and response to augmentation. Limited views of the contralateral common femoral vein are unremarkable. OTHER None. Limitations: none IMPRESSION: Negative. Electronically Signed   By: Leita Birmingham M.D.   On: 01/24/2024 14:19   CT Angio Chest PE W and/or Wo Contrast Addendum Date: 01/24/2024 ** ADDENDUM #1 ** ADDENDUM: The above findings were discussed with Dr. Levander at 9:53 am 01/24/24. ---------------------------------------------------- Electronically signed by: Evalene Coho MD 01/24/2024 10:49 AM EST RP Workstation: [id number]   Result Date: 01/24/2024 ** ORIGINAL REPORT ** EXAM: CTA of the Chest with contrast for PE 01/24/2024 09:25:18 AM TECHNIQUE: CTA of the chest was performed without and with the administration of 75 mL of iohexol  (OMNIPAQUE ) 350 MG/ML injection. Multiplanar reformatted images are provided for review. MIP images are provided for review. Automated exposure control, iterative reconstruction, and/or weight based adjustment of the mA/kV was utilized to reduce the radiation dose to as low as reasonably achievable. COMPARISON: PA and lateral radiographs of  the chest dated 01/24/2024. CLINICAL HISTORY: Pulmonary embolism (PE)  suspected, low to intermediate prob, positive D-dimer. FINDINGS: PULMONARY ARTERIES: Pulmonary arteries are adequately opacified for evaluation. There is nonocclusive thromboembolic disease present within the distal right pulmonary artery and within the right lower lobe artery and anterior segmental branch of the right lower lobe pulmonary artery. There is also thromboembolus within the posterior segmental artery. There is no definite thromboembolic disease demonstrated in the left lung. Main pulmonary artery is normal in caliber. MEDIASTINUM: The heart is enlarged. There is no evidence of right ventricular strain. There is mild calcific coronary artery disease. The thoracic aorta demonstrates mild-to-moderate calcific atheromatous disease. The ascending thoracic aorta measures approximately 3.3 cm in diameter. LYMPH NODES: There are numerous enlarged mediastinal lymph nodes. There is also right hilar lymphadenopathy. No axillary lymphadenopathy. LUNGS AND PLEURA: There is mild dependent atelectasis within the right lung. There is a moderate right-sided pleural effusion. No pneumothorax. No focal consolidation or pulmonary edema. UPPER ABDOMEN: Limited images of the upper abdomen are unremarkable. SOFT TISSUES AND BONES: No acute bone or soft tissue abnormality. IMPRESSION: 1. Nonocclusive pulmonary emboli involving the distal right pulmonary artery, right lower lobe artery, and anterior and posterior segmental branches; no left-sided PE identified. No CT evidence of right heart strain. 2. Moderate right pleural effusion. 3. Cardiomegaly with mild coronary artery calcifications and mild-to-moderate thoracic aortic atherosclerosis. 4. Enlarged mediastinal and right hilar lymph nodes; recommend clinical correlation and follow-up per oncologic/infectious/inflammatory considerations. Electronically signed by: Evalene Coho MD 01/24/2024 09:51 AM EST RP Workstation: [id number]   DG Chest 2 View Result Date:  01/24/2024 CLINICAL DATA:  Shortness of breath. EXAM: CHEST - 2 VIEW COMPARISON:  08/08/2020 FINDINGS: Mild cardiomegaly, with dual lead pacemaker in place. Small to moderate right pleural effusion. Right basilar atelectasis versus infiltrate. IMPRESSION: Small to moderate right pleural effusion, with right basilar atelectasis versus infiltrate. Electronically Signed   By: Norleen DELENA Kil M.D.   On: 01/24/2024 08:29    ECHO 02/10/24: 1. TDS.  2. Left ventricular ejection fraction, by estimation, is 30 to 35%. The left ventricle has moderately decreased function. The left ventricle demonstrates global hypokinesis. Left ventricular diastolic function could  not be evaluated.  3. Right ventricular systolic function is moderately reduced. The right  ventricular size is mildly enlarged. Mildly increased right ventricular  wall thickness.  4. Left atrial size was mildly dilated.  5. Right atrial size was mildly dilated.  6. The mitral valve is normal in structure. No evidence of mitral valve  regurgitation.  7. The aortic valve was not well visualized. Aortic valve regurgitation  is not visualized.   TELEMETRY (personally reviewed): VP rate 80-90s  EKG (personally reviewed): VP rate 69 bpm  Data reviewed by me 02/17/2024: last 24h vitals tele labs imaging I/O ED provider note, admission H&P, hospitalist progress note  Principal Problem:   CAP (community acquired pneumonia) Active Problems:   Encounter for dialysis and dialysis catheter care   Acute renal failure   Cardiac arrest (HCC)   Pressure injury of skin    ASSESSMENT AND PLAN:  Deborah Dawson is a 73 y.o. female  with a past medical history of chronic HFrEF, s/p CRT-P 07/2021, persistent atrial fibrillation, hypertension, type 2 diabetes who presented to the ED on 02/09/2024 for shortness of breath. CTA chest concerning for PNA. Overnight had PEA arrest x2 with ROSC. Cardiology was consulted for further evaluation.   # PEA arrest #  Pneumonia # Acute on chronic HFrEF #  S/p CRT-P 07/2021 # Persistent atrial fibrillation Patient initially presented with shortness of breath, concern for pneumonia on CTA chest.  Overnight last night she had PEA arrest with ROSC then shortly afterwards arrested again.  Intubated in the ED.  Troponins mildly elevated and flat trending.  BNP significantly elevated at 15,900. EKG without acute ischemic changes.  Echo this admission with EF 30-35%, global hypokinesis, moderately reduced RV function with mild enlargement and mild RV thicknes. -Nephro managing need for HD. Appreciate their recommendations. -Increase carvedilol  to 6.25 mg twice daily. Will consider reintroducing other GDMT as able. -Continue eliquis  5 mg twice daily. -Mildly elevated and flat troponins most consistent with demand/supply mismatch and not ACS.  -Further management per PCCM.   This patient's plan of care was discussed and created with Dr. Custovic and she is in agreement.  Signed: Danita Bloch, PA-C  02/17/2024, 11:10 AM [facility]

## 2024-02-18 DIAGNOSIS — J189 Pneumonia, unspecified organism: Secondary | ICD-10-CM | POA: Diagnosis not present

## 2024-02-18 DIAGNOSIS — N179 Acute kidney failure, unspecified: Secondary | ICD-10-CM | POA: Diagnosis not present

## 2024-02-18 DIAGNOSIS — I469 Cardiac arrest, cause unspecified: Secondary | ICD-10-CM | POA: Diagnosis not present

## 2024-02-18 DIAGNOSIS — Z4901 Encounter for fitting and adjustment of extracorporeal dialysis catheter: Secondary | ICD-10-CM | POA: Diagnosis not present

## 2024-02-18 LAB — BASIC METABOLIC PANEL WITH GFR
Anion gap: 11 (ref 5–15)
BUN: 30 mg/dL — ABNORMAL HIGH (ref 8–23)
CO2: 18 mmol/L — ABNORMAL LOW (ref 22–32)
Calcium: 6 mg/dL — CL (ref 8.9–10.3)
Chloride: 109 mmol/L (ref 98–111)
Creatinine, Ser: 1.56 mg/dL — ABNORMAL HIGH (ref 0.44–1.00)
GFR, Estimated: 34 mL/min — ABNORMAL LOW (ref >60)
Glucose, Bld: 81 mg/dL (ref 70–99)
Potassium: 2.8 mmol/L — ABNORMAL LOW (ref 3.5–5.1)
Sodium: 138 mmol/L (ref 135–145)

## 2024-02-18 LAB — PHOSPHORUS: Phosphorus: 3 mg/dL (ref 2.5–4.6)

## 2024-02-18 LAB — GLUCOSE, CAPILLARY
Glucose-Capillary: 98 mg/dL (ref 70–99)
Glucose-Capillary: 181 mg/dL — ABNORMAL HIGH (ref 70–99)
Glucose-Capillary: 193 mg/dL — ABNORMAL HIGH (ref 70–99)
Glucose-Capillary: 189 mg/dL — ABNORMAL HIGH (ref 70–99)

## 2024-02-18 LAB — MAGNESIUM: Magnesium: 1.7 mg/dL (ref 1.7–2.4)

## 2024-02-18 LAB — ALBUMIN: Albumin: 2.2 g/dL — ABNORMAL LOW (ref 3.5–5.0)

## 2024-02-18 MED ORDER — POTASSIUM CHLORIDE CRYS ER 20 MEQ PO TBCR
40.0000 meq | EXTENDED_RELEASE_TABLET | ORAL | Status: AC
  Administered 2024-02-18 (×3): 40 meq via ORAL
  Filled 2024-02-18 (×3): qty 2

## 2024-02-18 MED ORDER — GABAPENTIN 100 MG PO CAPS
100.0000 mg | ORAL_CAPSULE | Freq: Two times a day (BID) | ORAL | Status: DC
  Administered 2024-02-18 – 2024-02-24 (×11): 100 mg via ORAL
  Filled 2024-02-18 (×11): qty 1

## 2024-02-18 MED ORDER — CALCIUM GLUCONATE-NACL 1-0.675 GM/50ML-% IV SOLN
1.0000 g | Freq: Once | INTRAVENOUS | Status: AC
  Administered 2024-02-18: 1000 mg via INTRAVENOUS
  Filled 2024-02-18: qty 50

## 2024-02-18 NOTE — Progress Notes (Signed)
 Deborah Dawson with Lab called to report a critical Calcium  of 6. Made MD aware.

## 2024-02-18 NOTE — Progress Notes (Signed)
 PROGRESS NOTE    NATOYA VISCOMI  FMW:[id number] DOB: 03-Apr-1950 DOA: 02/09/2024 PCP: Sadie Manna, MD  Chief Complaint  Patient presents with   Shortness of Breath    Hospital Course:  [name] 73 year old female with extensive past medical history including paroxysmal A-fib, prior pulmonary embolism, chronic heart failure with reduced EF LVEF 25%, recurrent right-sided pleural effusions, permanent pacemaker placement, hypertension, diabetes, who presented to the ED for evaluation of shortness of breath.  She was admitted earlier this month for acute on chronic systolic CHF with right-sided pleural effusion for which she underwent thoracentesis on 11/4, 900 cc removed.  She underwent additional thoracentesis 11/7 additional 700 cc removed.  At that time she was found have a small pulmonary embolism and she was switched from Eliquis  to Xarelto . This admission while patient was admitting room placement in the ED she became suddenly unresponsive and was found to be in PEA.  She underwent 1 round of CPR and ACLS prior to ROSC.  She then became obtunded and was emergently intubated for airway protection.  About 20 minutes after this event she went into PEA again and received another round of CPR and then achieved ROSC.  She was then transferred to the ICU.  She was extubated 11/20 and transferred back to [facility] on 11/21. Stay has been further complicated by severe AKI and metabolic acidosis.  Patient was started on hemodialysis on 11/23.  Patient underwent attempted permacath placement on 11/25.  During permacath placement she received Versed  and fentanyl  and subsequently underwent respiratory arrest.  She required Narcan  before returning to spontaneous respirations and then was transferred to the ICU. Patient became increasingly dyspneic on 11/27 and CXR revealed worsening right-sided pleural effusion.  She underwent thoracentesis 1.2 L fluid removed.  Subjective: Patient reports she is starting to  feel better today.  She is breathing easier and was able to even eat some breakfast.  We discussed nutrition recommendations of NG tube and she is very clear that she is not interested in Dobbhoff.  Objective: Vitals:   02/18/24 1215 02/18/24 1230 02/18/24 1245 02/18/24 1300  BP:    131/89  Pulse: 76 78 79 79  Resp: 12 12 (!) 22 18  Temp:      TempSrc:      SpO2:      Weight:      Height:        Intake/Output Summary (Last 24 hours) at 02/18/2024 1429 Last data filed at 02/18/2024 1124 Gross per 24 hour  Intake 278.07 ml  Output 100 ml  Net 178.07 ml   Filed Weights   02/17/24 0442 02/17/24 0754 02/18/24 0500  Weight: 71.7 kg 71.7 kg 71.2 kg    Examination: General exam: Toxic appearing, holding emesis bag, fatigued, dyspneic, frail Respiratory system: Minimal crackles in lower right lung base, breathing easier today, better aeration bilaterally, on 2 L [place]  cardiovascular system: S1 & S2 heard, RRR.  Gastrointestinal system: Abdomen is nondistended, soft and nontender.  Neuro: Alert and oriented. No focal neurological deficits  Assessment & Plan:  Principal Problem:   CAP (community acquired pneumonia) Active Problems:   Acute renal failure   Encounter for dialysis and dialysis catheter care   Cardiac arrest (HCC)   Pressure injury of skin     PEA arrest x2 -Patient has extensive cardiac history including significantly reduced EF and PPM ICD in place. - Continue monitoring on telemetry ICU - Cardiology consulted as below - Continue monitor on telemetry  Respiratory arrest - Occurred after receiving 2 mg Versed  and 50 mcg of fentanyl  as conscious sedation appropriation for permacath.  Patient was given Narcan  and bagged.  Return to spontaneous respiration. - Continue monitoring respiratory status closely - Currently on nasal cannula and O2 sats are 99%  Acute hypoxic respiratory failure  Pulm edema Moderate to large pleural effusion  -CT on arrival showed  diffuse bronchial wall thickening with mucous plugging, later showed rapid development of pulmonary edema and pleural effusions - 11/27 1.2 L thoracentesis off the right lung.  This is third thoracentesis this month. - Volume removal ideally through dialysis but patient was unable to tolerate her entire session yesterday - Continue with high-dose IV Lasix  for diuresis - Continue to support respirations with saline nebs twice daily and DuoNebs  Parainfluenza virus 3 infection Continue supportive care with nebulizers, Tessalon  - Flu/COVID/RSV negative multiple times this month - Patient continues to complain that she feels generally unwell   Possible PNA -Status post 7 days Unasyn  - CXR 11/27 with worsening pleural effusion and atelectasis.  Could be obscuring pneumonia.  No fever presently.  No WBC elevation.  Acute on chronic Heart failure with reduced EF 20 to 25%  PPM-ICD -Hemodialysis now for volume removal, the patient is not tolerating well - Cont High Dose Lasix  as well -EF this admission 30 to 35%, global hypokinesis   Oliguric AKI, has now progressed to ESRD Metabolic acidosis -May be secondary to ATN from hypotension as well as IV contrast exposure, unclear if she will make significant renal recovery.  Not tolerating dialysis very well - Nephrology consulted - Status post bicarb drip - Started on hemodialysis 11/23 - Attempted place permacath 11/25 but procedure was aborted due to respiratory arrest. she will still need this prior to discharge, likely early next week  Electrolyte derangements Hypocalcemia - Corrects to 7.4, I have ordered replacement  Hypokalemia - Replace as needed - Follow-up CMP  Mild hyperkalemia Status post shifters - Continue close monitoring daily   History of PAF  - Continue carvedilol  --switched from Xarelto  to Eliquis  due to kidney function  Hx of PE --switched from Xarelto  to Eliquis  due to kidney function --cont Eliquis     DM2 Hypoglycemia --hypoglycemia due to poor oral intake -- Sliding scale very sensitive.  Discontinue basal insulin   Severely deconditioned Poor prognosis - Patient is severely deconditioned and given heart failure, PEA arrest, respiratory arrest, and now requiring dialysis.  She has very poor functional reserve and nutrition is recommending Dobbhoff for nutrition.  At this time patient's respiratory status is tenuous and I would not recommend placing Dobbhoff as she is high risk for aspiration until her breathing improves.  Upon our discussion today she is refusing Dobbhoff. - Will consult palliative care for ongoing GOC conversations.  Presently patient desires full code and aggressive measures   Acute 7th rib fracture --Acute minimally displaced fracture of the left anterolateral seventh rib.  --from CPR during codes --pt doesn't want to take opioid pain meds. --tylenol  PRN  Body mass index is 29.66 kg/m. - Outpatient follow up for lifestyle modification and risk factor management   DVT prophylaxis: Eliquis    Code Status: Full Code Disposition: Transfer out of unit, maintain telemetry status.  Follow closely  Consultants:  Treatment Team:  Consulting Physician: Malka Domino, MD  Procedures:  attempted permacath 11/25  Antimicrobials:  Anti-infectives (From admission, onward)    Start     Dose/Rate Route Frequency Ordered Stop   02/16/24 0915  ceFAZolin  (  ANCEF ) IVPB 1 g/50 mL premix  Status:  Discontinued        1 g 100 mL/hr over 30 Minutes Intravenous 30 min pre-op 02/16/24 0916 02/16/24 1232   02/11/24 1200  Ampicillin -Sulbactam (UNASYN ) 3 g in sodium chloride  0.9 % 100 mL IVPB  Status:  Discontinued        3 g 200 mL/hr over 30 Minutes Intravenous Every 12 hours 02/11/24 0749 02/16/24 1922   02/10/24 2230  azithromycin  (ZITHROMAX ) 500 mg in sodium chloride  0.9 % 250 mL IVPB  Status:  Discontinued        500 mg 250 mL/hr over 60 Minutes Intravenous Every 24  hours 02/10/24 0146 02/10/24 1016   02/10/24 0245  Ampicillin -Sulbactam (UNASYN ) 3 g in sodium chloride  0.9 % 100 mL IVPB  Status:  Discontinued        3 g 200 mL/hr over 30 Minutes Intravenous Every 6 hours 02/10/24 0158 02/11/24 0749   02/09/24 2115  cefTRIAXone  (ROCEPHIN ) 1 g in sodium chloride  0.9 % 100 mL IVPB        1 g 200 mL/hr over 30 Minutes Intravenous  Once 02/09/24 2108 02/09/24 2231   02/09/24 2115  azithromycin  (ZITHROMAX ) 500 mg in sodium chloride  0.9 % 250 mL IVPB        500 mg 250 mL/hr over 60 Minutes Intravenous  Once 02/09/24 2108 02/09/24 2336       Data Reviewed: I have personally reviewed following labs and imaging studies CBC: Recent Labs  Lab 02/12/24 0601 02/13/24 0630 02/14/24 1415 02/17/24 1359  WBC 10.0 9.3 7.1 7.7  NEUTROABS  --   --   --  5.6  HGB 13.6 13.2 12.5 12.7  HCT 41.1 40.2 37.4 38.7  MCV 86.5 86.8 85.2 87.4  PLT 155 172 163 178   Basic Metabolic Panel: Recent Labs  Lab 02/12/24 0601 02/12/24 2346 02/13/24 0325 02/13/24 2018 02/14/24 0429 02/16/24 0500 02/17/24 0414 02/18/24 0542  NA 130* 126*   < > 130* 129* 132* 131* 138  K 4.7 5.8*   < > 5.0 5.0 3.9 4.0 2.8*  CL 94* 92*   < > 92* 91* 94* 95* 109  CO2 20* 17*   < > 19* 20* 24 23 18*  GLUCOSE 74 137*   < > 87 66* 124* 128* 81  BUN 68* 79*   < > 80* 88* 40* 44* 30*  CREATININE 2.95* 3.68*   < > 4.15* 4.31* 2.35* 2.48* 1.56*  CALCIUM  8.6* 8.7*   < > 8.5* 8.7* 8.4* 8.5* 6.0*  MG 2.3 2.6*  --   --   --   --  2.6* 1.7  PHOS 4.8*  --   --   --   --   --   --  3.0   < > = values in this interval not displayed.   GFR: Estimated Creatinine Clearance: 28.1 mL/min (A) (by C-G formula based on SCr of 1.56 mg/dL (H)). Liver Function Tests: Recent Labs  Lab 02/18/24 0542  ALBUMIN 2.2*   CBG: Recent Labs  Lab 02/17/24 1133 02/17/24 1643 02/17/24 2204 02/18/24 0729 02/18/24 1113  GLUCAP 138* 131* 121* 98 181*    Recent Results (from the past 240 hours)  Blood culture  (routine x 2)     Status: None   Collection Time: 02/09/24  9:27 PM   Specimen: BLOOD  Result Value Ref Range Status   Specimen Description BLOOD BLOOD RIGHT ARM  Final   Special Requests  Final    BOTTLES DRAWN AEROBIC AND ANAEROBIC Blood Culture results may not be optimal due to an inadequate volume of blood received in culture bottles   Culture   Final    NO GROWTH 5 DAYS Performed at [facility], [address] [place], [place] [address]    Report Status 02/14/2024 FINAL  Final  Blood culture (routine x 2)     Status: None   Collection Time: 02/09/24  9:27 PM   Specimen: BLOOD  Result Value Ref Range Status   Specimen Description BLOOD BLOOD LEFT ARM  Final   Special Requests   Final    BOTTLES DRAWN AEROBIC AND ANAEROBIC Blood Culture results may not be optimal due to an inadequate volume of blood received in culture bottles   Culture   Final    NO GROWTH 5 DAYS Performed at [facility], [address] [place], [place] [address]    Report Status 02/14/2024 FINAL  Final  MRSA Next Gen by PCR, Nasal     Status: None   Collection Time: 02/10/24  1:49 AM   Specimen: Nasal Mucosa; Nasal Swab  Result Value Ref Range Status   MRSA by PCR Next Gen NOT DETECTED NOT DETECTED Final    Comment: (NOTE) The GeneXpert MRSA Assay (FDA approved for NASAL specimens only), is one component of a comprehensive MRSA colonization surveillance program. It is not intended to diagnose MRSA infection nor to guide or monitor treatment for MRSA infections. Test performance is not FDA approved in patients less than 75 years old. Performed at [facility], [address] [place], [place] [address]   Respiratory (~20 pathogens) panel by PCR     Status: Abnormal   Collection Time: 02/10/24  1:49 AM   Specimen: Nasopharyngeal Swab; Respiratory  Result Value Ref Range Status   Adenovirus NOT DETECTED NOT DETECTED Final   Coronavirus 229E NOT DETECTED NOT DETECTED  Final    Comment: (NOTE) The Coronavirus on the Respiratory Panel, DOES NOT test for the novel  Coronavirus (2019 nCoV)    Coronavirus HKU1 NOT DETECTED NOT DETECTED Final   Coronavirus NL63 NOT DETECTED NOT DETECTED Final   Coronavirus OC43 NOT DETECTED NOT DETECTED Final   Metapneumovirus NOT DETECTED NOT DETECTED Final   Rhinovirus / Enterovirus NOT DETECTED NOT DETECTED Final   Influenza A NOT DETECTED NOT DETECTED Final   Influenza B NOT DETECTED NOT DETECTED Final   Parainfluenza Virus 1 NOT DETECTED NOT DETECTED Final   Parainfluenza Virus 2 NOT DETECTED NOT DETECTED Final   Parainfluenza Virus 3 DETECTED (A) NOT DETECTED Final   Parainfluenza Virus 4 NOT DETECTED NOT DETECTED Final   Respiratory Syncytial Virus NOT DETECTED NOT DETECTED Final   Bordetella pertussis NOT DETECTED NOT DETECTED Final   Bordetella Parapertussis NOT DETECTED NOT DETECTED Final   Chlamydophila pneumoniae NOT DETECTED NOT DETECTED Final   Mycoplasma pneumoniae NOT DETECTED NOT DETECTED Final    Comment: Performed at [facility], [address], [place], [place] [address]  Resp panel by RT-PCR (RSV, Flu A&B, Covid) Anterior Nasal Swab     Status: None   Collection Time: 02/10/24  3:17 AM   Specimen: Anterior Nasal Swab  Result Value Ref Range Status   SARS Coronavirus 2 by RT PCR NEGATIVE NEGATIVE Final    Comment: (NOTE) SARS-CoV-2 target nucleic acids are NOT DETECTED.  The SARS-CoV-2 RNA is generally detectable in upper respiratory specimens during the acute phase of infection. The  lowest concentration of SARS-CoV-2 viral copies this assay can detect is 138 copies/mL. A negative result does not preclude SARS-Cov-2 infection and should not be used as the sole basis for treatment or other patient management decisions. A negative result may occur with  improper specimen collection/handling, submission of specimen other than nasopharyngeal swab, presence of viral mutation(s) within  the areas targeted by this assay, and inadequate number of viral copies(<138 copies/mL). A negative result must be combined with clinical observations, patient history, and epidemiological information. The expected result is Negative.  Fact Sheet for Patients:  bloggercourse.com  Fact Sheet for Healthcare Providers:  seriousbroker.it  This test is no t yet approved or cleared by the [place]  FDA and  has been authorized for detection and/or diagnosis of SARS-CoV-2 by FDA under an Emergency Use Authorization (EUA). This EUA will remain  in effect (meaning this test can be used) for the duration of the COVID-19 declaration under Section 564(b)(1) of the Act, 21 U.S.C.section 360bbb-3(b)(1), unless the authorization is terminated  or revoked sooner.       Influenza A by PCR NEGATIVE NEGATIVE Final   Influenza B by PCR NEGATIVE NEGATIVE Final    Comment: (NOTE) The Xpert Xpress SARS-CoV-2/FLU/RSV plus assay is intended as an aid in the diagnosis of influenza from Nasopharyngeal swab specimens and should not be used as a sole basis for treatment. Nasal washings and aspirates are unacceptable for Xpert Xpress SARS-CoV-2/FLU/RSV testing.  Fact Sheet for Patients: bloggercourse.com  Fact Sheet for Healthcare Providers: seriousbroker.it  This test is not yet approved or cleared by the [place]  FDA and has been authorized for detection and/or diagnosis of SARS-CoV-2 by FDA under an Emergency Use Authorization (EUA). This EUA will remain in effect (meaning this test can be used) for the duration of the COVID-19 declaration under Section 564(b)(1) of the Act, 21 U.S.C. section 360bbb-3(b)(1), unless the authorization is terminated or revoked.     Resp Syncytial Virus by PCR NEGATIVE NEGATIVE Final    Comment: (NOTE) Fact Sheet for  Patients: bloggercourse.com  Fact Sheet for Healthcare Providers: seriousbroker.it  This test is not yet approved or cleared by the [place]  FDA and has been authorized for detection and/or diagnosis of SARS-CoV-2 by FDA under an Emergency Use Authorization (EUA). This EUA will remain in effect (meaning this test can be used) for the duration of the COVID-19 declaration under Section 564(b)(1) of the Act, 21 U.S.C. section 360bbb-3(b)(1), unless the authorization is terminated or revoked.  Performed at [facility], [address] [place], [place] [address]   Culture, Respiratory w Gram Stain     Status: None   Collection Time: 02/10/24  7:56 AM   Specimen: Tracheal Aspirate; Respiratory  Result Value Ref Range Status   Specimen Description   Final    TRACHEAL ASPIRATE Performed at [facility], [address] [place], [place] [address]    Special Requests   Final    NONE Performed at [facility], [address] [place], [place] [address]    Gram Stain   Final    FEW WBC PRESENT,BOTH PMN AND MONONUCLEAR NO ORGANISMS SEEN    Culture   Final    NO GROWTH 2 DAYS Performed at [facility], [address] [place], [place] [address]    Report Status 02/12/2024 FINAL  Final  Body fluid culture w Gram Stain     Status: None   Collection Time: 02/11/24 12:25 PM   Specimen:  Pleura; Body Fluid  Result Value Ref Range Status   Specimen Description   Final    PLEURAL Performed at [facility], [address] [place], [place] [address]    Special Requests   Final    NONE Performed at [facility], [address] [place], [place] [address]    Gram Stain   Final    WBC PRESENT, PREDOMINANTLY MONONUCLEAR NO ORGANISMS SEEN    Culture   Final    NO GROWTH 3 DAYS Performed at [facility], [address], [place], [place] [address]    Report Status  02/15/2024 FINAL  Final  Resp panel by RT-PCR (RSV, Flu A&B, Covid) Anterior Nasal Swab     Status: None   Collection Time: 02/17/24  6:19 PM   Specimen: Anterior Nasal Swab  Result Value Ref Range Status   SARS Coronavirus 2 by RT PCR NEGATIVE NEGATIVE Final    Comment: (NOTE) SARS-CoV-2 target nucleic acids are NOT DETECTED.  The SARS-CoV-2 RNA is generally detectable in upper respiratory specimens during the acute phase of infection. The lowest concentration of SARS-CoV-2 viral copies this assay can detect is 138 copies/mL. A negative result does not preclude SARS-Cov-2 infection and should not be used as the sole basis for treatment or other patient management decisions. A negative result may occur with  improper specimen collection/handling, submission of specimen other than nasopharyngeal swab, presence of viral mutation(s) within the areas targeted by this assay, and inadequate number of viral copies(<138 copies/mL). A negative result must be combined with clinical observations, patient history, and epidemiological information. The expected result is Negative.  Fact Sheet for Patients:  bloggercourse.com  Fact Sheet for Healthcare Providers:  seriousbroker.it  This test is no t yet approved or cleared by the [place]  FDA and  has been authorized for detection and/or diagnosis of SARS-CoV-2 by FDA under an Emergency Use Authorization (EUA). This EUA will remain  in effect (meaning this test can be used) for the duration of the COVID-19 declaration under Section 564(b)(1) of the Act, 21 U.S.C.section 360bbb-3(b)(1), unless the authorization is terminated  or revoked sooner.       Influenza A by PCR NEGATIVE NEGATIVE Final   Influenza B by PCR NEGATIVE NEGATIVE Final    Comment: (NOTE) The Xpert Xpress SARS-CoV-2/FLU/RSV plus assay is intended as an aid in the diagnosis of influenza from Nasopharyngeal swab specimens  and should not be used as a sole basis for treatment. Nasal washings and aspirates are unacceptable for Xpert Xpress SARS-CoV-2/FLU/RSV testing.  Fact Sheet for Patients: bloggercourse.com  Fact Sheet for Healthcare Providers: seriousbroker.it  This test is not yet approved or cleared by the [place]  FDA and has been authorized for detection and/or diagnosis of SARS-CoV-2 by FDA under an Emergency Use Authorization (EUA). This EUA will remain in effect (meaning this test can be used) for the duration of the COVID-19 declaration under Section 564(b)(1) of the Act, 21 U.S.C. section 360bbb-3(b)(1), unless the authorization is terminated or revoked.     Resp Syncytial Virus by PCR NEGATIVE NEGATIVE Final    Comment: (NOTE) Fact Sheet for Patients: bloggercourse.com  Fact Sheet for Healthcare Providers: seriousbroker.it  This test is not yet approved or cleared by the [place]  FDA and has been authorized for detection and/or diagnosis of SARS-CoV-2 by FDA under an Emergency Use Authorization (EUA). This EUA will remain in effect (meaning this test can be used) for the duration of the COVID-19 declaration under  Section 564(b)(1) of the Act, 21 U.S.C. section 360bbb-3(b)(1), unless the authorization is terminated or revoked.  Performed at [facility], [address] [place], [place] [address]      Radiology Studies: [facility] Chest Colp 1 View Result Date: 02/17/2024 CLINICAL DATA:  Status post thoracentesis of right pleural effusion. EXAM: PORTABLE CHEST 1 VIEW COMPARISON:  Chest radiograph dated 11/26 7. FINDINGS: Interval decrease in the size of the right pleural effusion post thoracentesis. No pneumothorax. Stable cardiac silhouette. Left pectoral pacemaker device. No acute osseous pathology. IMPRESSION: Interval decrease in the size of the right pleural effusion  post thoracentesis. No pneumothorax. Electronically Signed   By: Vanetta Chou M.D.   On: 02/17/2024 17:24   US  THORACENTESIS ASP PLEURAL SPACE W/IMG GUIDE Result Date: 02/17/2024 INDICATION: 73 year old female with history of fluid overload, recurrent pleural effusion. Request for right thoracentesis. EXAM: ULTRASOUND GUIDED RIGHT THORACENTESIS MEDICATIONS: 10 mL 1% lidocaine  COMPLICATIONS: None immediate. PROCEDURE: An ultrasound guided thoracentesis was thoroughly discussed with the patient and questions answered. The benefits, risks, alternatives and complications were also discussed. The patient understands and wishes to proceed with the procedure. Written consent was obtained. Ultrasound was performed to localize and mark an adequate pocket of fluid in the right chest. The area was then prepped and draped in the normal sterile fashion. 1% Lidocaine  was used for local anesthesia. Under ultrasound guidance a 6 Fr Safe-T-Centesis catheter was introduced. Thoracentesis was performed. The catheter was removed and a dressing applied. FINDINGS: A total of approximately 1.2 liters of clear, yellow fluid was removed. Samples were sent to the laboratory as requested by the clinical team. IMPRESSION: Successful ultrasound guided right thoracentesis yielding 1.2 liters of pleural fluid. Performed by: Kacie Matthews PA-C Electronically Signed   By: Wilkie Lent M.D.   On: 02/17/2024 15:59   DG Chest Port 1 View Result Date: 02/17/2024 EXAM: 1 VIEW(S) XRAY OF THE CHEST 02/17/2024 12:17:00 PM COMPARISON: 02/14/2024 CLINICAL HISTORY: Dyspnea FINDINGS: LINES, TUBES AND DEVICES: Left chest cardiac pacing device noted. LUNGS AND PLEURA: Moderate pleural effusion has increased since the recent radiograph. There are increased right mid lung and basilar opacities likely reflecting atelectasis versus infection. No pneumothorax. HEART AND MEDIASTINUM: Cardiomegaly, unchanged. Atherosclerotic calcifications. BONES AND  SOFT TISSUES: No acute osseous abnormality. IMPRESSION: 1. Moderate right pleural effusion, increased since the recent radiograph. 2. Increased right mid lung and basilar opacities, likely reflecting atelectasis versus infection. Electronically signed by: Donnice Mania MD 02/17/2024 01:10 PM EST RP Workstation: [id number]    Scheduled Meds:  sodium chloride    Intravenous Once   apixaban   5 mg Oral BID   vitamin C   500 mg Oral BID   carvedilol   6.25 mg Oral BID WC   Chlorhexidine  Gluconate Cloth  6 each Topical Daily   feeding supplement  237 mL Oral BID BM   furosemide   60 mg Intravenous Q12H   insulin  aspart  0-6 Units Subcutaneous TID WC   ipratropium-albuterol   3 mL Nebulization BID   lidocaine   1 patch Transdermal Q24H   multivitamin  1 tablet Oral QHS   multivitamin with minerals  1 tablet Oral Daily   potassium chloride   40 mEq Oral Q4H   sodium chloride  flush  3 mL Intravenous Q12H   Continuous Infusions:   LOS: 9 days  CRITICAL CARETotal critical care time: 65 minutes Critical care time was exclusive of separately billable procedures and treating other patients. Critical care was necessary to treat or prevent imminent or life-threatening  deterioration. Critical care was time spent personally by me on the following activities: development of treatment plan with patient and/or surrogate as well as nursing, discussions with consultants, evaluation of patient's response to treatment, examination of patient, obtaining history from patient or surrogate, ordering and performing treatments and interventions, ordering and review of laboratory studies, ordering and review of radiographic studies, pulse oximetry and re-evaluation of patient's condition.   Tiras Bianchini, DO [facility]  To contact the attending physician between 7A-7P please use Epic Chat. To contact the covering physician during after hours 7P-7A, please review Amion.  02/18/2024, 2:29 PM   *This document has  been created with the assistance of dictation software. Please excuse typographical errors. *

## 2024-02-18 NOTE — Progress Notes (Signed)
 [facility] [place], [place] 02/18/24  Subjective:   Hospital day # 9  Patient underwent hemodialysis treatment yesterday. Tolerated well. Urine output remains low at 100 cc over the preceding 24 hours. Case was discussed with vascular surgery and we agreed to hold off on further attempts at PermCath until early next week.   Objective:  Vital signs in last 24 hours:  Temp:  [97.5 F (36.4 C)-97.8 F (36.6 C)] 97.6 F (36.4 C) (11/27 0400) Pulse Rate:  [69-99] 74 (11/27 0500) Resp:  [11-30] 13 (11/27 0500) BP: (114-158)/(68-96) 146/89 (11/27 0500) SpO2:  [95 %-98 %] 98 % (11/27 0750) FiO2 (%):  [40 %] 40 % (11/26 1354) Weight:  [71.2 kg] 71.2 kg (11/27 0500)  Weight change: 0 kg Filed Weights   02/17/24 0442 02/17/24 0754 02/18/24 0500  Weight: 71.7 kg 71.7 kg 71.2 kg    Intake/Output:    Intake/Output Summary (Last 24 hours) at 02/18/2024 1038 Last data filed at 02/18/2024 1003 Gross per 24 hour  Intake 203 ml  Output 100 ml  Net 103 ml     Physical Exam: General: NAD  HEENT Moist oral mucous membranes  Pulm/lungs decreased breath sounds at right base  CVS/Heart paced  Abdomen:  Soft, nontender, nondistended  Extremities: Trace edema  Neurologic: Alert, able to answer questions appropriately  Skin: Warm, dry  Access: Rt femoral HD temp cath       Basic Metabolic Panel:  Recent Labs  Lab 02/12/24 0601 02/12/24 2346 02/13/24 0325 02/13/24 2018 02/14/24 0429 02/16/24 0500 02/17/24 0414 02/18/24 0542  NA 130* 126*   < > 130* 129* 132* 131* 138  K 4.7 5.8*   < > 5.0 5.0 3.9 4.0 2.8*  CL 94* 92*   < > 92* 91* 94* 95* 109  CO2 20* 17*   < > 19* 20* 24 23 18*  GLUCOSE 74 137*   < > 87 66* 124* 128* 81  BUN 68* 79*   < > 80* 88* 40* 44* 30*  CREATININE 2.95* 3.68*   < > 4.15* 4.31* 2.35* 2.48* 1.56*  CALCIUM  8.6* 8.7*   < > 8.5* 8.7* 8.4* 8.5* 6.0*  MG 2.3 2.6*  --   --   --   --  2.6* 1.7  PHOS 4.8*  --   --   --   --   --   --   3.0   < > = values in this interval not displayed.     CBC: Recent Labs  Lab 02/12/24 0601 02/13/24 0630 02/14/24 1415 02/17/24 1359  WBC 10.0 9.3 7.1 7.7  NEUTROABS  --   --   --  5.6  HGB 13.6 13.2 12.5 12.7  HCT 41.1 40.2 37.4 38.7  MCV 86.5 86.8 85.2 87.4  PLT 155 172 163 178      Lab Results  Component Value Date   HEPBSAG NON REACTIVE 02/14/2024      Microbiology:  Recent Results (from the past 240 hours)  Blood culture (routine x 2)     Status: None   Collection Time: 02/09/24  9:27 PM   Specimen: BLOOD  Result Value Ref Range Status   Specimen Description BLOOD BLOOD RIGHT ARM  Final   Special Requests   Final    BOTTLES DRAWN AEROBIC AND ANAEROBIC Blood Culture results may not be optimal due to an inadequate volume of blood received in culture bottles   Culture   Final    NO GROWTH  5 DAYS Performed at [facility], [address] [place], [place] [address]    Report Status 02/14/2024 FINAL  Final  Blood culture (routine x 2)     Status: None   Collection Time: 02/09/24  9:27 PM   Specimen: BLOOD  Result Value Ref Range Status   Specimen Description BLOOD BLOOD LEFT ARM  Final   Special Requests   Final    BOTTLES DRAWN AEROBIC AND ANAEROBIC Blood Culture results may not be optimal due to an inadequate volume of blood received in culture bottles   Culture   Final    NO GROWTH 5 DAYS Performed at [facility], [address] [place], [place] [address]    Report Status 02/14/2024 FINAL  Final  MRSA Next Gen by PCR, Nasal     Status: None   Collection Time: 02/10/24  1:49 AM   Specimen: Nasal Mucosa; Nasal Swab  Result Value Ref Range Status   MRSA by PCR Next Gen NOT DETECTED NOT DETECTED Final    Comment: (NOTE) The GeneXpert MRSA Assay (FDA approved for NASAL specimens only), is one component of a comprehensive MRSA colonization surveillance program. It is not intended to diagnose MRSA infection nor to guide or monitor  treatment for MRSA infections. Test performance is not FDA approved in patients less than 28 years old. Performed at [facility], [address] [place], [place] [address]   Respiratory (~20 pathogens) panel by PCR     Status: Abnormal   Collection Time: 02/10/24  1:49 AM   Specimen: Nasopharyngeal Swab; Respiratory  Result Value Ref Range Status   Adenovirus NOT DETECTED NOT DETECTED Final   Coronavirus 229E NOT DETECTED NOT DETECTED Final    Comment: (NOTE) The Coronavirus on the Respiratory Panel, DOES NOT test for the novel  Coronavirus (2019 nCoV)    Coronavirus HKU1 NOT DETECTED NOT DETECTED Final   Coronavirus NL63 NOT DETECTED NOT DETECTED Final   Coronavirus OC43 NOT DETECTED NOT DETECTED Final   Metapneumovirus NOT DETECTED NOT DETECTED Final   Rhinovirus / Enterovirus NOT DETECTED NOT DETECTED Final   Influenza A NOT DETECTED NOT DETECTED Final   Influenza B NOT DETECTED NOT DETECTED Final   Parainfluenza Virus 1 NOT DETECTED NOT DETECTED Final   Parainfluenza Virus 2 NOT DETECTED NOT DETECTED Final   Parainfluenza Virus 3 DETECTED (A) NOT DETECTED Final   Parainfluenza Virus 4 NOT DETECTED NOT DETECTED Final   Respiratory Syncytial Virus NOT DETECTED NOT DETECTED Final   Bordetella pertussis NOT DETECTED NOT DETECTED Final   Bordetella Parapertussis NOT DETECTED NOT DETECTED Final   Chlamydophila pneumoniae NOT DETECTED NOT DETECTED Final   Mycoplasma pneumoniae NOT DETECTED NOT DETECTED Final    Comment: Performed at [facility], [address], [place], [place] [address]  Resp panel by RT-PCR (RSV, Flu A&B, Covid) Anterior Nasal Swab     Status: None   Collection Time: 02/10/24  3:17 AM   Specimen: Anterior Nasal Swab  Result Value Ref Range Status   SARS Coronavirus 2 by RT PCR NEGATIVE NEGATIVE Final    Comment: (NOTE) SARS-CoV-2 target nucleic acids are NOT DETECTED.  The SARS-CoV-2 RNA is generally detectable in upper respiratory specimens  during the acute phase of infection. The lowest concentration of SARS-CoV-2 viral copies this assay can detect is 138 copies/mL. A negative result does not preclude SARS-Cov-2 infection and should not be used as the sole basis for treatment or other patient management decisions.  A negative result may occur with  improper specimen collection/handling, submission of specimen other than nasopharyngeal swab, presence of viral mutation(s) within the areas targeted by this assay, and inadequate number of viral copies(<138 copies/mL). A negative result must be combined with clinical observations, patient history, and epidemiological information. The expected result is Negative.  Fact Sheet for Patients:  bloggercourse.com  Fact Sheet for Healthcare Providers:  seriousbroker.it  This test is no t yet approved or cleared by the [place]  FDA and  has been authorized for detection and/or diagnosis of SARS-CoV-2 by FDA under an Emergency Use Authorization (EUA). This EUA will remain  in effect (meaning this test can be used) for the duration of the COVID-19 declaration under Section 564(b)(1) of the Act, 21 U.S.C.section 360bbb-3(b)(1), unless the authorization is terminated  or revoked sooner.       Influenza A by PCR NEGATIVE NEGATIVE Final   Influenza B by PCR NEGATIVE NEGATIVE Final    Comment: (NOTE) The Xpert Xpress SARS-CoV-2/FLU/RSV plus assay is intended as an aid in the diagnosis of influenza from Nasopharyngeal swab specimens and should not be used as a sole basis for treatment. Nasal washings and aspirates are unacceptable for Xpert Xpress SARS-CoV-2/FLU/RSV testing.  Fact Sheet for Patients: bloggercourse.com  Fact Sheet for Healthcare Providers: seriousbroker.it  This test is not yet approved or cleared by the [place]  FDA and has been authorized for detection  and/or diagnosis of SARS-CoV-2 by FDA under an Emergency Use Authorization (EUA). This EUA will remain in effect (meaning this test can be used) for the duration of the COVID-19 declaration under Section 564(b)(1) of the Act, 21 U.S.C. section 360bbb-3(b)(1), unless the authorization is terminated or revoked.     Resp Syncytial Virus by PCR NEGATIVE NEGATIVE Final    Comment: (NOTE) Fact Sheet for Patients: bloggercourse.com  Fact Sheet for Healthcare Providers: seriousbroker.it  This test is not yet approved or cleared by the [place]  FDA and has been authorized for detection and/or diagnosis of SARS-CoV-2 by FDA under an Emergency Use Authorization (EUA). This EUA will remain in effect (meaning this test can be used) for the duration of the COVID-19 declaration under Section 564(b)(1) of the Act, 21 U.S.C. section 360bbb-3(b)(1), unless the authorization is terminated or revoked.  Performed at [facility], [address] [place], [place] [address]   Culture, Respiratory w Gram Stain     Status: None   Collection Time: 02/10/24  7:56 AM   Specimen: Tracheal Aspirate; Respiratory  Result Value Ref Range Status   Specimen Description   Final    TRACHEAL ASPIRATE Performed at [facility], [address] [place], [place] [address]    Special Requests   Final    NONE Performed at [facility], [address] [place], [place] [address]    Gram Stain   Final    FEW WBC PRESENT,BOTH PMN AND MONONUCLEAR NO ORGANISMS SEEN    Culture   Final    NO GROWTH 2 DAYS Performed at [facility], [address] [place], [place] [address]    Report Status 02/12/2024 FINAL  Final  Body fluid culture w Gram Stain     Status: None   Collection Time: 02/11/24 12:25 PM   Specimen: Pleura; Body Fluid  Result Value Ref Range Status   Specimen Description   Final    PLEURAL Performed at [facility], [address], [place], [place] [address]    Special Requests  Final    NONE Performed at [facility], [address] [place], [place] [address]    Gram Stain   Final    WBC PRESENT, PREDOMINANTLY MONONUCLEAR NO ORGANISMS SEEN    Culture   Final    NO GROWTH 3 DAYS Performed at [facility], [address] [place], [place] [address]    Report Status 02/15/2024 FINAL  Final  Resp panel by RT-PCR (RSV, Flu A&B, Covid) Anterior Nasal Swab     Status: None   Collection Time: 02/17/24  6:19 PM   Specimen: Anterior Nasal Swab  Result Value Ref Range Status   SARS Coronavirus 2 by RT PCR NEGATIVE NEGATIVE Final    Comment: (NOTE) SARS-CoV-2 target nucleic acids are NOT DETECTED.  The SARS-CoV-2 RNA is generally detectable in upper respiratory specimens during the acute phase of infection. The lowest concentration of SARS-CoV-2 viral copies this assay can detect is 138 copies/mL. A negative result does not preclude SARS-Cov-2 infection and should not be used as the sole basis for treatment or other patient management decisions. A negative result may occur with  improper specimen collection/handling, submission of specimen other than nasopharyngeal swab, presence of viral mutation(s) within the areas targeted by this assay, and inadequate number of viral copies(<138 copies/mL). A negative result must be combined with clinical observations, patient history, and epidemiological information. The expected result is Negative.  Fact Sheet for Patients:  bloggercourse.com  Fact Sheet for Healthcare Providers:  seriousbroker.it  This test is no t yet approved or cleared by the [place]  FDA and  has been authorized for detection and/or diagnosis of SARS-CoV-2 by FDA under an Emergency Use Authorization (EUA). This EUA will remain  in effect (meaning this test can be used) for the duration of  the COVID-19 declaration under Section 564(b)(1) of the Act, 21 U.S.C.section 360bbb-3(b)(1), unless the authorization is terminated  or revoked sooner.       Influenza A by PCR NEGATIVE NEGATIVE Final   Influenza B by PCR NEGATIVE NEGATIVE Final    Comment: (NOTE) The Xpert Xpress SARS-CoV-2/FLU/RSV plus assay is intended as an aid in the diagnosis of influenza from Nasopharyngeal swab specimens and should not be used as a sole basis for treatment. Nasal washings and aspirates are unacceptable for Xpert Xpress SARS-CoV-2/FLU/RSV testing.  Fact Sheet for Patients: bloggercourse.com  Fact Sheet for Healthcare Providers: seriousbroker.it  This test is not yet approved or cleared by the [place]  FDA and has been authorized for detection and/or diagnosis of SARS-CoV-2 by FDA under an Emergency Use Authorization (EUA). This EUA will remain in effect (meaning this test can be used) for the duration of the COVID-19 declaration under Section 564(b)(1) of the Act, 21 U.S.C. section 360bbb-3(b)(1), unless the authorization is terminated or revoked.     Resp Syncytial Virus by PCR NEGATIVE NEGATIVE Final    Comment: (NOTE) Fact Sheet for Patients: bloggercourse.com  Fact Sheet for Healthcare Providers: seriousbroker.it  This test is not yet approved or cleared by the [place]  FDA and has been authorized for detection and/or diagnosis of SARS-CoV-2 by FDA under an Emergency Use Authorization (EUA). This EUA will remain in effect (meaning this test can be used) for the duration of the COVID-19 declaration under Section 564(b)(1) of the Act, 21 U.S.C. section 360bbb-3(b)(1), unless the authorization is terminated or revoked.  Performed at [facility], [address] [place], [place] [address]     Coagulation Studies: No results for input(s): LABPROT,  INR  in the last 72 hours.  Urinalysis: No results for input(s): COLORURINE, LABSPEC, PHURINE, GLUCOSEU, HGBUR, BILIRUBINUR, KETONESUR, PROTEINUR, UROBILINOGEN, NITRITE, LEUKOCYTESUR in the last 72 hours.  Invalid input(s): APPERANCEUR    Imaging: DG Chest Port 1 View Result Date: 02/17/2024 CLINICAL DATA:  Status post thoracentesis of right pleural effusion. EXAM: PORTABLE CHEST 1 VIEW COMPARISON:  Chest radiograph dated 11/26 7. FINDINGS: Interval decrease in the size of the right pleural effusion post thoracentesis. No pneumothorax. Stable cardiac silhouette. Left pectoral pacemaker device. No acute osseous pathology. IMPRESSION: Interval decrease in the size of the right pleural effusion post thoracentesis. No pneumothorax. Electronically Signed   By: Vanetta Chou M.D.   On: 02/17/2024 17:24   US  THORACENTESIS ASP PLEURAL SPACE W/IMG GUIDE Result Date: 02/17/2024 INDICATION: 73 year old female with history of fluid overload, recurrent pleural effusion. Request for right thoracentesis. EXAM: ULTRASOUND GUIDED RIGHT THORACENTESIS MEDICATIONS: 10 mL 1% lidocaine  COMPLICATIONS: None immediate. PROCEDURE: An ultrasound guided thoracentesis was thoroughly discussed with the patient and questions answered. The benefits, risks, alternatives and complications were also discussed. The patient understands and wishes to proceed with the procedure. Written consent was obtained. Ultrasound was performed to localize and mark an adequate pocket of fluid in the right chest. The area was then prepped and draped in the normal sterile fashion. 1% Lidocaine  was used for local anesthesia. Under ultrasound guidance a 6 Fr Safe-T-Centesis catheter was introduced. Thoracentesis was performed. The catheter was removed and a dressing applied. FINDINGS: A total of approximately 1.2 liters of clear, yellow fluid was removed. Samples were sent to the laboratory as requested by the clinical team.  IMPRESSION: Successful ultrasound guided right thoracentesis yielding 1.2 liters of pleural fluid. Performed by: Kacie Matthews PA-C Electronically Signed   By: Wilkie Lent M.D.   On: 02/17/2024 15:59   DG Chest Port 1 View Result Date: 02/17/2024 EXAM: 1 VIEW(S) XRAY OF THE CHEST 02/17/2024 12:17:00 PM COMPARISON: 02/14/2024 CLINICAL HISTORY: Dyspnea FINDINGS: LINES, TUBES AND DEVICES: Left chest cardiac pacing device noted. LUNGS AND PLEURA: Moderate pleural effusion has increased since the recent radiograph. There are increased right mid lung and basilar opacities likely reflecting atelectasis versus infection. No pneumothorax. HEART AND MEDIASTINUM: Cardiomegaly, unchanged. Atherosclerotic calcifications. BONES AND SOFT TISSUES: No acute osseous abnormality. IMPRESSION: 1. Moderate right pleural effusion, increased since the recent radiograph. 2. Increased right mid lung and basilar opacities, likely reflecting atelectasis versus infection. Electronically signed by: Donnice Mania MD 02/17/2024 01:10 PM EST RP Workstation: [id number]   ABORTED INVASIVE LAB PROCEDURE Result Date: 02/16/2024 See surgical note for result.     Medications:      sodium chloride    Intravenous Once   apixaban   5 mg Oral BID   vitamin C   500 mg Oral BID   carvedilol   6.25 mg Oral BID WC   Chlorhexidine  Gluconate Cloth  6 each Topical Daily   feeding supplement  237 mL Oral BID BM   furosemide   60 mg Intravenous Q12H   insulin  aspart  0-6 Units Subcutaneous TID WC   ipratropium-albuterol   3 mL Nebulization BID   lidocaine   1 patch Transdermal Q24H   multivitamin  1 tablet Oral QHS   multivitamin with minerals  1 tablet Oral Daily   potassium chloride   40 mEq Oral Q4H   sodium chloride  flush  3 mL Intravenous Q12H   acetaminophen , alum & mag hydroxide-simeth, benzonatate , hydrALAZINE , HYDROcodone  bit-homatropine, hydrocortisone  cream, ipratropium-albuterol , menthol , mouth rinse, mouth rinse, oxyCODONE ,  polyethylene glycol,  prochlorperazine , promethazine , sodium chloride   Assessment/ Plan:  73 y.o. 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 Pneumonia treated with Unasyn  Renal US  negative for hydronephrosis. Developed respiratory arrest after fentanyl  and Versed  for PermCath placement  Plan: Patient underwent hemodialysis treatment yesterday.  Tolerated well.  We will plan for hemodialysis treatment again tomorrow.  During the last attempted PermCath the patient developed respiratory arrest after being given fentanyl  and Versed .  Case discussed with vascular surgery.  We will plan to reattempt PermCath placement early next week.   Hyperkalemia Resolved and in fact potassium a bit low now.  Repletion ordered for today.   Acute metabolic acidosis Likely secondary to renal insufficiency. Serum bicarbonate a bit low at 18.  Should correct with dialysis treatment again tomorrow.   Hyponatremia Likely secondary to AKI. Serum sodium up to 138.    Chronic kidney disease, stage IIIb.  Baseline creatinine 1.4/GFR 38 from 02/10/2024.  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 Will continue with dialysis and monitor for renal recovery   Comorbidities Chronic systolic CHF-2D echo from 02/10/2024 show LVEF 30 to 35%, global hypokinesis, moderately reduced right ventricular systolic function   Acute pulmonary embolism-patient is currently on  apixaban  5 mg twice a day   Right pleural effusion- Pulmonary and cardiac teams are following. Thoracentesis this admission       LOS: 9 Quinterrius Errington 11/27/202510:38 AM  [address] [place], [place] [phone]

## 2024-02-18 NOTE — Plan of Care (Signed)
  Problem: Education: Goal: Ability to describe self-care measures that may prevent or decrease complications (Diabetes Survival Skills Education) will improve Outcome: Progressing   Problem: Fluid Volume: Goal: Ability to maintain a balanced intake and output will improve Outcome: Progressing   Problem: Health Behavior/Discharge Planning: Goal: Ability to manage health-related needs will improve Outcome: Progressing   Problem: Skin Integrity: Goal: Risk for impaired skin integrity will decrease Outcome: Progressing   Problem: Tissue Perfusion: Goal: Adequacy of tissue perfusion will improve Outcome: Progressing   Problem: Role Relationship: Goal: Method of communication will improve Outcome: Progressing   Problem: Health Behavior/Discharge Planning: Goal: Ability to manage health-related needs will improve Outcome: Progressing   Problem: Clinical Measurements: Goal: Ability to maintain clinical measurements within normal limits will improve Outcome: Progressing   Problem: Elimination: Goal: Will not experience complications related to bowel motility Outcome: Progressing Goal: Will not experience complications related to urinary retention Outcome: Progressing   Problem: Pain Managment: Goal: General experience of comfort will improve and/or be controlled Outcome: Progressing   Problem: Safety: Goal: Ability to remain free from injury will improve Outcome: Progressing

## 2024-02-18 NOTE — Plan of Care (Signed)
  Problem: Activity: Goal: Ability to tolerate increased activity will improve Outcome: Progressing   Problem: Respiratory: Goal: Ability to maintain a clear airway and adequate ventilation will improve Outcome: Progressing   Problem: Clinical Measurements: Goal: Ability to maintain clinical measurements within normal limits will improve Outcome: Progressing

## 2024-02-19 DIAGNOSIS — I469 Cardiac arrest, cause unspecified: Secondary | ICD-10-CM | POA: Diagnosis not present

## 2024-02-19 DIAGNOSIS — J189 Pneumonia, unspecified organism: Secondary | ICD-10-CM | POA: Diagnosis not present

## 2024-02-19 DIAGNOSIS — N179 Acute kidney failure, unspecified: Secondary | ICD-10-CM | POA: Diagnosis not present

## 2024-02-19 DIAGNOSIS — Z4901 Encounter for fitting and adjustment of extracorporeal dialysis catheter: Secondary | ICD-10-CM | POA: Diagnosis not present

## 2024-02-19 LAB — CBC WITH DIFFERENTIAL/PLATELET
Abs Immature Granulocytes: 0.04 K/uL (ref 0.00–0.07)
Basophils Absolute: 0.1 K/uL (ref 0.0–0.1)
Basophils Relative: 1 %
Eosinophils Absolute: 0.4 K/uL (ref 0.0–0.5)
Eosinophils Relative: 6 %
HCT: 40.7 % (ref 36.0–46.0)
Hemoglobin: 13.3 g/dL (ref 12.0–15.0)
Immature Granulocytes: 1 %
Lymphocytes Relative: 26 %
Lymphs Abs: 1.9 K/uL (ref 0.7–4.0)
MCH: 28.9 pg (ref 26.0–34.0)
MCHC: 32.7 g/dL (ref 30.0–36.0)
MCV: 88.3 fL (ref 80.0–100.0)
Monocytes Absolute: 0.9 K/uL (ref 0.1–1.0)
Monocytes Relative: 13 %
Neutro Abs: 3.9 K/uL (ref 1.7–7.7)
Neutrophils Relative %: 53 %
Platelets: 199 K/uL (ref 150–400)
RBC: 4.61 MIL/uL (ref 3.87–5.11)
RDW: 17.9 % — ABNORMAL HIGH (ref 11.5–15.5)
Smear Review: NORMAL
WBC: 7.3 K/uL (ref 4.0–10.5)
nRBC: 3.2 % — ABNORMAL HIGH (ref 0.0–0.2)

## 2024-02-19 LAB — COMPREHENSIVE METABOLIC PANEL WITH GFR
ALT: 97 U/L — ABNORMAL HIGH (ref 0–44)
AST: 76 U/L — ABNORMAL HIGH (ref 15–41)
Albumin: 3.4 g/dL — ABNORMAL LOW (ref 3.5–5.0)
Alkaline Phosphatase: 81 U/L (ref 38–126)
Anion gap: 10 (ref 5–15)
BUN: 43 mg/dL — ABNORMAL HIGH (ref 8–23)
CO2: 25 mmol/L (ref 22–32)
Calcium: 9.3 mg/dL (ref 8.9–10.3)
Chloride: 96 mmol/L — ABNORMAL LOW (ref 98–111)
Creatinine, Ser: 2.37 mg/dL — ABNORMAL HIGH (ref 0.44–1.00)
GFR, Estimated: 21 mL/min — ABNORMAL LOW (ref >60)
Glucose, Bld: 128 mg/dL — ABNORMAL HIGH (ref 70–99)
Potassium: 6.1 mmol/L — ABNORMAL HIGH (ref 3.5–5.1)
Sodium: 131 mmol/L — ABNORMAL LOW (ref 135–145)
Total Bilirubin: 0.8 mg/dL (ref 0.0–1.2)
Total Protein: 6.9 g/dL (ref 6.5–8.1)

## 2024-02-19 LAB — POTASSIUM: Potassium: 4.2 mmol/L (ref 3.5–5.1)

## 2024-02-19 LAB — GLUCOSE, CAPILLARY
Glucose-Capillary: 158 mg/dL — ABNORMAL HIGH (ref 70–99)
Glucose-Capillary: 119 mg/dL — ABNORMAL HIGH (ref 70–99)
Glucose-Capillary: 194 mg/dL — ABNORMAL HIGH (ref 70–99)
Glucose-Capillary: 176 mg/dL — ABNORMAL HIGH (ref 70–99)

## 2024-02-19 LAB — PHOSPHORUS: Phosphorus: 4.4 mg/dL (ref 2.5–4.6)

## 2024-02-19 LAB — CALCIUM, IONIZED: Calcium, Ionized, Serum: 4.6 mg/dL (ref 4.5–5.6)

## 2024-02-19 LAB — MAGNESIUM: Magnesium: 2.4 mg/dL (ref 1.7–2.4)

## 2024-02-19 MED ORDER — SODIUM BICARBONATE 8.4 % IV SOLN
50.0000 meq | Freq: Once | INTRAVENOUS | Status: AC
  Administered 2024-02-19: 50 meq via INTRAVENOUS
  Filled 2024-02-19: qty 50

## 2024-02-19 MED ORDER — INSULIN ASPART 100 UNIT/ML IJ SOLN
10.0000 [IU] | Freq: Once | INTRAMUSCULAR | Status: AC
  Administered 2024-02-19: 10 [IU] via INTRAVENOUS
  Filled 2024-02-19: qty 10

## 2024-02-19 MED ORDER — ALBUTEROL SULFATE (2.5 MG/3ML) 0.083% IN NEBU
2.5000 mg | INHALATION_SOLUTION | Freq: Once | RESPIRATORY_TRACT | Status: DC
  Filled 2024-02-19: qty 3

## 2024-02-19 MED ORDER — HEPARIN SODIUM (PORCINE) 1000 UNIT/ML IJ SOLN
2800.0000 [IU] | Freq: Once | INTRAMUSCULAR | Status: AC
  Administered 2024-02-19: 2800 [IU]

## 2024-02-19 MED ORDER — SODIUM ZIRCONIUM CYCLOSILICATE 5 G PO PACK
10.0000 g | PACK | Freq: Once | ORAL | Status: AC
  Administered 2024-02-19: 10 g via ORAL
  Filled 2024-02-19: qty 2

## 2024-02-19 MED ORDER — DEXTROSE 50 % IV SOLN
1.0000 | Freq: Once | INTRAVENOUS | Status: AC
  Administered 2024-02-19: 50 mL via INTRAVENOUS
  Filled 2024-02-19: qty 50

## 2024-02-19 MED ORDER — CALCIUM GLUCONATE-NACL 1-0.675 GM/50ML-% IV SOLN
1.0000 g | Freq: Once | INTRAVENOUS | Status: AC
  Administered 2024-02-19: 1000 mg via INTRAVENOUS
  Filled 2024-02-19: qty 50

## 2024-02-19 MED ORDER — HEPARIN SODIUM (PORCINE) 1000 UNIT/ML IJ SOLN
INTRAMUSCULAR | Status: AC
  Filled 2024-02-19: qty 3

## 2024-02-19 NOTE — Progress Notes (Signed)
  Received patient in bed to unit.   Informed consent signed and in chart.    TX duration: 3:15     Transported back to floor  Hand-off given to patient's nurse. No c/o no acute distress noted    Access used: R HD Femoral catheter  Access issues: none New dressing    Total UF removed: 1.0L Medication(s) given: none Post HD VS: wnl      Olivia Hurst LPN [facility]

## 2024-02-19 NOTE — Progress Notes (Signed)
 PT Cancellation Note  Patient Details Name: Deborah Dawson MRN: [id number] DOB: Mar 26, 1950   Cancelled Treatment:    Reason Eval/Treat Not Completed: Patient at procedure or test/unavailable (Dialysis. PT will follow up another time)  Deborah Dawson, PT, MPT  Deborah Dawson 02/19/2024, 10:08 AM

## 2024-02-19 NOTE — Progress Notes (Addendum)
 PROGRESS NOTE    Deborah Dawson  FMW:[id number] DOB: [birth year] DOA: 02/09/2024 PCP: Sadie Manna, MD  Chief Complaint  Patient presents with   Shortness of Breath    Hospital Course:  Deborah Dawson 73 year old female with extensive past medical history including paroxysmal A-fib, prior pulmonary embolism, chronic heart failure with reduced EF LVEF 25%, recurrent right-sided pleural effusions, permanent pacemaker placement, hypertension, diabetes, who presented to the ED for evaluation of shortness of breath.  She was admitted earlier this month for acute on chronic systolic CHF with right-sided pleural effusion for which she underwent thoracentesis on 11/4, 900 cc removed.  She underwent additional thoracentesis 11/7 additional 700 cc removed.  At that time she was found have a small pulmonary embolism and she was switched from Eliquis  to Xarelto . This admission while patient was admitting room placement in the ED she became suddenly unresponsive and was found to be in PEA.  She underwent 1 round of CPR and ACLS prior to ROSC.  She then became obtunded and was emergently intubated for airway protection.  About 20 minutes after this event she went into PEA again and received another round of CPR and then achieved ROSC.  She was then transferred to the ICU.  She was extubated 11/20 and transferred back to [facility] on 11/21. Stay has been further complicated by severe AKI and metabolic acidosis.  Patient was started on hemodialysis on 11/23.  Patient underwent attempted permacath placement on 11/25.  During permacath placement she received Versed  and fentanyl  and subsequently underwent respiratory arrest.  She required Narcan  before returning to spontaneous respirations and then was transferred to the ICU. Patient became increasingly dyspneic on 11/27 and CXR revealed worsening right-sided pleural effusion.  She underwent thoracentesis 1.2 L fluid removed.  Subjective: Seen during dialysis today.  She  is tolerating this session much better.  She also reports that she is starting to feel better.  She reports she was even able to urinate today.  Objective: Vitals:   02/19/24 1230 02/19/24 1300 02/19/24 1304 02/19/24 1406  BP: (!) 136/95 (!) 135/90 (!) 149/92 133/89  Pulse: 80 79 76 81  Resp: 12 18 16 18   Temp:   98.6 F (37 C) 97.7 F (36.5 C)  TempSrc:   Oral Oral  SpO2: 100% 99% 100% 99%  Weight:   64.2 kg   Height:        Intake/Output Summary (Last 24 hours) at 02/19/2024 1508 Last data filed at 02/19/2024 1304 Gross per 24 hour  Intake 3 ml  Output 1000 ml  Net -997 ml   Filed Weights   02/19/24 0459 02/19/24 0930 02/19/24 1304  Weight: 67.1 kg 67.1 kg 64.2 kg    Examination: General exam: Toxic appearing, holding emesis bag, fatigued, dyspneic, frail Respiratory system: Minimal crackles in lower right lung base, breathing easier today, better aeration bilaterally, on 2 L [place]  cardiovascular system: S1 & S2 heard, RRR.  Gastrointestinal system: Abdomen is nondistended, soft and nontender.  Neuro: Alert and oriented. No focal neurological deficits  Assessment & Plan:  Principal Problem:   CAP (community acquired pneumonia) Active Problems:   Acute renal failure   Encounter for dialysis and dialysis catheter care   Cardiac arrest (HCC)   Pressure injury of skin     PEA arrest x2 -Patient has extensive cardiac history including significantly reduced EF and PPM ICD in place. - Continue monitoring on telemetry ICU - Cardiology consulted as below - Continue monitoring on telemetry.  Respiratory arrest - Occurred after receiving 2 mg Versed  and 50 mcg of fentanyl  as conscious sedation appropriation for permacath.  Patient was given Narcan  and bagged.  Return to spontaneous respiration. - Continue monitoring respiratory status closely - Currently on nasal cannula and O2 sats are 99%  Acute hypoxic respiratory failure  Pulm edema Moderate to large pleural  effusion  -CT on arrival showed diffuse bronchial wall thickening with mucous plugging, later showed rapid development of pulmonary edema and pleural effusions - 11/27 1.2 L thoracentesis off the right lung.  This is third thoracentesis this month. - Ideally additional volume management through hemodialysis - Continue with high-dose Lasix  for now - Continue to support respirations with saline nebs twice daily and DuoNebs  Parainfluenza virus 3 infection Continue supportive care with nebulizers, Tessalon  - Flu/COVID/RSV negative multiple times this month   Possible PNA -Status post 7 days Unasyn  - CXR 11/27 with worsening pleural effusion and atelectasis.  Could be obscuring pneumonia.  No fever presently.  No WBC elevation.  Acute on chronic Heart failure with reduced EF 20 to 25%  PPM-ICD -Hemodialysis now for volume removal, tolerating dialysis much better today - Cont High Dose Lasix  as well -EF this admission 30 to 35%, global hypokinesis   Oliguric AKI superimposed on CKD stage IIIb, has now progressed to ESRD quiring renal replacement therapy Metabolic acidosis -May be secondary to ATN from hypotension as well as IV contrast exposure, unclear if she will make significant renal recovery - Nephrology consulted - Status post bicarb drip - Started on hemodialysis 11/23 - Attempted place permacath 11/25 but procedure was aborted due to respiratory arrest. she will still need this prior to discharge, likely early next week - Strict I's and O's, patient is receiving Lasix  and reports that she is urinating though not recorded today.  Have asked RN to record.  Electrolyte derangements Hypocalcemia - Status post replacement.  Improved now.  Continue to monitor daily CMP  Hypokalemia - Replace as needed - Follow-up CMP  Mild hyperkalemia -Status post shifters and hemodialysis today - Improved on repeat   History of PAF  - Continue carvedilol  --switched from Xarelto  to Eliquis   due to kidney function  Hx of PE --switched from Xarelto  to Eliquis  due to kidney function --cont Eliquis    DM2 Hypoglycemia --hypoglycemia due to poor oral intake -- Sliding scale very sensitive.  Discontinue basal insulin   Severely deconditioned Poor prognosis - Patient is severely deconditioned and given heart failure, PEA arrest, respiratory arrest, and now requiring dialysis.  She has very poor functional reserve and nutrition is recommending Dobbhoff for nutrition.  Patient does appear to be improving in her strength today and is better tolerating p.o.  Continue to encourage intake.  She is adamantly against NG and given improvement over the last 24 hours I do think she can continue to increase her p.o. intake. - Have consulted palliative care for ongoing GOC conversations.  Presently patient desires full code and aggressive measures   Acute 7th rib fracture --Acute minimally displaced fracture of the left anterolateral seventh rib.  --from CPR during codes --pt doesn't want to take opioid pain meds. --tylenol  PRN  Body mass index is 26.74 kg/m. - Outpatient follow up for lifestyle modification and risk factor management   DVT prophylaxis: Eliquis    Code Status: Full Code Disposition: Continue to monitor on telemetry.  Pending significant clinical improvement  Consultants:  Treatment Team:  Consulting Physician: Malka Domino, MD  Procedures:  attempted permacath 11/25  Antimicrobials:  Anti-infectives (From admission, onward)    Start     Dose/Rate Route Frequency Ordered Stop   02/16/24 0915  ceFAZolin  (ANCEF ) IVPB 1 g/50 mL premix  Status:  Discontinued        1 g 100 mL/hr over 30 Minutes Intravenous 30 min pre-op 02/16/24 0916 02/16/24 1232   02/11/24 1200  Ampicillin -Sulbactam (UNASYN ) 3 g in sodium chloride  0.9 % 100 mL IVPB  Status:  Discontinued        3 g 200 mL/hr over 30 Minutes Intravenous Every 12 hours 02/11/24 0749 02/16/24 1922   02/10/24  2230  azithromycin  (ZITHROMAX ) 500 mg in sodium chloride  0.9 % 250 mL IVPB  Status:  Discontinued        500 mg 250 mL/hr over 60 Minutes Intravenous Every 24 hours 02/10/24 0146 02/10/24 1016   02/10/24 0245  Ampicillin -Sulbactam (UNASYN ) 3 g in sodium chloride  0.9 % 100 mL IVPB  Status:  Discontinued        3 g 200 mL/hr over 30 Minutes Intravenous Every 6 hours 02/10/24 0158 02/11/24 0749   02/09/24 2115  cefTRIAXone  (ROCEPHIN ) 1 g in sodium chloride  0.9 % 100 mL IVPB        1 g 200 mL/hr over 30 Minutes Intravenous  Once 02/09/24 2108 02/09/24 2231   02/09/24 2115  azithromycin  (ZITHROMAX ) 500 mg in sodium chloride  0.9 % 250 mL IVPB        500 mg 250 mL/hr over 60 Minutes Intravenous  Once 02/09/24 2108 02/09/24 2336       Data Reviewed: I have personally reviewed following labs and imaging studies CBC: Recent Labs  Lab 02/13/24 0630 02/14/24 1415 02/17/24 1359 02/19/24 0500  WBC 9.3 7.1 7.7 7.3  NEUTROABS  --   --  5.6 3.9  HGB 13.2 12.5 12.7 13.3  HCT 40.2 37.4 38.7 40.7  MCV 86.8 85.2 87.4 88.3  PLT 172 163 178 199   Basic Metabolic Panel: Recent Labs  Lab 02/12/24 2346 02/13/24 0325 02/14/24 0429 02/16/24 0500 02/17/24 0414 02/18/24 0542 02/19/24 0500 02/19/24 0940  NA 126*   < > 129* 132* 131* 138 131*  --   K 5.8*   < > 5.0 3.9 4.0 2.8* 6.1* 4.2  CL 92*   < > 91* 94* 95* 109 96*  --   CO2 17*   < > 20* 24 23 18* 25  --   GLUCOSE 137*   < > 66* 124* 128* 81 128*  --   BUN 79*   < > 88* 40* 44* 30* 43*  --   CREATININE 3.68*   < > 4.31* 2.35* 2.48* 1.56* 2.37*  --   CALCIUM  8.7*   < > 8.7* 8.4* 8.5* 6.0* 9.3  --   MG 2.6*  --   --   --  2.6* 1.7 2.4  --   PHOS  --   --   --   --   --  3.0 4.4  --    < > = values in this interval not displayed.   GFR: Estimated Creatinine Clearance: 17.6 mL/min (A) (by C-G formula based on SCr of 2.37 mg/dL (H)). Liver Function Tests: Recent Labs  Lab 02/18/24 0542 02/19/24 0500  AST  --  76*  ALT  --  97*   ALKPHOS  --  81  BILITOT  --  0.8  PROT  --  6.9  ALBUMIN 2.2* 3.4*   CBG: Recent Labs  Lab 02/18/24 1113 02/18/24  1613 02/18/24 2135 02/19/24 0743 02/19/24 1411  GLUCAP 181* 193* 189* 158* 119*    Recent Results (from the past 240 hours)  Blood culture (routine x 2)     Status: None   Collection Time: 02/09/24  9:27 PM   Specimen: BLOOD  Result Value Ref Range Status   Specimen Description BLOOD BLOOD RIGHT ARM  Final   Special Requests   Final    BOTTLES DRAWN AEROBIC AND ANAEROBIC Blood Culture results may not be optimal due to an inadequate volume of blood received in culture bottles   Culture   Final    NO GROWTH 5 DAYS Performed at [facility], [address] [place], [place] [address]    Report Status 02/14/2024 FINAL  Final  Blood culture (routine x 2)     Status: None   Collection Time: 02/09/24  9:27 PM   Specimen: BLOOD  Result Value Ref Range Status   Specimen Description BLOOD BLOOD LEFT ARM  Final   Special Requests   Final    BOTTLES DRAWN AEROBIC AND ANAEROBIC Blood Culture results may not be optimal due to an inadequate volume of blood received in culture bottles   Culture   Final    NO GROWTH 5 DAYS Performed at [facility], [address], [place], [place] [address]    Report Status 02/14/2024 FINAL  Final  MRSA Next Gen by PCR, Nasal     Status: None   Collection Time: 02/10/24  1:49 AM   Specimen: Nasal Mucosa; Nasal Swab  Result Value Ref Range Status   MRSA by PCR Next Gen NOT DETECTED NOT DETECTED Final    Comment: (NOTE) The GeneXpert MRSA Assay (FDA approved for NASAL specimens only), is one component of a comprehensive MRSA colonization surveillance program. It is not intended to diagnose MRSA infection nor to guide or monitor treatment for MRSA infections. Test performance is not FDA approved in patients less than 32 years old. Performed at [facility], [address] [place], [place] [address]    Respiratory (~20 pathogens) panel by PCR     Status: Abnormal   Collection Time: 02/10/24  1:49 AM   Specimen: Nasopharyngeal Swab; Respiratory  Result Value Ref Range Status   Adenovirus NOT DETECTED NOT DETECTED Final   Coronavirus 229E NOT DETECTED NOT DETECTED Final    Comment: (NOTE) The Coronavirus on the Respiratory Panel, DOES NOT test for the novel  Coronavirus (2019 nCoV)    Coronavirus HKU1 NOT DETECTED NOT DETECTED Final   Coronavirus NL63 NOT DETECTED NOT DETECTED Final   Coronavirus OC43 NOT DETECTED NOT DETECTED Final   Metapneumovirus NOT DETECTED NOT DETECTED Final   Rhinovirus / Enterovirus NOT DETECTED NOT DETECTED Final   Influenza A NOT DETECTED NOT DETECTED Final   Influenza B NOT DETECTED NOT DETECTED Final   Parainfluenza Virus 1 NOT DETECTED NOT DETECTED Final   Parainfluenza Virus 2 NOT DETECTED NOT DETECTED Final   Parainfluenza Virus 3 DETECTED (A) NOT DETECTED Final   Parainfluenza Virus 4 NOT DETECTED NOT DETECTED Final   Respiratory Syncytial Virus NOT DETECTED NOT DETECTED Final   Bordetella pertussis NOT DETECTED NOT DETECTED Final   Bordetella Parapertussis NOT DETECTED NOT DETECTED Final   Chlamydophila pneumoniae NOT DETECTED NOT DETECTED Final   Mycoplasma pneumoniae NOT DETECTED NOT DETECTED Final    Comment: Performed at [facility], [address] [place], [place] [address]  Resp panel by RT-PCR (RSV, Flu A&B, Covid)  Anterior Nasal Swab     Status: None   Collection Time: 02/10/24  3:17 AM   Specimen: Anterior Nasal Swab  Result Value Ref Range Status   SARS Coronavirus 2 by RT PCR NEGATIVE NEGATIVE Final    Comment: (NOTE) SARS-CoV-2 target nucleic acids are NOT DETECTED.  The SARS-CoV-2 RNA is generally detectable in upper respiratory specimens during the acute phase of infection. The lowest concentration of SARS-CoV-2 viral copies this assay can detect is 138 copies/mL. A negative result does not preclude SARS-Cov-2 infection  and should not be used as the sole basis for treatment or other patient management decisions. A negative result may occur with  improper specimen collection/handling, submission of specimen other than nasopharyngeal swab, presence of viral mutation(s) within the areas targeted by this assay, and inadequate number of viral copies(<138 copies/mL). A negative result must be combined with clinical observations, patient history, and epidemiological information. The expected result is Negative.  Fact Sheet for Patients:  bloggercourse.com  Fact Sheet for Healthcare Providers:  seriousbroker.it  This test is no t yet approved or cleared by the [place]  FDA and  has been authorized for detection and/or diagnosis of SARS-CoV-2 by FDA under an Emergency Use Authorization (EUA). This EUA will remain  in effect (meaning this test can be used) for the duration of the COVID-19 declaration under Section 564(b)(1) of the Act, 21 U.S.C.section 360bbb-3(b)(1), unless the authorization is terminated  or revoked sooner.       Influenza A by PCR NEGATIVE NEGATIVE Final   Influenza B by PCR NEGATIVE NEGATIVE Final    Comment: (NOTE) The Xpert Xpress SARS-CoV-2/FLU/RSV plus assay is intended as an aid in the diagnosis of influenza from Nasopharyngeal swab specimens and should not be used as a sole basis for treatment. Nasal washings and aspirates are unacceptable for Xpert Xpress SARS-CoV-2/FLU/RSV testing.  Fact Sheet for Patients: bloggercourse.com  Fact Sheet for Healthcare Providers: seriousbroker.it  This test is not yet approved or cleared by the [place]  FDA and has been authorized for detection and/or diagnosis of SARS-CoV-2 by FDA under an Emergency Use Authorization (EUA). This EUA will remain in effect (meaning this test can be used) for the duration of the COVID-19 declaration  under Section 564(b)(1) of the Act, 21 U.S.C. section 360bbb-3(b)(1), unless the authorization is terminated or revoked.     Resp Syncytial Virus by PCR NEGATIVE NEGATIVE Final    Comment: (NOTE) Fact Sheet for Patients: bloggercourse.com  Fact Sheet for Healthcare Providers: seriousbroker.it  This test is not yet approved or cleared by the [place]  FDA and has been authorized for detection and/or diagnosis of SARS-CoV-2 by FDA under an Emergency Use Authorization (EUA). This EUA will remain in effect (meaning this test can be used) for the duration of the COVID-19 declaration under Section 564(b)(1) of the Act, 21 U.S.C. section 360bbb-3(b)(1), unless the authorization is terminated or revoked.  Performed at [facility], [address] [place], [place] [address]   Culture, Respiratory w Gram Stain     Status: None   Collection Time: 02/10/24  7:56 AM   Specimen: Tracheal Aspirate; Respiratory  Result Value Ref Range Status   Specimen Description   Final    TRACHEAL ASPIRATE Performed at [facility], [address], [place], [place] [address]    Special Requests   Final    NONE Performed at [facility], [address] [place], [place] [address]    Gram Stain   Final  FEW WBC PRESENT,BOTH PMN AND MONONUCLEAR NO ORGANISMS SEEN    Culture   Final    NO GROWTH 2 DAYS Performed at [facility], [address] [place], [place] [address]    Report Status 02/12/2024 FINAL  Final  Body fluid culture w Gram Stain     Status: None   Collection Time: 02/11/24 12:25 PM   Specimen: Pleura; Body Fluid  Result Value Ref Range Status   Specimen Description   Final    PLEURAL Performed at [facility], [address] [place], [place] [address]    Special Requests   Final    NONE Performed at [facility], [address] [place], [place] [address]    Gram Stain    Final    WBC PRESENT, PREDOMINANTLY MONONUCLEAR NO ORGANISMS SEEN    Culture   Final    NO GROWTH 3 DAYS Performed at [facility], [address] [place], [place] [address]    Report Status 02/15/2024 FINAL  Final  Resp panel by RT-PCR (RSV, Flu A&B, Covid) Anterior Nasal Swab     Status: None   Collection Time: 02/17/24  6:19 PM   Specimen: Anterior Nasal Swab  Result Value Ref Range Status   SARS Coronavirus 2 by RT PCR NEGATIVE NEGATIVE Final    Comment: (NOTE) SARS-CoV-2 target nucleic acids are NOT DETECTED.  The SARS-CoV-2 RNA is generally detectable in upper respiratory specimens during the acute phase of infection. The lowest concentration of SARS-CoV-2 viral copies this assay can detect is 138 copies/mL. A negative result does not preclude SARS-Cov-2 infection and should not be used as the sole basis for treatment or other patient management decisions. A negative result may occur with  improper specimen collection/handling, submission of specimen other than nasopharyngeal swab, presence of viral mutation(s) within the areas targeted by this assay, and inadequate number of viral copies(<138 copies/mL). A negative result must be combined with clinical observations, patient history, and epidemiological information. The expected result is Negative.  Fact Sheet for Patients:  bloggercourse.com  Fact Sheet for Healthcare Providers:  seriousbroker.it  This test is no t yet approved or cleared by the [place]  FDA and  has been authorized for detection and/or diagnosis of SARS-CoV-2 by FDA under an Emergency Use Authorization (EUA). This EUA will remain  in effect (meaning this test can be used) for the duration of the COVID-19 declaration under Section 564(b)(1) of the Act, 21 U.S.C.section 360bbb-3(b)(1), unless the authorization is terminated  or revoked sooner.       Influenza A by PCR NEGATIVE NEGATIVE  Final   Influenza B by PCR NEGATIVE NEGATIVE Final    Comment: (NOTE) The Xpert Xpress SARS-CoV-2/FLU/RSV plus assay is intended as an aid in the diagnosis of influenza from Nasopharyngeal swab specimens and should not be used as a sole basis for treatment. Nasal washings and aspirates are unacceptable for Xpert Xpress SARS-CoV-2/FLU/RSV testing.  Fact Sheet for Patients: bloggercourse.com  Fact Sheet for Healthcare Providers: seriousbroker.it  This test is not yet approved or cleared by the [place]  FDA and has been authorized for detection and/or diagnosis of SARS-CoV-2 by FDA under an Emergency Use Authorization (EUA). This EUA will remain in effect (meaning this test can be used) for the duration of the COVID-19 declaration under Section 564(b)(1) of the Act, 21 U.S.C. section 360bbb-3(b)(1), unless the authorization is terminated or revoked.     Resp Syncytial Virus by PCR NEGATIVE NEGATIVE Final  Comment: (NOTE) Fact Sheet for Patients: bloggercourse.com  Fact Sheet for Healthcare Providers: seriousbroker.it  This test is not yet approved or cleared by the [place]  FDA and has been authorized for detection and/or diagnosis of SARS-CoV-2 by FDA under an Emergency Use Authorization (EUA). This EUA will remain in effect (meaning this test can be used) for the duration of the COVID-19 declaration under Section 564(b)(1) of the Act, 21 U.S.C. section 360bbb-3(b)(1), unless the authorization is terminated or revoked.  Performed at [facility], [address] [place], [place] [address]      Radiology Studies: [facility] Chest Enlow 1 View Result Date: 02/17/2024 CLINICAL DATA:  Status post thoracentesis of right pleural effusion. EXAM: PORTABLE CHEST 1 VIEW COMPARISON:  Chest radiograph dated 11/26 7. FINDINGS: Interval decrease in the size of the right  pleural effusion post thoracentesis. No pneumothorax. Stable cardiac silhouette. Left pectoral pacemaker device. No acute osseous pathology. IMPRESSION: Interval decrease in the size of the right pleural effusion post thoracentesis. No pneumothorax. Electronically Signed   By: Vanetta Chou M.D.   On: 02/17/2024 17:24   US  THORACENTESIS ASP PLEURAL SPACE W/IMG GUIDE Result Date: 02/17/2024 INDICATION: 73 year old female with history of fluid overload, recurrent pleural effusion. Request for right thoracentesis. EXAM: ULTRASOUND GUIDED RIGHT THORACENTESIS MEDICATIONS: 10 mL 1% lidocaine  COMPLICATIONS: None immediate. PROCEDURE: An ultrasound guided thoracentesis was thoroughly discussed with the patient and questions answered. The benefits, risks, alternatives and complications were also discussed. The patient understands and wishes to proceed with the procedure. Written consent was obtained. Ultrasound was performed to localize and mark an adequate pocket of fluid in the right chest. The area was then prepped and draped in the normal sterile fashion. 1% Lidocaine  was used for local anesthesia. Under ultrasound guidance a 6 Fr Safe-T-Centesis catheter was introduced. Thoracentesis was performed. The catheter was removed and a dressing applied. FINDINGS: A total of approximately 1.2 liters of clear, yellow fluid was removed. Samples were sent to the laboratory as requested by the clinical team. IMPRESSION: Successful ultrasound guided right thoracentesis yielding 1.2 liters of pleural fluid. Performed by: Kacie Matthews PA-C Electronically Signed   By: Wilkie Lent M.D.   On: 02/17/2024 15:59    Scheduled Meds:  sodium chloride    Intravenous Once   albuterol   2.5 mg Nebulization Once   apixaban   5 mg Oral BID   vitamin C   500 mg Oral BID   carvedilol   6.25 mg Oral BID WC   Chlorhexidine  Gluconate Cloth  6 each Topical Daily   feeding supplement  237 mL Oral BID BM   furosemide   60 mg Intravenous  Q12H   gabapentin   100 mg Oral BID   insulin  aspart  0-6 Units Subcutaneous TID WC   ipratropium-albuterol   3 mL Nebulization BID   lidocaine   1 patch Transdermal Q24H   multivitamin  1 tablet Oral QHS   multivitamin with minerals  1 tablet Oral Daily   sodium chloride  flush  3 mL Intravenous Q12H   Continuous Infusions:   LOS: 10 days  CRITICAL CARETotal critical care time: 65 minutes Critical care time was exclusive of separately billable procedures and treating other patients. Critical care was necessary to treat or prevent imminent or life-threatening deterioration. Critical care was time spent personally by me on the following activities: development of treatment plan with patient and/or surrogate as well as nursing, discussions with consultants, evaluation of patient's response to treatment, examination of patient, obtaining history from patient or surrogate,  ordering and performing treatments and interventions, ordering and review of laboratory studies, ordering and review of radiographic studies, pulse oximetry and re-evaluation of patient's condition.   Dequandre Cordova, DO [facility]  To contact the attending physician between 7A-7P please use Epic Chat. To contact the covering physician during after hours 7P-7A, please review Amion.  02/19/2024, 3:08 PM   *This document has been created with the assistance of dictation software. Please excuse typographical errors. *

## 2024-02-19 NOTE — Consult Note (Signed)
 Consultation Note Date: 02/19/2024 at 1000  Patient Name: Deborah Dawson  DOB: December 04, 1950  MRN: [id number]  Age / Sex: 73 y.o., female  PCP: Sadie Manna, MD Referring Physician: Leesa Kast, DO  HPI/Patient Profile: 73 y.o. female  with past medical history significant for paroxysmal A-fib, prior PE, chronic heart failure with reduced EF LVEF 25%, current right sided pleural effusions, permanent pacemaker placement, HTN, diabetes.  Patient presented to ED 02/09/2024 c/o shortness of breath.  Patient reported in the ED she had had intermittent episodes of shortness of breath with worsening the night before.  She denied chest pain.  She also complained of ongoing cough for several months.  Patient reports history of fluid buildup in her lungs and had her Lasix  dose increased.  Patient reported at that time she is currently taking 10 mg of Lasix  daily.  ED labs showed BNP 2499, D-dimer 2.82, BUN 29, creatinine 1.24, calcium  8.8, albumin 3.4, T. bili 1.4, GFR 45 and high sensitive troponin 31. CXR demonstrated right sided pleural effusion with questionable superimposed infection. CTA chest showed right-sided PE and pleural effusion. ED vitals 182/103, HR 64, RR 19, SpO2 97% and 98.1 F.  TRH was consulted for admission and management of acute respiratory failure with hypoxia, acute pulmonary emboli acute on chronic HFrEF decompensation.   Palliative was consulted for assistance with goals of care conversations.   Clinical Assessment and Goals of Care: Extensive chart review completed prior to meeting patient including labs, vital signs, imaging, progress notes, orders, and available advanced directive documents from current and previous encounters. I then met with patient to discuss diagnosis prognosis, GOC, EOL wishes, disposition and options.     Latest Ref Rng & Units 02/19/2024    5:00 AM 02/17/2024     1:59 PM 02/14/2024    2:15 PM  CBC  WBC 4.0 - 10.5 K/uL 7.3  7.7  7.1   Hemoglobin 12.0 - 15.0 g/dL 86.6  87.2  87.4   Hematocrit 36.0 - 46.0 % 40.7  38.7  37.4   Platelets 150 - 400 K/uL 199  178  163       Latest Ref Rng & Units 02/19/2024    9:40 AM 02/19/2024    5:00 AM 02/18/2024    5:42 AM  CMP  Glucose 70 - 99 mg/dL  871  81   BUN 8 - 23 mg/dL  43  30   Creatinine 9.55 - 1.00 mg/dL  7.62  8.43   Sodium 864 - 145 mmol/L  131  138   Potassium 3.5 - 5.1 mmol/L 4.2  6.1  2.8   Chloride 98 - 111 mmol/L  96  109   CO2 22 - 32 mmol/L  25  18   Calcium  8.9 - 10.3 mg/dL  9.3  6.0   Total Protein 6.5 - 8.1 g/dL  6.9    Total Bilirubin 0.0 - 1.2 mg/dL  0.8    Alkaline Phos 38 - 126 U/L  81    AST 15 -  41 U/L  76    ALT 0 - 44 U/L  97     Elderly female lying in bed.  She is alert and oriented x 4 and able to participate in goals of care conversation. She is in no distress. She denies pain, chest pain or shortness of breath.    I introduced Palliative Medicine as specialized medical care for people living with serious illness. It focuses on providing relief from the symptoms and stress of a serious illness. The goal is to improve quality of life for both the patient and the family.  We discussed a brief life review of the patient.  Patient shares she lives at home with her husband.  She has 2 sons and 6 grandchildren.  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  As far as functional and nutritional status she shares her appetite was poor prior to admission due to shortness of breath.  She shares she is trying to eat more while she is here.  Patient performs all of her ADLs independently including driving.  She does not utilize DME.  We discussed patient's current illness and what it means in the larger context of patient's on-going co-morbidities.  Natural disease trajectory and expectations at EOL were discussed.  I attempted to elicit values and goals of care  important to the patient.  The most important thing to patient right now is to go home.  We discussed dialysis in the outpatient setting.  Patient shares that she knew she would eventually need dialysis for the rest of her life.  She shares that once she is able to get her femoral catheter removed and PermCath placed for dialysis she can go home.  The difference between aggressive medical intervention and comfort care was considered in light of the patient's goals of care.   Advance directives, concepts specific to code status, artificial feeding and hydration, and rehospitalization were considered and discussed.  Patient shares that she wants to continue to be a full code as she has received CPR x 2 during this admission with intubation.  However, she shares she would not want to live in a vegetative state.  She remains a full code.  Education offered regarding concept specific to human mortality and the limitations of medical interventions to prolong life when the body begins to fail to thrive.  Family is facing treatment option decisions, advanced directive, and anticipatory care needs.  [name] shares if she is unable to make medical decisions for herself her husband [name] would be her surrogate decision maker.   Discussed with patient/family the importance of continued conversation with family and the medical providers regarding overall plan of care and treatment options, ensuring decisions are within the context of the patient's values and GOCs.    Questions and concerns were addressed. The family was encouraged to call with questions or concerns.   Primary Decision Maker PATIENT  Physical Exam Vitals reviewed.  Constitutional:      General: She is not in acute distress.    Appearance: She is not ill-appearing.  HENT:     Head: Normocephalic and atraumatic.     Nose:     Comments: O2 via [place]    Mouth/Throat:     Mouth: Mucous membranes are moist.  Pulmonary:     Effort: Pulmonary  effort is normal. No respiratory distress.  Skin:    General: Skin is warm and dry.  Neurological:     Mental Status: She is alert and oriented  to person, place, and time.  Psychiatric:        Mood and Affect: Mood normal.        Behavior: Behavior normal.        Thought Content: Thought content normal.        Judgment: Judgment normal.   Recommendations/Plan: FULL CODE status as previously documented    Continue current supportive interventions Plan to d/c home when medically stable and continue outpatient dialysis  Palliative Assessment/Data: 60 to 70%     Thank you for this consult. Palliative medicine will continue to follow and assist holistically.   Time Total: 75 minutes  Time spent includes: Detailed review of medical records (labs, imaging, vital signs), medically appropriate exam (mental status, respiratory, cardiac, skin), discussed with treatment team, counseling and educating patient, family and staff, documenting clinical information, medication management and coordination of care.     Devere Sacks, ELNITA- [facility] Palliative Medicine Team  02/19/2024 8:32 AM  Office [phone]  Pager [phone]     Please contact Palliative Medicine Team providers via AMION for questions and concerns.

## 2024-02-19 NOTE — Progress Notes (Signed)
 OT Cancellation Note  Patient Details Name: Deborah Dawson MRN: [id number] DOB: May 22, 1950   Cancelled Treatment:    Reason Eval/Treat Not Completed: Patient at procedure or test/ unavailable. Attempted x2 this AM, pt with providers at bedside, now off unit at HD. Will hold and reattempt as available.   Elston Slot, M.S. OTR/L  02/19/24, 10:25 AM  ascom [phone]

## 2024-02-19 NOTE — Plan of Care (Signed)
 Problem: Education: Goal: Ability to describe self-care measures that may prevent or decrease complications (Diabetes Survival Skills Education) will improve Outcome: Progressing Goal: Individualized Educational Video(s) Outcome: Progressing   Problem: Coping: Goal: Ability to adjust to condition or change in health will improve Outcome: Progressing   Problem: Fluid Volume: Goal: Ability to maintain a balanced intake and output will improve Outcome: Progressing   Problem: Health Behavior/Discharge Planning: Goal: Ability to identify and utilize available resources and services will improve Outcome: Progressing Goal: Ability to manage health-related needs will improve Outcome: Progressing   Problem: Metabolic: Goal: Ability to maintain appropriate glucose levels will improve Outcome: Progressing   Problem: Nutritional: Goal: Maintenance of adequate nutrition will improve Outcome: Progressing Goal: Progress toward achieving an optimal weight will improve Outcome: Progressing   Problem: Skin Integrity: Goal: Risk for impaired skin integrity will decrease Outcome: Progressing   Problem: Tissue Perfusion: Goal: Adequacy of tissue perfusion will improve Outcome: Progressing   Problem: Activity: Goal: Ability to tolerate increased activity will improve Outcome: Progressing   Problem: Respiratory: Goal: Ability to maintain a clear airway and adequate ventilation will improve Outcome: Progressing   Problem: Role Relationship: Goal: Method of communication will improve Outcome: Progressing   Problem: Education: Goal: Knowledge of General Education information will improve Description: Including pain rating scale, medication(s)/side effects and non-pharmacologic comfort measures Outcome: Progressing   Problem: Health Behavior/Discharge Planning: Goal: Ability to manage health-related needs will improve Outcome: Progressing   Problem: Clinical Measurements: Goal:  Ability to maintain clinical measurements within normal limits will improve Outcome: Progressing Goal: Will remain free from infection Outcome: Progressing Goal: Diagnostic test results will improve Outcome: Progressing Goal: Respiratory complications will improve Outcome: Progressing Goal: Cardiovascular complication will be avoided Outcome: Progressing   Problem: Activity: Goal: Risk for activity intolerance will decrease Outcome: Progressing   Problem: Nutrition: Goal: Adequate nutrition will be maintained Outcome: Progressing   Problem: Coping: Goal: Level of anxiety will decrease Outcome: Progressing   Problem: Elimination: Goal: Will not experience complications related to bowel motility Outcome: Progressing Goal: Will not experience complications related to urinary retention Outcome: Progressing   Problem: Pain Managment: Goal: General experience of comfort will improve and/or be controlled Outcome: Progressing   Problem: Safety: Goal: Ability to remain free from injury will improve Outcome: Progressing   Problem: Skin Integrity: Goal: Risk for impaired skin integrity will decrease Outcome: Progressing   Problem: Activity: Goal: Ability to tolerate increased activity will improve Outcome: Progressing   Problem: Clinical Measurements: Goal: Ability to maintain a body temperature in the normal range will improve Outcome: Progressing   Problem: Respiratory: Goal: Ability to maintain adequate ventilation will improve Outcome: Progressing Goal: Ability to maintain a clear airway will improve Outcome: Progressing   Problem: Education: Goal: Knowledge of disease and its progression will improve Outcome: Progressing   Problem: Health Behavior/Discharge Planning: Goal: Ability to manage health-related needs will improve Outcome: Progressing   Problem: Clinical Measurements: Goal: Complications related to the disease process or treatment will be avoided or  minimized Outcome: Progressing Goal: Dialysis access will remain free of complications Outcome: Progressing   Problem: Activity: Goal: Activity intolerance will improve Outcome: Progressing   Problem: Fluid Volume: Goal: Fluid volume balance will be maintained or improved Outcome: Progressing   Problem: Nutritional: Goal: Ability to make appropriate dietary choices will improve Outcome: Progressing   Problem: Respiratory: Goal: Respiratory symptoms related to disease process will be avoided Outcome: Progressing   Problem: Self-Concept: Goal: Body image disturbance will be avoided or  minimized Outcome: Progressing   Problem: Urinary Elimination: Goal: Progression of disease will be identified and treated Outcome: Progressing

## 2024-02-19 NOTE — Plan of Care (Signed)
  Problem: Fluid Volume: Goal: Ability to maintain a balanced intake and output will improve Outcome: Progressing   Problem: Skin Integrity: Goal: Risk for impaired skin integrity will decrease Outcome: Progressing   Problem: Activity: Goal: Ability to tolerate increased activity will improve Outcome: Progressing   Problem: Respiratory: Goal: Ability to maintain a clear airway and adequate ventilation will improve Outcome: Progressing   Problem: Clinical Measurements: Goal: Ability to maintain clinical measurements within normal limits will improve Outcome: Progressing

## 2024-02-19 NOTE — Progress Notes (Signed)
 [facility] [place], [place] 02/19/24  Subjective:   Hospital day # 10  Patient seen and evaluated during dialysis   HEMODIALYSIS FLOWSHEET:  Blood Flow Rate (mL/min): 400 mL/min Arterial Pressure (mmHg): -200 mmHg Venous Pressure (mmHg): 220 mmHg TMP (mmHg): -6 mmHg Ultrafiltration Rate (mL/min): 1000 mL/min Dialysate Flow Rate (mL/min): 300 ml/min Dialysis Fluid Bolus: Normal Saline Bolus Amount (mL): 100 mL  Denies shortness of breath States she feels discomfort in her chest, unsure if lungs or what.  Denies cough   Objective:  Vital signs in last 24 hours:  Temp:  [97.3 F (36.3 C)-97.6 F (36.4 C)] 97.5 F (36.4 C) (11/28 0930) Pulse Rate:  [68-84] 76 (11/28 0946) Resp:  [10-22] 12 (11/28 0946) BP: (119-145)/(66-128) 127/85 (11/28 0946) SpO2:  [96 %-100 %] 98 % (11/28 0946) FiO2 (%):  [95 %] 95 % (11/27 2055) Weight:  [67.1 kg] 67.1 kg (11/28 0930)  Weight change: -4.6 kg Filed Weights   02/18/24 0500 02/19/24 0459 02/19/24 0930  Weight: 71.2 kg 67.1 kg 67.1 kg    Intake/Output:    Intake/Output Summary (Last 24 hours) at 02/19/2024 0954 Last data filed at 02/18/2024 2212 Gross per 24 hour  Intake 521.07 ml  Output --  Net 521.07 ml     Physical Exam: General: NAD  HEENT Moist oral mucous membranes  Pulm/lungs decreased breath sounds at right base  CVS/Heart paced  Abdomen:  Soft, nontender, nondistended  Extremities: Trace edema  Neurologic: Alert, able to answer questions appropriately  Skin: Warm, dry  Access: Rt femoral HD temp cath       Basic Metabolic Panel:  Recent Labs  Lab 02/12/24 2346 02/13/24 0325 02/14/24 0429 02/16/24 0500 02/17/24 0414 02/18/24 0542 02/19/24 0500  NA 126*   < > 129* 132* 131* 138 131*  K 5.8*   < > 5.0 3.9 4.0 2.8* 6.1*  CL 92*   < > 91* 94* 95* 109 96*  CO2 17*   < > 20* 24 23 18* 25  GLUCOSE 137*   < > 66* 124* 128* 81 128*  BUN 79*   < > 88* 40* 44* 30* 43*  CREATININE  3.68*   < > 4.31* 2.35* 2.48* 1.56* 2.37*  CALCIUM  8.7*   < > 8.7* 8.4* 8.5* 6.0* 9.3  MG 2.6*  --   --   --  2.6* 1.7 2.4  PHOS  --   --   --   --   --  3.0 4.4   < > = values in this interval not displayed.     CBC: Recent Labs  Lab 02/13/24 0630 02/14/24 1415 02/17/24 1359 02/19/24 0500  WBC 9.3 7.1 7.7 7.3  NEUTROABS  --   --  5.6 3.9  HGB 13.2 12.5 12.7 13.3  HCT 40.2 37.4 38.7 40.7  MCV 86.8 85.2 87.4 88.3  PLT 172 163 178 199      Lab Results  Component Value Date   HEPBSAG NON REACTIVE 02/14/2024      Microbiology:  Recent Results (from the past 240 hours)  Blood culture (routine x 2)     Status: None   Collection Time: 02/09/24  9:27 PM   Specimen: BLOOD  Result Value Ref Range Status   Specimen Description BLOOD BLOOD RIGHT ARM  Final   Special Requests   Final    BOTTLES DRAWN AEROBIC AND ANAEROBIC Blood Culture results may not be optimal due to an inadequate volume of blood received in  culture bottles   Culture   Final    NO GROWTH 5 DAYS Performed at [facility], [address] [place], [place] [address]    Report Status 02/14/2024 FINAL  Final  Blood culture (routine x 2)     Status: None   Collection Time: 02/09/24  9:27 PM   Specimen: BLOOD  Result Value Ref Range Status   Specimen Description BLOOD BLOOD LEFT ARM  Final   Special Requests   Final    BOTTLES DRAWN AEROBIC AND ANAEROBIC Blood Culture results may not be optimal due to an inadequate volume of blood received in culture bottles   Culture   Final    NO GROWTH 5 DAYS Performed at [facility], [address] [place], [place] [address]    Report Status 02/14/2024 FINAL  Final  MRSA Next Gen by PCR, Nasal     Status: None   Collection Time: 02/10/24  1:49 AM   Specimen: Nasal Mucosa; Nasal Swab  Result Value Ref Range Status   MRSA by PCR Next Gen NOT DETECTED NOT DETECTED Final    Comment: (NOTE) The GeneXpert MRSA Assay (FDA approved for NASAL specimens  only), is one component of a comprehensive MRSA colonization surveillance program. It is not intended to diagnose MRSA infection nor to guide or monitor treatment for MRSA infections. Test performance is not FDA approved in patients less than 57 years old. Performed at [facility], [address] [place], [place] [address]   Respiratory (~20 pathogens) panel by PCR     Status: Abnormal   Collection Time: 02/10/24  1:49 AM   Specimen: Nasopharyngeal Swab; Respiratory  Result Value Ref Range Status   Adenovirus NOT DETECTED NOT DETECTED Final   Coronavirus 229E NOT DETECTED NOT DETECTED Final    Comment: (NOTE) The Coronavirus on the Respiratory Panel, DOES NOT test for the novel  Coronavirus (2019 nCoV)    Coronavirus HKU1 NOT DETECTED NOT DETECTED Final   Coronavirus NL63 NOT DETECTED NOT DETECTED Final   Coronavirus OC43 NOT DETECTED NOT DETECTED Final   Metapneumovirus NOT DETECTED NOT DETECTED Final   Rhinovirus / Enterovirus NOT DETECTED NOT DETECTED Final   Influenza A NOT DETECTED NOT DETECTED Final   Influenza B NOT DETECTED NOT DETECTED Final   Parainfluenza Virus 1 NOT DETECTED NOT DETECTED Final   Parainfluenza Virus 2 NOT DETECTED NOT DETECTED Final   Parainfluenza Virus 3 DETECTED (A) NOT DETECTED Final   Parainfluenza Virus 4 NOT DETECTED NOT DETECTED Final   Respiratory Syncytial Virus NOT DETECTED NOT DETECTED Final   Bordetella pertussis NOT DETECTED NOT DETECTED Final   Bordetella Parapertussis NOT DETECTED NOT DETECTED Final   Chlamydophila pneumoniae NOT DETECTED NOT DETECTED Final   Mycoplasma pneumoniae NOT DETECTED NOT DETECTED Final    Comment: Performed at [facility], [address], [place], [place] [address]  Resp panel by RT-PCR (RSV, Flu A&B, Covid) Anterior Nasal Swab     Status: None   Collection Time: 02/10/24  3:17 AM   Specimen: Anterior Nasal Swab  Result Value Ref Range Status   SARS Coronavirus 2 by RT PCR NEGATIVE  NEGATIVE Final    Comment: (NOTE) SARS-CoV-2 target nucleic acids are NOT DETECTED.  The SARS-CoV-2 RNA is generally detectable in upper respiratory specimens during the acute phase of infection. The lowest concentration of SARS-CoV-2 viral copies this assay can detect is 138 copies/mL. A negative result does not preclude SARS-Cov-2 infection and should not  be used as the sole basis for treatment or other patient management decisions. A negative result may occur with  improper specimen collection/handling, submission of specimen other than nasopharyngeal swab, presence of viral mutation(s) within the areas targeted by this assay, and inadequate number of viral copies(<138 copies/mL). A negative result must be combined with clinical observations, patient history, and epidemiological information. The expected result is Negative.  Fact Sheet for Patients:  bloggercourse.com  Fact Sheet for Healthcare Providers:  seriousbroker.it  This test is no t yet approved or cleared by the [place]  FDA and  has been authorized for detection and/or diagnosis of SARS-CoV-2 by FDA under an Emergency Use Authorization (EUA). This EUA will remain  in effect (meaning this test can be used) for the duration of the COVID-19 declaration under Section 564(b)(1) of the Act, 21 U.S.C.section 360bbb-3(b)(1), unless the authorization is terminated  or revoked sooner.       Influenza A by PCR NEGATIVE NEGATIVE Final   Influenza B by PCR NEGATIVE NEGATIVE Final    Comment: (NOTE) The Xpert Xpress SARS-CoV-2/FLU/RSV plus assay is intended as an aid in the diagnosis of influenza from Nasopharyngeal swab specimens and should not be used as a sole basis for treatment. Nasal washings and aspirates are unacceptable for Xpert Xpress SARS-CoV-2/FLU/RSV testing.  Fact Sheet for Patients: bloggercourse.com  Fact Sheet for Healthcare  Providers: seriousbroker.it  This test is not yet approved or cleared by the [place]  FDA and has been authorized for detection and/or diagnosis of SARS-CoV-2 by FDA under an Emergency Use Authorization (EUA). This EUA will remain in effect (meaning this test can be used) for the duration of the COVID-19 declaration under Section 564(b)(1) of the Act, 21 U.S.C. section 360bbb-3(b)(1), unless the authorization is terminated or revoked.     Resp Syncytial Virus by PCR NEGATIVE NEGATIVE Final    Comment: (NOTE) Fact Sheet for Patients: bloggercourse.com  Fact Sheet for Healthcare Providers: seriousbroker.it  This test is not yet approved or cleared by the [place]  FDA and has been authorized for detection and/or diagnosis of SARS-CoV-2 by FDA under an Emergency Use Authorization (EUA). This EUA will remain in effect (meaning this test can be used) for the duration of the COVID-19 declaration under Section 564(b)(1) of the Act, 21 U.S.C. section 360bbb-3(b)(1), unless the authorization is terminated or revoked.  Performed at [facility], [address] [place], [place] [address]   Culture, Respiratory w Gram Stain     Status: None   Collection Time: 02/10/24  7:56 AM   Specimen: Tracheal Aspirate; Respiratory  Result Value Ref Range Status   Specimen Description   Final    TRACHEAL ASPIRATE Performed at [facility], [address] [place], [place] [address]    Special Requests   Final    NONE Performed at [facility], [address] [place], [place] [address]    Gram Stain   Final    FEW WBC PRESENT,BOTH PMN AND MONONUCLEAR NO ORGANISMS SEEN    Culture   Final    NO GROWTH 2 DAYS Performed at [facility], [address], [place], [place] [address]    Report Status 02/12/2024 FINAL  Final  Body fluid culture w Gram Stain     Status: None    Collection Time: 02/11/24 12:25 PM   Specimen: Pleura; Body Fluid  Result Value Ref Range Status   Specimen Description   Final    PLEURAL Performed at [facility], [address] [place], [place] [address]    Special Requests   Final    NONE Performed at [facility], [address] [place], [place] [address]    Gram Stain   Final    WBC PRESENT, PREDOMINANTLY MONONUCLEAR NO ORGANISMS SEEN    Culture   Final    NO GROWTH 3 DAYS Performed at [facility], [address], [place], [place] [address]    Report Status 02/15/2024 FINAL  Final  Resp panel by RT-PCR (RSV, Flu A&B, Covid) Anterior Nasal Swab     Status: None   Collection Time: 02/17/24  6:19 PM   Specimen: Anterior Nasal Swab  Result Value Ref Range Status   SARS Coronavirus 2 by RT PCR NEGATIVE NEGATIVE Final    Comment: (NOTE) SARS-CoV-2 target nucleic acids are NOT DETECTED.  The SARS-CoV-2 RNA is generally detectable in upper respiratory specimens during the acute phase of infection. The lowest concentration of SARS-CoV-2 viral copies this assay can detect is 138 copies/mL. A negative result does not preclude SARS-Cov-2 infection and should not be used as the sole basis for treatment or other patient management decisions. A negative result may occur with  improper specimen collection/handling, submission of specimen other than nasopharyngeal swab, presence of viral mutation(s) within the areas targeted by this assay, and inadequate number of viral copies(<138 copies/mL). A negative result must be combined with clinical observations, patient history, and epidemiological information. The expected result is Negative.  Fact Sheet for Patients:  bloggercourse.com  Fact Sheet for Healthcare Providers:  seriousbroker.it  This test is no t yet approved or cleared by the [place]  FDA and  has been authorized for detection and/or  diagnosis of SARS-CoV-2 by FDA under an Emergency Use Authorization (EUA). This EUA will remain  in effect (meaning this test can be used) for the duration of the COVID-19 declaration under Section 564(b)(1) of the Act, 21 U.S.C.section 360bbb-3(b)(1), unless the authorization is terminated  or revoked sooner.       Influenza A by PCR NEGATIVE NEGATIVE Final   Influenza B by PCR NEGATIVE NEGATIVE Final    Comment: (NOTE) The Xpert Xpress SARS-CoV-2/FLU/RSV plus assay is intended as an aid in the diagnosis of influenza from Nasopharyngeal swab specimens and should not be used as a sole basis for treatment. Nasal washings and aspirates are unacceptable for Xpert Xpress SARS-CoV-2/FLU/RSV testing.  Fact Sheet for Patients: bloggercourse.com  Fact Sheet for Healthcare Providers: seriousbroker.it  This test is not yet approved or cleared by the [place]  FDA and has been authorized for detection and/or diagnosis of SARS-CoV-2 by FDA under an Emergency Use Authorization (EUA). This EUA will remain in effect (meaning this test can be used) for the duration of the COVID-19 declaration under Section 564(b)(1) of the Act, 21 U.S.C. section 360bbb-3(b)(1), unless the authorization is terminated or revoked.     Resp Syncytial Virus by PCR NEGATIVE NEGATIVE Final    Comment: (NOTE) Fact Sheet for Patients: bloggercourse.com  Fact Sheet for Healthcare Providers: seriousbroker.it  This test is not yet approved or cleared by the [place]  FDA and has been authorized for detection and/or diagnosis of SARS-CoV-2 by FDA under an Emergency Use Authorization (EUA). This EUA will remain in effect (meaning this test can be used) for the duration of the COVID-19 declaration under Section 564(b)(1) of the Act, 21 U.S.C. section 360bbb-3(b)(1), unless the authorization is terminated  or revoked.  Performed at [facility], [address] [place], [place] [address]     Coagulation Studies: No results for input(s): LABPROT, INR in the last 72 hours.  Urinalysis: No results for input(s): COLORURINE, LABSPEC, PHURINE, GLUCOSEU, HGBUR, BILIRUBINUR, KETONESUR, PROTEINUR, UROBILINOGEN, NITRITE, LEUKOCYTESUR in the last 72 hours.  Invalid input(s): APPERANCEUR    Imaging: DG Chest Port 1 View Result Date: 02/17/2024 CLINICAL DATA:  Status post thoracentesis of right pleural effusion. EXAM: PORTABLE CHEST 1 VIEW COMPARISON:  Chest radiograph dated 11/26 7. FINDINGS: Interval decrease in the size of the right pleural effusion post thoracentesis. No pneumothorax. Stable cardiac silhouette. Left pectoral pacemaker device. No acute osseous pathology. IMPRESSION: Interval decrease in the size of the right pleural effusion post thoracentesis. No pneumothorax. Electronically Signed   By: Vanetta Chou M.D.   On: 02/17/2024 17:24   US  THORACENTESIS ASP PLEURAL SPACE W/IMG GUIDE Result Date: 02/17/2024 INDICATION: 73 year old female with history of fluid overload, recurrent pleural effusion. Request for right thoracentesis. EXAM: ULTRASOUND GUIDED RIGHT THORACENTESIS MEDICATIONS: 10 mL 1% lidocaine  COMPLICATIONS: None immediate. PROCEDURE: An ultrasound guided thoracentesis was thoroughly discussed with the patient and questions answered. The benefits, risks, alternatives and complications were also discussed. The patient understands and wishes to proceed with the procedure. Written consent was obtained. Ultrasound was performed to localize and mark an adequate pocket of fluid in the right chest. The area was then prepped and draped in the normal sterile fashion. 1% Lidocaine  was used for local anesthesia. Under ultrasound guidance a 6 Fr Safe-T-Centesis catheter was introduced. Thoracentesis was performed. The catheter was removed and a dressing  applied. FINDINGS: A total of approximately 1.2 liters of clear, yellow fluid was removed. Samples were sent to the laboratory as requested by the clinical team. IMPRESSION: Successful ultrasound guided right thoracentesis yielding 1.2 liters of pleural fluid. Performed by: Kacie Matthews PA-C Electronically Signed   By: Wilkie Lent M.D.   On: 02/17/2024 15:59   DG Chest Port 1 View Result Date: 02/17/2024 EXAM: 1 VIEW(S) XRAY OF THE CHEST 02/17/2024 12:17:00 PM COMPARISON: 02/14/2024 CLINICAL HISTORY: Dyspnea FINDINGS: LINES, TUBES AND DEVICES: Left chest cardiac pacing device noted. LUNGS AND PLEURA: Moderate pleural effusion has increased since the recent radiograph. There are increased right mid lung and basilar opacities likely reflecting atelectasis versus infection. No pneumothorax. HEART AND MEDIASTINUM: Cardiomegaly, unchanged. Atherosclerotic calcifications. BONES AND SOFT TISSUES: No acute osseous abnormality. IMPRESSION: 1. Moderate right pleural effusion, increased since the recent radiograph. 2. Increased right mid lung and basilar opacities, likely reflecting atelectasis versus infection. Electronically signed by: Donnice Mania MD 02/17/2024 01:10 PM EST RP Workstation: [id number]      Medications:      sodium chloride    Intravenous Once   albuterol   2.5 mg Nebulization Once   apixaban   5 mg Oral BID   vitamin C   500 mg Oral BID   carvedilol   6.25 mg Oral BID WC   Chlorhexidine  Gluconate Cloth  6 each Topical Daily   feeding supplement  237 mL Oral BID BM   furosemide   60 mg Intravenous Q12H   gabapentin   100 mg Oral BID   insulin  aspart  0-6 Units Subcutaneous TID WC   ipratropium-albuterol   3 mL Nebulization BID   lidocaine   1 patch Transdermal Q24H   multivitamin  1 tablet Oral QHS   multivitamin with minerals  1 tablet Oral Daily   sodium chloride  flush  3 mL Intravenous Q12H   acetaminophen , alum & mag hydroxide-simeth, benzonatate , hydrALAZINE , HYDROcodone   bit-homatropine, hydrocortisone  cream,  ipratropium-albuterol , menthol , mouth rinse, mouth rinse, oxyCODONE , polyethylene glycol, prochlorperazine , promethazine , sodium chloride   Assessment/ Plan:  73 y.o. 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 Pneumonia treated with Unasyn  Renal US  negative for hydronephrosis. Developed respiratory arrest after fentanyl  and Versed  for PermCath placement  Plan: Receiving dialysis today, UF 1L as tolerated. Next treatment scheduled for Tuesday, per proposed outpatient schedule.    Hyperkalemia Potassium 6.1, received shifting measures earlier this morning. Will continue to correct with dialysis. Will obtain pre-dialysis potassium level and adjust potassium bath accordingly.    Acute metabolic acidosis Likely secondary to renal insufficiency. Serum bicarbonate corrected, 25   Hyponatremia Likely secondary to AKI. S sodium 131   Chronic kidney disease, stage IIIb.  Baseline creatinine 1.4/GFR 38 from 02/10/2024.  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 Will continue with dialysis and monitor for renal recovery   Comorbidities Chronic systolic CHF-2D echo from 02/10/2024 show LVEF 30 to 35%, global hypokinesis, moderately reduced right ventricular systolic function   Acute pulmonary embolism-patient is currently on apixaban  5 mg twice a day   Right pleural effusion- Pulmonary and cardiac teams  are following. Thoracentesis this admission       LOS: 10 Faith Harris 11/28/20259:54 AM  [address] [place], [place] [phone]

## 2024-02-20 DIAGNOSIS — J189 Pneumonia, unspecified organism: Secondary | ICD-10-CM | POA: Diagnosis not present

## 2024-02-20 DIAGNOSIS — I469 Cardiac arrest, cause unspecified: Secondary | ICD-10-CM | POA: Diagnosis not present

## 2024-02-20 DIAGNOSIS — Z4901 Encounter for fitting and adjustment of extracorporeal dialysis catheter: Secondary | ICD-10-CM | POA: Diagnosis not present

## 2024-02-20 DIAGNOSIS — N179 Acute kidney failure, unspecified: Secondary | ICD-10-CM | POA: Diagnosis not present

## 2024-02-20 LAB — CBC WITH DIFFERENTIAL/PLATELET
Abs Immature Granulocytes: 0.09 K/uL — ABNORMAL HIGH (ref 0.00–0.07)
Basophils Absolute: 0.1 K/uL (ref 0.0–0.1)
Basophils Relative: 1 %
Eosinophils Absolute: 0.5 K/uL (ref 0.0–0.5)
Eosinophils Relative: 6 %
HCT: 40.5 % (ref 36.0–46.0)
Hemoglobin: 13.1 g/dL (ref 12.0–15.0)
Immature Granulocytes: 1 %
Lymphocytes Relative: 20 %
Lymphs Abs: 1.7 K/uL (ref 0.7–4.0)
MCH: 28.7 pg (ref 26.0–34.0)
MCHC: 32.3 g/dL (ref 30.0–36.0)
MCV: 88.8 fL (ref 80.0–100.0)
Monocytes Absolute: 0.9 K/uL (ref 0.1–1.0)
Monocytes Relative: 11 %
Neutro Abs: 5.1 K/uL (ref 1.7–7.7)
Neutrophils Relative %: 61 %
Platelets: 198 K/uL (ref 150–400)
RBC: 4.56 MIL/uL (ref 3.87–5.11)
RDW: 17.6 % — ABNORMAL HIGH (ref 11.5–15.5)
Smear Review: NORMAL
WBC: 8.2 K/uL (ref 4.0–10.5)
nRBC: 4.2 % — ABNORMAL HIGH (ref 0.0–0.2)

## 2024-02-20 LAB — GLUCOSE, CAPILLARY
Glucose-Capillary: 132 mg/dL — ABNORMAL HIGH (ref 70–99)
Glucose-Capillary: 213 mg/dL — ABNORMAL HIGH (ref 70–99)
Glucose-Capillary: 171 mg/dL — ABNORMAL HIGH (ref 70–99)
Glucose-Capillary: 154 mg/dL — ABNORMAL HIGH (ref 70–99)

## 2024-02-20 LAB — COMPREHENSIVE METABOLIC PANEL WITH GFR
ALT: 75 U/L — ABNORMAL HIGH (ref 0–44)
AST: 54 U/L — ABNORMAL HIGH (ref 15–41)
Albumin: 3.2 g/dL — ABNORMAL LOW (ref 3.5–5.0)
Alkaline Phosphatase: 79 U/L (ref 38–126)
Anion gap: 9 (ref 5–15)
BUN: 28 mg/dL — ABNORMAL HIGH (ref 8–23)
CO2: 27 mmol/L (ref 22–32)
Calcium: 8.8 mg/dL — ABNORMAL LOW (ref 8.9–10.3)
Chloride: 95 mmol/L — ABNORMAL LOW (ref 98–111)
Creatinine, Ser: 1.66 mg/dL — ABNORMAL HIGH (ref 0.44–1.00)
GFR, Estimated: 32 mL/min — ABNORMAL LOW (ref >60)
Glucose, Bld: 148 mg/dL — ABNORMAL HIGH (ref 70–99)
Potassium: 4.8 mmol/L (ref 3.5–5.1)
Sodium: 131 mmol/L — ABNORMAL LOW (ref 135–145)
Total Bilirubin: 0.7 mg/dL (ref 0.0–1.2)
Total Protein: 6.8 g/dL (ref 6.5–8.1)

## 2024-02-20 LAB — PHOSPHORUS: Phosphorus: 3.4 mg/dL (ref 2.5–4.6)

## 2024-02-20 LAB — MAGNESIUM: Magnesium: 2 mg/dL (ref 1.7–2.4)

## 2024-02-20 NOTE — Progress Notes (Signed)
 [facility], [place] 02/20/24  Subjective:   Hospital day # 11  Patient seen sitting at bedside Alert and oriented Room air No lower extremity edema   Objective:  Vital signs in last 24 hours:  Temp:  [97.7 F (36.5 C)-98.6 F (37 C)] 98 F (36.7 C) (11/29 0847) Pulse Rate:  [76-86] 86 (11/29 0847) Resp:  [16-18] 16 (11/29 0847) BP: (113-149)/(77-96) 113/96 (11/29 0847) SpO2:  [98 %-100 %] 99 % (11/29 0448) Weight:  [64.2 kg-73 kg] 73 kg (11/29 0506)  Weight change: 0 kg Filed Weights   02/19/24 0930 02/19/24 1304 02/20/24 0506  Weight: 67.1 kg 64.2 kg 73 kg    Intake/Output:    Intake/Output Summary (Last 24 hours) at 02/20/2024 1239 Last data filed at 02/20/2024 1047 Gross per 24 hour  Intake 220 ml  Output 1000 ml  Net -780 ml     Physical Exam: General: NAD  HEENT Moist oral mucous membranes  Pulm/lungs decreased breath sounds at right base  CVS/Heart paced  Abdomen:  Soft, nontender, nondistended  Extremities: Trace edema  Neurologic: Alert, able to answer questions appropriately  Skin: Warm, dry  Access: Rt femoral HD temp cath       Basic Metabolic Panel:  Recent Labs  Lab 02/16/24 0500 02/17/24 0414 02/18/24 0542 02/19/24 0500 02/19/24 0940 02/20/24 0354  NA 132* 131* 138 131*  --  131*  K 3.9 4.0 2.8* 6.1* 4.2 4.8  CL 94* 95* 109 96*  --  95*  CO2 24 23 18* 25  --  27  GLUCOSE 124* 128* 81 128*  --  148*  BUN 40* 44* 30* 43*  --  28*  CREATININE 2.35* 2.48* 1.56* 2.37*  --  1.66*  CALCIUM  8.4* 8.5* 6.0* 9.3  --  8.8*  MG  --  2.6* 1.7 2.4  --  2.0  PHOS  --   --  3.0 4.4  --  3.4     CBC: Recent Labs  Lab 02/14/24 1415 02/17/24 1359 02/19/24 0500 02/20/24 0354  WBC 7.1 7.7 7.3 8.2  NEUTROABS  --  5.6 3.9 5.1  HGB 12.5 12.7 13.3 13.1  HCT 37.4 38.7 40.7 40.5  MCV 85.2 87.4 88.3 88.8  PLT 163 178 199 198      Lab Results  Component Value Date   HEPBSAG NON REACTIVE 02/14/2024       Microbiology:  Recent Results (from the past 240 hours)  Body fluid culture w Gram Stain     Status: None   Collection Time: 02/11/24 12:25 PM   Specimen: Pleura; Body Fluid  Result Value Ref Range Status   Specimen Description   Final    PLEURAL Performed at [facility], [address] [place], [place] [address]    Special Requests   Final    NONE Performed at [facility], [address] [place], [place] [address]    Gram Stain   Final    WBC PRESENT, PREDOMINANTLY MONONUCLEAR NO ORGANISMS SEEN    Culture   Final    NO GROWTH 3 DAYS Performed at [facility], [address], [place], [place] [address]    Report Status 02/15/2024 FINAL  Final  Resp panel by RT-PCR (RSV, Flu A&B, Covid) Anterior Nasal Swab     Status: None   Collection Time: 02/17/24  6:19 PM   Specimen: Anterior Nasal Swab  Result Value Ref Range Status   SARS Coronavirus 2 by  RT PCR NEGATIVE NEGATIVE Final    Comment: (NOTE) SARS-CoV-2 target nucleic acids are NOT DETECTED.  The SARS-CoV-2 RNA is generally detectable in upper respiratory specimens during the acute phase of infection. The lowest concentration of SARS-CoV-2 viral copies this assay can detect is 138 copies/mL. A negative result does not preclude SARS-Cov-2 infection and should not be used as the sole basis for treatment or other patient management decisions. A negative result may occur with  improper specimen collection/handling, submission of specimen other than nasopharyngeal swab, presence of viral mutation(s) within the areas targeted by this assay, and inadequate number of viral copies(<138 copies/mL). A negative result must be combined with clinical observations, patient history, and epidemiological information. The expected result is Negative.  Fact Sheet for Patients:  bloggercourse.com  Fact Sheet for Healthcare Providers:   seriousbroker.it  This test is no t yet approved or cleared by the [place]  FDA and  has been authorized for detection and/or diagnosis of SARS-CoV-2 by FDA under an Emergency Use Authorization (EUA). This EUA will remain  in effect (meaning this test can be used) for the duration of the COVID-19 declaration under Section 564(b)(1) of the Act, 21 U.S.C.section 360bbb-3(b)(1), unless the authorization is terminated  or revoked sooner.       Influenza A by PCR NEGATIVE NEGATIVE Final   Influenza B by PCR NEGATIVE NEGATIVE Final    Comment: (NOTE) The Xpert Xpress SARS-CoV-2/FLU/RSV plus assay is intended as an aid in the diagnosis of influenza from Nasopharyngeal swab specimens and should not be used as a sole basis for treatment. Nasal washings and aspirates are unacceptable for Xpert Xpress SARS-CoV-2/FLU/RSV testing.  Fact Sheet for Patients: bloggercourse.com  Fact Sheet for Healthcare Providers: seriousbroker.it  This test is not yet approved or cleared by the [place]  FDA and has been authorized for detection and/or diagnosis of SARS-CoV-2 by FDA under an Emergency Use Authorization (EUA). This EUA will remain in effect (meaning this test can be used) for the duration of the COVID-19 declaration under Section 564(b)(1) of the Act, 21 U.S.C. section 360bbb-3(b)(1), unless the authorization is terminated or revoked.     Resp Syncytial Virus by PCR NEGATIVE NEGATIVE Final    Comment: (NOTE) Fact Sheet for Patients: bloggercourse.com  Fact Sheet for Healthcare Providers: seriousbroker.it  This test is not yet approved or cleared by the [place]  FDA and has been authorized for detection and/or diagnosis of SARS-CoV-2 by FDA under an Emergency Use Authorization (EUA). This EUA will remain in effect (meaning this test can be used) for  the duration of the COVID-19 declaration under Section 564(b)(1) of the Act, 21 U.S.C. section 360bbb-3(b)(1), unless the authorization is terminated or revoked.  Performed at [facility], [address] [place], [place] [address]     Coagulation Studies: No results for input(s): LABPROT, INR in the last 72 hours.  Urinalysis: No results for input(s): COLORURINE, LABSPEC, PHURINE, GLUCOSEU, HGBUR, BILIRUBINUR, KETONESUR, PROTEINUR, UROBILINOGEN, NITRITE, LEUKOCYTESUR in the last 72 hours.  Invalid input(s): APPERANCEUR    Imaging: No results found.     Medications:      sodium chloride    Intravenous Once   albuterol   2.5 mg Nebulization Once   apixaban   5 mg Oral BID   vitamin C   500 mg Oral BID   carvedilol   6.25 mg Oral BID WC   Chlorhexidine  Gluconate Cloth  6 each Topical Daily   feeding supplement  237 mL Oral BID BM   gabapentin   100  mg Oral BID   insulin  aspart  0-6 Units Subcutaneous TID WC   lidocaine   1 patch Transdermal Q24H   multivitamin  1 tablet Oral QHS   multivitamin with minerals  1 tablet Oral Daily   sodium chloride  flush  3 mL Intravenous Q12H   acetaminophen , alum & mag hydroxide-simeth, benzonatate , hydrALAZINE , HYDROcodone  bit-homatropine, hydrocortisone  cream, ipratropium-albuterol , menthol , mouth rinse, mouth rinse, oxyCODONE , polyethylene glycol, prochlorperazine , promethazine , sodium chloride   Assessment/ Plan:  73 y.o. female with  medical problems of   PAF, PE on Xarelto , chronic HFrEF LVEF 25% with recurrent right-sided pleural effusion, PPM-ICD, HTN, IDDM, gout   admitted on 02/09/2024 for Cardiac arrest (HCC) [I46.9] CAP (community acquired pneumonia) [J18.9] Dyspnea, unspecified type [R06.00] Community acquired pneumonia, unspecified laterality [J18.9]  Complicated by PEA cardiac arrest, pulmonary embolism, large right pleural effusion, parainfluenza virus 3   AKI volume overload Acute  kidney injury, oliguric, likely due to to ATN secondary to hypotension, IV contrast exposure, possible pneumonia. Pneumonia treated with Unasyn  Renal US  negative for hydronephrosis. Developed respiratory arrest after fentanyl  and Versed  for PermCath placement  Plan: Received dialysis yesterday, UF 1L as tolerated. Next treatment scheduled for Tuesday. Requested nursing to document all urine output.  Will request permcath placement on Monday   Hyperkalemia Potassium 4.8. Will continue to manage with dialysis.    Acute metabolic acidosis Likely secondary to renal insufficiency. Serum bicarbonate corrected.   Hyponatremia Likely secondary to AKI. S sodium 131   Chronic kidney disease, stage IIIb.  Baseline creatinine 1.4/GFR 38 from 02/10/2024.  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 Will continue with dialysis and monitor for renal recovery   Comorbidities Chronic systolic CHF-2D echo from 02/10/2024 show LVEF 30 to 35%, global hypokinesis, moderately reduced right ventricular systolic function   Acute pulmonary embolism-patient is currently on apixaban  5 mg twice a day   Right pleural effusion- Pulmonary and cardiac teams are following. Thoracentesis this admission       LOS: 11 [facility] Marciano Mundt 11/29/202512:39 PM  [facility] [place], [place] [phone]

## 2024-02-20 NOTE — Progress Notes (Signed)
 PROGRESS NOTE    Deborah Dawson  FMW:[id number] DOB: Dec 08, 1950 DOA: 02/09/2024 PCP: Sadie Manna, MD  Chief Complaint  Patient presents with   Shortness of Breath    Hospital Course:  [name] 73 year old female with extensive past medical history including paroxysmal A-fib, prior pulmonary embolism, chronic heart failure with reduced EF LVEF 25%, recurrent right-sided pleural effusions, permanent pacemaker placement, hypertension, diabetes, who presented to the ED for evaluation of shortness of breath.  She was admitted earlier this month for acute on chronic systolic CHF with right-sided pleural effusion for which she underwent thoracentesis on 11/4, 900 cc removed.  She underwent additional thoracentesis 11/7 additional 700 cc removed.  At that time she was found have a small pulmonary embolism and she was switched from Eliquis  to Xarelto . This admission while patient was admitting room placement in the ED she became suddenly unresponsive and was found to be in PEA.  She underwent 1 round of CPR and ACLS prior to ROSC.  She then became obtunded and was emergently intubated for airway protection.  About 20 minutes after this event she went into PEA again and received another round of CPR and then achieved ROSC.  She was then transferred to the ICU.  She was extubated 11/20 and transferred back to [facility] on 11/21. Stay has been further complicated by severe AKI and metabolic acidosis.  Patient was started on hemodialysis on 11/23.  Patient underwent attempted permacath placement on 11/25.  During permacath placement she received Versed  and fentanyl  and subsequently underwent respiratory arrest.  She required Narcan  before returning to spontaneous respirations and then was transferred to the ICU. Patient became increasingly dyspneic on 11/27 and CXR revealed worsening right-sided pleural effusion.  She underwent thoracentesis 1.2 L fluid removed. Patient tolerated dialysis well on 11/28.  She  continues to have significant improvement.  She still pending permacath placement  Subjective: No acute events overnight.  Patient reports that she is still urinating some.  Her upper extremity edema has also improved significantly today.  She tolerated dialysis yesterday without issue.  No acute complaints at this time   Objective: Vitals:   02/19/24 2121 02/20/24 0448 02/20/24 0506 02/20/24 0847  BP: 138/88 115/77  (!) 113/96  Pulse: 86 79  86  Resp: 16 16  16   Temp: 98 F (36.7 C) 98 F (36.7 C)  98 F (36.7 C)  TempSrc:    Oral  SpO2: 99% 99%    Weight:   73 kg   Height:        Intake/Output Summary (Last 24 hours) at 02/20/2024 1354 Last data filed at 02/20/2024 1047 Gross per 24 hour  Intake 220 ml  Output --  Net 220 ml   Filed Weights   02/19/24 0930 02/19/24 1304 02/20/24 0506  Weight: 67.1 kg 64.2 kg 73 kg    Examination: General exam: Acute distress, nontoxic-appearing, appears more energetic than prior evaluations Respiratory system: No work of breathing, breathing easily, on 2 L. cardiovascular system: S1 & S2 heard, RRR.  Gastrointestinal system: Abdomen is nondistended, soft and nontender.  Neuro: Alert and oriented. No focal neurological deficits  Assessment & Plan:  Principal Problem:   CAP (community acquired pneumonia) Active Problems:   Acute renal failure   Encounter for dialysis and dialysis catheter care   Cardiac arrest (HCC)   Pressure injury of skin     PEA arrest x2 -Patient has extensive cardiac history including significantly reduced EF and PPM ICD in place. -  Continue monitoring on telemetry ICU - Cardiology consulted, saw patient once on 11/26.  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  No plans for inpatient ischemic workup at this time.  Appreciate any follow-up care. - Continue monitoring on telemetry.  Has been stable.  Respiratory arrest - Occurred after receiving 2 mg Versed  and 50 mcg of fentanyl  as  conscious sedation appropriation for permacath.  Patient was given Narcan  and bagged.  Return to spontaneous respiration. - Continue monitoring respiratory status closely - Currently on nasal cannula and O2 sats are 99%  Acute hypoxic respiratory failure  Pulm edema Moderate to large pleural effusion  -CT on arrival showed diffuse bronchial wall thickening with mucous plugging, later showed rapid development of pulmonary edema and pleural effusions - 11/27 1.2 L thoracentesis off the right lung.  This is third thoracentesis this month. - Ideally additional volume management through hemodialysis - Patient has had minimal urinary output, have discontinued Lasix  for now - Continue to support respirations with saline nebs twice daily and DuoNebs  Parainfluenza virus 3 infection Continue supportive care with nebulizers, Tessalon  - Flu/COVID/RSV negative multiple times this month   Possible PNA -Status post 7 days Unasyn  - CXR 11/27 with worsening pleural effusion and atelectasis.  Could be obscuring pneumonia.  No fever presently.  No WBC elevation.  Acute on chronic Heart failure with reduced EF 20 to 25%  PPM-ICD -Hemodialysis now for volume removal, tolerating dialysis better now. Dialysis on Tuesday -EF this admission 30 to 35%, global hypokinesis   Oliguric AKI superimposed on CKD stage IIIb, has now progressed to ESRD quiring renal replacement therapy Metabolic acidosis -May be secondary to ATN from hypotension as well as IV contrast exposure, unclear if she will make significant renal recovery - Nephrology consulted - Status post bicarb drip - Started on hemodialysis 11/23 - Attempted place permacath 11/25 but procedure was aborted due to respiratory arrest. she will still need this prior to discharge, likely early next week - Strict I's and O's, patient endorses urination though minimal output is recorded.  Have asked RN to record - Next HD planned for Tuesday.  Will monitor CMP  closely  Electrolyte derangements Hypocalcemia - Status post replacement.  Improved now.  Continue to monitor daily CMP  Hypokalemia - Replace as needed - Follow-up CMP  Mild hyperkalemia -Status post shifters and hemodialysis - Improved on repeat   History of PAF  - Continue carvedilol  --switched from Xarelto  to Eliquis  due to kidney function  Hx of PE --switched from Xarelto  to Eliquis  due to kidney function --cont Eliquis    DM2 Hypoglycemia --hypoglycemia due to poor oral intake -- Sliding scale very sensitive.  Discontinue basal insulin   Severely deconditioned Poor prognosis - Patient is starting to make recovery now.  Suspect she will be very deconditioned and would benefit from SNF stay.  Proceed with PT/OT.  Initially dietitian was recommending Dobbhoff tube for nutrition but she is now maintaining her diet p.o.   - Have consulted palliative care for ongoing GOC conversations.  Presently patient desires full code and aggressive measures   Acute 7th rib fracture --Acute minimally displaced fracture of the left anterolateral seventh rib.  --from CPR during codes --pt doesn't want to take opioid pain meds. --tylenol  PRN  Body mass index is 30.42 kg/m. - Outpatient follow up for lifestyle modification and risk factor management   DVT prophylaxis: Eliquis    Code Status: Full Code Disposition: Continue to monitor on telemetry.  Pending significant clinical improvement  Consultants:  Procedures:  attempted permacath 11/25  Antimicrobials:  Anti-infectives (From admission, onward)    Start     Dose/Rate Route Frequency Ordered Stop   02/16/24 0915  ceFAZolin  (ANCEF ) IVPB 1 g/50 mL premix  Status:  Discontinued        1 g 100 mL/hr over 30 Minutes Intravenous 30 min pre-op 02/16/24 0916 02/16/24 1232   02/11/24 1200  Ampicillin -Sulbactam (UNASYN ) 3 g in sodium chloride  0.9 % 100 mL IVPB  Status:  Discontinued        3 g 200 mL/hr over 30 Minutes  Intravenous Every 12 hours 02/11/24 0749 02/16/24 1922   02/10/24 2230  azithromycin  (ZITHROMAX ) 500 mg in sodium chloride  0.9 % 250 mL IVPB  Status:  Discontinued        500 mg 250 mL/hr over 60 Minutes Intravenous Every 24 hours 02/10/24 0146 02/10/24 1016   02/10/24 0245  Ampicillin -Sulbactam (UNASYN ) 3 g in sodium chloride  0.9 % 100 mL IVPB  Status:  Discontinued        3 g 200 mL/hr over 30 Minutes Intravenous Every 6 hours 02/10/24 0158 02/11/24 0749   02/09/24 2115  cefTRIAXone  (ROCEPHIN ) 1 g in sodium chloride  0.9 % 100 mL IVPB        1 g 200 mL/hr over 30 Minutes Intravenous  Once 02/09/24 2108 02/09/24 2231   02/09/24 2115  azithromycin  (ZITHROMAX ) 500 mg in sodium chloride  0.9 % 250 mL IVPB        500 mg 250 mL/hr over 60 Minutes Intravenous  Once 02/09/24 2108 02/09/24 2336       Data Reviewed: I have personally reviewed following labs and imaging studies CBC: Recent Labs  Lab 02/14/24 1415 02/17/24 1359 02/19/24 0500 02/20/24 0354  WBC 7.1 7.7 7.3 8.2  NEUTROABS  --  5.6 3.9 5.1  HGB 12.5 12.7 13.3 13.1  HCT 37.4 38.7 40.7 40.5  MCV 85.2 87.4 88.3 88.8  PLT 163 178 199 198   Basic Metabolic Panel: Recent Labs  Lab 02/16/24 0500 02/17/24 0414 02/18/24 0542 02/19/24 0500 02/19/24 0940 02/20/24 0354  NA 132* 131* 138 131*  --  131*  K 3.9 4.0 2.8* 6.1* 4.2 4.8  CL 94* 95* 109 96*  --  95*  CO2 24 23 18* 25  --  27  GLUCOSE 124* 128* 81 128*  --  148*  BUN 40* 44* 30* 43*  --  28*  CREATININE 2.35* 2.48* 1.56* 2.37*  --  1.66*  CALCIUM  8.4* 8.5* 6.0* 9.3  --  8.8*  MG  --  2.6* 1.7 2.4  --  2.0  PHOS  --   --  3.0 4.4  --  3.4   GFR: Estimated Creatinine Clearance: 26.8 mL/min (A) (by C-G formula based on SCr of 1.66 mg/dL (H)). Liver Function Tests: Recent Labs  Lab 02/18/24 0542 02/19/24 0500 02/20/24 0354  AST  --  76* 54*  ALT  --  97* 75*  ALKPHOS  --  81 79  BILITOT  --  0.8 0.7  PROT  --  6.9 6.8  ALBUMIN 2.2* 3.4* 3.2*   CBG: Recent  Labs  Lab 02/19/24 1749 02/19/24 2123 02/20/24 0850 02/20/24 1205 02/20/24 1208  GLUCAP 194* 176* 132* 134* 213*    Recent Results (from the past 240 hours)  Body fluid culture w Gram Stain     Status: None   Collection Time: 02/11/24 12:25 PM   Specimen: Pleura; Body Fluid  Result Value Ref Range  Status   Specimen Description   Final    PLEURAL Performed at [facility], [address] [place], [place] [address]    Special Requests   Final    NONE Performed at [facility], [address] [place], [place] [address]    Gram Stain   Final    WBC PRESENT, PREDOMINANTLY MONONUCLEAR NO ORGANISMS SEEN    Culture   Final    NO GROWTH 3 DAYS Performed at [facility], [address] [place], [place] [address]    Report Status 02/15/2024 FINAL  Final  Resp panel by RT-PCR (RSV, Flu A&B, Covid) Anterior Nasal Swab     Status: None   Collection Time: 02/17/24  6:19 PM   Specimen: Anterior Nasal Swab  Result Value Ref Range Status   SARS Coronavirus 2 by RT PCR NEGATIVE NEGATIVE Final    Comment: (NOTE) SARS-CoV-2 target nucleic acids are NOT DETECTED.  The SARS-CoV-2 RNA is generally detectable in upper respiratory specimens during the acute phase of infection. The lowest concentration of SARS-CoV-2 viral copies this assay can detect is 138 copies/mL. A negative result does not preclude SARS-Cov-2 infection and should not be used as the sole basis for treatment or other patient management decisions. A negative result may occur with  improper specimen collection/handling, submission of specimen other than nasopharyngeal swab, presence of viral mutation(s) within the areas targeted by this assay, and inadequate number of viral copies(<138 copies/mL). A negative result must be combined with clinical observations, patient history, and epidemiological information. The expected result is Negative.  Fact Sheet for Patients:   bloggercourse.com  Fact Sheet for Healthcare Providers:  seriousbroker.it  This test is no t yet approved or cleared by the [place]  FDA and  has been authorized for detection and/or diagnosis of SARS-CoV-2 by FDA under an Emergency Use Authorization (EUA). This EUA will remain  in effect (meaning this test can be used) for the duration of the COVID-19 declaration under Section 564(b)(1) of the Act, 21 U.S.C.section 360bbb-3(b)(1), unless the authorization is terminated  or revoked sooner.       Influenza A by PCR NEGATIVE NEGATIVE Final   Influenza B by PCR NEGATIVE NEGATIVE Final    Comment: (NOTE) The Xpert Xpress SARS-CoV-2/FLU/RSV plus assay is intended as an aid in the diagnosis of influenza from Nasopharyngeal swab specimens and should not be used as a sole basis for treatment. Nasal washings and aspirates are unacceptable for Xpert Xpress SARS-CoV-2/FLU/RSV testing.  Fact Sheet for Patients: bloggercourse.com  Fact Sheet for Healthcare Providers: seriousbroker.it  This test is not yet approved or cleared by the [place]  FDA and has been authorized for detection and/or diagnosis of SARS-CoV-2 by FDA under an Emergency Use Authorization (EUA). This EUA will remain in effect (meaning this test can be used) for the duration of the COVID-19 declaration under Section 564(b)(1) of the Act, 21 U.S.C. section 360bbb-3(b)(1), unless the authorization is terminated or revoked.     Resp Syncytial Virus by PCR NEGATIVE NEGATIVE Final    Comment: (NOTE) Fact Sheet for Patients: bloggercourse.com  Fact Sheet for Healthcare Providers: seriousbroker.it  This test is not yet approved or cleared by the [place]  FDA and has been authorized for detection and/or diagnosis of SARS-CoV-2 by FDA under an Emergency Use  Authorization (EUA). This EUA will remain in effect (meaning this test can be used) for the duration of the COVID-19 declaration under Section 564(b)(1) of the Act, 21 U.S.C. section  360bbb-3(b)(1), unless the authorization is terminated or revoked.  Performed at [facility], [address], [place], [place] [address]      Radiology Studies: No results found.   Scheduled Meds:  sodium chloride    Intravenous Once   albuterol   2.5 mg Nebulization Once   apixaban   5 mg Oral BID   vitamin C   500 mg Oral BID   carvedilol   6.25 mg Oral BID WC   Chlorhexidine  Gluconate Cloth  6 each Topical Daily   feeding supplement  237 mL Oral BID BM   gabapentin   100 mg Oral BID   insulin  aspart  0-6 Units Subcutaneous TID WC   lidocaine   1 patch Transdermal Q24H   multivitamin  1 tablet Oral QHS   multivitamin with minerals  1 tablet Oral Daily   sodium chloride  flush  3 mL Intravenous Q12H   Continuous Infusions:   LOS: 11 days  CRITICAL CARETotal critical care time: 65 minutes Critical care time was exclusive of separately billable procedures and treating other patients. Critical care was necessary to treat or prevent imminent or life-threatening deterioration. Critical care was time spent personally by me on the following activities: development of treatment plan with patient and/or surrogate as well as nursing, discussions with consultants, evaluation of patient's response to treatment, examination of patient, obtaining history from patient or surrogate, ordering and performing treatments and interventions, ordering and review of laboratory studies, ordering and review of radiographic studies, pulse oximetry and re-evaluation of patient's condition.   Aileena Iglesia, DO [facility]  To contact the attending physician between 7A-7P please use Epic Chat. To contact the covering physician during after hours 7P-7A, please review Amion.  02/20/2024, 1:54 PM   *This  document has been created with the assistance of dictation software. Please excuse typographical errors. *

## 2024-02-21 DIAGNOSIS — I469 Cardiac arrest, cause unspecified: Secondary | ICD-10-CM | POA: Diagnosis not present

## 2024-02-21 DIAGNOSIS — J189 Pneumonia, unspecified organism: Secondary | ICD-10-CM | POA: Diagnosis not present

## 2024-02-21 DIAGNOSIS — Z4901 Encounter for fitting and adjustment of extracorporeal dialysis catheter: Secondary | ICD-10-CM | POA: Diagnosis not present

## 2024-02-21 DIAGNOSIS — N179 Acute kidney failure, unspecified: Secondary | ICD-10-CM | POA: Diagnosis not present

## 2024-02-21 LAB — GLUCOSE, CAPILLARY
Glucose-Capillary: 144 mg/dL — ABNORMAL HIGH (ref 70–99)
Glucose-Capillary: 183 mg/dL — ABNORMAL HIGH (ref 70–99)
Glucose-Capillary: 200 mg/dL — ABNORMAL HIGH (ref 70–99)
Glucose-Capillary: 174 mg/dL — ABNORMAL HIGH (ref 70–99)

## 2024-02-21 LAB — CBC WITH DIFFERENTIAL/PLATELET
Abs Immature Granulocytes: 0.06 K/uL (ref 0.00–0.07)
Basophils Absolute: 0.1 K/uL (ref 0.0–0.1)
Basophils Relative: 1 %
Eosinophils Absolute: 0.4 K/uL (ref 0.0–0.5)
Eosinophils Relative: 5 %
HCT: 40.8 % (ref 36.0–46.0)
Hemoglobin: 13.4 g/dL (ref 12.0–15.0)
Immature Granulocytes: 1 %
Lymphocytes Relative: 18 %
Lymphs Abs: 1.5 K/uL (ref 0.7–4.0)
MCH: 28.9 pg (ref 26.0–34.0)
MCHC: 32.8 g/dL (ref 30.0–36.0)
MCV: 88.1 fL (ref 80.0–100.0)
Monocytes Absolute: 0.9 K/uL (ref 0.1–1.0)
Monocytes Relative: 11 %
Neutro Abs: 5.7 K/uL (ref 1.7–7.7)
Neutrophils Relative %: 64 %
Platelets: 195 K/uL (ref 150–400)
RBC: 4.63 MIL/uL (ref 3.87–5.11)
RDW: 17.8 % — ABNORMAL HIGH (ref 11.5–15.5)
WBC: 8.6 K/uL (ref 4.0–10.5)
nRBC: 2.8 % — ABNORMAL HIGH (ref 0.0–0.2)

## 2024-02-21 LAB — COMPREHENSIVE METABOLIC PANEL WITH GFR
ALT: 64 U/L — ABNORMAL HIGH (ref 0–44)
AST: 39 U/L (ref 15–41)
Albumin: 3.4 g/dL — ABNORMAL LOW (ref 3.5–5.0)
Alkaline Phosphatase: 77 U/L (ref 38–126)
Anion gap: 10 (ref 5–15)
BUN: 33 mg/dL — ABNORMAL HIGH (ref 8–23)
CO2: 27 mmol/L (ref 22–32)
Calcium: 9 mg/dL (ref 8.9–10.3)
Chloride: 92 mmol/L — ABNORMAL LOW (ref 98–111)
Creatinine, Ser: 1.82 mg/dL — ABNORMAL HIGH (ref 0.44–1.00)
GFR, Estimated: 28 mL/min — ABNORMAL LOW (ref >60)
Glucose, Bld: 164 mg/dL — ABNORMAL HIGH (ref 70–99)
Potassium: 5.5 mmol/L — ABNORMAL HIGH (ref 3.5–5.1)
Sodium: 129 mmol/L — ABNORMAL LOW (ref 135–145)
Total Bilirubin: 0.8 mg/dL (ref 0.0–1.2)
Total Protein: 6.7 g/dL (ref 6.5–8.1)

## 2024-02-21 LAB — PHOSPHORUS: Phosphorus: 4.2 mg/dL (ref 2.5–4.6)

## 2024-02-21 LAB — MAGNESIUM: Magnesium: 2.1 mg/dL (ref 1.7–2.4)

## 2024-02-21 MED ORDER — SODIUM ZIRCONIUM CYCLOSILICATE 10 G PO PACK
10.0000 g | PACK | Freq: Once | ORAL | Status: AC
  Administered 2024-02-21: 10 g via ORAL
  Filled 2024-02-21: qty 1

## 2024-02-21 MED ORDER — NEPRO/CARBSTEADY PO LIQD
237.0000 mL | Freq: Two times a day (BID) | ORAL | Status: DC
  Administered 2024-02-21 – 2024-02-22 (×3): 237 mL via ORAL

## 2024-02-21 MED ORDER — FUROSEMIDE 10 MG/ML IJ SOLN
40.0000 mg | Freq: Once | INTRAMUSCULAR | Status: AC
  Administered 2024-02-21: 40 mg via INTRAVENOUS
  Filled 2024-02-21: qty 4

## 2024-02-21 NOTE — Progress Notes (Signed)
 PROGRESS NOTE    Deborah Dawson  FMW:[id number] DOB: [birth year] DOA: 02/09/2024 PCP: Sadie Manna, MD  Chief Complaint  Patient presents with   Shortness of Breath    Hospital Course:  Deborah Dawson 73 year old female with extensive past medical history including paroxysmal A-fib, prior pulmonary embolism, chronic heart failure with reduced EF LVEF 25%, recurrent right-sided pleural effusions, permanent pacemaker placement, hypertension, diabetes, who presented to the ED for evaluation of shortness of breath.  She was admitted earlier this month for acute on chronic systolic CHF with right-sided pleural effusion for which she underwent thoracentesis on 11/4, 900 cc removed.  She underwent additional thoracentesis 11/7 additional 700 cc removed.  At that time she was found have a small pulmonary embolism and she was switched from Eliquis  to Xarelto . This admission while patient was admitting room placement in the ED she became suddenly unresponsive and was found to be in PEA.  She underwent 1 round of CPR and ACLS prior to ROSC.  She then became obtunded and was emergently intubated for airway protection.  About 20 minutes after this event she went into PEA again and received another round of CPR and then achieved ROSC.  She was then transferred to the ICU.  She was extubated 11/20 and transferred back to [facility] on 11/21. Stay has been further complicated by severe AKI and metabolic acidosis.  Patient was started on hemodialysis on 11/23.  Patient underwent attempted permacath placement on 11/25.  During permacath placement she received Versed  and fentanyl  and subsequently underwent respiratory arrest.  She required Narcan  before returning to spontaneous respirations and then was transferred to the ICU. Patient became increasingly dyspneic on 11/27 and CXR revealed worsening right-sided pleural effusion.  She underwent thoracentesis 1.2 L fluid removed. Patient tolerated dialysis well on 11/28.  She  continues to have significant improvement.  She still pending permacath placement  Subjective: No acute events overnight.  We discussed her hyperkalemia and declining kidney function as an indication that she may require permanent dialysis.  She endorses understanding. She does endorse some shortness of breath this morning which improved with breathing treatment  Objective: Vitals:   02/20/24 2023 02/21/24 0452 02/21/24 0454 02/21/24 0816  BP: 119/80  (!) 141/83 (!) 138/90  Pulse: 79  (!) 49 80  Resp: 18  18 18   Temp: 97.8 F (36.6 C)   (!) 97.3 F (36.3 C)  TempSrc: Oral   Oral  SpO2: 93%  96% 93%  Weight:  73.9 kg    Height:        Intake/Output Summary (Last 24 hours) at 02/21/2024 1522 Last data filed at 02/21/2024 1437 Gross per 24 hour  Intake 440 ml  Output --  Net 440 ml   Filed Weights   02/19/24 1304 02/20/24 0506 02/21/24 0452  Weight: 64.2 kg 73 kg 73.9 kg    Examination: General exam: Acute distress, nontoxic-appearing, appears more energetic than prior evaluations Respiratory system: No work of breathing, breathing easily, clear to auscultation bilaterally cardiovascular system: S1 & S2 heard, RRR.  Gastrointestinal system: Abdomen is nondistended, soft and nontender.  Neuro: Alert and oriented. No focal neurological deficits  Assessment & Plan:  Principal Problem:   CAP (community acquired pneumonia) Active Problems:   Acute renal failure   Encounter for dialysis and dialysis catheter care   Cardiac arrest (HCC)   Pressure injury of skin     PEA arrest x2 -Patient has extensive cardiac history including significantly reduced EF and PPM ICD  in place. - Continue monitoring on telemetry ICU - Cardiology consulted, saw patient once on 11/26.  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  No plans for inpatient ischemic workup at this time.  Appreciate any follow-up care. - Continue monitoring on telemetry  Respiratory arrest - Occurred  after receiving 2 mg Versed  and 50 mcg of fentanyl  as conscious sedation appropriation for permacath.  Patient was given Narcan  and bagged.  Return to spontaneous respiration. - Continue monitoring respiratory status closely - Currently on nasal cannula and O2 sats are 99%  Acute hypoxic respiratory failure  Pulm edema Moderate to large pleural effusion  -CT on arrival showed diffuse bronchial wall thickening with mucous plugging, later showed rapid development of pulmonary edema and pleural effusions - 11/27 1.2 L thoracentesis off the right lung.  This is third thoracentesis this month. - Has been on IV Lasix  but patient has had minimal urinary output.  Discontinued IV Lasix  11/29 - Volume management will be through hemodialysis moving forward - Continue to support respirations with saline nebs twice daily and DuoNebs  Parainfluenza virus 3 infection Continue supportive care with nebulizers, Tessalon  - Flu/COVID/RSV negative multiple times this month   Possible PNA -Status post 7 days Unasyn  - CXR 11/27 with worsening pleural effusion and atelectasis.  Could be obscuring pneumonia.  No fever presently.  No WBC elevation.  Acute on chronic Heart failure with reduced EF 20 to 25%  PPM-ICD -Hemodialysis now for volume removal, tolerating dialysis better now. - Next dialysis session on Tuesday -EF this admission 30 to 35%, global hypokinesis   Oliguric AKI superimposed on CKD stage IIIb, has now progressed to ESRD quiring renal replacement therapy Metabolic acidosis -May be secondary to ATN from hypotension as well as IV contrast exposure, unclear if she will make significant renal recovery - Patient is increasingly hyperkalemic today and is continuing to make very minimal amounts of urine.  Doubt significant renal recovery - Nephrology is following, plan for next dialysis on Tuesday - Continue with Lokelma  as needed - Status post bicarb drip - Started on hemodialysis 11/23 -  Attempted place permacath 11/25 but procedure was aborted due to respiratory arrest. she will still need this prior to discharge, likely early next week perhaps tomorrow - Strict I's and O's, patient endorses urination though minimal output is recorded.  Have asked RN to record  Electrolyte derangements Hypocalcemia - Status post replacement.  Improved now.  Continue to monitor daily CMP  Hypokalemia - Replace as needed - Follow-up CMP  Mild hyperkalemia -Continue following.  Dialysis and shifters as needed   History of PAF  - Continue carvedilol  --switched from Xarelto  to Eliquis  due to kidney function  Hx of PE --switched from Xarelto  to Eliquis  due to kidney function --cont Eliquis    DM2 Hypoglycemia --hypoglycemia due to poor oral intake -- Sliding scale very sensitive.  Discontinue basal insulin   Severely deconditioned Poor prognosis - Patient is starting to make recovery now.  Suspect she will be very deconditioned and would benefit from SNF stay.  Proceed with PT/OT.  Initially dietitian was recommending Dobbhoff tube for nutrition but she is now maintaining her diet p.o.   -Palliative care for ongoing GOC conversations.  Presently patient desires full code and aggressive measures   Acute 7th rib fracture --Acute minimally displaced fracture of the left anterolateral seventh rib.  --from CPR during codes --pt doesn't want to take opioid pain meds. --tylenol  PRN  Body mass index is 30.8 kg/m. - Outpatient follow up for  lifestyle modification and risk factor management   DVT prophylaxis: Eliquis    Code Status: Full Code Disposition: Continue to monitor on telemetry.  Pending significant clinical improvement. Permacath tomorrow  Consultants:    Procedures:  attempted permacath 11/25  Antimicrobials:  Anti-infectives (From admission, onward)    Start     Dose/Rate Route Frequency Ordered Stop   02/16/24 0915  ceFAZolin  (ANCEF ) IVPB 1 g/50 mL premix  Status:   Discontinued        1 g 100 mL/hr over 30 Minutes Intravenous 30 min pre-op 02/16/24 0916 02/16/24 1232   02/11/24 1200  Ampicillin -Sulbactam (UNASYN ) 3 g in sodium chloride  0.9 % 100 mL IVPB  Status:  Discontinued        3 g 200 mL/hr over 30 Minutes Intravenous Every 12 hours 02/11/24 0749 02/16/24 1922   02/10/24 2230  azithromycin  (ZITHROMAX ) 500 mg in sodium chloride  0.9 % 250 mL IVPB  Status:  Discontinued        500 mg 250 mL/hr over 60 Minutes Intravenous Every 24 hours 02/10/24 0146 02/10/24 1016   02/10/24 0245  Ampicillin -Sulbactam (UNASYN ) 3 g in sodium chloride  0.9 % 100 mL IVPB  Status:  Discontinued        3 g 200 mL/hr over 30 Minutes Intravenous Every 6 hours 02/10/24 0158 02/11/24 0749   02/09/24 2115  cefTRIAXone  (ROCEPHIN ) 1 g in sodium chloride  0.9 % 100 mL IVPB        1 g 200 mL/hr over 30 Minutes Intravenous  Once 02/09/24 2108 02/09/24 2231   02/09/24 2115  azithromycin  (ZITHROMAX ) 500 mg in sodium chloride  0.9 % 250 mL IVPB        500 mg 250 mL/hr over 60 Minutes Intravenous  Once 02/09/24 2108 02/09/24 2336       Data Reviewed: I have personally reviewed following labs and imaging studies CBC: Recent Labs  Lab 02/17/24 1359 02/19/24 0500 02/20/24 0354 02/21/24 0504  WBC 7.7 7.3 8.2 8.6  NEUTROABS 5.6 3.9 5.1 5.7  HGB 12.7 13.3 13.1 13.4  HCT 38.7 40.7 40.5 40.8  MCV 87.4 88.3 88.8 88.1  PLT 178 199 198 195   Basic Metabolic Panel: Recent Labs  Lab 02/17/24 0414 02/18/24 0542 02/19/24 0500 02/19/24 0940 02/20/24 0354 02/21/24 0504  NA 131* 138 131*  --  131* 129*  K 4.0 2.8* 6.1* 4.2 4.8 5.5*  CL 95* 109 96*  --  95* 92*  CO2 23 18* 25  --  27 27  GLUCOSE 128* 81 128*  --  148* 164*  BUN 44* 30* 43*  --  28* 33*  CREATININE 2.48* 1.56* 2.37*  --  1.66* 1.82*  CALCIUM  8.5* 6.0* 9.3  --  8.8* 9.0  MG 2.6* 1.7 2.4  --  2.0 2.1  PHOS  --  3.0 4.4  --  3.4 4.2   GFR: Estimated Creatinine Clearance: 24.5 mL/min (A) (by C-G formula based on  SCr of 1.82 mg/dL (H)). Liver Function Tests: Recent Labs  Lab 02/18/24 0542 02/19/24 0500 02/20/24 0354 02/21/24 0504  AST  --  76* 54* 39  ALT  --  97* 75* 64*  ALKPHOS  --  81 79 77  BILITOT  --  0.8 0.7 0.8  PROT  --  6.9 6.8 6.7  ALBUMIN 2.2* 3.4* 3.2* 3.4*   CBG: Recent Labs  Lab 02/20/24 1208 02/20/24 1715 02/20/24 2251 02/21/24 0819 02/21/24 1135  GLUCAP 213* 171* 154* 144* 183*    Recent  Results (from the past 240 hours)  Resp panel by RT-PCR (RSV, Flu A&B, Covid) Anterior Nasal Swab     Status: None   Collection Time: 02/17/24  6:19 PM   Specimen: Anterior Nasal Swab  Result Value Ref Range Status   SARS Coronavirus 2 by RT PCR NEGATIVE NEGATIVE Final    Comment: (NOTE) SARS-CoV-2 target nucleic acids are NOT DETECTED.  The SARS-CoV-2 RNA is generally detectable in upper respiratory specimens during the acute phase of infection. The lowest concentration of SARS-CoV-2 viral copies this assay can detect is 138 copies/mL. A negative result does not preclude SARS-Cov-2 infection and should not be used as the sole basis for treatment or other patient management decisions. A negative result may occur with  improper specimen collection/handling, submission of specimen other than nasopharyngeal swab, presence of viral mutation(s) within the areas targeted by this assay, and inadequate number of viral copies(<138 copies/mL). A negative result must be combined with clinical observations, patient history, and epidemiological information. The expected result is Negative.  Fact Sheet for Patients:  bloggercourse.com  Fact Sheet for Healthcare Providers:  seriousbroker.it  This test is no t yet approved or cleared by the [place]  FDA and  has been authorized for detection and/or diagnosis of SARS-CoV-2 by FDA under an Emergency Use Authorization (EUA). This EUA will remain  in effect (meaning this test can be  used) for the duration of the COVID-19 declaration under Section 564(b)(1) of the Act, 21 U.S.C.section 360bbb-3(b)(1), unless the authorization is terminated  or revoked sooner.       Influenza A by PCR NEGATIVE NEGATIVE Final   Influenza B by PCR NEGATIVE NEGATIVE Final    Comment: (NOTE) The Xpert Xpress SARS-CoV-2/FLU/RSV plus assay is intended as an aid in the diagnosis of influenza from Nasopharyngeal swab specimens and should not be used as a sole basis for treatment. Nasal washings and aspirates are unacceptable for Xpert Xpress SARS-CoV-2/FLU/RSV testing.  Fact Sheet for Patients: bloggercourse.com  Fact Sheet for Healthcare Providers: seriousbroker.it  This test is not yet approved or cleared by the [place]  FDA and has been authorized for detection and/or diagnosis of SARS-CoV-2 by FDA under an Emergency Use Authorization (EUA). This EUA will remain in effect (meaning this test can be used) for the duration of the COVID-19 declaration under Section 564(b)(1) of the Act, 21 U.S.C. section 360bbb-3(b)(1), unless the authorization is terminated or revoked.     Resp Syncytial Virus by PCR NEGATIVE NEGATIVE Final    Comment: (NOTE) Fact Sheet for Patients: bloggercourse.com  Fact Sheet for Healthcare Providers: seriousbroker.it  This test is not yet approved or cleared by the [place]  FDA and has been authorized for detection and/or diagnosis of SARS-CoV-2 by FDA under an Emergency Use Authorization (EUA). This EUA will remain in effect (meaning this test can be used) for the duration of the COVID-19 declaration under Section 564(b)(1) of the Act, 21 U.S.C. section 360bbb-3(b)(1), unless the authorization is terminated or revoked.  Performed at [facility], [address], [place], [place] [address]      Radiology Studies: No results  found.   Scheduled Meds:  sodium chloride    Intravenous Once   albuterol   2.5 mg Nebulization Once   apixaban   5 mg Oral BID   vitamin C   500 mg Oral BID   carvedilol   6.25 mg Oral BID WC   Chlorhexidine  Gluconate Cloth  6 each Topical Daily   feeding supplement (NEPRO CARB STEADY)  237 mL  Oral BID BM   gabapentin   100 mg Oral BID   insulin  aspart  0-6 Units Subcutaneous TID WC   lidocaine   1 patch Transdermal Q24H   multivitamin  1 tablet Oral QHS   multivitamin with minerals  1 tablet Oral Daily   sodium chloride  flush  3 mL Intravenous Q12H   Continuous Infusions:   LOS: 12 days  CRITICAL CARETotal critical care time: 65 minutes Critical care time was exclusive of separately billable procedures and treating other patients. Critical care was necessary to treat or prevent imminent or life-threatening deterioration. Critical care was time spent personally by me on the following activities: development of treatment plan with patient and/or surrogate as well as nursing, discussions with consultants, evaluation of patient's response to treatment, examination of patient, obtaining history from patient or surrogate, ordering and performing treatments and interventions, ordering and review of laboratory studies, ordering and review of radiographic studies, pulse oximetry and re-evaluation of patient's condition.   Adiva Boettner, DO [facility]  To contact the attending physician between 7A-7P please use Epic Chat. To contact the covering physician during after hours 7P-7A, please review Amion.  02/21/2024, 3:22 PM   *This document has been created with the assistance of dictation software. Please excuse typographical errors. *

## 2024-02-21 NOTE — Progress Notes (Addendum)
 [facility], [place] 02/21/24  Subjective:   Hospital day # 12  Patient sitting at bedside States she feel well today Room air No complaints   Objective:  Vital signs in last 24 hours:  Temp:  [97.3 F (36.3 C)-97.8 F (36.6 C)] 97.3 F (36.3 C) (11/30 0816) Pulse Rate:  [49-86] 80 (11/30 0816) Resp:  [18] 18 (11/30 0816) BP: (119-141)/(80-90) 138/90 (11/30 0816) SpO2:  [93 %-98 %] 93 % (11/30 0816) Weight:  [73.9 kg] 73.9 kg (11/30 0452)  Weight change: 6.836 kg Filed Weights   02/19/24 1304 02/20/24 0506 02/21/24 0452  Weight: 64.2 kg 73 kg 73.9 kg    Intake/Output:    Intake/Output Summary (Last 24 hours) at 02/21/2024 0932 Last data filed at 02/20/2024 1300 Gross per 24 hour  Intake 320 ml  Output --  Net 320 ml     Physical Exam: General: NAD  HEENT Moist oral mucous membranes  Pulm/lungs decreased breath sounds   CVS/Heart paced  Abdomen:  Soft, nontender, nondistended  Extremities: Trace edema  Neurologic: Alert, able to answer questions appropriately  Skin: Warm, dry  Access: Rt femoral HD temp cath       Basic Metabolic Panel:  Recent Labs  Lab 02/17/24 0414 02/18/24 0542 02/19/24 0500 02/19/24 0940 02/20/24 0354 02/21/24 0504  NA 131* 138 131*  --  131* 129*  K 4.0 2.8* 6.1* 4.2 4.8 5.5*  CL 95* 109 96*  --  95* 92*  CO2 23 18* 25  --  27 27  GLUCOSE 128* 81 128*  --  148* 164*  BUN 44* 30* 43*  --  28* 33*  CREATININE 2.48* 1.56* 2.37*  --  1.66* 1.82*  CALCIUM  8.5* 6.0* 9.3  --  8.8* 9.0  MG 2.6* 1.7 2.4  --  2.0 2.1  PHOS  --  3.0 4.4  --  3.4 4.2     CBC: Recent Labs  Lab 02/14/24 1415 02/17/24 1359 02/19/24 0500 02/20/24 0354 02/21/24 0504  WBC 7.1 7.7 7.3 8.2 8.6  NEUTROABS  --  5.6 3.9 5.1 5.7  HGB 12.5 12.7 13.3 13.1 13.4  HCT 37.4 38.7 40.7 40.5 40.8  MCV 85.2 87.4 88.3 88.8 88.1  PLT 163 178 199 198 195      Lab Results  Component Value Date   HEPBSAG NON REACTIVE  02/14/2024      Microbiology:  Recent Results (from the past 240 hours)  Body fluid culture w Gram Stain     Status: None   Collection Time: 02/11/24 12:25 PM   Specimen: Pleura; Body Fluid  Result Value Ref Range Status   Specimen Description   Final    PLEURAL Performed at [facility], [address] [place], [place] [address]    Special Requests   Final    NONE Performed at [facility], [address] [place], [place] [address]    Gram Stain   Final    WBC PRESENT, PREDOMINANTLY MONONUCLEAR NO ORGANISMS SEEN    Culture   Final    NO GROWTH 3 DAYS Performed at [facility], [address], [place], [place] [address]    Report Status 02/15/2024 FINAL  Final  Resp panel by RT-PCR (RSV, Flu A&B, Covid) Anterior Nasal Swab     Status: None   Collection Time: 02/17/24  6:19 PM   Specimen: Anterior Nasal Swab  Result Value Ref Range Status   SARS Coronavirus 2 by RT  PCR NEGATIVE NEGATIVE Final    Comment: (NOTE) SARS-CoV-2 target nucleic acids are NOT DETECTED.  The SARS-CoV-2 RNA is generally detectable in upper respiratory specimens during the acute phase of infection. The lowest concentration of SARS-CoV-2 viral copies this assay can detect is 138 copies/mL. A negative result does not preclude SARS-Cov-2 infection and should not be used as the sole basis for treatment or other patient management decisions. A negative result may occur with  improper specimen collection/handling, submission of specimen other than nasopharyngeal swab, presence of viral mutation(s) within the areas targeted by this assay, and inadequate number of viral copies(<138 copies/mL). A negative result must be combined with clinical observations, patient history, and epidemiological information. The expected result is Negative.  Fact Sheet for Patients:  bloggercourse.com  Fact Sheet for Healthcare Providers:   seriousbroker.it  This test is no t yet approved or cleared by the [place]  FDA and  has been authorized for detection and/or diagnosis of SARS-CoV-2 by FDA under an Emergency Use Authorization (EUA). This EUA will remain  in effect (meaning this test can be used) for the duration of the COVID-19 declaration under Section 564(b)(1) of the Act, 21 U.S.C.section 360bbb-3(b)(1), unless the authorization is terminated  or revoked sooner.       Influenza A by PCR NEGATIVE NEGATIVE Final   Influenza B by PCR NEGATIVE NEGATIVE Final    Comment: (NOTE) The Xpert Xpress SARS-CoV-2/FLU/RSV plus assay is intended as an aid in the diagnosis of influenza from Nasopharyngeal swab specimens and should not be used as a sole basis for treatment. Nasal washings and aspirates are unacceptable for Xpert Xpress SARS-CoV-2/FLU/RSV testing.  Fact Sheet for Patients: bloggercourse.com  Fact Sheet for Healthcare Providers: seriousbroker.it  This test is not yet approved or cleared by the [place]  FDA and has been authorized for detection and/or diagnosis of SARS-CoV-2 by FDA under an Emergency Use Authorization (EUA). This EUA will remain in effect (meaning this test can be used) for the duration of the COVID-19 declaration under Section 564(b)(1) of the Act, 21 U.S.C. section 360bbb-3(b)(1), unless the authorization is terminated or revoked.     Resp Syncytial Virus by PCR NEGATIVE NEGATIVE Final    Comment: (NOTE) Fact Sheet for Patients: bloggercourse.com  Fact Sheet for Healthcare Providers: seriousbroker.it  This test is not yet approved or cleared by the [place]  FDA and has been authorized for detection and/or diagnosis of SARS-CoV-2 by FDA under an Emergency Use Authorization (EUA). This EUA will remain in effect (meaning this test can be used) for  the duration of the COVID-19 declaration under Section 564(b)(1) of the Act, 21 U.S.C. section 360bbb-3(b)(1), unless the authorization is terminated or revoked.  Performed at [facility], [address] [place], [place] [address]     Coagulation Studies: No results for input(s): LABPROT, INR in the last 72 hours.  Urinalysis: No results for input(s): COLORURINE, LABSPEC, PHURINE, GLUCOSEU, HGBUR, BILIRUBINUR, KETONESUR, PROTEINUR, UROBILINOGEN, NITRITE, LEUKOCYTESUR in the last 72 hours.  Invalid input(s): APPERANCEUR    Imaging: No results found.     Medications:      sodium chloride    Intravenous Once   albuterol   2.5 mg Nebulization Once   apixaban   5 mg Oral BID   vitamin C   500 mg Oral BID   carvedilol   6.25 mg Oral BID WC   Chlorhexidine  Gluconate Cloth  6 each Topical Daily   feeding supplement (NEPRO CARB STEADY)  237 mL Oral BID BM   gabapentin   100 mg Oral BID   insulin  aspart  0-6 Units Subcutaneous TID WC   lidocaine   1 patch Transdermal Q24H   multivitamin  1 tablet Oral QHS   multivitamin with minerals  1 tablet Oral Daily   sodium chloride  flush  3 mL Intravenous Q12H   sodium zirconium cyclosilicate   10 g Oral Once   acetaminophen , alum & mag hydroxide-simeth, benzonatate , hydrALAZINE , HYDROcodone  bit-homatropine, hydrocortisone  cream, ipratropium-albuterol , menthol , mouth rinse, mouth rinse, oxyCODONE , polyethylene glycol, prochlorperazine , promethazine , sodium chloride   Assessment/ Plan:  73 y.o. female with  medical problems of   PAF, PE on Xarelto , chronic HFrEF LVEF 25% with recurrent right-sided pleural effusion, PPM-ICD, HTN, IDDM, gout   admitted on 02/09/2024 for Cardiac arrest (HCC) [I46.9] CAP (community acquired pneumonia) [J18.9] Dyspnea, unspecified type [R06.00] Community acquired pneumonia, unspecified laterality [J18.9]  Complicated by PEA cardiac arrest, pulmonary embolism, large right  pleural effusion, parainfluenza virus 3   AKI volume overload Acute kidney injury, oliguric, likely due to to ATN secondary to hypotension, IV contrast exposure, possible pneumonia. Pneumonia treated with Unasyn  Renal US  negative for hydronephrosis. Developed respiratory arrest after fentanyl  and Versed  for PermCath placement  Plan: Next treatment scheduled for Tuesday. Continue to await accurate urine output totals. Will request permcath placement on Monday Will order IV furosemide  40mg  once for shortness of breath   Hyperkalemia Potassium 5.5 today. Will order Lokelma  10g once. Also stopped Ensure and ordered Nepro twice daily.    Acute metabolic acidosis Likely secondary to renal insufficiency. Serum bicarbonate corrected. Will continue management with dialysis    Hyponatremia Likely secondary to AKI. S sodium 129   Chronic kidney disease, stage IIIb.  Baseline creatinine 1.4/GFR 38 from 02/10/2024.  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 Receiving hemodialysis and monitoring for renal recovery   Comorbidities Chronic systolic CHF-2D echo from 02/10/2024 show LVEF 30 to 35%, global hypokinesis, moderately reduced right ventricular systolic function   Acute pulmonary embolism-patient is currently on apixaban  5 mg twice a day   Right pleural effusion- Pulmonary and cardiac teams are following. Thoracentesis this admission       LOS: 12 [facility] 11/30/20259:32 AM  [facility] [place], [place] [phone]

## 2024-02-22 ENCOUNTER — Inpatient Hospital Stay

## 2024-02-22 ENCOUNTER — Encounter: Payer: Self-pay | Admitting: Pulmonary Disease

## 2024-02-22 DIAGNOSIS — J189 Pneumonia, unspecified organism: Secondary | ICD-10-CM | POA: Diagnosis not present

## 2024-02-22 DIAGNOSIS — Z4901 Encounter for fitting and adjustment of extracorporeal dialysis catheter: Secondary | ICD-10-CM | POA: Diagnosis not present

## 2024-02-22 DIAGNOSIS — I469 Cardiac arrest, cause unspecified: Secondary | ICD-10-CM | POA: Diagnosis not present

## 2024-02-22 DIAGNOSIS — N179 Acute kidney failure, unspecified: Secondary | ICD-10-CM | POA: Diagnosis not present

## 2024-02-22 LAB — CBC WITH DIFFERENTIAL/PLATELET
Abs Immature Granulocytes: 0.09 K/uL — ABNORMAL HIGH (ref 0.00–0.07)
Basophils Absolute: 0 K/uL (ref 0.0–0.1)
Basophils Relative: 0 %
Eosinophils Absolute: 0.3 K/uL (ref 0.0–0.5)
Eosinophils Relative: 3 %
HCT: 40.4 % (ref 36.0–46.0)
Hemoglobin: 13.2 g/dL (ref 12.0–15.0)
Immature Granulocytes: 1 %
Lymphocytes Relative: 18 %
Lymphs Abs: 1.5 K/uL (ref 0.7–4.0)
MCH: 28.9 pg (ref 26.0–34.0)
MCHC: 32.7 g/dL (ref 30.0–36.0)
MCV: 88.6 fL (ref 80.0–100.0)
Monocytes Absolute: 0.8 K/uL (ref 0.1–1.0)
Monocytes Relative: 10 %
Neutro Abs: 5.6 K/uL (ref 1.7–7.7)
Neutrophils Relative %: 68 %
Platelets: 180 K/uL (ref 150–400)
RBC: 4.56 MIL/uL (ref 3.87–5.11)
RDW: 17.7 % — ABNORMAL HIGH (ref 11.5–15.5)
WBC: 8.3 K/uL (ref 4.0–10.5)
nRBC: 2 % — ABNORMAL HIGH (ref 0.0–0.2)

## 2024-02-22 LAB — COMPREHENSIVE METABOLIC PANEL WITH GFR
ALT: 51 U/L — ABNORMAL HIGH (ref 0–44)
AST: 40 U/L (ref 15–41)
Albumin: 3.2 g/dL — ABNORMAL LOW (ref 3.5–5.0)
Alkaline Phosphatase: 73 U/L (ref 38–126)
Anion gap: 10 (ref 5–15)
BUN: 40 mg/dL — ABNORMAL HIGH (ref 8–23)
CO2: 26 mmol/L (ref 22–32)
Calcium: 8.8 mg/dL — ABNORMAL LOW (ref 8.9–10.3)
Chloride: 89 mmol/L — ABNORMAL LOW (ref 98–111)
Creatinine, Ser: 2.05 mg/dL — ABNORMAL HIGH (ref 0.44–1.00)
GFR, Estimated: 25 mL/min — ABNORMAL LOW (ref >60)
Glucose, Bld: 176 mg/dL — ABNORMAL HIGH (ref 70–99)
Potassium: 5.4 mmol/L — ABNORMAL HIGH (ref 3.5–5.1)
Sodium: 125 mmol/L — ABNORMAL LOW (ref 135–145)
Total Bilirubin: 0.7 mg/dL (ref 0.0–1.2)
Total Protein: 6.4 g/dL — ABNORMAL LOW (ref 6.5–8.1)

## 2024-02-22 LAB — GLUCOSE, CAPILLARY
Glucose-Capillary: 170 mg/dL — ABNORMAL HIGH (ref 70–99)
Glucose-Capillary: 258 mg/dL — ABNORMAL HIGH (ref 70–99)
Glucose-Capillary: 208 mg/dL — ABNORMAL HIGH (ref 70–99)
Glucose-Capillary: 178 mg/dL — ABNORMAL HIGH (ref 70–99)

## 2024-02-22 LAB — MAGNESIUM: Magnesium: 2.4 mg/dL (ref 1.7–2.4)

## 2024-02-22 LAB — PHOSPHORUS: Phosphorus: 4.8 mg/dL — ABNORMAL HIGH (ref 2.5–4.6)

## 2024-02-22 MED ORDER — METOCLOPRAMIDE HCL 5 MG/ML IJ SOLN
5.0000 mg | Freq: Three times a day (TID) | INTRAMUSCULAR | Status: DC | PRN
  Administered 2024-02-22: 5 mg via INTRAVENOUS
  Filled 2024-02-22: qty 2

## 2024-02-22 MED ORDER — PANTOPRAZOLE SODIUM 40 MG PO TBEC
40.0000 mg | DELAYED_RELEASE_TABLET | Freq: Every day | ORAL | Status: DC
  Administered 2024-02-22: 40 mg via ORAL
  Filled 2024-02-22: qty 1

## 2024-02-22 MED ORDER — HEPARIN SODIUM (PORCINE) 1000 UNIT/ML DIALYSIS
1000.0000 [IU] | INTRAMUSCULAR | Status: DC | PRN
  Administered 2024-02-22: 1000 [IU]

## 2024-02-22 MED ORDER — FUROSEMIDE 10 MG/ML IJ SOLN
80.0000 mg | Freq: Two times a day (BID) | INTRAMUSCULAR | Status: DC
  Administered 2024-02-22 – 2024-02-24 (×4): 80 mg via INTRAVENOUS
  Filled 2024-02-22 (×4): qty 8

## 2024-02-22 MED ORDER — HEPARIN SODIUM (PORCINE) 1000 UNIT/ML IJ SOLN
INTRAMUSCULAR | Status: AC
  Filled 2024-02-22: qty 3

## 2024-02-22 MED ORDER — ORAL CARE MOUTH RINSE
15.0000 mL | OROMUCOSAL | Status: DC
  Administered 2024-02-22 – 2024-02-24 (×7): 15 mL via OROMUCOSAL

## 2024-02-22 MED ORDER — ALTEPLASE 2 MG IJ SOLR: 2.0000 mg | Freq: Once | INTRAMUSCULAR | Status: DC | PRN

## 2024-02-22 MED ORDER — CHLORHEXIDINE GLUCONATE CLOTH 2 % EX PADS
6.0000 | MEDICATED_PAD | Freq: Every day | CUTANEOUS | Status: DC
  Administered 2024-02-23 – 2024-02-24 (×2): 6 via TOPICAL

## 2024-02-22 MED ORDER — SODIUM ZIRCONIUM CYCLOSILICATE 10 G PO PACK
10.0000 g | PACK | Freq: Once | ORAL | Status: AC
  Administered 2024-02-22: 10 g via ORAL
  Filled 2024-02-22: qty 1

## 2024-02-22 MED ORDER — HEPARIN SODIUM (PORCINE) 1000 UNIT/ML DIALYSIS: 1000.0000 [IU] | INTRAMUSCULAR | Status: DC | PRN

## 2024-02-22 MED ORDER — ORAL CARE MOUTH RINSE: 15.0000 mL | OROMUCOSAL | Status: DC | PRN

## 2024-02-22 NOTE — Progress Notes (Signed)
 [facility], [place] 02/22/24  Subjective:   Hospital day # 13  Patient sitting at bedside, eating breakfast Was unable to eat dinner due to  nausea Remains on 1L, denies shortness of breath    Objective:  Vital signs in last 24 hours:  Temp:  [97.6 F (36.4 C)-97.7 F (36.5 C)] 97.6 F (36.4 C) (12/01 0745) Pulse Rate:  [76-87] 87 (12/01 0745) Resp:  [17-20] 17 (12/01 0745) BP: (120-154)/(81-88) 154/88 (12/01 0745) SpO2:  [90 %-100 %] 100 % (12/01 0745) Weight:  [77.1 kg] 77.1 kg (12/01 0340)  Weight change: 3.175 kg Filed Weights   02/20/24 0506 02/21/24 0452 02/22/24 0340  Weight: 73 kg 73.9 kg 77.1 kg    Intake/Output:    Intake/Output Summary (Last 24 hours) at 02/22/2024 1015 Last data filed at 02/22/2024 0100 Gross per 24 hour  Intake 410 ml  Output --  Net 410 ml     Physical Exam: General: NAD  HEENT Moist oral mucous membranes  Pulm/lungs decreased breath sounds, [place] O2  CVS/Heart paced  Abdomen:  Soft, nontender, nondistended  Extremities: Trace edema  Neurologic: Alert, able to answer questions appropriately  Skin: Warm, dry  Access: Rt femoral HD temp cath       Basic Metabolic Panel:  Recent Labs  Lab 02/18/24 0542 02/19/24 0500 02/19/24 0940 02/20/24 0354 02/21/24 0504 02/22/24 0215  NA 138 131*  --  131* 129* 125*  K 2.8* 6.1* 4.2 4.8 5.5* 5.4*  CL 109 96*  --  95* 92* 89*  CO2 18* 25  --  27 27 26   GLUCOSE 81 128*  --  148* 164* 176*  BUN 30* 43*  --  28* 33* 40*  CREATININE 1.56* 2.37*  --  1.66* 1.82* 2.05*  CALCIUM  6.0* 9.3  --  8.8* 9.0 8.8*  MG 1.7 2.4  --  2.0 2.1 2.4  PHOS 3.0 4.4  --  3.4 4.2 4.8*     CBC: Recent Labs  Lab 02/17/24 1359 02/19/24 0500 02/20/24 0354 02/21/24 0504 02/22/24 0215  WBC 7.7 7.3 8.2 8.6 8.3  NEUTROABS 5.6 3.9 5.1 5.7 5.6  HGB 12.7 13.3 13.1 13.4 13.2  HCT 38.7 40.7 40.5 40.8 40.4  MCV 87.4 88.3 88.8 88.1 88.6  PLT 178 199 198 195 180      Lab  Results  Component Value Date   HEPBSAG NON REACTIVE 02/14/2024      Microbiology:  Recent Results (from the past 240 hours)  Resp panel by RT-PCR (RSV, Flu A&B, Covid) Anterior Nasal Swab     Status: None   Collection Time: 02/17/24  6:19 PM   Specimen: Anterior Nasal Swab  Result Value Ref Range Status   SARS Coronavirus 2 by RT PCR NEGATIVE NEGATIVE Final    Comment: (NOTE) SARS-CoV-2 target nucleic acids are NOT DETECTED.  The SARS-CoV-2 RNA is generally detectable in upper respiratory specimens during the acute phase of infection. The lowest concentration of SARS-CoV-2 viral copies this assay can detect is 138 copies/mL. A negative result does not preclude SARS-Cov-2 infection and should not be used as the sole basis for treatment or other patient management decisions. A negative result may occur with  improper specimen collection/handling, submission of specimen other than nasopharyngeal swab, presence of viral mutation(s) within the areas targeted by this assay, and inadequate number of viral copies(<138 copies/mL). A negative result must be combined with clinical observations, patient history, and epidemiological information. The expected result is Negative.  Fact Sheet for Patients:  bloggercourse.com  Fact Sheet for Healthcare Providers:  seriousbroker.it  This test is no t yet approved or cleared by the [place]  FDA and  has been authorized for detection and/or diagnosis of SARS-CoV-2 by FDA under an Emergency Use Authorization (EUA). This EUA will remain  in effect (meaning this test can be used) for the duration of the COVID-19 declaration under Section 564(b)(1) of the Act, 21 U.S.C.section 360bbb-3(b)(1), unless the authorization is terminated  or revoked sooner.       Influenza A by PCR NEGATIVE NEGATIVE Final   Influenza B by PCR NEGATIVE NEGATIVE Final    Comment: (NOTE) The Xpert Xpress  SARS-CoV-2/FLU/RSV plus assay is intended as an aid in the diagnosis of influenza from Nasopharyngeal swab specimens and should not be used as a sole basis for treatment. Nasal washings and aspirates are unacceptable for Xpert Xpress SARS-CoV-2/FLU/RSV testing.  Fact Sheet for Patients: bloggercourse.com  Fact Sheet for Healthcare Providers: seriousbroker.it  This test is not yet approved or cleared by the [place]  FDA and has been authorized for detection and/or diagnosis of SARS-CoV-2 by FDA under an Emergency Use Authorization (EUA). This EUA will remain in effect (meaning this test can be used) for the duration of the COVID-19 declaration under Section 564(b)(1) of the Act, 21 U.S.C. section 360bbb-3(b)(1), unless the authorization is terminated or revoked.     Resp Syncytial Virus by PCR NEGATIVE NEGATIVE Final    Comment: (NOTE) Fact Sheet for Patients: bloggercourse.com  Fact Sheet for Healthcare Providers: seriousbroker.it  This test is not yet approved or cleared by the [place]  FDA and has been authorized for detection and/or diagnosis of SARS-CoV-2 by FDA under an Emergency Use Authorization (EUA). This EUA will remain in effect (meaning this test can be used) for the duration of the COVID-19 declaration under Section 564(b)(1) of the Act, 21 U.S.C. section 360bbb-3(b)(1), unless the authorization is terminated or revoked.  Performed at [facility], [address] [place], [place] [address]     Coagulation Studies: No results for input(s): LABPROT, INR in the last 72 hours.  Urinalysis: No results for input(s): COLORURINE, LABSPEC, PHURINE, GLUCOSEU, HGBUR, BILIRUBINUR, KETONESUR, PROTEINUR, UROBILINOGEN, NITRITE, LEUKOCYTESUR in the last 72 hours.  Invalid input(s): APPERANCEUR    Imaging: No results  found.     Medications:      sodium chloride    Intravenous Once   albuterol   2.5 mg Nebulization Once   apixaban   5 mg Oral BID   vitamin C   500 mg Oral BID   carvedilol   6.25 mg Oral BID WC   Chlorhexidine  Gluconate Cloth  6 each Topical Daily   feeding supplement (NEPRO CARB STEADY)  237 mL Oral BID BM   gabapentin   100 mg Oral BID   insulin  aspart  0-6 Units Subcutaneous TID WC   lidocaine   1 patch Transdermal Q24H   multivitamin  1 tablet Oral QHS   multivitamin with minerals  1 tablet Oral Daily   sodium chloride  flush  3 mL Intravenous Q12H   acetaminophen , benzonatate , hydrALAZINE , HYDROcodone  bit-homatropine, hydrocortisone  cream, ipratropium-albuterol , menthol , mouth rinse, mouth rinse, oxyCODONE , polyethylene glycol, prochlorperazine , promethazine , sodium chloride   Assessment/ Plan:  73 y.o. female with  medical problems of   PAF, PE on Xarelto , chronic HFrEF LVEF 25% with recurrent right-sided pleural effusion, PPM-ICD, HTN, IDDM, gout   admitted on 02/09/2024 for Cardiac arrest (HCC) [I46.9] CAP (community acquired pneumonia) [J18.9] Dyspnea, unspecified type [R06.00] Community acquired pneumonia,  unspecified laterality [J18.9]  Complicated by PEA cardiac arrest, pulmonary embolism, large right pleural effusion, parainfluenza virus 3   AKI volume overload Acute kidney injury, oliguric, likely due to to ATN secondary to hypotension, IV contrast exposure, possible pneumonia. Pneumonia treated with Unasyn  Renal US  negative for hydronephrosis. Developed respiratory arrest after fentanyl  and Versed  for PermCath placement  Plan: Vascular will re-attempt permcath placement tomorrow. Will perform dialysis today to optimize respiratory status. Outpatient clinic confirmed at [facility] on a TTS schedule, will request start date adjustment to Thursday.    Hyperkalemia Potassium 5.4 today, given Lokelma  yesterday. Will manage with dialysis treatment today.     Acute metabolic acidosis Likely secondary to renal insufficiency. Will continue management with dialysis    Hyponatremia Likely secondary to AKI. S sodium 125   Chronic kidney disease, stage IIIb.  Baseline creatinine 1.4/GFR 38 from 02/10/2024.  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 Receiving hemodialysis and monitoring for renal recovery   Comorbidities Chronic systolic CHF-2D echo from 02/10/2024 show LVEF 30 to 35%, global hypokinesis, moderately reduced right ventricular systolic function   Acute pulmonary embolism-patient is currently on apixaban  5 mg twice a day   Right pleural effusion- Pulmonary and cardiac teams are following. Thoracentesis this admission       LOS: 13 [facility] 12/1/202510:15 AM  [address] [place], [place] [phone]

## 2024-02-22 NOTE — Progress Notes (Signed)
 PROGRESS NOTE    Deborah Dawson  FMW:[id number] DOB: [birth year] DOA: 02/09/2024 PCP: Sadie Manna, MD  Chief Complaint  Patient presents with   Shortness of Breath    Hospital Course:  Deborah Dawson 73 year old female with extensive past medical history including paroxysmal A-fib, prior pulmonary embolism, chronic heart failure with reduced EF LVEF 25%, recurrent right-sided pleural effusions, permanent pacemaker placement, hypertension, diabetes, who presented to the ED for evaluation of shortness of breath.  She was admitted earlier this month for acute on chronic systolic CHF with right-sided pleural effusion for which she underwent thoracentesis on 11/4, 900 cc removed.  She underwent additional thoracentesis 11/7 additional 700 cc removed.  At that time she was found have a small pulmonary embolism and she was switched from Eliquis  to Xarelto . This admission while patient was admitting room placement in the ED she became suddenly unresponsive and was found to be in PEA.  She underwent 1 round of CPR and ACLS prior to ROSC.  She then became obtunded and was emergently intubated for airway protection.  About 20 minutes after this event she went into PEA again and received another round of CPR and then achieved ROSC.  She was then transferred to the ICU.  She was extubated 11/20 and transferred back to [facility] on 11/21. Stay has been further complicated by severe AKI and metabolic acidosis.  Patient was started on hemodialysis on 11/23.  Patient underwent attempted permacath placement on 11/25.  During permacath placement she received Versed  and fentanyl  and subsequently underwent respiratory arrest.  She required Narcan  before returning to spontaneous respirations and then was transferred to the ICU. Patient became increasingly dyspneic on 11/27 and CXR revealed worsening right-sided pleural effusion.  She underwent thoracentesis 1.2 L fluid removed. Patient tolerated dialysis well on 11/28.  She  continues to have significant improvement.  She still pending permacath placement  Subjective: Patient has been experiencing nausea overnight.  She felt better this morning and attempted to eat breakfast but has began vomiting during my evaluation.  Objective: Vitals:   02/21/24 2024 02/22/24 0340 02/22/24 0506 02/22/24 0745  BP: 120/81  130/83 (!) 154/88  Pulse: 79  76 87  Resp: 18  20 17   Temp: 97.7 F (36.5 C)  97.6 F (36.4 C) 97.6 F (36.4 C)  TempSrc:   Oral Oral  SpO2: 90%  93% 100%  Weight:  77.1 kg    Height:        Intake/Output Summary (Last 24 hours) at 02/22/2024 1259 Last data filed at 02/22/2024 0100 Gross per 24 hour  Intake 410 ml  Output --  Net 410 ml   Filed Weights   02/20/24 0506 02/21/24 0452 02/22/24 0340  Weight: 73 kg 73.9 kg 77.1 kg    Examination: General exam: Acute distress, nontoxic-appearing, appears more energetic than prior evaluations Respiratory system: No work of breathing, breathing easily, clear to auscultation bilaterally cardiovascular system: S1 & S2 heard, RRR.  Gastrointestinal system: Abdomen is nondistended, soft and nontender.  Neuro: Alert and oriented. No focal neurological deficits  Assessment & Plan:  Principal Problem:   CAP (community acquired pneumonia) Active Problems:   Acute renal failure   Encounter for dialysis and dialysis catheter care   Cardiac arrest (HCC)   Pressure injury of skin     PEA arrest x2 -Patient has extensive cardiac history including significantly reduced EF and PPM ICD in place. - Continue monitoring on telemetry ICU - Cardiology consulted, saw patient once on 11/26.  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  No plans for inpatient ischemic workup at this time.  Appreciate any follow-up care. - Continue monitoring on telemetry  Respiratory arrest - Occurred after receiving 2 mg Versed  and 50 mcg of fentanyl  as conscious sedation appropriation for permacath.  Patient was  given Narcan  and bagged.  Return to spontaneous respiration. - Continue monitoring respiratory status closely - Currently on nasal cannula and O2 sats are 99%  Acute hypoxic respiratory failure  Pulm edema Moderate to large pleural effusion  -CT on arrival showed diffuse bronchial wall thickening with mucous plugging, later showed rapid development of pulmonary edema and pleural effusions - 11/27 1.2 L thoracentesis off the right lung.  This is third thoracentesis this month. - Has been on IV Lasix  but patient has had minimal urinary output.  Discontinued IV Lasix  11/29 - Volume management will be through hemodialysis moving forward -Continue with symptomatic support, nebulizers  Parainfluenza virus 3 infection Continue supportive care with nebulizers, Tessalon  - Flu/COVID/RSV negative multiple times this month   Possible PNA -Status post 7 days Unasyn  - CXR 11/27 with worsening pleural effusion and atelectasis.  Could be obscuring pneumonia.  No fever presently.  No WBC elevation.  Acute on chronic Heart failure with reduced EF 20 to 25%  PPM-ICD -Hemodialysis now for volume removal, tolerating dialysis better now. -Plan for dialysis today -EF this admission 30 to 35%, global hypokinesis   Oliguric AKI superimposed on CKD stage IIIb, has now progressed to ESRD  Metabolic acidosis -May be secondary to ATN from hypotension as well as IV contrast exposure, we were hopeful she would make renal recovery but she is increasingly hyperkalemic, nauseated, with recurrent effusions. - She will likely need lifelong dialysis.  Outpatient hemodialysis has already been established - Nephrology planning for dialysis today - Attempted permacath on 11/25 but this was aborted due to respiratory arrest. - Planning for [facility] tomorrow.  Hold Eliquis  starting tonight - Status post bicarb drip - Started on hemodialysis 11/23 - Strict I's and O's, patient endorses urination but output has been too  minimal to record per RN  Electrolyte derangements Hypocalcemia - Status post replacement.  Improved now.  Continue to monitor daily CMP  Hypokalemia - Replace as needed - Follow-up CMP  Mild hyperkalemia -Dialysis and shifters again today.  Monitor CMP daily   History of PAF  - Continue carvedilol  --switched from Xarelto  to Eliquis  due to kidney function  Hx of PE --switched from Xarelto  to Eliquis  due to kidney function --cont Eliquis    DM2 Hypoglycemia --hypoglycemia due to poor oral intake -- Sliding scale very sensitive.  Discontinue basal insulin   Severely deconditioned Poor prognosis - Patient is starting to make recovery now.  Suspect she will be very deconditioned and would benefit from SNF stay.  Proceed with PT/OT.  Initially dietitian was recommending Dobbhoff tube for nutrition but she is now maintaining her diet p.o.   -Palliative care for ongoing GOC conversations.  Presently patient desires full code and aggressive measures   Acute 7th rib fracture --Acute minimally displaced fracture of the left anterolateral seventh rib.  --from CPR during codes --pt doesn't want to take opioid pain meds. --tylenol  PRN  Body mass index is 32.12 kg/m. - Outpatient follow up for lifestyle modification and risk factor management   DVT prophylaxis: Eliquis  on hold for planned permacath in a.m.   Code Status: Full Code Disposition: Continue to monitor on telemetry.  Pending significant clinical improvement. Permacath tomorrow  Consultants:  Procedures:  attempted permacath 11/25  Antimicrobials:  Anti-infectives (From admission, onward)    Start     Dose/Rate Route Frequency Ordered Stop   02/16/24 0915  ceFAZolin  (ANCEF ) IVPB 1 g/50 mL premix  Status:  Discontinued        1 g 100 mL/hr over 30 Minutes Intravenous 30 min pre-op 02/16/24 0916 02/16/24 1232   02/11/24 1200  Ampicillin -Sulbactam (UNASYN ) 3 g in sodium chloride  0.9 % 100 mL IVPB  Status:   Discontinued        3 g 200 mL/hr over 30 Minutes Intravenous Every 12 hours 02/11/24 0749 02/16/24 1922   02/10/24 2230  azithromycin  (ZITHROMAX ) 500 mg in sodium chloride  0.9 % 250 mL IVPB  Status:  Discontinued        500 mg 250 mL/hr over 60 Minutes Intravenous Every 24 hours 02/10/24 0146 02/10/24 1016   02/10/24 0245  Ampicillin -Sulbactam (UNASYN ) 3 g in sodium chloride  0.9 % 100 mL IVPB  Status:  Discontinued        3 g 200 mL/hr over 30 Minutes Intravenous Every 6 hours 02/10/24 0158 02/11/24 0749   02/09/24 2115  cefTRIAXone  (ROCEPHIN ) 1 g in sodium chloride  0.9 % 100 mL IVPB        1 g 200 mL/hr over 30 Minutes Intravenous  Once 02/09/24 2108 02/09/24 2231   02/09/24 2115  azithromycin  (ZITHROMAX ) 500 mg in sodium chloride  0.9 % 250 mL IVPB        500 mg 250 mL/hr over 60 Minutes Intravenous  Once 02/09/24 2108 02/09/24 2336       Data Reviewed: I have personally reviewed following labs and imaging studies CBC: Recent Labs  Lab 02/17/24 1359 02/19/24 0500 02/20/24 0354 02/21/24 0504 02/22/24 0215  WBC 7.7 7.3 8.2 8.6 8.3  NEUTROABS 5.6 3.9 5.1 5.7 5.6  HGB 12.7 13.3 13.1 13.4 13.2  HCT 38.7 40.7 40.5 40.8 40.4  MCV 87.4 88.3 88.8 88.1 88.6  PLT 178 199 198 195 180   Basic Metabolic Panel: Recent Labs  Lab 02/18/24 0542 02/19/24 0500 02/19/24 0940 02/20/24 0354 02/21/24 0504 02/22/24 0215  NA 138 131*  --  131* 129* 125*  K 2.8* 6.1* 4.2 4.8 5.5* 5.4*  CL 109 96*  --  95* 92* 89*  CO2 18* 25  --  27 27 26   GLUCOSE 81 128*  --  148* 164* 176*  BUN 30* 43*  --  28* 33* 40*  CREATININE 1.56* 2.37*  --  1.66* 1.82* 2.05*  CALCIUM  6.0* 9.3  --  8.8* 9.0 8.8*  MG 1.7 2.4  --  2.0 2.1 2.4  PHOS 3.0 4.4  --  3.4 4.2 4.8*   GFR: Estimated Creatinine Clearance: 22.3 mL/min (A) (by C-G formula based on SCr of 2.05 mg/dL (H)). Liver Function Tests: Recent Labs  Lab 02/18/24 0542 02/19/24 0500 02/20/24 0354 02/21/24 0504 02/22/24 0215  AST  --  76* 54* 39  40  ALT  --  97* 75* 64* 51*  ALKPHOS  --  81 79 77 73  BILITOT  --  0.8 0.7 0.8 0.7  PROT  --  6.9 6.8 6.7 6.4*  ALBUMIN 2.2* 3.4* 3.2* 3.4* 3.2*   CBG: Recent Labs  Lab 02/21/24 1135 02/21/24 1628 02/21/24 2103 02/22/24 0754 02/22/24 1152  GLUCAP 183* 200* 174* 170* 258*    Recent Results (from the past 240 hours)  Resp panel by RT-PCR (RSV, Flu A&B, Covid) Anterior Nasal Swab  Status: None   Collection Time: 02/17/24  6:19 PM   Specimen: Anterior Nasal Swab  Result Value Ref Range Status   SARS Coronavirus 2 by RT PCR NEGATIVE NEGATIVE Final    Comment: (NOTE) SARS-CoV-2 target nucleic acids are NOT DETECTED.  The SARS-CoV-2 RNA is generally detectable in upper respiratory specimens during the acute phase of infection. The lowest concentration of SARS-CoV-2 viral copies this assay can detect is 138 copies/mL. A negative result does not preclude SARS-Cov-2 infection and should not be used as the sole basis for treatment or other patient management decisions. A negative result may occur with  improper specimen collection/handling, submission of specimen other than nasopharyngeal swab, presence of viral mutation(s) within the areas targeted by this assay, and inadequate number of viral copies(<138 copies/mL). A negative result must be combined with clinical observations, patient history, and epidemiological information. The expected result is Negative.  Fact Sheet for Patients:  bloggercourse.com  Fact Sheet for Healthcare Providers:  seriousbroker.it  This test is no t yet approved or cleared by the [place]  FDA and  has been authorized for detection and/or diagnosis of SARS-CoV-2 by FDA under an Emergency Use Authorization (EUA). This EUA will remain  in effect (meaning this test can be used) for the duration of the COVID-19 declaration under Section 564(b)(1) of the Act, 21 U.S.C.section 360bbb-3(b)(1),  unless the authorization is terminated  or revoked sooner.       Influenza A by PCR NEGATIVE NEGATIVE Final   Influenza B by PCR NEGATIVE NEGATIVE Final    Comment: (NOTE) The Xpert Xpress SARS-CoV-2/FLU/RSV plus assay is intended as an aid in the diagnosis of influenza from Nasopharyngeal swab specimens and should not be used as a sole basis for treatment. Nasal washings and aspirates are unacceptable for Xpert Xpress SARS-CoV-2/FLU/RSV testing.  Fact Sheet for Patients: bloggercourse.com  Fact Sheet for Healthcare Providers: seriousbroker.it  This test is not yet approved or cleared by the [place]  FDA and has been authorized for detection and/or diagnosis of SARS-CoV-2 by FDA under an Emergency Use Authorization (EUA). This EUA will remain in effect (meaning this test can be used) for the duration of the COVID-19 declaration under Section 564(b)(1) of the Act, 21 U.S.C. section 360bbb-3(b)(1), unless the authorization is terminated or revoked.     Resp Syncytial Virus by PCR NEGATIVE NEGATIVE Final    Comment: (NOTE) Fact Sheet for Patients: bloggercourse.com  Fact Sheet for Healthcare Providers: seriousbroker.it  This test is not yet approved or cleared by the [place]  FDA and has been authorized for detection and/or diagnosis of SARS-CoV-2 by FDA under an Emergency Use Authorization (EUA). This EUA will remain in effect (meaning this test can be used) for the duration of the COVID-19 declaration under Section 564(b)(1) of the Act, 21 U.S.C. section 360bbb-3(b)(1), unless the authorization is terminated or revoked.  Performed at [facility], [address] [place], [place] [address]      Radiology Studies: No results found.   Scheduled Meds:  sodium chloride    Intravenous Once   albuterol   2.5 mg Nebulization Once   apixaban   5 mg Oral  BID   vitamin C   500 mg Oral BID   carvedilol   6.25 mg Oral BID WC   Chlorhexidine  Gluconate Cloth  6 each Topical Daily   feeding supplement (NEPRO CARB STEADY)  237 mL Oral BID BM   gabapentin   100 mg Oral BID   insulin  aspart  0-6 Units Subcutaneous TID WC  lidocaine   1 patch Transdermal Q24H   multivitamin  1 tablet Oral QHS   multivitamin with minerals  1 tablet Oral Daily   sodium chloride  flush  3 mL Intravenous Q12H   Continuous Infusions:   LOS: 13 days  CRITICAL CARETotal critical care time: 65 minutes Critical care time was exclusive of separately billable procedures and treating other patients. Critical care was necessary to treat or prevent imminent or life-threatening deterioration. Critical care was time spent personally by me on the following activities: development of treatment plan with patient and/or surrogate as well as nursing, discussions with consultants, evaluation of patient's response to treatment, examination of patient, obtaining history from patient or surrogate, ordering and performing treatments and interventions, ordering and review of laboratory studies, ordering and review of radiographic studies, pulse oximetry and re-evaluation of patient's condition.   Deborah Nall, DO [facility]  To contact the attending physician between 7A-7P please use Epic Chat. To contact the covering physician during after hours 7P-7A, please review Amion.  02/22/2024, 12:59 PM   *This document has been created with the assistance of dictation software. Please excuse typographical errors. *

## 2024-02-22 NOTE — TOC Initial Note (Signed)
 Transition of Care [facility]) - Initial/Assessment Note    Patient Details  Name: Deborah Dawson MRN: [id number] Date of Birth: 08/04/1950  Transition of Care [facility]) CM/SW Contact:    Alfonso Rummer, LCSW Phone Number: 02/22/2024, 5:32 PM  Clinical Narrative:                 [facility] [place] and does not report concerns affording medication. Pt pcp is HANDE, VISHWANATH. Pt lives with spouse and reports she has adequate support. LCSW A Desmond Tufano informed pt of home health recommendations. Pt unsure at the current moment due to not feeling well. TOC will continue to follow pt to ensure safe discharge.     Expected Discharge Plan: Home w Home Health Services Barriers to Discharge: Continued Medical Work up   Patient Goals and CMS Choice Patient states their goals for this hospitalization and ongoing recovery are:: Husband would like for her to return home and be able to do the things that they enjoy doing          Expected Discharge Plan and Services       Living arrangements for the past 2 months: Single Family Home                                      Prior Living Arrangements/Services Living arrangements for the past 2 months: Single Family Home Lives with:: Spouse Patient language and need for interpreter reviewed:: Yes Do you feel safe going back to the place where you live?: Yes      Need for Family Participation in Patient Care: Yes (Comment) Care giver support system in place?: Yes (comment)   Criminal Activity/Legal Involvement Pertinent to Current Situation/Hospitalization: No - Comment as needed  Activities of Daily Living   ADL Screening (condition at time of admission) Independently performs ADLs?: Yes (appropriate for developmental age) Is the patient deaf or have difficulty hearing?: No Does the patient have difficulty seeing, even when wearing glasses/contacts?: No Does the patient have difficulty concentrating, remembering, or making  decisions?: No  Permission Sought/Granted Permission sought to share information with : Family Supports                Emotional Assessment Appearance:: Appears stated age Attitude/Demeanor/Rapport: Unable to Assess Affect (typically observed): Unable to Assess   Alcohol / Substance Use: Not Applicable Psych Involvement: No (comment)  Admission diagnosis:  Cardiac arrest (HCC) [I46.9] CAP (community acquired pneumonia) [J18.9] Dyspnea, unspecified type [R06.00] Community acquired pneumonia, unspecified laterality [J18.9] Patient Active Problem List   Diagnosis Date Noted   Pressure injury of skin 02/14/2024   Cardiac arrest (HCC) 02/10/2024   CAP (community acquired pneumonia) 02/09/2024   Acute on chronic systolic CHF (congestive heart failure) (HCC) 01/27/2024   Acute renal failure 01/27/2024   Paroxysmal atrial fibrillation (HCC) 01/27/2024   Uncontrolled type 2 diabetes mellitus with hyperglycemia, with long-term current use of insulin  (HCC) 01/27/2024   Pleural effusion on right 01/25/2024   Acute pulmonary embolism (HCC) 01/24/2024   Malnutrition of moderate degree 08/09/2020   COVID-19 with multiple comorbidities 08/08/2020   Acute pulmonary edema (HCC) 06/24/2015   Acute respiratory failure with hypoxia (HCC) 06/24/2015   Malignant essential hypertension 06/24/2015   Chest pain 06/24/2015   Atrial fibrillation with RVR (HCC) 06/24/2015   Leukocytosis 06/24/2015   Heart failure (HCC) 06/24/2015   Encounter for dialysis and dialysis catheter care  06/23/2014   Chest wall pain 06/23/2014   PCP:  Sadie Manna, MD Pharmacy:   [facility] [phone] [address] [place], [place] - [address] AT [facility] [address] [place] [address] Phone: [phone] Fax: [phone]  [facility] [address] [place] [address] Phone: [phone] Fax: [phone]     Social Drivers of Health  (SDOH) Social History: SDOH Screenings   Food Insecurity: Patient Unable To Answer (02/10/2024)  Housing: Patient Unable To Answer (02/10/2024)  Transportation Needs: Patient Unable To Answer (02/10/2024)  Utilities: Patient Unable To Answer (02/10/2024)  Financial Resource Strain: Low Risk  (11/02/2023)   Received from [facility]  Social Connections: Patient Unable To Answer (02/10/2024)  Tobacco Use: Low Risk  (02/22/2024)   SDOH Interventions:     Readmission Risk Interventions     No data to display

## 2024-02-22 NOTE — Progress Notes (Signed)
 Progress Note    02/22/2024 12:42 PM 6 Days Post-Op  Subjective:  Deborah Dawson is a  73 y.o. female with medical history significant of PAF on Eliquis , chronic HFrEF LVEF 25% with recurrent right-sided pleural effusion, PPM-ICD, HTN, IDDM, gout, presented with worsening of shortness of breath and leg swelling. Patient was admitted to [facility] for CHF and pulmonary edema related to end stage renal failure. Vascular Surgery was consulted to place dialysis perma catheter.  Prior to the start of the procedure the patient was given fentanyl  and Versed  and she went into respiratory arrest.  ROSC was obtained patient was taken back to ICU.  Nephrology is reconsulted vascular surgery for placement of a dialysis permacatheter for long-term outpatient hemodialysis.  Vascular surgery will evaluate.   Vitals:   02/22/24 0506 02/22/24 0745  BP: 130/83 (!) 154/88  Pulse: 76 87  Resp: 20 17  Temp: 97.6 F (36.4 C) 97.6 F (36.4 C)  SpO2: 93% 100%   Physical Exam: Cardiac: Irregular rate with atrial fibrillation., normal S1 and S2.  No murmurs noted. Lungs: Patient remains on 2 L nasal cannula oxygen.  Labored breathing noted with ambulation.  On auscultation patient noted to have rhonchorous lungs with rales in bilateral bases. Incisions: None Extremities: All extremities warm to touch with palpable pulses.  Bilateral lower extremities with +1 to +2 edema. Abdomen: Positive bowel sounds throughout, soft, nontender and nondistended. Neurologic: Alert and oriented x 3, answers all questions and follows commands appropriately.  CBC    Component Value Date/Time   WBC 8.3 02/22/2024 0215   RBC 4.56 02/22/2024 0215   HGB 13.2 02/22/2024 0215   HCT 40.4 02/22/2024 0215   PLT 180 02/22/2024 0215   MCV 88.6 02/22/2024 0215   MCH 28.9 02/22/2024 0215   MCHC 32.7 02/22/2024 0215   RDW 17.7 (H) 02/22/2024 0215   LYMPHSABS 1.5 02/22/2024 0215   MONOABS 0.8 02/22/2024 0215   EOSABS 0.3 02/22/2024 0215    BASOSABS 0.0 02/22/2024 0215    BMET    Component Value Date/Time   NA 125 (L) 02/22/2024 0215   K 5.4 (H) 02/22/2024 0215   CL 89 (L) 02/22/2024 0215   CO2 26 02/22/2024 0215   GLUCOSE 176 (H) 02/22/2024 0215   BUN 40 (H) 02/22/2024 0215   CREATININE 2.05 (H) 02/22/2024 0215   CALCIUM  8.8 (L) 02/22/2024 0215   GFRNONAA 25 (L) 02/22/2024 0215   GFRAA >60 06/26/2015 0601    INR    Component Value Date/Time   INR 2.5 (H) 02/10/2024 0213     Intake/Output Summary (Last 24 hours) at 02/22/2024 1242 Last data filed at 02/22/2024 0100 Gross per 24 hour  Intake 410 ml  Output --  Net 410 ml     Assessment/Plan:  73 y.o. female is s/p an attempt at placing dialysis permacatheter last week with respiratory arrest.  6 Days Post-Op   PLAN Vascular surgery plans on taking the patient to the vascular lab tomorrow for placement of a dialysis permacatheter for outpatient hemodialysis.  I had a long discussion with the patient again at the bedside today regarding the procedure, benefits, risk, complications.  Patient belies understanding wishes to proceed.  I answered all her questions today.  Patient will be made n.p.o. after midnight tonight for procedure tomorrow.  Patient is currently on Eliquis  5 mg twice daily.  This medication will be held tonight and tomorrow morning prior to procedure.   DVT prophylaxis: Eliquis  5 mg twice daily.  I discussed the case in detail with Dr. [name] MD and agrees with plan.   [name] [facility] 02/22/2024 12:42 PM

## 2024-02-22 NOTE — Progress Notes (Signed)
 Patient has been progressively SOB all day with acute worsening now. CXR reveals worsening recurrent Left Effusion. Presently, she is maintaining O2 sat >90% on 2L but has increased work of breathing.  Have ordered lasix  80 IV BID, but urinary output has been minimal. Can use Bipap PRN for patient comfort if work of breathing worsens.  Have placed IR consult for repeat thoracentesis but this will not occur overnight. Transfer to Stepdown unit for closer monitoring incase patient requires airway protection or urgent thoracentesis overnight. I have discussed with the intensivist teams Dr. Aleskerov and NP Dana.

## 2024-02-22 NOTE — Progress Notes (Signed)
 Occupational Therapy Treatment Patient Details Name: Deborah Dawson MRN: [id number] DOB: Apr 13, 1950 Today's Date: 02/22/2024   History of present illness Pt is a 73 y/o F presenting to ED with c/o worsening dyspnea. Code blue x2 in ED, received 2 rounds of chest compressions and was subsequently intubated. MD assessment includes CAP, recurrent pleural effusions, mild hyperkalemia. Pt underwent thoracocentesis on 02/11/24, started HD on 02/14/24 . PMH significant for PAF, PE on Xarelto , chronic HFrEF LVEF 25%, recurrent R sided pleural effusion, HTN, IDDM, gout.   OT comments  Patient seen for OT treatment on this date. Upon arrival to room patient resting in bed, reports not having a good day and has been nauseated and vomitting most of the day, she is declining any OOB activity; OT able to provide patient with grooming tools to perform at bed level. Discussed home set up and wrote out list of recommendations for modifying home to improve efficiency and safety with basic ADLs, patient agreeable to discuss with spouse. Session limited due to patient not feeling well, unsure if she is going to have HD today or tomorrow because she is awaiting permacath placement.  Patient ended treatment in bed with all needs within reach. Patient making limited progress toward goals, will continue to follow POC. Discharge recommendation remains appropriate.        If plan is discharge home, recommend the following:  A little help with walking and/or transfers;A little help with bathing/dressing/bathroom;Help with stairs or ramp for entrance;Assistance with cooking/housework   Equipment Recommendations  BSC/3in1    Recommendations for Other Services      Precautions / Restrictions Precautions Precautions: Fall Recall of Precautions/Restrictions: Intact Precaution/Restrictions Comments: Per nursing orders keep HOB elevated >30 deg. chest/rib pain Restrictions Weight Bearing Restrictions Per Provider Order: No        Mobility Bed Mobility               General bed mobility comments: refused, n/v    Transfers                   General transfer comment: refused n/v     Balance                                           ADL either performed or assessed with clinical judgement   ADL Overall ADL's : Needs assistance/impaired     Grooming: Set up;Wash/dry face;Bed level                                 General ADL Comments: provided education and handout on energy conservation techniques to implement at home into ADL routine    Extremity/Trunk Assessment              Vision       Perception     Praxis     Communication Communication Communication: No apparent difficulties   Cognition Arousal: Alert Behavior During Therapy: WFL for tasks assessed/performed Cognition: No apparent impairments                               Following commands: Intact        Cueing   Cueing Techniques: Verbal cues  Exercises      Shoulder Instructions  General Comments      Pertinent Vitals/ Pain       Pain Assessment Pain Assessment: No/denies pain  Home Living                                          Prior Functioning/Environment              Frequency  Min 2X/week        Progress Toward Goals  OT Goals(current goals can now be found in the care plan section)  Progress towards OT goals: Not progressing toward goals - comment (limited tolerance today due to n/v)  Acute Rehab OT Goals Patient Stated Goal: to go home OT Goal Formulation: With patient Time For Goal Achievement: 03/01/24 Potential to Achieve Goals: Good ADL Goals Pt Will Perform Grooming: with modified independence;sitting;standing Pt Will Perform Lower Body Dressing: with modified independence;sitting/lateral leans;sit to/from stand Pt Will Transfer to Toilet: with modified independence;ambulating Pt Will  Perform Toileting - Clothing Manipulation and hygiene: with modified independence;sitting/lateral leans;sit to/from stand  Plan      Co-evaluation                 AM-PAC OT 6 Clicks Daily Activity     Outcome Measure   Help from another person eating meals?: None Help from another person taking care of personal grooming?: None Help from another person toileting, which includes using toliet, bedpan, or urinal?: A Little Help from another person bathing (including washing, rinsing, drying)?: A Little Help from another person to put on and taking off regular upper body clothing?: None Help from another person to put on and taking off regular lower body clothing?: A Little 6 Click Score: 21    End of Session    OT Visit Diagnosis: Other abnormalities of gait and mobility (R26.89);Muscle weakness (generalized) (M62.81)   Activity Tolerance Treatment limited secondary to medical complications (Comment);Other (comment) (n/v)   Patient Left in bed;with call bell/phone within reach;with bed alarm set   Nurse Communication          Time: 8588-8576 OT Time Calculation (min): 12 min  Charges: OT General Charges $OT Visit: 1 Visit OT Treatments $Self Care/Home Management : 8-22 mins  Rogers Clause, OT/L MSOT, 02/22/2024

## 2024-02-22 NOTE — Progress Notes (Signed)
 Pt continues to c/o SOB despite increasing O2 to 3L and prn nebulizer.  Messaged Dezii, MD, reference same.  See orders.

## 2024-02-22 NOTE — Progress Notes (Signed)
 Received patient in bed to unit. Bedside Alert and oriented.  Informed consent signed and in chart.   TX duration: 3.25  Patient tolerated well.  Transported back to the room Bedside Alert, without acute distress.  Hand-off given to patient's nurse. Shift RN  Access used: Dialysis catheter Access issues: None  Total UF removed: 2000 Medication(s) given: Per Shift Nurse Post HD VS: T97.8-HR75-RR19 B/P150/94 Post HD weight: 75.1kg  Neville Seip, RN Kidney Dialysis Unit   02/22/24 2345  Vitals  Temp 97.8 F (36.6 C)  Temp Source Oral  BP (!) 150/94  MAP (mmHg) 121  BP Location Left Arm  BP Method Automatic  Patient Position (if appropriate) Lying  Pulse Rate 75  Pulse Rate Source Monitor  ECG Heart Rate 77  Resp 19  Weight 75.1 kg  Type of Weight Post-Dialysis  Oxygen Therapy  SpO2 99 %  O2 Device Nasal Cannula  O2 Flow Rate (L/min) 2 L/min  Patient Activity (if Appropriate) In bed  Pulse Oximetry Type Continuous  During Treatment Monitoring  Blood Flow Rate (mL/min) 0 mL/min  Arterial Pressure (mmHg) -2.83 mmHg  Venous Pressure (mmHg) -2.83 mmHg  TMP (mmHg) -11.31 mmHg  Ultrafiltration Rate (mL/min) 868 mL/min  Dialysate Flow Rate (mL/min) 300 ml/min  Dialysate Potassium Concentration 2  Dialysate Calcium  Concentration 2.5  Duration of HD Treatment -hour(s) 3.25 hour(s)  Cumulative Fluid Removed (mL) per Treatment  2000.13  HD Safety Checks Performed Yes  Intra-Hemodialysis Comments Tx completed;Tolerated well  Post Treatment  Dialyzer Clearance Lightly streaked  Liters Processed 62.6  Fluid Removed (mL) 2000 mL  Tolerated HD Treatment Yes  Hemodialysis Catheter Right Femoral vein  Placement Date: 02/14/24   Time Out: Correct patient;Correct procedure;Correct site  Orientation: Right  Access Location: Femoral vein  Site Condition No complications  Blue Lumen Status Flushed;Antimicrobial dead end cap;Heparin  locked  Red Lumen Status  Flushed;Antimicrobial dead end cap;Heparin  locked  Purple Lumen Status N/A  Catheter fill solution Heparin  1000 units/ml  Catheter fill volume (Arterial) 1.4 cc  Catheter fill volume (Venous) 1.4  Dressing Type Transparent  Dressing Status Clean, Dry, Intact  Drainage Description None  Dressing Change Due 02/26/24  Post treatment catheter status Capped and Clamped

## 2024-02-22 NOTE — Consult Note (Signed)
 NAME:  Deborah Dawson, MRN:  [id number], DOB:  1950/06/22, LOS: 13 ADMISSION DATE:  02/09/2024, CONSULTATION DATE:  02/22/24 REFERRING MD:  Dr. Lorane Poland REASON FOR CONSULT:  Acute respiratory distress   HPI  73 year old woman with HFrEF (NYHA II), PPM, LBBB, PAF on Xarelto , HTN, T2DM, CKD3a, and recent hospitalization (11/2-11/8) for acute on chronic heart failure, right pleural effusion, and small PE (status post two thoracenteses), now presents on 11/18 with progressively worsening shortness of breath since 11/13. She also reports chronic cough with clear-yellow sputum. Outpatient CXR on 11/17 showed improved aeration but persistent small pleural effusion. She denies other symptoms and has been taking medications as prescribed.   ED Course: Initial vital showed she was hypertensive (162/106), tachypneic (RR 24), and maintaining oxygen saturation of 95% on room air. She was placed on 2 L nasal cannula. Labs showed: Na 132, K 5.9, CO? 21, AG 9, BUN/Cr 35/1.24, WBC 5.7, Hgb 14.0, troponin 49; BNP, lactic acid, procalcitonin, COVID-19, and influenza A/B remained pending. CTA chest negative for PE, but did demonstrate diffuse bronchial wall thickening with mucus plugging, probable developing right lower lobe consolidation, and a small right pleural effusion. Chest X-ray showed extensive right-lung airspace disease concerning for asymmetric pulmonary edema versus pneumonia versus pulmonary hemorrhage. ECG Rate 117, irregular rhythm consistent with irregular ventricular tachycardia in the setting of chronic LBBB; mild STE in V1-V3 not meeting STEMI criteria.  The patient received IV ceftriaxone  and azithromycin  for suspected CAP and admitted to [facility] service. While awaiting inpatient bed assignment, the patient reported nausea, followed by sudden circumoral cyanosis and unresponsiveness. She was found pulseless in PEA. One round of CPR and ACLS resulted in ROSC. She remained obtunded and was emergently  intubated for airway protection. Approximately 20 minutes later, she again became pulseless in PEA, and achieved ROSC after one ACLS cycle.  Post-ROSC imaging with non-contrast CT chest revealed extensive asymmetric pulmonary edema (R > L), multifocal consolidation, moderate-large right pleural effusion, and features consistent with pulmonary hypertension. PCCM was consulted for ongoing post-cardiac arrest care and further evaluation.  SEE SIGNIFICANT EVENTS BELOW  Past Medical History  HFrEF NYHA class II with PPM in place LBBB PAF on Xarelto  HTN PE (01/24/24) T2DM CKD stage 3a? HTN  Significant Hospital Events   02/22/24: worsening dyspnea today; repeat CXR revealed worsening recurrent effusion. Increased WOB on 2L [place]. Minimal urine output. IR consult placed for repeat thoracentesis. Transferred to stepdown unit for further monitoring d/t respiratory distress. Placed on BiPAP.  Consults:  Cardiology PCCM  Procedures:  11/18: Endotracheal Intubation 11/2: Thoracentesis  Interim History / Subjective:      Micro Data:    Antimicrobials:   Anti-infectives (From admission, onward)    Start     Dose/Rate Route Frequency Ordered Stop   02/16/24 0915  ceFAZolin  (ANCEF ) IVPB 1 g/50 mL premix  Status:  Discontinued        1 g 100 mL/hr over 30 Minutes Intravenous 30 min pre-op 02/16/24 0916 02/16/24 1232   02/11/24 1200  Ampicillin -Sulbactam (UNASYN ) 3 g in sodium chloride  0.9 % 100 mL IVPB  Status:  Discontinued        3 g 200 mL/hr over 30 Minutes Intravenous Every 12 hours 02/11/24 0749 02/16/24 1922   02/10/24 2230  azithromycin  (ZITHROMAX ) 500 mg in sodium chloride  0.9 % 250 mL IVPB  Status:  Discontinued        500 mg 250 mL/hr over 60 Minutes Intravenous Every 24 hours 02/10/24 0146  02/10/24 1016   02/10/24 0245  Ampicillin -Sulbactam (UNASYN ) 3 g in sodium chloride  0.9 % 100 mL IVPB  Status:  Discontinued        3 g 200 mL/hr over 30 Minutes Intravenous Every 6 hours  02/10/24 0158 02/11/24 0749   02/09/24 2115  cefTRIAXone  (ROCEPHIN ) 1 g in sodium chloride  0.9 % 100 mL IVPB        1 g 200 mL/hr over 30 Minutes Intravenous  Once 02/09/24 2108 02/09/24 2231   02/09/24 2115  azithromycin  (ZITHROMAX ) 500 mg in sodium chloride  0.9 % 250 mL IVPB        500 mg 250 mL/hr over 60 Minutes Intravenous  Once 02/09/24 2108 02/09/24 2336      OBJECTIVE  Blood pressure (!) 141/96, pulse 72, temperature 97.8 F (36.6 C), resp. rate 18, height 5' 1 (1.549 m), weight 77.1 kg, SpO2 100%.        Intake/Output Summary (Last 24 hours) at 02/22/2024 1820 Last data filed at 02/22/2024 0100 Gross per 24 hour  Intake 210 ml  Output --  Net 210 ml   Filed Weights   02/20/24 0506 02/21/24 0452 02/22/24 0340  Weight: 73 kg 73.9 kg 77.1 kg   Physical Examination  GEN: Critically ill patient, WDWN in NAD HEENT: normocephalic, atraumatic, supple, no jvd HEART: regular rhythm, normal rate, S1, S2, no M/R/G,  LUNGS: CTAB, mild crackles without wheezes at bilateral bases, mildly increased WOB EXTREMITIES: normal bulk and tone, 1+ to 2+ lower extremity edema NEURO: No gross focal deficits. Alert and oriented x 4, follows commands.  ABDOMINAL: Soft, non-tender, non-distended, no rebound/guarding, bowel sounds x 4 SKIN: Intact, warm, no rashes lesion, or ulcer  Labs/imaging that I havepersonally reviewed  (right click and Reselect all SmartList Selections daily)   DG Chest Port 1 View Result Date: 02/22/2024 EXAM: 1 VIEW(S) XRAY OF THE CHEST 02/22/2024 05:12:34 PM COMPARISON: 02/17/2024 CLINICAL HISTORY: Dyspnea FINDINGS: LINES, TUBES AND DEVICES: Multiple wires and leads project over the chest on the frontal radiograph. Dual pacer is present and changed. LUNGS AND PLEURA: Moderate right pleural effusion increased. Right mid and lower lung airspace disease, most likely atelectasis. No pneumothorax. HEART AND MEDIASTINUM: Moderate cardiomegaly with mild interstitial edema.  BONES AND SOFT TISSUES: No acute osseous abnormality. IMPRESSION: 1. Worsened right mid and lower lung airspace disease, most likely atelectasis. 2. Reaccumulation of moderate right pleural effusion. 3. Cardiomegaly with mild interstitial edema. Electronically signed by: Rockey Kilts MD 02/22/2024 05:59 PM EST RP Workstation: [id number]     Labs   CBC: Recent Labs  Lab 02/17/24 1359 02/19/24 0500 02/20/24 0354 02/21/24 0504 02/22/24 0215  WBC 7.7 7.3 8.2 8.6 8.3  NEUTROABS 5.6 3.9 5.1 5.7 5.6  HGB 12.7 13.3 13.1 13.4 13.2  HCT 38.7 40.7 40.5 40.8 40.4  MCV 87.4 88.3 88.8 88.1 88.6  PLT 178 199 198 195 180    Basic Metabolic Panel: Recent Labs  Lab 02/18/24 0542 02/19/24 0500 02/19/24 0940 02/20/24 0354 02/21/24 0504 02/22/24 0215  NA 138 131*  --  131* 129* 125*  K 2.8* 6.1* 4.2 4.8 5.5* 5.4*  CL 109 96*  --  95* 92* 89*  CO2 18* 25  --  27 27 26   GLUCOSE 81 128*  --  148* 164* 176*  BUN 30* 43*  --  28* 33* 40*  CREATININE 1.56* 2.37*  --  1.66* 1.82* 2.05*  CALCIUM  6.0* 9.3  --  8.8* 9.0 8.8*  MG 1.7 2.4  --  2.0 2.1 2.4  PHOS 3.0 4.4  --  3.4 4.2 4.8*   GFR: Estimated Creatinine Clearance: 22.3 mL/min (A) (by C-G formula based on SCr of 2.05 mg/dL (H)). Recent Labs  Lab 02/19/24 0500 02/20/24 0354 02/21/24 0504 02/22/24 0215  WBC 7.3 8.2 8.6 8.3    Liver Function Tests: Recent Labs  Lab 02/18/24 0542 02/19/24 0500 02/20/24 0354 02/21/24 0504 02/22/24 0215  AST  --  76* 54* 39 40  ALT  --  97* 75* 64* 51*  ALKPHOS  --  81 79 77 73  BILITOT  --  0.8 0.7 0.8 0.7  PROT  --  6.9 6.8 6.7 6.4*  ALBUMIN 2.2* 3.4* 3.2* 3.4* 3.2*   No results for input(s): LIPASE, AMYLASE in the last 168 hours. No results for input(s): AMMONIA in the last 168 hours.  ABG    Component Value Date/Time   PHART 7.35 02/16/2024 1121   PCO2ART 35 02/16/2024 1121   PO2ART 78 (L) 02/16/2024 1121   HCO3 19.3 (L) 02/16/2024 1121   ACIDBASEDEF 5.6 (H) 02/16/2024 1121    O2SAT 95.5 02/16/2024 1121     Coagulation Profile: No results for input(s): INR, PROTIME in the last 168 hours.   Cardiac Enzymes: No results for input(s): CKTOTAL, CKMB, CKMBINDEX, TROPONINI in the last 168 hours.  HbA1C: Hgb A1c MFr Bld  Date/Time Value Ref Range Status  01/24/2024 10:05 AM 8.0 (H) 4.8 - 5.6 % Final    Comment:    (NOTE) Diagnosis of Diabetes The following HbA1c ranges recommended by the American Diabetes Association (ADA) may be used as an aid in the diagnosis of diabetes mellitus.  Hemoglobin             Suggested A1C NGSP%              Diagnosis  <5.7                   Non Diabetic  5.7-6.4                Pre-Diabetic  >6.4                   Diabetic  <7.0                   Glycemic control for                       adults with diabetes.    06/24/2015 04:42 PM 7.9 (H) 4.0 - 6.0 % Final    CBG: Recent Labs  Lab 02/21/24 2103 02/22/24 0754 02/22/24 1152 02/22/24 1718 02/22/24 1814  GLUCAP 174* 170* 258* 208* 178*    Review of Systems:     Past Medical History  She,  has a past medical history of Allergic genetic state, Atrial fibrillation (HCC) (2010), Cardiomyopathy (HCC), CHF (congestive heart failure) (HCC), Diabetes mellitus without complication (HCC), Dysrhythmia, Gout, H/O cardiac catheterization, Hypertension, Joint pain, Osteopenia, Presence of permanent cardiac pacemaker, and Psoriasis.   Surgical History    Past Surgical History:  Procedure Laterality Date   ABDOMINAL HYSTERECTOMY  1985   BREAST EXCISIONAL BIOPSY Left    negative years ago   BREAST SURGERY Left 1997   papilloma   CARDIAC CATHETERIZATION Left 08/01/2015   Procedure: Left Heart Cath and Coronary Angiography;  Surgeon: Vinie DELENA Jude, MD;  Location: [facility];  Service: Cardiovascular;  Laterality: Left;   CHOLECYSTECTOMY  1986  COLONOSCOPY WITH PROPOFOL  N/A 08/26/2017   Procedure: COLONOSCOPY WITH PROPOFOL ;  Surgeon: Toledo, Ladell POUR, MD;  Location: [facility];  Service: Gastroenterology;  Laterality: N/A;   ESOPHAGOGASTRODUODENOSCOPY (EGD) WITH PROPOFOL  N/A 08/26/2017   Procedure: ESOPHAGOGASTRODUODENOSCOPY (EGD) WITH PROPOFOL ;  Surgeon: Toledo, Ladell POUR, MD;  Location: [facility];  Service: Gastroenterology;  Laterality: N/A;   ESOPHAGOGASTRODUODENOSCOPY (EGD) WITH PROPOFOL  N/A 07/04/2019   Procedure: ESOPHAGOGASTRODUODENOSCOPY (EGD) WITH PROPOFOL ;  Surgeon: Toledo, Ladell POUR, MD;  Location: [facility];  Service: Gastroenterology;  Laterality: N/A;   ESOPHAGOGASTRODUODENOSCOPY (EGD) WITH PROPOFOL  N/A 06/12/2023   Procedure: ESOPHAGOGASTRODUODENOSCOPY (EGD) WITH PROPOFOL ;  Surgeon: Maryruth Ole DASEN, MD;  Location: [facility];  Service: Endoscopy;  Laterality: N/A;  DM   TUBAL LIGATION  1981     Social History   reports that she has never smoked. She has never used smokeless tobacco. She reports that she does not drink alcohol and does not use drugs.   Family History   Her family history includes Breast cancer (age of onset: 68) in her mother; Cancer (age of onset: 63) in her mother; Coronary artery disease in her father and mother; Diabetes in her mother and son; Stroke in her father.   Allergies Allergies  Allergen Reactions   Doxazosin Other (See Comments)    Cardura - cough   Doxycycline      Abdominal pain   Drug Class [Clindamycin/Lincomycin] Hives   Hydralazine  Hcl Other (See Comments)    gout   Metformin  And Related Swelling   Ciprofloxacin Other (See Comments)    Numbness in face   Iodine Other (See Comments)    NOT CT CONTRAST PER PT   Pioglitazone Other (See Comments)   Sacubitril-Valsartan Cough    Pt states that it gave her a cough and chest palpitations   Semaglutide Nausea Only   Sulfamethoxazole-Trimethoprim Hives   Augmentin [Amoxicillin-Pot Clavulanate] Diarrhea   Dulaglutide Nausea Only   Hctz [Hydrochlorothiazide] Other (See Comments)    Gout   Losartan Diarrhea    Poison Oak Extract Rash   Red Dye #40 (Allura Red) Rash   Home Medications  Prior to Admission medications   Medication Sig Start Date End Date Taking? Authorizing Provider  carvedilol  (COREG ) 12.5 MG tablet Take 1 tablet (12.5 mg total) by mouth 2 (two) times daily with a meal. 01/30/24  Yes Wieting, Richard, MD  gabapentin  (NEURONTIN ) 100 MG capsule Take 100 mg by mouth 2 (two) times daily. 09/28/23  Yes [provider]  insulin  glargine (LANTUS  SOLOSTAR) 100 UNIT/ML Solostar Pen Inject 20 Units into the skin at bedtime. 01/30/24 01/29/25 Yes Wieting, Richard, MD  isosorbide  mononitrate (IMDUR ) 30 MG 24 hr tablet Take 1 tablet (30 mg total) by mouth daily. 01/30/24  Yes Josette Ade, MD  Magnesium  Gluconate 550 MG TABS Take by mouth.   Yes [provider]  RIVAROXABAN  (XARELTO ) VTE STARTER PACK (15 & 20 MG) Follow package directions: Take one 15mg  tablet by mouth twice a day. On day 22, switch to one 20mg  tablet once a day. Take with food. 01/30/24  Yes Wieting, Richard, MD  spironolactone  (ALDACTONE ) 25 MG tablet Take 1/2 tablet (12.5 mg total) by mouth daily. 01/30/24  Yes Josette Ade, MD  Vitamin D, Ergocalciferol, 2000 units CAPS Take 1 capsule by mouth daily.   Yes [provider]  Scheduled Meds:  sodium chloride    Intravenous Once   albuterol   2.5 mg Nebulization Once   vitamin C   500 mg Oral BID   carvedilol   6.25 mg Oral BID WC   Chlorhexidine  Gluconate Cloth  6 each Topical Daily   feeding supplement (NEPRO CARB STEADY)  237 mL Oral BID BM   furosemide   80 mg Intravenous Q12H   gabapentin   100 mg Oral BID   insulin  aspart  0-6 Units Subcutaneous TID WC   lidocaine   1 patch Transdermal Q24H   multivitamin  1 tablet Oral QHS   multivitamin with minerals  1 tablet Oral Daily   sodium chloride  flush  3 mL Intravenous Q12H   Continuous Infusions:   PRN Meds:.acetaminophen , benzonatate , hydrALAZINE , HYDROcodone  bit-homatropine, hydrocortisone  cream,  ipratropium-albuterol , menthol , metoCLOPramide (REGLAN) injection, mouth rinse, mouth rinse, oxyCODONE , polyethylene glycol, prochlorperazine , promethazine , sodium chloride    Active Hospital Problem list   See systems below  Assessment & Plan:  #Acute Hypoxic Respiratory Failure secondary to: #Acute Exacerbation of HFrEF #Parainfluenza Virus 3 Infection ~ Resolved #CAP ~ Resolved #Recurrent Right Sided Pleural Effusion  02/22/24 CXR: Worsening right mid and lower lung airspace disease; re-accumulation of moderate right pleural effusion; cardiomegaly with mild interstitial edema 02/11/24 Thoracentesis: concerning for underlying lymphoproliferative disease  Hx: Intubated 11/18 s/p extubation 11/20 - Supplemental O2 as needed to maintain sats 88-92% - Placed on BiPAP once arrived to ICU - High risk for intubation - Intermittent Chest X-ray & ABG as needed - Bronchodilators and Pulmicort nebs - Incentive spirometry - IR consulted for repeat thoracentesis s/t recurrent pleural effusion   #Acute on Chronic HFrEF, NYHA II #Cardiac arrest -Brief PEA arrests on 11/19 ~ Resolved #Elevated Troponin likely demand iso above ~ Resolved #PAF / recent PE #Hypertension ECHO (11/19): LVEF 30-35%, global hypokinesis, moderately reduced RV function with mild enlargement and mild RV thickness.  - Continuous cardiac monitoring - Vasopressors to maintain MAP goal >65 ~ not currently requiring - Lasix  80mg  Q12h - Hold anticoagulation for now (Eliquis  5mg  twice daily) - Continue Carvedilol  - Hydralazine  10mg  Q4h prn for SBP >165 - EKG prn - Cardiology following; appreciate input  #AKI on CKD stage III - Volume Overload #Hyponatremia #Hyperkalemia Renal US : negative for hydronephrosis - Strict I&O - Trend BMP, Mag and Phosphorous - Creatinine worsening at 2.05 with a BUN of 40 - Ensure adequate renal perfusion - Avoid nephrotoxic agents as able - ICU electrolyte replacement protocol - Pharmacy  to assist with replacement as indicated - Received Lokelma  x 1 - Nephrology following; will receive HD tonight - Vascular will take her to the vascular lab tomorrow for PermaCath placement  #Nausea and Vomiting - Monitor hepatic function - Diet: NPO - Constipation protocol prn - Reglan 5mg  IV Q8h prn - Compazine  5mg  IV Q4h prn - Phenergan  25mg  Q6h prn  #Type II Diabetes Mellitus Recent (HgbA1c 8.0%) - ICU hypo/hyperglycemia protocol  - SSI - CBG Q4h - Target CBG readings 140 to 180  #CAP ~ Resolved #Parainfluenza Virus 3 ~ Resolved - Monitor WBC and fever curve - Remains without leukocytosis or fever -    Best practice:  Diet:  Oral Pain/Anxiety/Delirium protocol (if indicated): No VAP protocol (if indicated): Not indicated DVT prophylaxis: N/A GI prophylaxis: N/A Glucose control:  SSI Yes and Basal insulin  Yes Central venous access:  N/A Arterial line:  N/A Foley:  N/A Mobility:  bed rest  PT/OT consulted: Yes Code Status:  full code Disposition: Stepdown    Critical care time: 55 minutes      Jaydian Santana, PA-C Pulmonary/Critical Care PCCM Team Contact Info: [phone]

## 2024-02-23 ENCOUNTER — Inpatient Hospital Stay

## 2024-02-23 DIAGNOSIS — I469 Cardiac arrest, cause unspecified: Secondary | ICD-10-CM | POA: Diagnosis not present

## 2024-02-23 DIAGNOSIS — Z4901 Encounter for fitting and adjustment of extracorporeal dialysis catheter: Secondary | ICD-10-CM | POA: Diagnosis not present

## 2024-02-23 DIAGNOSIS — J189 Pneumonia, unspecified organism: Secondary | ICD-10-CM | POA: Diagnosis not present

## 2024-02-23 DIAGNOSIS — N179 Acute kidney failure, unspecified: Secondary | ICD-10-CM | POA: Diagnosis not present

## 2024-02-23 LAB — PHOSPHORUS: Phosphorus: 3.9 mg/dL (ref 2.5–4.6)

## 2024-02-23 LAB — GLUCOSE, PLEURAL OR PERITONEAL FLUID: Glucose, Fluid: 130 mg/dL

## 2024-02-23 LAB — BASIC METABOLIC PANEL WITH GFR
Anion gap: 10 (ref 5–15)
BUN: 26 mg/dL — ABNORMAL HIGH (ref 8–23)
CO2: 26 mmol/L (ref 22–32)
Calcium: 8.6 mg/dL — ABNORMAL LOW (ref 8.9–10.3)
Chloride: 92 mmol/L — ABNORMAL LOW (ref 98–111)
Creatinine, Ser: 1.36 mg/dL — ABNORMAL HIGH (ref 0.44–1.00)
GFR, Estimated: 40 mL/min — ABNORMAL LOW (ref >60)
Glucose, Bld: 103 mg/dL — ABNORMAL HIGH (ref 70–99)
Potassium: 3.9 mmol/L (ref 3.5–5.1)
Sodium: 128 mmol/L — ABNORMAL LOW (ref 135–145)

## 2024-02-23 LAB — COMPREHENSIVE METABOLIC PANEL WITH GFR
ALT: 45 U/L — ABNORMAL HIGH (ref 0–44)
AST: 34 U/L (ref 15–41)
Albumin: 3.3 g/dL — ABNORMAL LOW (ref 3.5–5.0)
Alkaline Phosphatase: 75 U/L (ref 38–126)
Anion gap: 11 (ref 5–15)
BUN: 28 mg/dL — ABNORMAL HIGH (ref 8–23)
CO2: 25 mmol/L (ref 22–32)
Calcium: 8.6 mg/dL — ABNORMAL LOW (ref 8.9–10.3)
Chloride: 92 mmol/L — ABNORMAL LOW (ref 98–111)
Creatinine, Ser: 1.57 mg/dL — ABNORMAL HIGH (ref 0.44–1.00)
GFR, Estimated: 34 mL/min — ABNORMAL LOW (ref >60)
Glucose, Bld: 102 mg/dL — ABNORMAL HIGH (ref 70–99)
Potassium: 4.2 mmol/L (ref 3.5–5.1)
Sodium: 128 mmol/L — ABNORMAL LOW (ref 135–145)
Total Bilirubin: 0.9 mg/dL (ref 0.0–1.2)
Total Protein: 6.5 g/dL (ref 6.5–8.1)

## 2024-02-23 LAB — CBC WITH DIFFERENTIAL/PLATELET
Abs Immature Granulocytes: 0.05 K/uL (ref 0.00–0.07)
Basophils Absolute: 0.1 K/uL (ref 0.0–0.1)
Basophils Relative: 1 %
Eosinophils Absolute: 0.3 K/uL (ref 0.0–0.5)
Eosinophils Relative: 3 %
HCT: 39.2 % (ref 36.0–46.0)
Hemoglobin: 13 g/dL (ref 12.0–15.0)
Immature Granulocytes: 1 %
Lymphocytes Relative: 17 %
Lymphs Abs: 1.4 K/uL (ref 0.7–4.0)
MCH: 29 pg (ref 26.0–34.0)
MCHC: 33.2 g/dL (ref 30.0–36.0)
MCV: 87.5 fL (ref 80.0–100.0)
Monocytes Absolute: 0.8 K/uL (ref 0.1–1.0)
Monocytes Relative: 10 %
Neutro Abs: 5.5 K/uL (ref 1.7–7.7)
Neutrophils Relative %: 68 %
Platelets: 178 K/uL (ref 150–400)
RBC: 4.48 MIL/uL (ref 3.87–5.11)
RDW: 17.5 % — ABNORMAL HIGH (ref 11.5–15.5)
WBC: 8.1 K/uL (ref 4.0–10.5)
nRBC: 1 % — ABNORMAL HIGH (ref 0.0–0.2)

## 2024-02-23 LAB — MAGNESIUM: Magnesium: 2.2 mg/dL (ref 1.7–2.4)

## 2024-02-23 LAB — GLUCOSE, CAPILLARY
Glucose-Capillary: 113 mg/dL — ABNORMAL HIGH (ref 70–99)
Glucose-Capillary: 137 mg/dL — ABNORMAL HIGH (ref 70–99)
Glucose-Capillary: 127 mg/dL — ABNORMAL HIGH (ref 70–99)
Glucose-Capillary: 248 mg/dL — ABNORMAL HIGH (ref 70–99)

## 2024-02-23 LAB — LACTATE DEHYDROGENASE, PLEURAL OR PERITONEAL FLUID: LD, Fluid: 87 U/L — ABNORMAL HIGH (ref 3–23)

## 2024-02-23 MED ORDER — LIDOCAINE HCL (PF) 1 % IJ SOLN
10.0000 mL | Freq: Once | INTRAMUSCULAR | Status: AC
  Administered 2024-02-23: 10 mL

## 2024-02-23 NOTE — Progress Notes (Signed)
 [facility] [place], [place] 02/23/24  Subjective:   Hospital day # 14  Patient moved back to critical care unit for increasing shortness of breath. Has recurrent right pleural effusion. Underwent hemodialysis treatment yesterday. UF achieved was 2 kg. Patient due for PermCath placement today.   Objective:  Vital signs in last 24 hours:  Temp:  [97.8 F (36.6 C)-98 F (36.7 C)] 97.8 F (36.6 C) (12/01 2345) Pulse Rate:  [68-92] 80 (12/02 0700) Resp:  [12-25] 18 (12/02 0700) BP: (121-154)/(63-120) 137/78 (12/02 0700) SpO2:  [91 %-100 %] 98 % (12/02 0700) FiO2 (%):  [35 %] 35 % (12/01 1943) Weight:  [71 kg-77.1 kg] 71 kg (12/02 0500)  Weight change: -0.012 kg Filed Weights   02/22/24 1930 02/22/24 2345 02/23/24 0500  Weight: 77.1 kg 75.1 kg 71 kg    Intake/Output:    Intake/Output Summary (Last 24 hours) at 02/23/2024 0751 Last data filed at 02/22/2024 2345 Gross per 24 hour  Intake --  Output 2250 ml  Net -2250 ml     Physical Exam: General: NAD  HEENT Moist oral mucous membranes  Pulm/lungs decreased breath sounds, [place] O2  CVS/Heart paced  Abdomen:  Soft, nontender, nondistended  Extremities: Trace edema  Neurologic: Alert, able to answer questions appropriately  Skin: Warm, dry  Access: Rt femoral HD temp cath       Basic Metabolic Panel:  Recent Labs  Lab 02/19/24 0500 02/19/24 0940 02/20/24 0354 02/21/24 0504 02/22/24 0215 02/23/24 0000 02/23/24 0518  NA 131*  --  131* 129* 125* 128* 128*  K 6.1*   < > 4.8 5.5* 5.4* 3.9 4.2  CL 96*  --  95* 92* 89* 92* 92*  CO2 25  --  27 27 26 26 25   GLUCOSE 128*  --  148* 164* 176* 103* 102*  BUN 43*  --  28* 33* 40* 26* 28*  CREATININE 2.37*  --  1.66* 1.82* 2.05* 1.36* 1.57*  CALCIUM  9.3  --  8.8* 9.0 8.8* 8.6* 8.6*  MG 2.4  --  2.0 2.1 2.4  --  2.2  PHOS 4.4  --  3.4 4.2 4.8*  --  3.9   < > = values in this interval not displayed.     CBC: Recent Labs  Lab 02/19/24 0500  02/20/24 0354 02/21/24 0504 02/22/24 0215 02/23/24 0518  WBC 7.3 8.2 8.6 8.3 8.1  NEUTROABS 3.9 5.1 5.7 5.6 5.5  HGB 13.3 13.1 13.4 13.2 13.0  HCT 40.7 40.5 40.8 40.4 39.2  MCV 88.3 88.8 88.1 88.6 87.5  PLT 199 198 195 180 178      Lab Results  Component Value Date   HEPBSAG NON REACTIVE 02/14/2024      Microbiology:  Recent Results (from the past 240 hours)  Resp panel by RT-PCR (RSV, Flu A&B, Covid) Anterior Nasal Swab     Status: None   Collection Time: 02/17/24  6:19 PM   Specimen: Anterior Nasal Swab  Result Value Ref Range Status   SARS Coronavirus 2 by RT PCR NEGATIVE NEGATIVE Final    Comment: (NOTE) SARS-CoV-2 target nucleic acids are NOT DETECTED.  The SARS-CoV-2 RNA is generally detectable in upper respiratory specimens during the acute phase of infection. The lowest concentration of SARS-CoV-2 viral copies this assay can detect is 138 copies/mL. A negative result does not preclude SARS-Cov-2 infection and should not be used as the sole basis for treatment or other patient management decisions. A negative result may occur with  improper specimen collection/handling, submission of specimen other than nasopharyngeal swab, presence of viral mutation(s) within the areas targeted by this assay, and inadequate number of viral copies(<138 copies/mL). A negative result must be combined with clinical observations, patient history, and epidemiological information. The expected result is Negative.  Fact Sheet for Patients:  bloggercourse.com  Fact Sheet for Healthcare Providers:  seriousbroker.it  This test is no t yet approved or cleared by the [place]  FDA and  has been authorized for detection and/or diagnosis of SARS-CoV-2 by FDA under an Emergency Use Authorization (EUA). This EUA will remain  in effect (meaning this test can be used) for the duration of the COVID-19 declaration under Section 564(b)(1)  of the Act, 21 U.S.C.section 360bbb-3(b)(1), unless the authorization is terminated  or revoked sooner.       Influenza A by PCR NEGATIVE NEGATIVE Final   Influenza B by PCR NEGATIVE NEGATIVE Final    Comment: (NOTE) The Xpert Xpress SARS-CoV-2/FLU/RSV plus assay is intended as an aid in the diagnosis of influenza from Nasopharyngeal swab specimens and should not be used as a sole basis for treatment. Nasal washings and aspirates are unacceptable for Xpert Xpress SARS-CoV-2/FLU/RSV testing.  Fact Sheet for Patients: bloggercourse.com  Fact Sheet for Healthcare Providers: seriousbroker.it  This test is not yet approved or cleared by the [place]  FDA and has been authorized for detection and/or diagnosis of SARS-CoV-2 by FDA under an Emergency Use Authorization (EUA). This EUA will remain in effect (meaning this test can be used) for the duration of the COVID-19 declaration under Section 564(b)(1) of the Act, 21 U.S.C. section 360bbb-3(b)(1), unless the authorization is terminated or revoked.     Resp Syncytial Virus by PCR NEGATIVE NEGATIVE Final    Comment: (NOTE) Fact Sheet for Patients: bloggercourse.com  Fact Sheet for Healthcare Providers: seriousbroker.it  This test is not yet approved or cleared by the [place]  FDA and has been authorized for detection and/or diagnosis of SARS-CoV-2 by FDA under an Emergency Use Authorization (EUA). This EUA will remain in effect (meaning this test can be used) for the duration of the COVID-19 declaration under Section 564(b)(1) of the Act, 21 U.S.C. section 360bbb-3(b)(1), unless the authorization is terminated or revoked.  Performed at [facility], [address] [place], [place] [address]     Coagulation Studies: No results for input(s): LABPROT, INR in the last 72 hours.  Urinalysis: No results  for input(s): COLORURINE, LABSPEC, PHURINE, GLUCOSEU, HGBUR, BILIRUBINUR, KETONESUR, PROTEINUR, UROBILINOGEN, NITRITE, LEUKOCYTESUR in the last 72 hours.  Invalid input(s): APPERANCEUR    Imaging: DG Chest Port 1 View Result Date: 02/22/2024 EXAM: 1 VIEW(S) XRAY OF THE CHEST 02/22/2024 05:12:34 PM COMPARISON: 02/17/2024 CLINICAL HISTORY: Dyspnea FINDINGS: LINES, TUBES AND DEVICES: Multiple wires and leads project over the chest on the frontal radiograph. Dual pacer is present and changed. LUNGS AND PLEURA: Moderate right pleural effusion increased. Right mid and lower lung airspace disease, most likely atelectasis. No pneumothorax. HEART AND MEDIASTINUM: Moderate cardiomegaly with mild interstitial edema. BONES AND SOFT TISSUES: No acute osseous abnormality. IMPRESSION: 1. Worsened right mid and lower lung airspace disease, most likely atelectasis. 2. Reaccumulation of moderate right pleural effusion. 3. Cardiomegaly with mild interstitial edema. Electronically signed by: Rockey Kilts MD 02/22/2024 05:59 PM EST RP Workstation: [id number]       Medications:      sodium chloride    Intravenous Once   albuterol   2.5 mg Nebulization Once   vitamin C   500 mg Oral BID  carvedilol   6.25 mg Oral BID WC   Chlorhexidine  Gluconate Cloth  6 each Topical Q0600   feeding supplement (NEPRO CARB STEADY)  237 mL Oral BID BM   furosemide   80 mg Intravenous Q12H   gabapentin   100 mg Oral BID   insulin  aspart  0-6 Units Subcutaneous TID WC   lidocaine   1 patch Transdermal Q24H   multivitamin  1 tablet Oral QHS   multivitamin with minerals  1 tablet Oral Daily   mouth rinse  15 mL Mouth Rinse 4 times per day   pantoprazole   40 mg Oral Daily   sodium chloride  flush  3 mL Intravenous Q12H   acetaminophen , alteplase , alteplase , alteplase , benzonatate , heparin , heparin , heparin , hydrALAZINE , HYDROcodone  bit-homatropine, hydrocortisone  cream, ipratropium-albuterol , menthol ,  metoCLOPramide  (REGLAN ) injection, mouth rinse, oxyCODONE , polyethylene glycol, prochlorperazine , promethazine , sodium chloride   Assessment/ Plan:  73 y.o. 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 Pneumonia treated with Unasyn  Renal US  negative for hydronephrosis. Developed respiratory arrest after fentanyl  and Versed  for PermCath placement  Plan: PermCath to be reattempted today.  Respiratory status improved status post dialysis yesterday.  UF achieved was 2 kg.  Next dialysis treatment scheduled for tomorrow.   Hyperkalemia Potassium down to 4.2 post dialysis.   Acute metabolic acidosis Serum bicarbonate at target at 25 with dialysis treatment.   Hyponatremia Serum sodium improved up to 128 now.   Chronic kidney disease, stage IIIb.  Baseline creatinine 1.4/GFR 38 from 02/10/2024.  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 Receiving hemodialysis and monitoring for renal recovery   Comorbidities Chronic systolic CHF-2D echo from 02/10/2024 show LVEF 30 to 35%, global hypokinesis, moderately reduced right ventricular systolic function   Acute pulmonary embolism-patient is currently on apixaban  5 mg twice a day, this has been held.   Right pleural effusion- Pulmonary and cardiac teams are following.  Recurrent pleural effusion now.  Thoracentesis being planned today.      LOS: 14 Deborah Dawson 12/2/20257:51 AM  [address] [place], [place] [phone]

## 2024-02-23 NOTE — Procedures (Signed)
 Vascular and Interventional Radiology Procedure Note  Patient: Deborah Dawson DOB: [birth year] Medical Record Number: [id number] Note Date/Time: 02/23/24 11:39 AM   Performing Physician: Thom Hall, MD Assistant(s): None  Diagnosis: Pleural effusion  Procedure: RIGHT THORACENTESIS  Anesthesia: Local Anesthetic Complications: None Estimated Blood Loss: Minimal Specimens:  Sent Cell count with Differential and Cytology  Findings:  The RIGHT chest was accessed with a 60F Pigtail catheter, and 1550 mL of SS fluid was obtained.  See detailed procedure note with images in PACS. The patient tolerated the procedure well without incident or complication and remained in ICU in stable condition.    Thom Hall, MD [facility] Radiology   Pager. [phone] Clinic. [phone]

## 2024-02-23 NOTE — Plan of Care (Signed)
  Problem: Clinical Measurements: Goal: Complications related to the disease process or treatment will be avoided or minimized Outcome: Progressing Goal: Dialysis access will remain free of complications Outcome: Progressing   

## 2024-02-23 NOTE — Plan of Care (Signed)
  Problem: Education: Goal: Ability to describe self-care measures that may prevent or decrease complications (Diabetes Survival Skills Education) will improve Outcome: Progressing Goal: Individualized Educational Video(s) Outcome: Progressing   Problem: Coping: Goal: Ability to adjust to condition or change in health will improve Outcome: Progressing   Problem: Fluid Volume: Goal: Ability to maintain a balanced intake and output will improve Outcome: Progressing   Problem: Health Behavior/Discharge Planning: Goal: Ability to identify and utilize available resources and services will improve Outcome: Progressing Goal: Ability to manage health-related needs will improve Outcome: Progressing   Problem: Nutritional: Goal: Maintenance of adequate nutrition will improve Outcome: Progressing Goal: Progress toward achieving an optimal weight will improve Outcome: Progressing   Problem: Skin Integrity: Goal: Risk for impaired skin integrity will decrease Outcome: Progressing   Problem: Respiratory: Goal: Ability to maintain a clear airway and adequate ventilation will improve Outcome: Progressing   Problem: Health Behavior/Discharge Planning: Goal: Ability to manage health-related needs will improve Outcome: Progressing   Problem: Clinical Measurements: Goal: Diagnostic test results will improve Outcome: Progressing   Problem: Clinical Measurements: Goal: Ability to maintain clinical measurements within normal limits will improve Outcome: Progressing   Problem: Activity: Goal: Risk for activity intolerance will decrease Outcome: Progressing

## 2024-02-23 NOTE — Progress Notes (Signed)
  Progress Note    02/23/2024 8:30 AM 7 Days Post-Op  Subjective:  Deborah Dawson is a  73 y.o. female with medical history significant of PAF on Eliquis , chronic HFrEF LVEF 25% with recurrent right-sided pleural effusion, PPM-ICD, HTN, IDDM, gout, presented with worsening of shortness of breath and leg swelling.  Patient experienced increased shortness of breath last night and was moved back to ICU for closer monitoring.  Patient underwent dialysis last night 2 L removed.  This morning patient is resting comfortably in bed.  She endorses she feels better this morning.  Patient was evaluated by the ICU team who consulted interventional radiology for right thoracentesis.  Patient will undergo this this morning.  Patient's vitals are stable this morning.  Oxygen saturation remains 92% on 2 L nasal cannula oxygen.   Vitals:   02/23/24 0600 02/23/24 0700  BP: 136/84 137/78  Pulse: 78 80  Resp: 19 18  Temp:    SpO2: 99% 98%   Physical Exam: Cardiac: Irregular rate with atrial fibrillation., normal S1 and S2.  No murmurs noted. Lungs: Patient remains on 2 L nasal cannula oxygen.  Labored breathing noted with ambulation.  On auscultation patient noted to have rhonchorous lungs with rales in bilateral bases.  On chest x-ray from yesterday patient has reaccumulation of pleural effusion. Incisions: None Extremities: All extremities warm to touch with palpable pulses.  Bilateral lower extremities with +1 to +2 edema. Abdomen: Positive bowel sounds throughout, soft, nontender and nondistended. Neurologic: Alert and oriented x 3, answers all questions and follows commands appropriately.  CBC    Component Value Date/Time   WBC 8.1 02/23/2024 0518   RBC 4.48 02/23/2024 0518   HGB 13.0 02/23/2024 0518   HCT 39.2 02/23/2024 0518   PLT 178 02/23/2024 0518   MCV 87.5 02/23/2024 0518   MCH 29.0 02/23/2024 0518   MCHC 33.2 02/23/2024 0518   RDW 17.5 (H) 02/23/2024 0518   LYMPHSABS 1.4 02/23/2024  0518   MONOABS 0.8 02/23/2024 0518   EOSABS 0.3 02/23/2024 0518   BASOSABS 0.1 02/23/2024 0518    BMET    Component Value Date/Time   NA 128 (L) 02/23/2024 0518   K 4.2 02/23/2024 0518   CL 92 (L) 02/23/2024 0518   CO2 25 02/23/2024 0518   GLUCOSE 102 (H) 02/23/2024 0518   BUN 28 (H) 02/23/2024 0518   CREATININE 1.57 (H) 02/23/2024 0518   CALCIUM  8.6 (L) 02/23/2024 0518   GFRNONAA 34 (L) 02/23/2024 0518   GFRAA >60 06/26/2015 0601    INR    Component Value Date/Time   INR 2.5 (H) 02/10/2024 0213     Intake/Output Summary (Last 24 hours) at 02/23/2024 0830 Last data filed at 02/22/2024 2345 Gross per 24 hour  Intake --  Output 2250 ml  Net -2250 ml     Assessment/Plan:  73 y.o. female is s/p attempted dialysis permacatheter insertion.  7 Days Post-Op   PLAN Patient undergo right thoracentesis for right pulmonary effusion this morning. If the patient does well we will proceed with placing dialysis permacatheter for continued need of hemodialysis.  After procedures completed by interventional radiology this morning.  DVT prophylaxis: Patient's Eliquis  was held for both procedures today right lung thoracentesis as well as dialysis access permacatheter placement.   Deborah Dawson [facility] 02/23/2024 8:30 AM

## 2024-02-23 NOTE — Progress Notes (Signed)
 NAME:  Deborah Dawson, MRN:  [id number], DOB:  12-08-1950, LOS: 14 ADMISSION DATE:  02/09/2024, CONSULTATION DATE:  02/22/24 REFERRING MD:  Dr. Lorane Poland REASON FOR CONSULT:  Acute respiratory distress   HPI  73 year old woman with HFrEF (NYHA II), PPM, LBBB, PAF on Xarelto , HTN, T2DM, CKD3a, and recent hospitalization (11/2-11/8) for acute on chronic heart failure, right pleural effusion, and small PE (status post two thoracenteses), now presents on 11/18 with progressively worsening shortness of breath since 11/13. She also reports chronic cough with clear-yellow sputum. Outpatient CXR on 11/17 showed improved aeration but persistent small pleural effusion. She denies other symptoms and has been taking medications as prescribed.   ED Course: Initial vital showed she was hypertensive (162/106), tachypneic (RR 24), and maintaining oxygen saturation of 95% on room air. She was placed on 2 L nasal cannula. Labs showed: Na 132, K 5.9, CO? 21, AG 9, BUN/Cr 35/1.24, WBC 5.7, Hgb 14.0, troponin 49; BNP, lactic acid, procalcitonin, COVID-19, and influenza A/B remained pending. CTA chest negative for PE, but did demonstrate diffuse bronchial wall thickening with mucus plugging, probable developing right lower lobe consolidation, and a small right pleural effusion. Chest X-ray showed extensive right-lung airspace disease concerning for asymmetric pulmonary edema versus pneumonia versus pulmonary hemorrhage. ECG Rate 117, irregular rhythm consistent with irregular ventricular tachycardia in the setting of chronic LBBB; mild STE in V1-V3 not meeting STEMI criteria.  The patient received IV ceftriaxone  and azithromycin  for suspected CAP and admitted to [facility] service. While awaiting inpatient bed assignment, the patient reported nausea, followed by sudden circumoral cyanosis and unresponsiveness. She was found pulseless in PEA. One round of CPR and ACLS resulted in ROSC. She remained obtunded and was emergently  intubated for airway protection. Approximately 20 minutes later, she again became pulseless in PEA, and achieved ROSC after one ACLS cycle.  Post-ROSC imaging with non-contrast CT chest revealed extensive asymmetric pulmonary edema (R > L), multifocal consolidation, moderate-large right pleural effusion, and features consistent with pulmonary hypertension. PCCM was consulted for ongoing post-cardiac arrest care and further evaluation.  02/23/24- patient for repeat thoracentesis today.  She may need to have advanced heart failure evaluation due to concern for cardiorenal syndrome with anasarca and recurrent effusions.   Past Medical History  HFrEF NYHA class II with PPM in place LBBB PAF on Xarelto  HTN PE (01/24/24) T2DM CKD stage 3a? HTN  Significant Hospital Events   02/22/24: worsening dyspnea today; repeat CXR revealed worsening recurrent effusion. Increased WOB on 2L [place]. Minimal urine output. IR consult placed for repeat thoracentesis. Transferred to stepdown unit for further monitoring d/t respiratory distress. Placed on BiPAP.  Consults:  Cardiology PCCM  Procedures:  11/18: Endotracheal Intubation 11/2: Thoracentesis   Antimicrobials:   Anti-infectives (From admission, onward)    Start     Dose/Rate Route Frequency Ordered Stop   02/16/24 0915  ceFAZolin  (ANCEF ) IVPB 1 g/50 mL premix  Status:  Discontinued        1 g 100 mL/hr over 30 Minutes Intravenous 30 min pre-op 02/16/24 0916 02/16/24 1232   02/11/24 1200  Ampicillin -Sulbactam (UNASYN ) 3 g in sodium chloride  0.9 % 100 mL IVPB  Status:  Discontinued        3 g 200 mL/hr over 30 Minutes Intravenous Every 12 hours 02/11/24 0749 02/16/24 1922   02/10/24 2230  azithromycin  (ZITHROMAX ) 500 mg in sodium chloride  0.9 % 250 mL IVPB  Status:  Discontinued        500 mg  250 mL/hr over 60 Minutes Intravenous Every 24 hours 02/10/24 0146 02/10/24 1016   02/10/24 0245  Ampicillin -Sulbactam (UNASYN ) 3 g in sodium chloride  0.9 %  100 mL IVPB  Status:  Discontinued        3 g 200 mL/hr over 30 Minutes Intravenous Every 6 hours 02/10/24 0158 02/11/24 0749   02/09/24 2115  cefTRIAXone  (ROCEPHIN ) 1 g in sodium chloride  0.9 % 100 mL IVPB        1 g 200 mL/hr over 30 Minutes Intravenous  Once 02/09/24 2108 02/09/24 2231   02/09/24 2115  azithromycin  (ZITHROMAX ) 500 mg in sodium chloride  0.9 % 250 mL IVPB        500 mg 250 mL/hr over 60 Minutes Intravenous  Once 02/09/24 2108 02/09/24 2336      OBJECTIVE  Blood pressure 137/78, pulse 80, temperature 97.8 F (36.6 C), temperature source Oral, resp. rate 18, height 5' 1 (1.549 m), weight 71 kg, SpO2 98%.    FiO2 (%):  [35 %] 35 % PEEP:  [5 cmH20] 5 cmH20 Pressure Support:  [10 cmH20] 10 cmH20   Intake/Output Summary (Last 24 hours) at 02/23/2024 1019 Last data filed at 02/22/2024 2345 Gross per 24 hour  Intake --  Output 2250 ml  Net -2250 ml   Filed Weights   02/22/24 1930 02/22/24 2345 02/23/24 0500  Weight: 77.1 kg 75.1 kg 71 kg   Physical Examination  GEN: Critically ill patient, WDWN in NAD HEENT: normocephalic, atraumatic, supple, no jvd HEART: regular rhythm, normal rate, S1, S2, no M/R/G,  LUNGS: CTAB, mild crackles without wheezes at bilateral bases, mildly increased WOB EXTREMITIES: normal bulk and tone, 1+ to 2+ lower extremity edema NEURO: No gross focal deficits. Alert and oriented x 4, follows commands.  ABDOMINAL: Soft, non-tender, non-distended, no rebound/guarding, bowel sounds x 4 SKIN: Intact, warm, no rashes lesion, or ulcer  Labs/imaging that I havepersonally reviewed  (right click and Reselect all SmartList Selections daily)   DG Chest Port 1 View Result Date: 02/22/2024 EXAM: 1 VIEW(S) XRAY OF THE CHEST 02/22/2024 05:12:34 PM COMPARISON: 02/17/2024 CLINICAL HISTORY: Dyspnea FINDINGS: LINES, TUBES AND DEVICES: Multiple wires and leads project over the chest on the frontal radiograph. Dual pacer is present and changed. LUNGS AND  PLEURA: Moderate right pleural effusion increased. Right mid and lower lung airspace disease, most likely atelectasis. No pneumothorax. HEART AND MEDIASTINUM: Moderate cardiomegaly with mild interstitial edema. BONES AND SOFT TISSUES: No acute osseous abnormality. IMPRESSION: 1. Worsened right mid and lower lung airspace disease, most likely atelectasis. 2. Reaccumulation of moderate right pleural effusion. 3. Cardiomegaly with mild interstitial edema. Electronically signed by: Rockey Kilts MD 02/22/2024 05:59 PM EST RP Workstation: [id number]     Post thoracentesis improved CXR   Labs   CBC: Recent Labs  Lab 02/19/24 0500 02/20/24 0354 02/21/24 0504 02/22/24 0215 02/23/24 0518  WBC 7.3 8.2 8.6 8.3 8.1  NEUTROABS 3.9 5.1 5.7 5.6 5.5  HGB 13.3 13.1 13.4 13.2 13.0  HCT 40.7 40.5 40.8 40.4 39.2  MCV 88.3 88.8 88.1 88.6 87.5  PLT 199 198 195 180 178    Basic Metabolic Panel: Recent Labs  Lab 02/19/24 0500 02/19/24 0940 02/20/24 0354 02/21/24 0504 02/22/24 0215 02/23/24 0000 02/23/24 0518  NA 131*  --  131* 129* 125* 128* 128*  K 6.1*   < > 4.8 5.5* 5.4* 3.9 4.2  CL 96*  --  95* 92* 89* 92* 92*  CO2 25  --  27 27 26  26  25  GLUCOSE 128*  --  148* 164* 176* 103* 102*  BUN 43*  --  28* 33* 40* 26* 28*  CREATININE 2.37*  --  1.66* 1.82* 2.05* 1.36* 1.57*  CALCIUM  9.3  --  8.8* 9.0 8.8* 8.6* 8.6*  MG 2.4  --  2.0 2.1 2.4  --  2.2  PHOS 4.4  --  3.4 4.2 4.8*  --  3.9   < > = values in this interval not displayed.   GFR: Estimated Creatinine Clearance: 27.9 mL/min (A) (by C-G formula based on SCr of 1.57 mg/dL (H)). Recent Labs  Lab 02/20/24 0354 02/21/24 0504 02/22/24 0215 02/23/24 0518  WBC 8.2 8.6 8.3 8.1    Liver Function Tests: Recent Labs  Lab 02/19/24 0500 02/20/24 0354 02/21/24 0504 02/22/24 0215 02/23/24 0518  AST 76* 54* 39 40 34  ALT 97* 75* 64* 51* 45*  ALKPHOS 81 79 77 73 75  BILITOT 0.8 0.7 0.8 0.7 0.9  PROT 6.9 6.8 6.7 6.4* 6.5  ALBUMIN 3.4* 3.2*  3.4* 3.2* 3.3*   No results for input(s): LIPASE, AMYLASE in the last 168 hours. No results for input(s): AMMONIA in the last 168 hours.  ABG    Component Value Date/Time   PHART 7.35 02/16/2024 1121   PCO2ART 35 02/16/2024 1121   PO2ART 78 (L) 02/16/2024 1121   HCO3 19.3 (L) 02/16/2024 1121   ACIDBASEDEF 5.6 (H) 02/16/2024 1121   O2SAT 95.5 02/16/2024 1121     Coagulation Profile: No results for input(s): INR, PROTIME in the last 168 hours.   Cardiac Enzymes: No results for input(s): CKTOTAL, CKMB, CKMBINDEX, TROPONINI in the last 168 hours.  HbA1C: Hgb A1c MFr Bld  Date/Time Value Ref Range Status  01/24/2024 10:05 AM 8.0 (H) 4.8 - 5.6 % Final    Comment:    (NOTE) Diagnosis of Diabetes The following HbA1c ranges recommended by the American Diabetes Association (ADA) may be used as an aid in the diagnosis of diabetes mellitus.  Hemoglobin             Suggested A1C NGSP%              Diagnosis  <5.7                   Non Diabetic  5.7-6.4                Pre-Diabetic  >6.4                   Diabetic  <7.0                   Glycemic control for                       adults with diabetes.    06/24/2015 04:42 PM 7.9 (H) 4.0 - 6.0 % Final    CBG: Recent Labs  Lab 02/22/24 0754 02/22/24 1152 02/22/24 1718 02/22/24 1814 02/23/24 0747  GLUCAP 170* 258* 208* 178* 113*    Review of Systems:     Past Medical History  She,  has a past medical history of Allergic genetic state, Atrial fibrillation (HCC) (2010), Cardiomyopathy (HCC), CHF (congestive heart failure) (HCC), Diabetes mellitus without complication (HCC), Dysrhythmia, Gout, H/O cardiac catheterization, Hypertension, Joint pain, Osteopenia, Presence of permanent cardiac pacemaker, and Psoriasis.   Surgical History    Past Surgical History:  Procedure Laterality Date   ABDOMINAL HYSTERECTOMY  1985  BREAST EXCISIONAL BIOPSY Left    negative years ago   BREAST SURGERY Left 1997    papilloma   CARDIAC CATHETERIZATION Left 08/01/2015   Procedure: Left Heart Cath and Coronary Angiography;  Surgeon: Vinie DELENA Jude, MD;  Location: [facility];  Service: Cardiovascular;  Laterality: Left;   CHOLECYSTECTOMY  1986   COLONOSCOPY WITH PROPOFOL  N/A 08/26/2017   Procedure: COLONOSCOPY WITH PROPOFOL ;  Surgeon: Toledo, Ladell POUR, MD;  Location: [facility] END[facility];  Service: Gastroenterology;  Laterality: N/A;   ESOPHAGOGASTRODUODENOSCOPY (EGD) WITH PROPOFOL  N/A 08/26/2017   Procedure: ESOPHAGOGASTRODUODENOSCOPY (EGD) WITH PROPOFOL ;  Surgeon: Toledo, Ladell POUR, MD;  Location: [facility] ENDOSCOPY;  Service: Gastroenterology;  Laterality: N/A;   ESOPHAGOGASTRODUODENOSCOPY (EGD) WITH PROPOFOL  N/A 07/04/2019   Procedure: ESOPHAGOGASTRODUODENOSCOPY (EGD) WITH PROPOFOL ;  Surgeon: Toledo, Ladell POUR, MD;  Location: [facility];  Service: Gastroenterology;  Laterality: N/A;   ESOPHAGOGASTRODUODENOSCOPY (EGD) WITH PROPOFOL  N/A 06/12/2023   Procedure: ESOPHAGOGASTRODUODENOSCOPY (EGD) WITH PROPOFOL ;  Surgeon: Maryruth Ole DASEN, MD;  Location: [facility] ENDOSCOPY;  Service: Endoscopy;  Laterality: N/A;  DM   TUBAL LIGATION  1981     Social History   reports that she has never smoked. She has never used smokeless tobacco. She reports that she does not drink alcohol and does not use drugs.   Family History   Her family history includes Breast cancer (age of onset: 54) in her mother; Cancer (age of onset: 43) in her mother; Coronary artery disease in her father and mother; Diabetes in her mother and son; Stroke in her father.   Allergies Allergies  Allergen Reactions   Doxazosin Other (See Comments)    Cardura - cough   Doxycycline      Abdominal pain   Drug Class [Clindamycin/Lincomycin] Hives   Hydralazine  Hcl Other (See Comments)    gout   Metformin  And Related Swelling   Ciprofloxacin Other (See Comments)    Numbness in face   Iodine Other (See Comments)    NOT CT CONTRAST PER PT    Pioglitazone Other (See Comments)   Sacubitril-Valsartan Cough    Pt states that it gave her a cough and chest palpitations   Semaglutide Nausea Only   Sulfamethoxazole-Trimethoprim Hives   Augmentin [Amoxicillin-Pot Clavulanate] Diarrhea   Dulaglutide Nausea Only   Hctz [Hydrochlorothiazide] Other (See Comments)    Gout   Losartan Diarrhea   Poison Oak Extract Rash   Red Dye #40 (Allura Red) Rash   Home Medications  Prior to Admission medications   Medication Sig Start Date End Date Taking? Authorizing Provider  carvedilol  (COREG ) 12.5 MG tablet Take 1 tablet (12.5 mg total) by mouth 2 (two) times daily with a meal. 01/30/24  Yes Wieting, Richard, MD  gabapentin  (NEURONTIN ) 100 MG capsule Take 100 mg by mouth 2 (two) times daily. 09/28/23  Yes [provider]  insulin  glargine (LANTUS  SOLOSTAR) 100 UNIT/ML Solostar Pen Inject 20 Units into the skin at bedtime. 01/30/24 01/29/25 Yes Wieting, Richard, MD  isosorbide  mononitrate (IMDUR ) 30 MG 24 hr tablet Take 1 tablet (30 mg total) by mouth daily. 01/30/24  Yes Josette Ade, MD  Magnesium  Gluconate 550 MG TABS Take by mouth.   Yes [provider]  RIVAROXABAN  (XARELTO ) VTE STARTER PACK (15 & 20 MG) Follow package directions: Take one 15mg  tablet by mouth twice a day. On day 22, switch to one 20mg  tablet once a day. Take with food. 01/30/24  Yes Wieting, Richard, MD  spironolactone  (ALDACTONE ) 25 MG tablet Take 1/2 tablet (12.5 mg  total) by mouth daily. 01/30/24  Yes Josette Ade, MD  Vitamin D, Ergocalciferol, 2000 units CAPS Take 1 capsule by mouth daily.   Yes [provider]  Scheduled Meds:  vitamin C   500 mg Oral BID   carvedilol   6.25 mg Oral BID WC   Chlorhexidine  Gluconate Cloth  6 each Topical Q0600   feeding supplement (NEPRO CARB STEADY)  237 mL Oral BID BM   furosemide   80 mg Intravenous Q12H   gabapentin   100 mg Oral BID   insulin  aspart  0-6 Units Subcutaneous TID WC   lidocaine   1 patch  Transdermal Q24H   multivitamin  1 tablet Oral QHS   multivitamin with minerals  1 tablet Oral Daily   mouth rinse  15 mL Mouth Rinse 4 times per day   pantoprazole  40 mg Oral Daily   sodium chloride  flush  3 mL Intravenous Q12H   Continuous Infusions:   PRN Meds:.acetaminophen , alteplase , alteplase , alteplase , benzonatate , heparin , heparin , heparin , hydrALAZINE , HYDROcodone  bit-homatropine, hydrocortisone  cream, ipratropium-albuterol , menthol , metoCLOPramide (REGLAN) injection, mouth rinse, oxyCODONE , polyethylene glycol, prochlorperazine , promethazine , sodium chloride    Active Hospital Problem list   See systems below  Assessment & Plan:  #Acute Hypoxic Respiratory Failure secondary to: #Acute Exacerbation of HFrEF #Parainfluenza Virus 3 Infection ~ Resolved #CAP ~ Resolved #Recurrent Right Sided Pleural Effusion  02/22/24 CXR: Worsening right mid and lower lung airspace disease; re-accumulation of moderate right pleural effusion; cardiomegaly with mild interstitial edema 02/11/24 Thoracentesis: concerning for underlying lymphoproliferative disease  Hx: Intubated 11/18 s/p extubation 11/20 - Supplemental O2 as needed to maintain sats 88-92% - Placed on BiPAP once arrived to ICU - High risk for intubation - Intermittent Chest X-ray & ABG as needed - Bronchodilators and Pulmicort nebs - Incentive spirometry - IR consulted for repeat thoracentesis s/t recurrent pleural effusion   #Acute on Chronic HFrEF, NYHA II #Cardiac arrest -Brief PEA arrests on 11/19 ~ Resolved #Elevated Troponin likely demand iso above ~ Resolved #PAF / recent PE #Hypertension ECHO (11/19): LVEF 30-35%, global hypokinesis, moderately reduced RV function with mild enlargement and mild RV thickness.  - Continuous cardiac monitoring - Vasopressors to maintain MAP goal >65 ~ not currently requiring - Lasix  80mg  Q12h - Hold anticoagulation for now (Eliquis  5mg  twice daily) - Continue Carvedilol  -  Hydralazine  10mg  Q4h prn for SBP >165 - EKG prn - Cardiology following; appreciate input  #AKI on CKD stage III - Volume Overload #Hyponatremia #Hyperkalemia Renal US : negative for hydronephrosis - Strict I&O - Trend BMP, Mag and Phosphorous - Creatinine worsening at 2.05 with a BUN of 40 - Ensure adequate renal perfusion - Avoid nephrotoxic agents as able - ICU electrolyte replacement protocol - Pharmacy to assist with replacement as indicated - Received Lokelma  x 1 - Nephrology following; will receive HD tonight - Vascular will take her to the vascular lab tomorrow for PermaCath placement  #Nausea and Vomiting - Monitor hepatic function - Diet: NPO - Constipation protocol prn - Reglan 5mg  IV Q8h prn - Compazine  5mg  IV Q4h prn - Phenergan  25mg  Q6h prn  #Type II Diabetes Mellitus Recent (HgbA1c 8.0%) - ICU hypo/hyperglycemia protocol  - SSI - CBG Q4h - Target CBG readings 140 to 180  #CAP ~ Resolved #Parainfluenza Virus 3 ~ Resolved - Monitor WBC and fever curve - Remains without leukocytosis or fever    Best practice:  Diet:  Oral Pain/Anxiety/Delirium protocol (if indicated): No VAP protocol (if indicated): Not indicated DVT prophylaxis: N/A GI prophylaxis: N/A Glucose control:  SSI Yes and Basal insulin  Yes Central venous access:  N/A Arterial line:  N/A Foley:  N/A Mobility:  bed rest  PT/OT consulted: Yes Code Status:  full code Disposition: Stepdown    Critical care provider statement:   Total critical care time: 33 minutes   Performed by: Parris MD   Critical care time was exclusive of separately billable procedures and treating other patients.   Critical care was necessary to treat or prevent imminent or life-threatening deterioration.   Critical care was time spent personally by me on the following activities: development of treatment plan with patient and/or surrogate as well as nursing, discussions with consultants, evaluation of  patient's response to treatment, examination of patient, obtaining history from patient or surrogate, ordering and performing treatments and interventions, ordering and review of laboratory studies, ordering and review of radiographic studies, pulse oximetry and re-evaluation of patient's condition.    Bhargav Barbaro, M.D.  Pulmonary & Critical Care Medicine

## 2024-02-23 NOTE — Progress Notes (Signed)
 [facility] CLINIC CARDIOLOGY PROGRESS NOTE       Patient ID: Deborah Dawson MRN: [id number] DOB/AGE: 75-May-1952 31 y.o.  Admit date: 02/09/2024 Referring Physician Inge Lecher, NP Primary Physician Sadie Manna, MD  Primary Cardiologist Dr. Ammon Reason for Consultation post-PEA arrest  HPI: Deborah Dawson is a 73 y.o. female  with a past medical history of chronic HFrEF, s/p CRT-P 07/2021, persistent atrial fibrillation, hypertension, type 2 diabetes who presented to the ED on 02/09/2024 for shortness of breath. CTA chest concerning for PNA. Overnight had PEA arrest x2 with ROSC. Cardiology was consulted for further evaluation.   Interval history: - Patient seen and examined this morning, resting in bed. - On 2L [place]. SOB better after thoracentesis today. - She denies any chest pain, palpitations. Reports some nausea this afternoon. - No significant LE edema on exam.   Review of systems complete and found to be negative unless listed above    Past Medical History:  Diagnosis Date   Allergic genetic state    Atrial fibrillation (HCC) 2010   Cardiomyopathy (HCC)    CHF (congestive heart failure) (HCC)    Diabetes mellitus without complication (HCC)    Dysrhythmia    Gout    H/O cardiac catheterization    Hypertension    Joint pain    Osteopenia    Presence of permanent cardiac pacemaker    Psoriasis     Past Surgical History:  Procedure Laterality Date   ABDOMINAL HYSTERECTOMY  1985   BREAST EXCISIONAL BIOPSY Left    negative years ago   BREAST SURGERY Left 1997   papilloma   CARDIAC CATHETERIZATION Left 08/01/2015   Procedure: Left Heart Cath and Coronary Angiography;  Surgeon: Vinie DELENA Jude, MD;  Location: [facility] INVASIVE CV LAB;  Service: Cardiovascular;  Laterality: Left;   CHOLECYSTECTOMY  1986   COLONOSCOPY WITH PROPOFOL  N/A 08/26/2017   Procedure: COLONOSCOPY WITH PROPOFOL ;  Surgeon: Toledo, Ladell POUR, MD;  Location: [facility] ENDOSCOPY;  Service: Gastroenterology;   Laterality: N/A;   ESOPHAGOGASTRODUODENOSCOPY (EGD) WITH PROPOFOL  N/A 08/26/2017   Procedure: ESOPHAGOGASTRODUODENOSCOPY (EGD) WITH PROPOFOL ;  Surgeon: Toledo, Ladell POUR, MD;  Location: [facility] ENDOSCOPY;  Service: Gastroenterology;  Laterality: N/A;   ESOPHAGOGASTRODUODENOSCOPY (EGD) WITH PROPOFOL  N/A 07/04/2019   Procedure: ESOPHAGOGASTRODUODENOSCOPY (EGD) WITH PROPOFOL ;  Surgeon: Toledo, Ladell POUR, MD;  Location: [facility] ENDOSCOPY;  Service: Gastroenterology;  Laterality: N/A;   ESOPHAGOGASTRODUODENOSCOPY (EGD) WITH PROPOFOL  N/A 06/12/2023   Procedure: ESOPHAGOGASTRODUODENOSCOPY (EGD) WITH PROPOFOL ;  Surgeon: Maryruth Ole DASEN, MD;  Location: [facility] ENDOSCOPY;  Service: Endoscopy;  Laterality: N/A;  DM   TUBAL LIGATION  1981    Medications Prior to Admission  Medication Sig Dispense Refill Last Dose/Taking   carvedilol  (COREG ) 12.5 MG tablet Take 1 tablet (12.5 mg total) by mouth 2 (two) times daily with a meal. 60 tablet 0 02/09/2024 Morning   gabapentin  (NEURONTIN ) 100 MG capsule Take 100 mg by mouth 2 (two) times daily.   02/09/2024 Morning   insulin  glargine (LANTUS  SOLOSTAR) 100 UNIT/ML Solostar Pen Inject 20 Units into the skin at bedtime.   02/08/2024   isosorbide  mononitrate (IMDUR ) 30 MG 24 hr tablet Take 1 tablet (30 mg total) by mouth daily. 30 tablet 0 02/09/2024   Magnesium  Gluconate 550 MG TABS Take by mouth.   Taking   RIVAROXABAN  (XARELTO ) VTE STARTER PACK (15 & 20 MG) Follow package directions: Take one 15mg  tablet by mouth twice a day. On day 22, switch to one 20mg  tablet once a day. Take  with food. 51 each 0 02/09/2024 Morning   spironolactone  (ALDACTONE ) 25 MG tablet Take 1/2 tablet (12.5 mg total) by mouth daily. 30 tablet 0 02/09/2024   Vitamin D, Ergocalciferol, 2000 units CAPS Take 1 capsule by mouth daily.   Taking   Social History   Socioeconomic History   Marital status: Married    Spouse name: Not on file   Number of children: Not on file   Years of education: Not on  file   Highest education level: Not on file  Occupational History   Not on file  Tobacco Use   Smoking status: Never   Smokeless tobacco: Never  Vaping Use   Vaping status: Never Used  Substance and Sexual Activity   Alcohol use: No    Alcohol/week: 0.0 standard drinks of alcohol   Drug use: No   Sexual activity: Not on file    Comment: Married   Other Topics Concern   Not on file  Social History Narrative   Not on file   Social Drivers of Health   Financial Resource Strain: Low Risk  (11/02/2023)   Received from [facility]   Overall Financial Resource Strain (CARDIA)    Difficulty of Paying Living Expenses: Not hard at all  Food Insecurity: Patient Unable To Answer (02/10/2024)   Hunger Vital Sign    Worried About Running Out of Food in the Last Year: Patient unable to answer    Ran Out of Food in the Last Year: Patient unable to answer  Transportation Needs: Patient Unable To Answer (02/10/2024)   PRAPARE - Transportation    Lack of Transportation (Medical): Patient unable to answer    Lack of Transportation (Non-Medical): Patient unable to answer  Physical Activity: Not on file  Stress: Not on file  Social Connections: Patient Unable To Answer (02/10/2024)   Social Connection and Isolation Panel    Frequency of Communication with Friends and Family: Patient unable to answer    Frequency of Social Gatherings with Friends and Family: Patient unable to answer    Attends Religious Services: Patient unable to answer    Active Member of Clubs or Organizations: Patient unable to answer    Attends Banker Meetings: Patient unable to answer    Marital Status: Patient unable to answer  Intimate Partner Violence: Patient Unable To Answer (02/10/2024)   Humiliation, Afraid, Rape, and Kick questionnaire    Fear of Current or Ex-Partner: Patient unable to answer    Emotionally Abused: Patient unable to answer    Physically Abused: Patient unable to  answer    Sexually Abused: Patient unable to answer    Family History  Problem Relation Age of Onset   Cancer Mother 6       breast   Diabetes Mother    Breast cancer Mother 72   Coronary artery disease Mother    Stroke Father    Coronary artery disease Father    Diabetes Son      Vitals:   02/23/24 0400 02/23/24 0500 02/23/24 0600 02/23/24 0700  BP: 139/85 131/86 136/84 137/78  Pulse: 71 72 78 80  Resp: (!) 25 16 19 18   Temp:      TempSrc:      SpO2: 99% 100% 99% 98%  Weight:  71 kg    Height:        PHYSICAL EXAM General: Chronically ill-appearing elderly female.  No acute distress. HEENT: Normocephalic and atraumatic. Neck: No JVD.  Lungs: Normal  respiratory effort on 2 L [place]. Mild bibasilar crackles. Heart: HRRR. Normal S1 and S2 without gallops or murmurs.  Abdomen: Non-distended appearing.  Msk: Normal strength and tone for age. Extremities: Warm and well perfused. No clubbing, cyanosis.  No edema.  Neuro: Alert and oriented X 3. Psych: Answers questions appropriately.   Labs: Basic Metabolic Panel: Recent Labs    02/22/24 0215 02/23/24 0000 02/23/24 0518  NA 125* 128* 128*  K 5.4* 3.9 4.2  CL 89* 92* 92*  CO2 26 26 25   GLUCOSE 176* 103* 102*  BUN 40* 26* 28*  CREATININE 2.05* 1.36* 1.57*  CALCIUM  8.8* 8.6* 8.6*  MG 2.4  --  2.2  PHOS 4.8*  --  3.9   Liver Function Tests: Recent Labs    02/22/24 0215 02/23/24 0518  AST 40 34  ALT 51* 45*  ALKPHOS 73 75  BILITOT 0.7 0.9  PROT 6.4* 6.5  ALBUMIN 3.2* 3.3*    No results for input(s): LIPASE, AMYLASE in the last 72 hours. CBC: Recent Labs    02/22/24 0215 02/23/24 0518  WBC 8.3 8.1  NEUTROABS 5.6 5.5  HGB 13.2 13.0  HCT 40.4 39.2  MCV 88.6 87.5  PLT 180 178   Cardiac Enzymes: No results for input(s): CKTOTAL, CKMB, CKMBINDEX, TROPONINIHS in the last 72 hours. BNP: No results for input(s): BNP in the last 72 hours. D-Dimer: No results for input(s): DDIMER in the  last 72 hours. Hemoglobin A1C: No results for input(s): HGBA1C in the last 72 hours. Fasting Lipid Panel: No results for input(s): CHOL, HDL, LDLCALC, TRIG, CHOLHDL, LDLDIRECT in the last 72 hours. Thyroid Function Tests: No results for input(s): TSH, T4TOTAL, T3FREE, THYROIDAB in the last 72 hours.  Invalid input(s): FREET3 Anemia Panel: No results for input(s): VITAMINB12, FOLATE, FERRITIN, TIBC, IRON, RETICCTPCT in the last 72 hours.   Radiology: DG Abd 1 View Result Date: 02/10/2024 EXAM: 1 VIEW XRAY OF THE ABDOMEN 02/10/2024 04:44:00 AM COMPARISON: 02/10/2024 CLINICAL HISTORY: Encounter for imaging study to confirm orogastric (OG) tube placement FINDINGS: LINES, TUBES AND DEVICES: Gastric tube in place with side hole at the level of the gastric body. Cardiac pacemaker noted. BOWEL: Nonobstructive bowel gas pattern. SOFT TISSUES: Surgical clips in the right upper quadrant. No opaque urinary calculi. BONES: No acute osseous abnormality. LUNG BASES: Interstitial and patchy airspace opacities in the partially visualized lung bases, probably atelectasis. IMPRESSION: 1. Satisfactory enteric tube placement into the stomach. Electronically signed by: Helayne Hurst MD 02/10/2024 05:25 AM EST RP Workstation: [id number]   DG Abd 1 View Result Date: 02/10/2024 EXAM: 1 VIEW XRAY OF THE ABDOMEN 02/10/2024 04:05:00 AM COMPARISON: None available. CLINICAL HISTORY: Encounter for imaging study to confirm orogastric (OG) tube placement. FINDINGS: LINES, TUBES AND DEVICES: Enteric tube in place with tip and side port overlying the expected region of the gastric lumen. Cardiac pacemaker noted. BOWEL: Nonobstructive bowel gas pattern. SOFT TISSUES: No opaque urinary calculi. BONES: No acute osseous abnormality. LUNG BASES: Patchy airspace opacities in the partially visualized lung bases. IMPRESSION: 1. Enteric tube in appropriate position with tip and side port overlying the  expected region of the gastric lumen. 2. Patchy airspace opacities in the partially visualized lung bases. Electronically signed by: Dorethia Molt MD 02/10/2024 04:25 AM EST RP Workstation: [id number]   CT CHEST WO CONTRAST Result Date: 02/10/2024 EXAM: CT CHEST WITHOUT CONTRAST 02/10/2024 01:28:16 AM TECHNIQUE: CT of the chest was performed without the administration of intravenous contrast. Multiplanar reformatted images are provided for review.  Automated exposure control, iterative reconstruction, and/or weight based adjustment of the mA/kV was utilized to reduce the radiation dose to as low as reasonably achievable. COMPARISON: Vertebrae 18 to 2025. CLINICAL HISTORY: CXR post cardiac arrest: predominantly within the right upper lobe, possibly representing acute pneumonic consolidation, pulmonary hemorrhage, or asymmetric alveolar pulmonary edema as could be seen with acute mitral valve regurgitation. FINDINGS: MEDIASTINUM: Heart: Mild coronary artery calcification. Mild cardiomegaly. A left subclavian dual-lead pacemaker is identified with leads within the left ventricular venous outflow and right ventricle toward the apex. No pericardial effusion. Vessels: The central pulmonary arteries are enlarged in keeping with changes of pulmonary arterial hypertension. Mild atherosclerotic calcification within the thoracic aorta. No aortic aneurysm. Airways: The central airways are clear. Interval endotracheal tube placement attempts 2.6 cm above the carina. Other: Interval nasogastric tube placement extending into the stomach beyond the margin of the exam. Visualized thyroid is unremarkable. LYMPH NODES: Extensive mediastinal adenopathy is again identified and appears stable, nonspecific. This may be reactive, inflammatory as can be seen with conditions such as sarcoidosis, or lymphoproliferative in etiology. Comparison with interval examinations, if available, would be helpful in determining stability. If none are  available, follow-up CT or PET CT examination would be helpful in 3 months to demonstrate stability or resolution. LUNGS AND PLEURA: Extensive asymmetric pulmonary ground-glass infiltrate, multiple consolidation, and smooth interlobular septal thickening, asymmetrically more severe within the right upper lobe. Given its relatively rapid development since prior examination, the findings are most suggestive of asymmetric pulmonary edema, though pulmonary hemorrhage could appear similarly. Extensive pneumonic infiltrate or changes of ARDS are considered less likely given the relatively rapid development. Moderate to large right pleural effusion again noted with compressive atelectasis of the right lower lobe. No pneumothorax. SOFT TISSUES/BONES: Acute fracture of the left seventh rib anterolaterally with minimal displacement. No other acute bone abnormality identified. Osseous structures are otherwise age appropriate. UPPER ABDOMEN: Limited images of the upper abdomen demonstrates no acute abnormality. IMPRESSION: 1. Extensive asymmetric pulmonary ground-glass infiltrate, multifocal consolidation, and smooth interlobular septal thickening, greatest in the right upper lobe, most suggestive of asymmetric pulmonary edema given rapid interval development; acute pulmonary hemorrhage is a differential consideration. Extensive pneumonic infiltrate or ARDS considered less likely. 2. Moderate to large right pleural effusion with compressive atelectasis of the right lower lobe. 3. Acute minimally displaced fracture of the left anterolateral seventh rib. 4. Thoracic adenopathy as outlined above. This is nonspecific, but may be reactive, inflammatory as can be seen with conditions such as sarcoidosis, or lymphoproliferative in etiology. Comparison with interval examinations, if available, be helpful in determining stability. If none are available, follow-up CT or PET CT examination would be helpful in 3 months to demonstrate  stability or resolution. 5. Findings consistent with pulmonary arterial hypertension (enlarged central pulmonary arteries). 6. Mild coronary artery calcification and mild cardiomegaly. Electronically signed by: Dorethia Molt MD 02/10/2024 01:46 AM EST RP Workstation: [id number]   CT HEAD WO CONTRAST ( ) Result Date: 02/10/2024 EXAM: CT HEAD WITHOUT CONTRAST 02/10/2024 01:28:16 AM TECHNIQUE: CT of the head was performed without the administration of intravenous contrast. Automated exposure control, iterative reconstruction, and/or weight based adjustment of the mA/kV was utilized to reduce the radiation dose to as low as reasonably achievable. COMPARISON: None available. CLINICAL HISTORY: Mental status change, unknown cause. FINDINGS: BRAIN AND VENTRICLES: Cerebral ventricle sizes are concordant with the degree of cerebral volume loss.No acute hemorrhage. No evidence of acute infarct. No hydrocephalus. No extra-axial collection. No mass effect or midline shift.  Residual contrast material within intracranial vasculature from recent contrast administration. ORBITS: Bilateral lens replacement. SINUSES: No acute abnormality. SOFT TISSUES AND SKULL: Partially imaged endotracheal tube in place. No acute soft tissue abnormality. No skull fracture. IMPRESSION: 1. No acute intracranial abnormality. Electronically signed by: Morgane Naveau MD 02/10/2024 01:40 AM EST RP Workstation: [id number]   DG Chest Portable 1 View Result Date: 02/10/2024 EXAM: 1 VIEW(S) XRAY OF THE CHEST 02/10/2024 12:12:00 AM COMPARISON: 02/09/2024 CLINICAL HISTORY: post intubation FINDINGS: LINES, TUBES AND DEVICES: Endotracheal tube in place with tip 2.2 cm above the carina. Enteric tube in place, courses below the diaphragm. LUNGS AND PLEURA: Interval development of extensive airspace infiltrate within the right lung, predominantly within the right upper lobe. This may represent acute pneumonic consolidation, pulmonary hemorrhage or  asymmetric alveolar pulmonary edema as could be seen with acute mitral valve regurgitation. Known right pleural effusion seen on CT examination of 02/09/2024 is not well appreciated on this supine radiograph. No definite pneumothorax. HEART AND MEDIASTINUM: Similar mild cardiomegaly. Left chest cardiac pacemaker noted. Aortic arch atherosclerosis. BONES AND SOFT TISSUES: No acute osseous abnormality. IMPRESSION: 1. Interval development of extensive airspace infiltrate within the right lung, predominantly within the right upper lobe, possibly representing acute pneumonic consolidation, pulmonary hemorrhage, or asymmetric alveolar pulmonary edema as could be seen with acute mitral valve regurgitation. 2. Known right pleural effusion seen on CT examination of 02/09/2024 is not well appreciated on this supine radiograph. No definite pneumothorax. Electronically signed by: Dorethia Molt MD 02/10/2024 12:22 AM EST RP Workstation: [id number]   CT Angio Chest PE W and/or Wo Contrast Result Date: 02/09/2024 EXAM: CTA CHEST 02/09/2024 06:29:20 PM TECHNIQUE: CTA of the chest was performed without and with the administration of 75 mL of iohexol  (OMNIPAQUE ) 350 MG/ML injection. Multiplanar reformatted images are provided for review. MIP images are provided for review. Automated exposure control, iterative reconstruction, and/or weight based adjustment of the mA/kV was utilized to reduce the radiation dose to as low as reasonably achievable. COMPARISON: None available. CLINICAL HISTORY: Pulmonary embolism (PE) suspected, low to intermediate prob, positive D-dimer. FINDINGS: PULMONARY ARTERIES: Pulmonary arteries are adequately opacified for evaluation. No acute pulmonary embolus. Main pulmonary artery is normal in caliber. MEDIASTINUM: The heart and pericardium demonstrate no acute abnormality. There is no acute abnormality of the thoracic aorta. LYMPH NODES: Right hilar lymphadenopathy with, as an example, a 1.2 cm lymph  node (5:54). Left hilar lymphadenopathy with, as an example, a 1.2 cm lymph node (5 x 15 x 4 mm). Mediastinal lymphadenopathy with a right paratracheal 1.1 cm lymph node (5.32). No axillary lymphadenopathy. LUNGS AND PLEURA: At least a small volume right pleural effusion. No left pleural effusion. No pneumothorax. Basal atelectasis of the right lower lobe. Diffuse bronchial wall thickening with mucous plugging within the right lower lobe. Mucous plugging within the left lower lobe to a lesser extent. Question of developing small consolidation within the right lower lobe (6:88) with associated bronchial plugging. The trachea and mainstem bronchi are patent. Expiratory phase respiration. UPPER ABDOMEN: Limited images of the upper abdomen are unremarkable. SOFT TISSUES AND BONES: Left chest wall 2 lead cardiac device. No acute bone or soft tissue abnormality. IMPRESSION: 1. No pulmonary embolism. 2. Diffuse bronchial wall thickening with mucous plugging, greater in the right lower lobe, with probable developing small right lower lobe consolidation. 3. Small right pleural effusion. 4. Right hilar, left hilar, and mediastinal lymphadenopathy. Likely reactive in etiology. Recommend attention on follow-up. Electronically signed by: Morgane Naveau MD 02/09/2024 07:12  PM EST RP Workstation: [id number]   DG Chest 2 View Result Date: 02/09/2024 CLINICAL DATA:  Shortness of breath and pleural effusion. EXAM: CHEST - 2 VIEW COMPARISON:  Chest x-ray 01/29/2024.  Chest CT 01/24/2024. FINDINGS: There is a small right pleural effusion. Left lung is clear. No pneumothorax or focal lung infiltrate. The cardiac silhouette is enlarged. Left-sided pacemaker is again seen. No acute fractures are identified. IMPRESSION: 1. Small right pleural effusion. 2. Cardiomegaly. Electronically Signed   By: Greig Pique M.D.   On: 02/09/2024 15:56   US  THORACENTESIS ASP PLEURAL SPACE W/IMG GUIDE Result Date: 01/29/2024 INDICATION: 73 year old  female presents with shortness of breath, cough, and recurrent right pleural effusion. Received request for diagnostic and therapeutic thoracentesis. EXAM: ULTRASOUND GUIDED RIGHT THORACENTESIS MEDICATIONS: 10 mL 1% lidocaine  COMPLICATIONS: None immediate. PROCEDURE: An ultrasound guided thoracentesis was thoroughly discussed with the patient and questions answered. The benefits, risks, alternatives and complications were also discussed. The patient understands and wishes to proceed with the procedure. Written consent was obtained. Ultrasound was performed to localize and mark an adequate pocket of fluid in the right chest. The area was then prepped and draped in the normal sterile fashion. 1% lidocaine  was used for local anesthesia. Under ultrasound guidance a 6 Fr Safe-T-Centesis catheter was introduced. Thoracentesis was performed. The catheter was removed and a dressing applied. FINDINGS: A total of approximately 700 mL of amber fluid was removed. Samples were sent to the laboratory as requested by the clinical team. IMPRESSION: Successful ultrasound guided right thoracentesis yielding 700 mL of pleural fluid. Performed by: Kristi Davenport, NP Electronically Signed   By: Cordella Banner   On: 01/29/2024 15:39   DG Chest Port 1 View Result Date: 01/29/2024 EXAM: 1 VIEW(S) XRAY OF THE CHEST 01/29/2024 11:40:00 AM COMPARISON: 01/29/2024 CLINICAL HISTORY: Recurrent right pleural effusion FINDINGS: LINES, TUBES AND DEVICES: Left chest pacemaker with leads terminating in the right atrium and coronary sinus. LUNGS AND PLEURA: No focal pulmonary opacity. No pulmonary edema. Near complete resolution of the right pleural effusion with minimal blunting of the right costophrenic angle. Improved aeration of right lung base. No pneumothorax. HEART AND MEDIASTINUM: Cardiomegaly. BONES AND SOFT TISSUES: No acute osseous abnormality. Cholecystectomy clips. IMPRESSION: 1. Near complete resolution of the right pleural  effusion with minimal residual blunting of the right costophrenic angle and improved aeration of the right lung base. Electronically signed by: Rogelia Myers MD 01/29/2024 12:53 PM EST RP Workstation: [id number]   DG Chest 2 View Result Date: 01/29/2024 EXAM: 2 VIEW(S) XRAY OF THE CHEST 01/29/2024 06:46:00 AM COMPARISON: 01/26/2024 CLINICAL HISTORY: Pleural effusion on right. FINDINGS: LINES, TUBES AND DEVICES: Stable 2 lead left chest pacemaker. LUNGS AND PLEURA: Increased right base airspace disease. Small right pleural effusion, mildly increased. No pulmonary edema. No pneumothorax. HEART AND MEDIASTINUM: No acute abnormality of the cardiac and mediastinal silhouettes. BONES AND SOFT TISSUES: Upper abdominal surgical clips noted. No acute osseous abnormality. IMPRESSION: 1. Redeveloping small right pleural effusion with increased right base airspace disease, most likely atelectasis. 2. No pneumothorax. Electronically signed by: Rockey Kilts MD 01/29/2024 11:11 AM EST RP Workstation: [id number]   US  THORACENTESIS ASP PLEURAL SPACE W/IMG GUIDE Result Date: 01/26/2024 INDICATION: 73 year old female presents with shortness of breath. Received request for diagnostic and therapeutic thoracentesis. EXAM: ULTRASOUND GUIDED RIGHT THORACENTESIS MEDICATIONS: 8 mL 1% lidocaine  COMPLICATIONS: None immediate. PROCEDURE: An ultrasound guided thoracentesis was thoroughly discussed with the patient and questions answered. The benefits, risks, alternatives and complications were  also discussed. The patient understands and wishes to proceed with the procedure. Written consent was obtained. Ultrasound was performed to localize and mark an adequate pocket of fluid in the right chest. The area was then prepped and draped in the normal sterile fashion. 1% lidocaine  was used for local anesthesia. Under ultrasound guidance a 6 Fr Safe-T-Centesis catheter was introduced. Thoracentesis was performed. The catheter was removed and a  dressing applied. FINDINGS: A total of approximately 900 mL of clear yellow fluid was removed. Samples were sent to the laboratory as requested by the clinical team. IMPRESSION: Successful ultrasound guided right thoracentesis yielding 900 mL of pleural fluid. Performed by: Rayfield Buff, NP Electronically Signed   By: Cordella Banner   On: 01/26/2024 14:49   DG Chest Port 1 View Result Date: 01/26/2024 EXAM: 1 VIEW(S) XRAY OF THE CHEST 01/26/2024 01:02:00 PM COMPARISON: 01/25/2024 CLINICAL HISTORY: SOB (shortness of breath); Status post thoracentesis FINDINGS: LUNGS AND PLEURA: Evacuation of right pleural effusion with a small amount of residual fluid. Minimal overlying atelectasis. The left lung remains clear No post-procedural pneumothorax. No pulmonary edema. No pneumothorax. HEART AND MEDIASTINUM: Cardiomegaly with left subclavian approach cardiac rhythm maintenance device. Aortic arch atherosclerosis. BONES AND SOFT TISSUES: No acute osseous abnormality. IMPRESSION: 1. Evacuation of right pleural effusion with a small residual effusion and minimal overlying atelectasis. No pneumothorax. 2. No post-procedure or pneumothorax. Electronically signed by: Maude Stammer MD 01/26/2024 01:38 PM EST RP Workstation: [id number]   DG Chest Port 1 View Result Date: 01/25/2024 EXAM: 1 VIEW(S) XRAY OF THE CHEST 01/25/2024 09:41:07 AM COMPARISON: 01/24/2024 CLINICAL HISTORY: Shortness of breath FINDINGS: LUNGS AND PLEURA: Small right pleural effusion. Increased right lower and developing inferior right upper lobe airspace disease. Diffuse interstitial thickening. No pneumothorax. HEART AND MEDIASTINUM: Left subclavian dual lead pacemaker unchanged. Cardiomegaly. BONES AND SOFT TISSUES: No acute osseous abnormality. IMPRESSION: 1. Persistent small right pleural effusion with increased right lower and developing inferior right upper airspace disease, favoring pneumonia 2. Cardiomegaly with increased interstitial  thickening, favoring mild pulmonary edema Electronically signed by: Rockey Kilts MD 01/25/2024 05:05 PM EST RP Workstation: [id number]   US  Venous Img Lower Bilateral (DVT) Result Date: 01/24/2024 CLINICAL DATA:  Pulmonary embolism. EXAM: BILATERAL LOWER EXTREMITY VENOUS DOPPLER ULTRASOUND TECHNIQUE: Gray-scale sonography with compression, as well as color and duplex ultrasound, were performed to evaluate the deep venous system(s) from the level of the common femoral vein through the popliteal and proximal calf veins. COMPARISON:  None Available. FINDINGS: VENOUS Normal compressibility of the common femoral, superficial femoral, and popliteal veins, as well as the visualized calf veins. Visualized portions of profunda femoral vein and great saphenous vein unremarkable. No filling defects to suggest DVT on grayscale or color Doppler imaging. Doppler waveforms show normal direction of venous flow, normal respiratory plasticity and response to augmentation. Limited views of the contralateral common femoral vein are unremarkable. OTHER None. Limitations: none IMPRESSION: Negative. Electronically Signed   By: Leita Birmingham M.D.   On: 01/24/2024 14:19   CT Angio Chest PE W and/or Wo Contrast Addendum Date: 01/24/2024 ** ADDENDUM #1 ** ADDENDUM: The above findings were discussed with Dr. Levander at 9:53 am 01/24/24. ---------------------------------------------------- Electronically signed by: Evalene Coho MD 01/24/2024 10:49 AM EST RP Workstation: [id number]   Result Date: 01/24/2024 ** ORIGINAL REPORT ** EXAM: CTA of the Chest with contrast for PE 01/24/2024 09:25:18 AM TECHNIQUE: CTA of the chest was performed without and with the administration of 75 mL of iohexol  (OMNIPAQUE ) 350 MG/ML injection. Multiplanar  reformatted images are provided for review. MIP images are provided for review. Automated exposure control, iterative reconstruction, and/or weight based adjustment of the mA/kV was utilized to reduce the  radiation dose to as low as reasonably achievable. COMPARISON: PA and lateral radiographs of the chest dated 01/24/2024. CLINICAL HISTORY: Pulmonary embolism (PE) suspected, low to intermediate prob, positive D-dimer. FINDINGS: PULMONARY ARTERIES: Pulmonary arteries are adequately opacified for evaluation. There is nonocclusive thromboembolic disease present within the distal right pulmonary artery and within the right lower lobe artery and anterior segmental branch of the right lower lobe pulmonary artery. There is also thromboembolus within the posterior segmental artery. There is no definite thromboembolic disease demonstrated in the left lung. Main pulmonary artery is normal in caliber. MEDIASTINUM: The heart is enlarged. There is no evidence of right ventricular strain. There is mild calcific coronary artery disease. The thoracic aorta demonstrates mild-to-moderate calcific atheromatous disease. The ascending thoracic aorta measures approximately 3.3 cm in diameter. LYMPH NODES: There are numerous enlarged mediastinal lymph nodes. There is also right hilar lymphadenopathy. No axillary lymphadenopathy. LUNGS AND PLEURA: There is mild dependent atelectasis within the right lung. There is a moderate right-sided pleural effusion. No pneumothorax. No focal consolidation or pulmonary edema. UPPER ABDOMEN: Limited images of the upper abdomen are unremarkable. SOFT TISSUES AND BONES: No acute bone or soft tissue abnormality. IMPRESSION: 1. Nonocclusive pulmonary emboli involving the distal right pulmonary artery, right lower lobe artery, and anterior and posterior segmental branches; no left-sided PE identified. No CT evidence of right heart strain. 2. Moderate right pleural effusion. 3. Cardiomegaly with mild coronary artery calcifications and mild-to-moderate thoracic aortic atherosclerosis. 4. Enlarged mediastinal and right hilar lymph nodes; recommend clinical correlation and follow-up per  oncologic/infectious/inflammatory considerations. Electronically signed by: Evalene Coho MD 01/24/2024 09:51 AM EST RP Workstation: [id number]   DG Chest 2 View Result Date: 01/24/2024 CLINICAL DATA:  Shortness of breath. EXAM: CHEST - 2 VIEW COMPARISON:  08/08/2020 FINDINGS: Mild cardiomegaly, with dual lead pacemaker in place. Small to moderate right pleural effusion. Right basilar atelectasis versus infiltrate. IMPRESSION: Small to moderate right pleural effusion, with right basilar atelectasis versus infiltrate. Electronically Signed   By: Norleen DELENA Kil M.D.   On: 01/24/2024 08:29    ECHO 02/10/24: 1. TDS.  2. Left ventricular ejection fraction, by estimation, is 30 to 35%. The left ventricle has moderately decreased function. The left ventricle demonstrates global hypokinesis. Left ventricular diastolic function could  not be evaluated.  3. Right ventricular systolic function is moderately reduced. The right  ventricular size is mildly enlarged. Mildly increased right ventricular  wall thickness.  4. Left atrial size was mildly dilated.  5. Right atrial size was mildly dilated.  6. The mitral valve is normal in structure. No evidence of mitral valve  regurgitation.  7. The aortic valve was not well visualized. Aortic valve regurgitation  is not visualized.   TELEMETRY (personally reviewed): VP rate 90-100s, PVCs  EKG (personally reviewed): VP rate 69 bpm  Data reviewed by me 02/23/2024: last 24h vitals tele labs imaging I/O ED provider note, admission H&P, hospitalist progress note, PCCM notes, nephrology notes, vascular notes  Principal Problem:   CAP (community acquired pneumonia) Active Problems:   Encounter for dialysis and dialysis catheter care   Acute renal failure   Cardiac arrest (HCC)   Pressure injury of skin    ASSESSMENT AND PLAN:  Deborah Dawson is a 73 y.o. female  with a past medical history of chronic HFrEF, s/p CRT-P 07/2021,  persistent atrial fibrillation,  hypertension, type 2 diabetes who presented to the ED on 02/09/2024 for shortness of breath. CTA chest concerning for PNA. Overnight had PEA arrest x2 with ROSC. Cardiology was consulted for further evaluation.   # PEA arrest # Pneumonia # Acute on chronic HFrEF # S/p CRT-P 07/2021 # Persistent atrial fibrillation Patient initially presented with shortness of breath, concern for pneumonia on CTA chest.  Overnight last night she had PEA arrest with ROSC then shortly afterwards arrested again.  Intubated in the ED.  Troponins mildly elevated and flat trending.  BNP significantly elevated on admission at 15,900. EKG without acute ischemic changes.  Echo this admission with EF 30-35%, global hypokinesis, moderately reduced RV function with mild enlargement and mild RV thicknes. S/p thoracentesis 11/26 (1.2L removed), 12/2 (1.5L removed) -Nephro managing need for HD. Appreciate their recommendations. Going for permcath placement today. -IV lasix  80 mg BID ordered but with minimal UOP. -Continue carvedilol  6.25 mg twice daily. Consider reintroducing other GDMT as able. -Eliquis  currently held. -Mildly elevated and flat troponins most consistent with demand/supply mismatch and not ACS.  -Further management per PCCM.   This patient's plan of care was discussed and created with Dr. Florencio and she is in agreement.  Signed: Danita Bloch, PA-C  02/23/2024, 1:55 PM [facility] Card[facility]

## 2024-02-23 NOTE — Progress Notes (Signed)
 PROGRESS NOTE    Deborah Dawson  FMW:[id number] DOB: 1950/09/27 DOA: 02/09/2024 PCP: Sadie Manna, MD  Chief Complaint  Patient presents with   Shortness of Breath    Hospital Course:  Deborah Dawson 73 year old female with extensive past medical history including paroxysmal A-fib, prior pulmonary embolism, chronic heart failure with reduced EF LVEF 25%, recurrent right-sided pleural effusions, permanent pacemaker placement, hypertension, diabetes, who presented to the ED for evaluation of shortness of breath.  She was admitted earlier this month for acute on chronic systolic CHF with right-sided pleural effusion for which she underwent thoracentesis on 11/4, 900 cc removed.  She underwent additional thoracentesis 11/7 additional 700 cc removed.  At that time she was found have a small pulmonary embolism and she was switched from Eliquis  to Xarelto . This admission while patient was admitting room placement in the ED she became suddenly unresponsive and was found to be in PEA.  She underwent 1 round of CPR and ACLS prior to ROSC.  She then became obtunded and was emergently intubated for airway protection.  About 20 minutes after this event she went into PEA again and received another round of CPR and then achieved ROSC.  She was then transferred to the ICU.  She was extubated 11/20 and transferred back to [facility] on 11/21. Stay has been further complicated by severe AKI and metabolic acidosis.  Patient was started on hemodialysis on 11/23.  Patient underwent attempted permacath placement on 11/25.  During permacath placement she received Versed  and fentanyl  and subsequently underwent respiratory arrest.  She required Narcan  before returning to spontaneous respirations and then was transferred to the ICU. Patient became increasingly dyspneic on 11/27 and CXR revealed worsening right-sided pleural effusion.  She underwent thoracentesis 1.2 L fluid removed. Patient tolerated dialysis well on 11/28.  And  had significant improvement.   12/1 patient became short of breath again.  Pleural effusion recurred.  Transferred back to stepdown unit.  Urgent dialysis performed. 12/2: Repeat thoracentesis of right lung with IR 1550 mL removed.  Subjective: This morning patient is still very nauseated.  She still has some shortness of breath but does report it is significantly improved compared to yesterday. We again discussed her overall prognosis and how recurrent pleural effusions is a poor prognostic sign.  Endorses understanding.  Objective: Vitals:   02/23/24 0400 02/23/24 0500 02/23/24 0600 02/23/24 0700  BP: 139/85 131/86 136/84 137/78  Pulse: 71 72 78 80  Resp: (!) 25 16 19 18   Temp:      TempSrc:      SpO2: 99% 100% 99% 98%  Weight:  71 kg    Height:        Intake/Output Summary (Last 24 hours) at 02/23/2024 1543 Last data filed at 02/22/2024 2345 Gross per 24 hour  Intake --  Output 2250 ml  Net -2250 ml   Filed Weights   02/22/24 1930 02/22/24 2345 02/23/24 0500  Weight: 77.1 kg 75.1 kg 71 kg    Examination: General exam: No Acute distress, nontoxic-appearing, appears more energetic than prior evaluations Respiratory system: some dyspnea when speaking, breathing easily, crackles in right lower lung base cardiovascular system: S1 & S2 heard, RRR.  Gastrointestinal system: Abdomen is nondistended, soft and nontender.  Neuro: Alert and oriented. No focal neurological deficits  Assessment & Plan:  Principal Problem:   CAP (community acquired pneumonia) Active Problems:   Acute renal failure   Encounter for dialysis and dialysis catheter care   Cardiac arrest (HCC)  Pressure injury of skin     Poor prognosis - This patient has had a long and complicated hospital stay.  Thus far she has had 2 PEA arrests and 1 respiratory arrest. She has recurrent high-volume right-sided pleural effusions despite dialysis.  She also has had very minimal urinary output with IV  diuresis. - Care plan was discussed today with the intensivist, cardiology, advanced heart failure team, and nephrology.  Presently, Dr. Zenaida from advanced heart failure team reports that given her age and renal function she is not a candidate for advanced therapies but he does recommend more frequent dialysis. - Unfortunately given the recurrence of her effusions despite dialysis, her prognosis is very poor.  She may be appropriate for hospice. - Palliative care has been consulted and seen the patient.  Currently the patient would like full aggressive care  PEA arrest x2 -Patient has extensive cardiac history including significantly reduced EF and PPM ICD in place. - Continue monitoring on telemetry ICU - Cardiology consulted. 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  No plans for inpatient ischemic workup at this time.  Appreciate any follow-up care. - Continue monitoring on telemetry  Respiratory arrest - Occurred after receiving 2 mg Versed  and 50 mcg of fentanyl  as conscious sedation appropriation for permacath.  Patient was given Narcan  and bagged.  Returned to spontaneous respiration. - Continue monitoring respiratory status closely - Currently on nasal cannula and O2 sats are 99%  Acute hypoxic respiratory failure  Pulm edema Moderate to large pleural effusion  -CT on arrival showed diffuse bronchial wall thickening with mucous plugging, later showed rapid development of pulmonary edema and pleural effusions - 11/4: 900 cc removed - 11/7: 700 cc removed - 11/27 1.2 L removed - 12/1: 1550 cc removed - Has been on IV Lasix  but patient has had minimal urinary output - Will continue with aggressive Lasix  in the event that it provides even minimal benefit - Continue with volume management through hemodialysis though this seems to have minimal impact on her oral effusions which are rapidly reaccumulating -Continue with symptomatic support, nebulizers  Parainfluenza virus  3 infection Continue supportive care with nebulizers, Tessalon  - Flu/COVID/RSV negative multiple times this month   Resumed aspiration pneumonia -Status post 7 days Unasyn  - No fever or leukocytosis now  Acute on chronic Heart failure with reduced EF 20 to 25%  PPM-ICD -Hemodialysis now for volume removal, tolerating dialysis better now. -EF this admission 30 to 35%, global hypokinesis - Per Dr. Zenaida, advanced heart failure, patient is not a candidate for advanced heart failure therapies given her advanced age and renal function   Oliguric AKI superimposed on CKD stage IIIb, has now progressed to ESRD  Metabolic acidosis -Complicated picture.  Initial ATN from hypotension and IV contrast exposure certainly worsened by cardiorenal syndrome. - Initially hoped patient would make renal recovery but she is increasingly uremic, hyperkalemic, and hypervolemic in between dialysis sessions - She will likely need lifelong dialysis.  Outpatient hemodialysis has already been established -Have discussed with nephrology for more frequent dialysis while admitted. - Attempted permacath on 11/25 but this was aborted due to respiratory arrest. - Vascular surgery to attempt permacath again today.  Eliquis  has been held for now.  Will resume when cleared by vascular surgery - Started on hemodialysis 11/23 - Strict I's and O's, patient endorses urination but output has been too minimal to record per RN  Electrolyte derangements Hypocalcemia - Status post replacement.  Improved now.  Continue to monitor daily CMP  Hypokalemia - Replace as needed - Follow-up CMP  Mild hyperkalemia -Continue with dialysis and shifters as needed   History of PAF  - Continue carvedilol  --switched from Xarelto  to Eliquis  due to kidney function  Hx of PE --switched from Xarelto  to Eliquis  due to kidney function --cont Eliquis    DM2 Hypoglycemia --hypoglycemia due to poor oral intake -- Sliding scale very  sensitive.  Discontinue basal insulin    Acute 7th rib fracture --Acute minimally displaced fracture of the left anterolateral seventh rib.  --from CPR during codes --pt doesn't want to take opioid pain meds. --tylenol  PRN  Body mass index is 29.58 kg/m. - Outpatient follow up for lifestyle modification and risk factor management   DVT prophylaxis: Eliquis  on hold for planned permacath in a.m.   Code Status: Full Code Disposition: Continue to monitor on telemetry.  Pending significant clinical improvement  Consultants:    Procedures:  attempted permacath 11/25  Antimicrobials:  Anti-infectives (From admission, onward)    Start     Dose/Rate Route Frequency Ordered Stop   02/16/24 0915  ceFAZolin  (ANCEF ) IVPB 1 g/50 mL premix  Status:  Discontinued        1 g 100 mL/hr over 30 Minutes Intravenous 30 min pre-op 02/16/24 0916 02/16/24 1232   02/11/24 1200  Ampicillin -Sulbactam (UNASYN ) 3 g in sodium chloride  0.9 % 100 mL IVPB  Status:  Discontinued        3 g 200 mL/hr over 30 Minutes Intravenous Every 12 hours 02/11/24 0749 02/16/24 1922   02/10/24 2230  azithromycin  (ZITHROMAX ) 500 mg in sodium chloride  0.9 % 250 mL IVPB  Status:  Discontinued        500 mg 250 mL/hr over 60 Minutes Intravenous Every 24 hours 02/10/24 0146 02/10/24 1016   02/10/24 0245  Ampicillin -Sulbactam (UNASYN ) 3 g in sodium chloride  0.9 % 100 mL IVPB  Status:  Discontinued        3 g 200 mL/hr over 30 Minutes Intravenous Every 6 hours 02/10/24 0158 02/11/24 0749   02/09/24 2115  cefTRIAXone  (ROCEPHIN ) 1 g in sodium chloride  0.9 % 100 mL IVPB        1 g 200 mL/hr over 30 Minutes Intravenous  Once 02/09/24 2108 02/09/24 2231   02/09/24 2115  azithromycin  (ZITHROMAX ) 500 mg in sodium chloride  0.9 % 250 mL IVPB        500 mg 250 mL/hr over 60 Minutes Intravenous  Once 02/09/24 2108 02/09/24 2336       Data Reviewed: I have personally reviewed following labs and imaging studies CBC: Recent Labs  Lab  02/19/24 0500 02/20/24 0354 02/21/24 0504 02/22/24 0215 02/23/24 0518  WBC 7.3 8.2 8.6 8.3 8.1  NEUTROABS 3.9 5.1 5.7 5.6 5.5  HGB 13.3 13.1 13.4 13.2 13.0  HCT 40.7 40.5 40.8 40.4 39.2  MCV 88.3 88.8 88.1 88.6 87.5  PLT 199 198 195 180 178   Basic Metabolic Panel: Recent Labs  Lab 02/19/24 0500 02/19/24 0940 02/20/24 0354 02/21/24 0504 02/22/24 0215 02/23/24 0000 02/23/24 0518  NA 131*  --  131* 129* 125* 128* 128*  K 6.1*   < > 4.8 5.5* 5.4* 3.9 4.2  CL 96*  --  95* 92* 89* 92* 92*  CO2 25  --  27 27 26 26 25   GLUCOSE 128*  --  148* 164* 176* 103* 102*  BUN 43*  --  28* 33* 40* 26* 28*  CREATININE 2.37*  --  1.66* 1.82* 2.05* 1.36* 1.57*  CALCIUM   9.3  --  8.8* 9.0 8.8* 8.6* 8.6*  MG 2.4  --  2.0 2.1 2.4  --  2.2  PHOS 4.4  --  3.4 4.2 4.8*  --  3.9   < > = values in this interval not displayed.   GFR: Estimated Creatinine Clearance: 27.9 mL/min (A) (by C-G formula based on SCr of 1.57 mg/dL (H)). Liver Function Tests: Recent Labs  Lab 02/19/24 0500 02/20/24 0354 02/21/24 0504 02/22/24 0215 02/23/24 0518  AST 76* 54* 39 40 34  ALT 97* 75* 64* 51* 45*  ALKPHOS 81 79 77 73 75  BILITOT 0.8 0.7 0.8 0.7 0.9  PROT 6.9 6.8 6.7 6.4* 6.5  ALBUMIN 3.4* 3.2* 3.4* 3.2* 3.3*   CBG: Recent Labs  Lab 02/22/24 1152 02/22/24 1718 02/22/24 1814 02/23/24 0747 02/23/24 1133  GLUCAP 258* 208* 178* 113* 137*    Recent Results (from the past 240 hours)  Resp panel by RT-PCR (RSV, Flu A&B, Covid) Anterior Nasal Swab     Status: None   Collection Time: 02/17/24  6:19 PM   Specimen: Anterior Nasal Swab  Result Value Ref Range Status   SARS Coronavirus 2 by RT PCR NEGATIVE NEGATIVE Final    Comment: (NOTE) SARS-CoV-2 target nucleic acids are NOT DETECTED.  The SARS-CoV-2 RNA is generally detectable in upper respiratory specimens during the acute phase of infection. The lowest concentration of SARS-CoV-2 viral copies this assay can detect is 138 copies/mL. A negative  result does not preclude SARS-Cov-2 infection and should not be used as the sole basis for treatment or other patient management decisions. A negative result may occur with  improper specimen collection/handling, submission of specimen other than nasopharyngeal swab, presence of viral mutation(s) within the areas targeted by this assay, and inadequate number of viral copies(<138 copies/mL). A negative result must be combined with clinical observations, patient history, and epidemiological information. The expected result is Negative.  Fact Sheet for Patients:  bloggercourse.com  Fact Sheet for Healthcare Providers:  seriousbroker.it  This test is no t yet approved or cleared by the [place]  FDA and  has been authorized for detection and/or diagnosis of SARS-CoV-2 by FDA under an Emergency Use Authorization (EUA). This EUA will remain  in effect (meaning this test can be used) for the duration of the COVID-19 declaration under Section 564(b)(1) of the Act, 21 U.S.C.section 360bbb-3(b)(1), unless the authorization is terminated  or revoked sooner.       Influenza A by PCR NEGATIVE NEGATIVE Final   Influenza B by PCR NEGATIVE NEGATIVE Final    Comment: (NOTE) The Xpert Xpress SARS-CoV-2/FLU/RSV plus assay is intended as an aid in the diagnosis of influenza from Nasopharyngeal swab specimens and should not be used as a sole basis for treatment. Nasal washings and aspirates are unacceptable for Xpert Xpress SARS-CoV-2/FLU/RSV testing.  Fact Sheet for Patients: bloggercourse.com  Fact Sheet for Healthcare Providers: seriousbroker.it  This test is not yet approved or cleared by the [place]  FDA and has been authorized for detection and/or diagnosis of SARS-CoV-2 by FDA under an Emergency Use Authorization (EUA). This EUA will remain in effect (meaning this test can be used)  for the duration of the COVID-19 declaration under Section 564(b)(1) of the Act, 21 U.S.C. section 360bbb-3(b)(1), unless the authorization is terminated or revoked.     Resp Syncytial Virus by PCR NEGATIVE NEGATIVE Final    Comment: (NOTE) Fact Sheet for Patients: bloggercourse.com  Fact Sheet for Healthcare Providers: seriousbroker.it  This test is  not yet approved or cleared by the [place]  FDA and has been authorized for detection and/or diagnosis of SARS-CoV-2 by FDA under an Emergency Use Authorization (EUA). This EUA will remain in effect (meaning this test can be used) for the duration of the COVID-19 declaration under Section 564(b)(1) of the Act, 21 U.S.C. section 360bbb-3(b)(1), unless the authorization is terminated or revoked.  Performed at [facility], [address] [place], [place] [address]      Radiology Studies: [facility] Chest [place] 1 View Result Date: 02/23/2024 CLINICAL DATA:  142230 Pleural effusion [id number] Status post thoracentesis [id number] EXAM: PORTABLE CHEST 1 VIEW COMPARISON:  IR THORACENTESIS, EARLIER SAME DAY. CHEST XR, 02/22/2024. CT CHEST, 02/10/2024. FINDINGS: Support lines; LEFT chest subclavian-approach pacer with intact dual leads. Mild enlargement of the cardiac silhouette. Aortic arch atherosclerosis. Improved aeration of the RIGHT lung post thoracentesis without residual pleural effusion. No pneumothorax. Similar well-aerated LEFT lung. No focal consolidation. No acute osseous abnormality. IMPRESSION: 1. Improved aeration of the RIGHT lung post thoracentesis without residual pleural effusion. No pneumothorax. 2. Cardiomegaly and Aortic Atherosclerosis (ICD10-I70.0). Electronically Signed   By: Thom Hall M.D.   On: 02/23/2024 13:35   US  THORACENTESIS ASP PLEURAL SPACE W/IMG GUIDE Result Date: 02/23/2024 INDICATION: Symptomatic RIGHT sided pleural effusion. Prior RIGHT thoracentesis  02/17/2024, with 1.2 L aspirated. EXAM: US  THORACENTESIS ASP PLEURAL SPACE W/IMG GUIDE COMPARISON:  Chest XR, 02/22/2024. MEDICATIONS: None. COMPLICATIONS: None immediate. TECHNIQUE: Informed written consent was obtained from the patient after a discussion of the risks, benefits and alternatives to treatment. A timeout was performed prior to the initiation of the procedure. Initial ultrasound scanning demonstrates a moderate-to-large volume RIGHT pleural effusion. The lower chest was prepped and draped in the usual sterile fashion. 1% lidocaine  was used for local anesthesia. An ultrasound image was saved for documentation purposes. A 6 Fr Safe-T-Centesis catheter was introduced. The thoracentesis was performed. The catheter was removed and a dressing was applied. The patient tolerated the procedure well without immediate post procedural complication. The patient was escorted to have an upright chest radiograph. FINDINGS: A total of approximately 1550 mL of serosanguineous fluid was removed. Requested samples were sent to the laboratory. IMPRESSION: Successful ultrasound-guided diagnostic and therapeutic RIGHT thoracentesis yielding 1550 mL of pleural fluid. Thom Hall, MD [facility] Radiology Electronically Signed   By: Thom Hall M.D.   On: 02/23/2024 11:54   DG Chest Port 1 View Result Date: 02/22/2024 EXAM: 1 VIEW(S) XRAY OF THE CHEST 02/22/2024 05:12:34 PM COMPARISON: 02/17/2024 CLINICAL HISTORY: Dyspnea FINDINGS: LINES, TUBES AND DEVICES: Multiple wires and leads project over the chest on the frontal radiograph. Dual pacer is present and changed. LUNGS AND PLEURA: Moderate right pleural effusion increased. Right mid and lower lung airspace disease, most likely atelectasis. No pneumothorax. HEART AND MEDIASTINUM: Moderate cardiomegaly with mild interstitial edema. BONES AND SOFT TISSUES: No acute osseous abnormality. IMPRESSION: 1. Worsened right mid and  lower lung airspace disease, most likely atelectasis. 2. Reaccumulation of moderate right pleural effusion. 3. Cardiomegaly with mild interstitial edema. Electronically signed by: Rockey Kilts MD 02/22/2024 05:59 PM EST RP Workstation: [id number]     Scheduled Meds:  vitamin C   500 mg Oral BID   carvedilol   6.25 mg Oral BID WC   Chlorhexidine  Gluconate Cloth  6 each Topical Q0600   feeding supplement (NEPRO CARB STEADY)  237 mL Oral BID BM   furosemide   80 mg Intravenous Q12H   gabapentin   100 mg Oral  BID   insulin  aspart  0-6 Units Subcutaneous TID WC   lidocaine   1 patch Transdermal Q24H   multivitamin  1 tablet Oral QHS   multivitamin with minerals  1 tablet Oral Daily   mouth rinse  15 mL Mouth Rinse 4 times per day   pantoprazole   40 mg Oral Daily   sodium chloride  flush  3 mL Intravenous Q12H   Continuous Infusions:   LOS: 14 days  CRITICAL CARETotal critical care time: 65 minutes Critical care time was exclusive of separately billable procedures and treating other patients. Critical care was necessary to treat or prevent imminent or life-threatening deterioration. Critical care was time spent personally by me on the following activities: development of treatment plan with patient and/or surrogate as well as nursing, discussions with consultants, evaluation of patient's response to treatment, examination of patient, obtaining history from patient or surrogate, ordering and performing treatments and interventions, ordering and review of laboratory studies, ordering and review of radiographic studies, pulse oximetry and re-evaluation of patient's condition.   Deborah Chrismer, DO [facility]  To contact the attending physician between 7A-7P please use Epic Chat. To contact the covering physician during after hours 7P-7A, please review Amion.  02/23/2024, 3:43 PM   *This document has been created with the assistance of dictation software. Please excuse typographical errors.  *

## 2024-02-23 NOTE — H&P (View-Only) (Signed)
  Progress Note    02/23/2024 8:30 AM 7 Days Post-Op  Subjective:  Deborah Dawson is a  73 y.o. female with medical history significant of PAF on Eliquis , chronic HFrEF LVEF 25% with recurrent right-sided pleural effusion, PPM-ICD, HTN, IDDM, gout, presented with worsening of shortness of breath and leg swelling.  Patient experienced increased shortness of breath last night and was moved back to ICU for closer monitoring.  Patient underwent dialysis last night 2 L removed.  This morning patient is resting comfortably in bed.  She endorses she feels better this morning.  Patient was evaluated by the ICU team who consulted interventional radiology for right thoracentesis.  Patient will undergo this this morning.  Patient's vitals are stable this morning.  Oxygen saturation remains 92% on 2 L nasal cannula oxygen.   Vitals:   02/23/24 0600 02/23/24 0700  BP: 136/84 137/78  Pulse: 78 80  Resp: 19 18  Temp:    SpO2: 99% 98%   Physical Exam: Cardiac: Irregular rate with atrial fibrillation., normal S1 and S2.  No murmurs noted. Lungs: Patient remains on 2 L nasal cannula oxygen.  Labored breathing noted with ambulation.  On auscultation patient noted to have rhonchorous lungs with rales in bilateral bases.  On chest x-ray from yesterday patient has reaccumulation of pleural effusion. Incisions: None Extremities: All extremities warm to touch with palpable pulses.  Bilateral lower extremities with +1 to +2 edema. Abdomen: Positive bowel sounds throughout, soft, nontender and nondistended. Neurologic: Alert and oriented x 3, answers all questions and follows commands appropriately.  CBC    Component Value Date/Time   WBC 8.1 02/23/2024 0518   RBC 4.48 02/23/2024 0518   HGB 13.0 02/23/2024 0518   HCT 39.2 02/23/2024 0518   PLT 178 02/23/2024 0518   MCV 87.5 02/23/2024 0518   MCH 29.0 02/23/2024 0518   MCHC 33.2 02/23/2024 0518   RDW 17.5 (H) 02/23/2024 0518   LYMPHSABS 1.4 02/23/2024  0518   MONOABS 0.8 02/23/2024 0518   EOSABS 0.3 02/23/2024 0518   BASOSABS 0.1 02/23/2024 0518    BMET    Component Value Date/Time   NA 128 (L) 02/23/2024 0518   K 4.2 02/23/2024 0518   CL 92 (L) 02/23/2024 0518   CO2 25 02/23/2024 0518   GLUCOSE 102 (H) 02/23/2024 0518   BUN 28 (H) 02/23/2024 0518   CREATININE 1.57 (H) 02/23/2024 0518   CALCIUM  8.6 (L) 02/23/2024 0518   GFRNONAA 34 (L) 02/23/2024 0518   GFRAA >60 06/26/2015 0601    INR    Component Value Date/Time   INR 2.5 (H) 02/10/2024 0213     Intake/Output Summary (Last 24 hours) at 02/23/2024 0830 Last data filed at 02/22/2024 2345 Gross per 24 hour  Intake --  Output 2250 ml  Net -2250 ml     Assessment/Plan:  73 y.o. female is s/p attempted dialysis permacatheter insertion.  7 Days Post-Op   PLAN Patient undergo right thoracentesis for right pulmonary effusion this morning. If the patient does well we will proceed with placing dialysis permacatheter for continued need of hemodialysis.  After procedures completed by interventional radiology this morning.  DVT prophylaxis: Patient's Eliquis  was held for both procedures today right lung thoracentesis as well as dialysis access permacatheter placement.   Deborah Dawson [facility] 02/23/2024 8:30 AM

## 2024-02-24 ENCOUNTER — Encounter: Payer: Self-pay | Admitting: Vascular Surgery

## 2024-02-24 ENCOUNTER — Encounter: Admission: EM | Disposition: A | Payer: Self-pay | Source: Home / Self Care | Attending: Family Medicine

## 2024-02-24 DIAGNOSIS — E875 Hyperkalemia: Secondary | ICD-10-CM | POA: Insufficient documentation

## 2024-02-24 DIAGNOSIS — N1832 Chronic kidney disease, stage 3b: Secondary | ICD-10-CM | POA: Insufficient documentation

## 2024-02-24 DIAGNOSIS — R7989 Other specified abnormal findings of blood chemistry: Secondary | ICD-10-CM | POA: Insufficient documentation

## 2024-02-24 DIAGNOSIS — E871 Hypo-osmolality and hyponatremia: Secondary | ICD-10-CM | POA: Insufficient documentation

## 2024-02-24 LAB — PHOSPHORUS: Phosphorus: 4.2 mg/dL (ref 2.5–4.6)

## 2024-02-24 LAB — CBC
HCT: 38.6 % (ref 36.0–46.0)
Hemoglobin: 12.9 g/dL (ref 12.0–15.0)
MCH: 28.9 pg (ref 26.0–34.0)
MCHC: 33.4 g/dL (ref 30.0–36.0)
MCV: 86.4 fL (ref 80.0–100.0)
Platelets: 194 K/uL (ref 150–400)
RBC: 4.47 MIL/uL (ref 3.87–5.11)
RDW: 17.7 % — ABNORMAL HIGH (ref 11.5–15.5)
WBC: 8 K/uL (ref 4.0–10.5)
nRBC: 0.8 % — ABNORMAL HIGH (ref 0.0–0.2)

## 2024-02-24 LAB — GLUCOSE, CAPILLARY
Glucose-Capillary: 137 mg/dL — ABNORMAL HIGH (ref 70–99)
Glucose-Capillary: 143 mg/dL — ABNORMAL HIGH (ref 70–99)
Glucose-Capillary: 214 mg/dL — ABNORMAL HIGH (ref 70–99)

## 2024-02-24 LAB — COMPREHENSIVE METABOLIC PANEL WITH GFR
ALT: 37 U/L (ref 0–44)
AST: 30 U/L (ref 15–41)
Albumin: 3.2 g/dL — ABNORMAL LOW (ref 3.5–5.0)
Alkaline Phosphatase: 74 U/L (ref 38–126)
Anion gap: 11 (ref 5–15)
BUN: 35 mg/dL — ABNORMAL HIGH (ref 8–23)
CO2: 27 mmol/L (ref 22–32)
Calcium: 8.8 mg/dL — ABNORMAL LOW (ref 8.9–10.3)
Chloride: 92 mmol/L — ABNORMAL LOW (ref 98–111)
Creatinine, Ser: 1.8 mg/dL — ABNORMAL HIGH (ref 0.44–1.00)
GFR, Estimated: 29 mL/min — ABNORMAL LOW (ref >60)
Glucose, Bld: 141 mg/dL — ABNORMAL HIGH (ref 70–99)
Potassium: 4.3 mmol/L (ref 3.5–5.1)
Sodium: 130 mmol/L — ABNORMAL LOW (ref 135–145)
Total Bilirubin: 1.1 mg/dL (ref 0.0–1.2)
Total Protein: 6.3 g/dL — ABNORMAL LOW (ref 6.5–8.1)

## 2024-02-24 MED ORDER — MIDAZOLAM HCL 2 MG/2ML IJ SOLN
INTRAMUSCULAR | Status: AC
  Filled 2024-02-24: qty 2

## 2024-02-24 MED ORDER — CHLORHEXIDINE GLUCONATE CLOTH 2 % EX PADS: 6.0000 | MEDICATED_PAD | Freq: Every day | CUTANEOUS | Status: DC

## 2024-02-24 MED ORDER — HEPARIN SODIUM (PORCINE) 10000 UNIT/ML IJ SOLN
INTRAMUSCULAR | Status: DC | PRN
  Administered 2024-02-24: 10000 [IU]

## 2024-02-24 MED ORDER — HYDROCORTISONE 1 % EX CREA: TOPICAL_CREAM | Freq: Three times a day (TID) | CUTANEOUS | Status: DC | PRN

## 2024-02-24 MED ORDER — FENTANYL CITRATE (PF) 50 MCG/ML IJ SOSY
PREFILLED_SYRINGE | INTRAMUSCULAR | Status: AC
  Filled 2024-02-24: qty 1

## 2024-02-24 MED ORDER — ASCORBIC ACID 500 MG PO TABS: 500.0000 mg | ORAL_TABLET | Freq: Two times a day (BID) | ORAL | Status: DC

## 2024-02-24 MED ORDER — HEPARIN (PORCINE) IN NACL 1000-0.9 UT/500ML-% IV SOLN
INTRAVENOUS | Status: DC | PRN
  Administered 2024-02-24: 500 mL

## 2024-02-24 MED ORDER — ALTEPLASE 2 MG IJ SOLR: 2.0000 mg | Freq: Once | INTRAMUSCULAR | Status: DC | PRN

## 2024-02-24 MED ORDER — PANTOPRAZOLE SODIUM 40 MG PO TBEC: 40.0000 mg | DELAYED_RELEASE_TABLET | Freq: Every day | ORAL | Status: DC

## 2024-02-24 MED ORDER — SALINE SPRAY 0.65 % NA SOLN: 1.0000 | NASAL | Status: DC | PRN

## 2024-02-24 MED ORDER — CARVEDILOL 6.25 MG PO TABS: 6.2500 mg | ORAL_TABLET | Freq: Two times a day (BID) | ORAL | Status: DC

## 2024-02-24 MED ORDER — HYDRALAZINE HCL 20 MG/ML IJ SOLN: 10.0000 mg | INTRAMUSCULAR | Status: DC | PRN

## 2024-02-24 MED ORDER — ACETAMINOPHEN 500 MG PO TABS
ORAL_TABLET | ORAL | Status: DC
  Filled 2024-02-24: qty 2

## 2024-02-24 MED ORDER — IPRATROPIUM-ALBUTEROL 0.5-2.5 (3) MG/3ML IN SOLN: 3.0000 mL | Freq: Four times a day (QID) | RESPIRATORY_TRACT | Status: DC | PRN

## 2024-02-24 MED ORDER — FUROSEMIDE 10 MG/ML IJ SOLN: 80.0000 mg | Freq: Two times a day (BID) | INTRAMUSCULAR | Status: DC

## 2024-02-24 MED ORDER — ACETAMINOPHEN 500 MG PO TABS: 1000.0000 mg | ORAL_TABLET | Freq: Three times a day (TID) | ORAL | Status: DC | PRN

## 2024-02-24 MED ORDER — METOCLOPRAMIDE HCL 5 MG/ML IJ SOLN: 5.0000 mg | Freq: Three times a day (TID) | INTRAMUSCULAR | Status: DC | PRN

## 2024-02-24 MED ORDER — MENTHOL 3 MG MT LOZG: 1.0000 | LOZENGE | OROMUCOSAL | Status: DC | PRN

## 2024-02-24 MED ORDER — ALUM & MAG HYDROXIDE-SIMETH 200-200-20 MG/5ML PO SUSP
30.0000 mL | ORAL | Status: DC | PRN
  Administered 2024-02-24: 30 mL via ORAL
  Filled 2024-02-24: qty 30

## 2024-02-24 MED ORDER — HEPARIN SODIUM (PORCINE) 1000 UNIT/ML IJ SOLN
INTRAMUSCULAR | Status: AC
  Filled 2024-02-24: qty 5

## 2024-02-24 MED ORDER — NEPRO/CARBSTEADY PO LIQD: 237.0000 mL | Freq: Two times a day (BID) | ORAL | Status: DC

## 2024-02-24 MED ORDER — PROMETHAZINE HCL 25 MG PO TABS: 25.0000 mg | ORAL_TABLET | Freq: Four times a day (QID) | ORAL | Status: DC | PRN

## 2024-02-24 MED ORDER — PROCHLORPERAZINE EDISYLATE 10 MG/2ML IJ SOLN: 5.0000 mg | INTRAMUSCULAR | Status: DC | PRN

## 2024-02-24 MED ORDER — INSULIN ASPART 100 UNIT/ML IJ SOLN: 0.0000 [IU] | Freq: Three times a day (TID) | INTRAMUSCULAR | Status: DC

## 2024-02-24 MED ORDER — CEFAZOLIN SODIUM-DEXTROSE 1-4 GM/50ML-% IV SOLN
1.0000 g | INTRAVENOUS | Status: AC
  Administered 2024-02-24: 1 g via INTRAVENOUS

## 2024-02-24 MED ORDER — OXYCODONE HCL 5 MG PO TABS: 5.0000 mg | ORAL_TABLET | Freq: Four times a day (QID) | ORAL | Status: DC | PRN

## 2024-02-24 MED ORDER — HYDROCODONE BIT-HOMATROP MBR 5-1.5 MG/5ML PO SOLN: 5.0000 mL | Freq: Four times a day (QID) | ORAL | Status: DC | PRN

## 2024-02-24 MED ORDER — LIDOCAINE 5 % EX PTCH: 1.0000 | MEDICATED_PATCH | CUTANEOUS | Status: DC

## 2024-02-24 MED ORDER — BENZONATATE 100 MG PO CAPS: 100.0000 mg | ORAL_CAPSULE | Freq: Three times a day (TID) | ORAL | Status: DC | PRN

## 2024-02-24 MED ORDER — LIDOCAINE-EPINEPHRINE (PF) 1 %-1:200000 IJ SOLN
INTRAMUSCULAR | Status: DC | PRN
  Administered 2024-02-24: 20 mL

## 2024-02-24 MED ORDER — HEPARIN SODIUM (PORCINE) 1000 UNIT/ML DIALYSIS: 1000.0000 [IU] | INTRAMUSCULAR | Status: DC | PRN

## 2024-02-24 MED ORDER — SODIUM CHLORIDE 0.9 % IV SOLN: INTRAVENOUS | Status: DC

## 2024-02-24 MED ORDER — RENA-VITE PO TABS: 1.0000 | ORAL_TABLET | Freq: Every day | ORAL | Status: DC

## 2024-02-24 MED ORDER — POLYETHYLENE GLYCOL 3350 17 G PO PACK: 17.0000 g | PACK | Freq: Every day | ORAL | Status: DC | PRN

## 2024-02-24 SURGICAL SUPPLY — 8 items
BIOPATCH RED 1 DISK 7.0 (GAUZE/BANDAGES/DRESSINGS) IMPLANT
CATH HEMO 15FR 19 SMART SEAL (HEMODIALYSIS SUPPLIES) IMPLANT
COVER PROBE ULTRASOUND 5X96 (MISCELLANEOUS) IMPLANT
DERMABOND ADVANCED .7 DNX12 (GAUZE/BANDAGES/DRESSINGS) IMPLANT
GOWN STRL REUS W/ TWL LRG LVL3 (GOWN DISPOSABLE) ×1 IMPLANT
PACK ANGIOGRAPHY (CUSTOM PROCEDURE TRAY) ×1 IMPLANT
SUT MNCRL AB 4-0 PS2 18 (SUTURE) IMPLANT
SUT PROLENE 0 CT 1 30 (SUTURE) IMPLANT

## 2024-02-24 NOTE — Progress Notes (Signed)
 Patient to IR

## 2024-02-24 NOTE — Progress Notes (Signed)
 Nephro team and Al- Sultani MD approved patient to go down for HD with out RN. Patient is stable and appropriate.

## 2024-02-24 NOTE — Progress Notes (Signed)
 NAME:  Deborah Dawson, MRN:  [id number], DOB:  21-Oct-1950, LOS: 15 ADMISSION DATE:  02/09/2024, CONSULTATION DATE:  02/22/24 REFERRING MD:  Dr. Lorane Poland REASON FOR CONSULT:  Acute respiratory distress   HPI  73 year old woman with HFrEF (NYHA II), PPM, LBBB, PAF on Xarelto , HTN, T2DM, CKD3a, and recent hospitalization (11/2-11/8) for acute on chronic heart failure, right pleural effusion, and small PE (status post two thoracenteses), now presents on 11/18 with progressively worsening shortness of breath since 11/13. She also reports chronic cough with clear-yellow sputum. Outpatient CXR on 11/17 showed improved aeration but persistent small pleural effusion. She denies other symptoms and has been taking medications as prescribed.   ED Course: Initial vital showed she was hypertensive (162/106), tachypneic (RR 24), and maintaining oxygen saturation of 95% on room air. She was placed on 2 L nasal cannula. Labs showed: Na 132, K 5.9, CO? 21, AG 9, BUN/Cr 35/1.24, WBC 5.7, Hgb 14.0, troponin 49; BNP, lactic acid, procalcitonin, COVID-19, and influenza A/B remained pending. CTA chest negative for PE, but did demonstrate diffuse bronchial wall thickening with mucus plugging, probable developing right lower lobe consolidation, and a small right pleural effusion. Chest X-ray showed extensive right-lung airspace disease concerning for asymmetric pulmonary edema versus pneumonia versus pulmonary hemorrhage. ECG Rate 117, irregular rhythm consistent with irregular ventricular tachycardia in the setting of chronic LBBB; mild STE in V1-V3 not meeting STEMI criteria.  The patient received IV ceftriaxone  and azithromycin  for suspected CAP and admitted to TRH service. While awaiting inpatient bed assignment, the patient reported nausea, followed by sudden circumoral cyanosis and unresponsiveness. She was found pulseless in PEA. One round of CPR and ACLS resulted in ROSC. She remained obtunded and was emergently  intubated for airway protection. Approximately 20 minutes later, she again became pulseless in PEA, and achieved ROSC after one ACLS cycle.  Post-ROSC imaging with non-contrast CT chest revealed extensive asymmetric pulmonary edema (R > L), multifocal consolidation, moderate-large right pleural effusion, and features consistent with pulmonary hypertension. PCCM was consulted for ongoing post-cardiac arrest care and further evaluation.  02/23/24- patient for repeat thoracentesis today.  She may need to have advanced heart failure evaluation due to concern for cardiorenal syndrome with anasarca and recurrent effusions.   02/24/24- patient in bed in no distress.  We discussed her care with [facility] to consider her for device therapy/destination due to advanced systolic CHF with cardiorenal syndrome and recurrent pleural effusions s/p thoracentesis x4.  They have declined to accept her due to renal dysfunction.  We are in process of speaking with [facility] to help us  with management.  Patient is at baseline completely independent she is not demented and has a good support system.  Today she will have perm cath placement for RRT.    Past Medical History  HFrEF NYHA class II with PPM in place LBBB PAF on Xarelto  HTN PE (01/24/24) T2DM CKD stage 3a? HTN   Consults:  Cardiology PCCM  Procedures:  11/18: Endotracheal Intubation 11/2: Thoracentesis   Antimicrobials:   Anti-infectives (From admission, onward)    Start     Dose/Rate Route Frequency Ordered Stop   02/16/24 0915  ceFAZolin  (ANCEF ) IVPB 1 g/50 mL premix  Status:  Discontinued        1 g 100 mL/hr over 30 Minutes Intravenous 30 min pre-op 02/16/24 0916 02/16/24 1232   02/11/24 1200  Ampicillin -Sulbactam (UNASYN ) 3 g in sodium chloride  0.9 % 100 mL IVPB  Status:  Discontinued  3 g 200 mL/hr over 30 Minutes Intravenous Every 12 hours 02/11/24 0749 02/16/24 1922   02/10/24 2230  azithromycin  (ZITHROMAX ) 500 mg in sodium chloride  0.9 %  250 mL IVPB  Status:  Discontinued        500 mg 250 mL/hr over 60 Minutes Intravenous Every 24 hours 02/10/24 0146 02/10/24 1016   02/10/24 0245  Ampicillin -Sulbactam (UNASYN ) 3 g in sodium chloride  0.9 % 100 mL IVPB  Status:  Discontinued        3 g 200 mL/hr over 30 Minutes Intravenous Every 6 hours 02/10/24 0158 02/11/24 0749   02/09/24 2115  cefTRIAXone  (ROCEPHIN ) 1 g in sodium chloride  0.9 % 100 mL IVPB        1 g 200 mL/hr over 30 Minutes Intravenous  Once 02/09/24 2108 02/09/24 2231   02/09/24 2115  azithromycin  (ZITHROMAX ) 500 mg in sodium chloride  0.9 % 250 mL IVPB        500 mg 250 mL/hr over 60 Minutes Intravenous  Once 02/09/24 2108 02/09/24 2336      OBJECTIVE  Blood pressure 126/80, pulse 93, temperature 97.8 F (36.6 C), temperature source Oral, resp. rate 17, height 5' 1 (1.549 m), weight 68.3 kg, SpO2 99%.        Intake/Output Summary (Last 24 hours) at 02/24/2024 0825 Last data filed at 02/24/2024 0300 Gross per 24 hour  Intake 100 ml  Output 500 ml  Net -400 ml   Filed Weights   02/22/24 2345 02/23/24 0500 02/24/24 0420  Weight: 75.1 kg 71 kg 68.3 kg   Physical Examination  GEN: age appropriate NAD HEENT: normocephalic, atraumatic, supple, no jvd HEART: regular rhythm, normal rate, S1, S2, no M/R/G,  LUNGS: CTAB, mild crackles without wheezes at bilateral bases, mildly increased WOB EXTREMITIES: normal bulk and tone, 1+ to 2+ lower extremity edema NEURO: No gross focal deficits. Alert and oriented x 4, follows commands.  ABDOMINAL: Soft, non-tender, non-distended, no rebound/guarding, bowel sounds x 4 SKIN: Intact, warm, no rashes lesion, or ulcer  Labs/imaging that I havepersonally reviewed  (right click and Reselect all SmartList Selections daily)   DG Chest Port 1 View Result Date: 02/23/2024 CLINICAL DATA:  142230 Pleural effusion [id number] Status post thoracentesis [id number] EXAM: PORTABLE CHEST 1 VIEW COMPARISON:  IR THORACENTESIS, EARLIER SAME  DAY. CHEST XR, 02/22/2024. CT CHEST, 02/10/2024. FINDINGS: Support lines; LEFT chest subclavian-approach pacer with intact dual leads. Mild enlargement of the cardiac silhouette. Aortic arch atherosclerosis. Improved aeration of the RIGHT lung post thoracentesis without residual pleural effusion. No pneumothorax. Similar well-aerated LEFT lung. No focal consolidation. No acute osseous abnormality. IMPRESSION: 1. Improved aeration of the RIGHT lung post thoracentesis without residual pleural effusion. No pneumothorax. 2. Cardiomegaly and Aortic Atherosclerosis (ICD10-I70.0). Electronically Signed   By: Thom Hall M.D.   On: 02/23/2024 13:35   US  THORACENTESIS ASP PLEURAL SPACE W/IMG GUIDE Result Date: 02/23/2024 INDICATION: Symptomatic RIGHT sided pleural effusion. Prior RIGHT thoracentesis 02/17/2024, with 1.2 L aspirated. EXAM: US  THORACENTESIS ASP PLEURAL SPACE W/IMG GUIDE COMPARISON:  Chest XR, 02/22/2024. MEDICATIONS: None. COMPLICATIONS: None immediate. TECHNIQUE: Informed written consent was obtained from the patient after a discussion of the risks, benefits and alternatives to treatment. A timeout was performed prior to the initiation of the procedure. Initial ultrasound scanning demonstrates a moderate-to-large volume RIGHT pleural effusion. The lower chest was prepped and draped in the usual sterile fashion. 1% lidocaine  was used for local anesthesia. An ultrasound image was saved for documentation purposes. A 6 Fr Safe-T-Centesis  catheter was introduced. The thoracentesis was performed. The catheter was removed and a dressing was applied. The patient tolerated the procedure well without immediate post procedural complication. The patient was escorted to have an upright chest radiograph. FINDINGS: A total of approximately 1550 mL of serosanguineous fluid was removed. Requested samples were sent to the laboratory. IMPRESSION: Successful ultrasound-guided diagnostic and therapeutic RIGHT thoracentesis  yielding 1550 mL of pleural fluid. Thom Hall, MD [facility] Electronically Signed   By: Thom Hall M.D.   On: 02/23/2024 11:54   DG Chest Port 1 View Result Date: 02/22/2024 EXAM: 1 VIEW(S) XRAY OF THE CHEST 02/22/2024 05:12:34 PM COMPARISON: 02/17/2024 CLINICAL HISTORY: Dyspnea FINDINGS: LINES, TUBES AND DEVICES: Multiple wires and leads project over the chest on the frontal radiograph. Dual pacer is present and changed. LUNGS AND PLEURA: Moderate right pleural effusion increased. Right mid and lower lung airspace disease, most likely atelectasis. No pneumothorax. HEART AND MEDIASTINUM: Moderate cardiomegaly with mild interstitial edema. BONES AND SOFT TISSUES: No acute osseous abnormality. IMPRESSION: 1. Worsened right mid and lower lung airspace disease, most likely atelectasis. 2. Reaccumulation of moderate right pleural effusion. 3. Cardiomegaly with mild interstitial edema. Electronically signed by: Rockey Kilts MD 02/22/2024 05:59 PM EST RP Workstation: [id number]     Post thoracentesis improved CXR   Labs   CBC: Recent Labs  Lab 02/19/24 0500 02/20/24 0354 02/21/24 0504 02/22/24 0215 02/23/24 0518  WBC 7.3 8.2 8.6 8.3 8.1  NEUTROABS 3.9 5.1 5.7 5.6 5.5  HGB 13.3 13.1 13.4 13.2 13.0  HCT 40.7 40.5 40.8 40.4 39.2  MCV 88.3 88.8 88.1 88.6 87.5  PLT 199 198 195 180 178    Basic Metabolic Panel: Recent Labs  Lab 02/19/24 0500 02/19/24 0940 02/20/24 0354 02/21/24 0504 02/22/24 0215 02/23/24 0000 02/23/24 0518  NA 131*  --  131* 129* 125* 128* 128*  K 6.1*   < > 4.8 5.5* 5.4* 3.9 4.2  CL 96*  --  95* 92* 89* 92* 92*  CO2 25  --  27 27 26 26 25   GLUCOSE 128*  --  148* 164* 176* 103* 102*  BUN 43*  --  28* 33* 40* 26* 28*  CREATININE 2.37*  --  1.66* 1.82* 2.05* 1.36* 1.57*  CALCIUM  9.3  --  8.8* 9.0 8.8* 8.6* 8.6*  MG 2.4  --  2.0 2.1 2.4  --  2.2  PHOS 4.4  --  3.4 4.2 4.8*  --  3.9   < > = values in this  interval not displayed.   GFR: Estimated Creatinine Clearance: 27.4 mL/min (A) (by C-G formula based on SCr of 1.57 mg/dL (H)). Recent Labs  Lab 02/20/24 0354 02/21/24 0504 02/22/24 0215 02/23/24 0518  WBC 8.2 8.6 8.3 8.1    Liver Function Tests: Recent Labs  Lab 02/19/24 0500 02/20/24 0354 02/21/24 0504 02/22/24 0215 02/23/24 0518  AST 76* 54* 39 40 34  ALT 97* 75* 64* 51* 45*  ALKPHOS 81 79 77 73 75  BILITOT 0.8 0.7 0.8 0.7 0.9  PROT 6.9 6.8 6.7 6.4* 6.5  ALBUMIN 3.4* 3.2* 3.4* 3.2* 3.3*   No results for input(s): LIPASE, AMYLASE in the last 168 hours. No results for input(s): AMMONIA in the last 168 hours.  ABG    Component Value Date/Time   PHART 7.35 02/16/2024 1121   PCO2ART 35 02/16/2024 1121   PO2ART 78 (L) 02/16/2024 1121   HCO3 19.3 (L) 02/16/2024 1121   ACIDBASEDEF 5.6 (  H) 02/16/2024 1121   O2SAT 95.5 02/16/2024 1121     Coagulation Profile: No results for input(s): INR, PROTIME in the last 168 hours.   Cardiac Enzymes: No results for input(s): CKTOTAL, CKMB, CKMBINDEX, TROPONINI in the last 168 hours.  HbA1C: Hgb A1c MFr Bld  Date/Time Value Ref Range Status  01/24/2024 10:05 AM 8.0 (H) 4.8 - 5.6 % Final    Comment:    (NOTE) Diagnosis of Diabetes The following HbA1c ranges recommended by the American Diabetes Association (ADA) may be used as an aid in the diagnosis of diabetes mellitus.  Hemoglobin             Suggested A1C NGSP%              Diagnosis  <5.7                   Non Diabetic  5.7-6.4                Pre-Diabetic  >6.4                   Diabetic  <7.0                   Glycemic control for                       adults with diabetes.    06/24/2015 04:42 PM 7.9 (H) 4.0 - 6.0 % Final    CBG: Recent Labs  Lab 02/23/24 0747 02/23/24 1133 02/23/24 1605 02/23/24 2119 02/24/24 0805  GLUCAP 113* 137* 127* 248* 137*    Review of Systems:     Past Medical History  She,  has a past medical  history of Allergic genetic state, Atrial fibrillation (HCC) (2010), Cardiomyopathy (HCC), CHF (congestive heart failure) (HCC), Diabetes mellitus without complication (HCC), Dysrhythmia, Gout, H/O cardiac catheterization, Hypertension, Joint pain, Osteopenia, Presence of permanent cardiac pacemaker, and Psoriasis.   Surgical History    Past Surgical History:  Procedure Laterality Date   ABDOMINAL HYSTERECTOMY  1985   BREAST EXCISIONAL BIOPSY Left    negative years ago   BREAST SURGERY Left 1997   papilloma   CARDIAC CATHETERIZATION Left 08/01/2015   Procedure: Left Heart Cath and Coronary Angiography;  Surgeon: Vinie DELENA Jude, MD;  Location: [facility] INVASIVE CV LAB;  Service: Cardiovascular;  Laterality: Left;   CHOLECYSTECTOMY  1986   COLONOSCOPY WITH PROPOFOL  N/A 08/26/2017   Procedure: COLONOSCOPY WITH PROPOFOL ;  Surgeon: Toledo, Ladell POUR, MD;  Location: [facility] ENDOSCOPY;  Service: Gastroenterology;  Laterality: N/A;   ESOPHAGOGASTRODUODENOSCOPY (EGD) WITH PROPOFOL  N/A 08/26/2017   Procedure: ESOPHAGOGASTRODUODENOSCOPY (EGD) WITH PROPOFOL ;  Surgeon: Toledo, Ladell POUR, MD;  Location: [facility] ENDOSCOPY;  Service: Gastroenterology;  Laterality: N/A;   ESOPHAGOGASTRODUODENOSCOPY (EGD) WITH PROPOFOL  N/A 07/04/2019   Procedure: ESOPHAGOGASTRODUODENOSCOPY (EGD) WITH PROPOFOL ;  Surgeon: Toledo, Ladell POUR, MD;  Location: [facility] ENDOSCOPY;  Service: Gastroenterology;  Laterality: N/A;   ESOPHAGOGASTRODUODENOSCOPY (EGD) WITH PROPOFOL  N/A 06/12/2023   Procedure: ESOPHAGOGASTRODUODENOSCOPY (EGD) WITH PROPOFOL ;  Surgeon: Maryruth Ole DASEN, MD;  Location: [facility] ENDOSCOPY;  Service: Endoscopy;  Laterality: N/A;  DM   TUBAL LIGATION  1981     Social History   reports that she has never smoked. She has never used smokeless tobacco. She reports that she does not drink alcohol and does not use drugs.   Family History   Her family history includes Breast cancer (age of onset: 37) in her mother; Cancer (age of  onset:  10) in her mother; Coronary artery disease in her father and mother; Diabetes in her mother and son; Stroke in her father.   Allergies Allergies  Allergen Reactions   Doxazosin Other (See Comments)    Cardura - cough   Doxycycline      Abdominal pain   Drug Class [Clindamycin/Lincomycin] Hives   Hydralazine  Hcl Other (See Comments)    gout   Metformin  And Related Swelling   Ciprofloxacin Other (See Comments)    Numbness in face   Iodine Other (See Comments)    NOT CT CONTRAST PER PT   Pioglitazone Other (See Comments)   Sacubitril-Valsartan Cough    Pt states that it gave her a cough and chest palpitations   Semaglutide Nausea Only   Sulfamethoxazole-Trimethoprim Hives   Augmentin [Amoxicillin-Pot Clavulanate] Diarrhea   Dulaglutide Nausea Only   Hctz [Hydrochlorothiazide] Other (See Comments)    Gout   Losartan Diarrhea   Poison Oak Extract Rash   Red Dye #40 (Allura Red) Rash   Home Medications  Prior to Admission medications   Medication Sig Start Date End Date Taking? Authorizing Provider  carvedilol  (COREG ) 12.5 MG tablet Take 1 tablet (12.5 mg total) by mouth 2 (two) times daily with a meal. 01/30/24  Yes Wieting, Richard, MD  gabapentin  (NEURONTIN ) 100 MG capsule Take 100 mg by mouth 2 (two) times daily. 09/28/23  Yes [provider]  insulin  glargine (LANTUS  SOLOSTAR) 100 UNIT/ML Solostar Pen Inject 20 Units into the skin at bedtime. 01/30/24 01/29/25 Yes Wieting, Richard, MD  isosorbide  mononitrate (IMDUR ) 30 MG 24 hr tablet Take 1 tablet (30 mg total) by mouth daily. 01/30/24  Yes Josette Ade, MD  Magnesium  Gluconate 550 MG TABS Take by mouth.   Yes [provider]  RIVAROXABAN  (XARELTO ) VTE STARTER PACK (15 & 20 MG) Follow package directions: Take one 15mg  tablet by mouth twice a day. On day 22, switch to one 20mg  tablet once a day. Take with food. 01/30/24  Yes Wieting, Richard, MD  spironolactone  (ALDACTONE ) 25 MG tablet Take 1/2 tablet (12.5 mg  total) by mouth daily. 01/30/24  Yes Josette Ade, MD  Vitamin D, Ergocalciferol, 2000 units CAPS Take 1 capsule by mouth daily.   Yes [provider]  Scheduled Meds:  vitamin C   500 mg Oral BID   carvedilol   6.25 mg Oral BID WC   Chlorhexidine  Gluconate Cloth  6 each Topical Q0600   feeding supplement (NEPRO CARB STEADY)  237 mL Oral BID BM   furosemide   80 mg Intravenous Q12H   gabapentin   100 mg Oral BID   insulin  aspart  0-6 Units Subcutaneous TID WC   lidocaine   1 patch Transdermal Q24H   multivitamin  1 tablet Oral QHS   multivitamin with minerals  1 tablet Oral Daily   mouth rinse  15 mL Mouth Rinse 4 times per day   pantoprazole  40 mg Oral Daily   sodium chloride  flush  3 mL Intravenous Q12H   Continuous Infusions:   PRN Meds:.acetaminophen , benzonatate , hydrALAZINE , HYDROcodone  bit-homatropine, hydrocortisone  cream, ipratropium-albuterol , menthol , metoCLOPramide (REGLAN) injection, mouth rinse, oxyCODONE , polyethylene glycol, prochlorperazine , promethazine , sodium chloride    Active Hospital Problem list   See systems below  Assessment & Plan:   #1 Acute Hypoxic Respiratory Failure secondary to anasarca with recurrent pleural effusion   #2 Acute on chronic HFrEF - TTE LVEF 30-35 (02/10/24) NYHA 3-4  History of Parainfluenza Virus 3 Infection ~ Resolved   #3 Recurrent Right Sided Pleural  Effusion  -s/p thoracentesis x 4 - thus far fluid is transudative with low LDH and normal glucose, lymphocyte/monocyte predominant consistent with chronic recurrent effusion most likely due to CHF - Supplemental O2 as needed to maintain sats 88-92% - Placed on BiPAP once arrived to ICU - High risk for intubation - Intermittent Chest X-ray & ABG as needed - Bronchodilators and Pulmicort nebs - Incentive spirometry - IR consulted for repeat thoracentesis s/t recurrent pleural effusion    #Cardiac arrest -Brief PEA arrests on 11/19 ~ Resolved #Elevated Troponin  likely demand iso above ~ Resolved #PAF / recent PE #Hypertension ECHO (11/19): LVEF 30-35%, global hypokinesis, moderately reduced RV function with mild enlargement and mild RV thickness.  - Continuous cardiac monitoring - Vasopressors to maintain MAP goal >65 ~ not currently requiring - Lasix  80mg  Q12h - Hold anticoagulation for now (Eliquis  5mg  twice daily) - Continue Carvedilol  - Hydralazine  10mg  Q4h prn for SBP >165 - EKG prn - Cardiology following; appreciate input  #AKI on CKD stage III - Volume Overload- cardiorenal syndrome #Hyponatremia #Hyperkalemia Renal US : negative for hydronephrosis - Strict I&O - Trend BMP, Mag and Phosphorous - Creatinine worsening at 2.05 with a BUN of 40 - Ensure adequate renal perfusion - Avoid nephrotoxic agents as able - ICU electrolyte replacement protocol - Pharmacy to assist with replacement as indicated - Received Lokelma  x 1 - Nephrology following; will receive HD tonight - Vascular will take her to the vascular lab tomorrow for PermaCath placement    #Type II Diabetes Mellitus Recent (HgbA1c 8.0%) - ICU hypo/hyperglycemia protocol  - SSI - CBG Q4h - Target CBG readings 140 to 180      Best practice:  Diet:  Oral Pain/Anxiety/Delirium protocol (if indicated): No VAP protocol (if indicated): Not indicated DVT prophylaxis: N/A GI prophylaxis: N/A Glucose control:  SSI Yes and Basal insulin  Yes Central venous access:  N/A Arterial line:  N/A Foley:  N/A Mobility:  bed rest  PT/OT consulted: Yes Code Status:  full code Disposition: Stepdown    Critical care provider statement:   Total critical care time: 33 minutes   Performed by: Parris MD   Critical care time was exclusive of separately billable procedures and treating other patients.   Critical care was necessary to treat or prevent imminent or life-threatening deterioration.   Critical care was time spent personally by me on the following activities:  development of treatment plan with patient and/or surrogate as well as nursing, discussions with consultants, evaluation of patient's response to treatment, examination of patient, obtaining history from patient or surrogate, ordering and performing treatments and interventions, ordering and review of laboratory studies, ordering and review of radiographic studies, pulse oximetry and re-evaluation of patient's condition.    Xyla Leisner, M.D.  Pulmonary & Critical Care Medicine

## 2024-02-24 NOTE — Progress Notes (Signed)
 OT Cancellation Note  Patient Details Name: Deborah Dawson MRN: [id number] DOB: 1951/02/01   Cancelled Treatment:    Reason Eval/Treat Not Completed: Other (comment) (new orders received, pt is OTF, OT will follow up as able)  Therisa Sheffield, OTD OTR/L  02/24/24, 12:33 PM

## 2024-02-24 NOTE — Plan of Care (Signed)
  Problem: Education: Goal: Ability to describe self-care measures that may prevent or decrease complications (Diabetes Survival Skills Education) will improve Outcome: Progressing   Problem: Coping: Goal: Ability to adjust to condition or change in health will improve Outcome: Progressing   Problem: Nutritional: Goal: Maintenance of adequate nutrition will improve Outcome: Progressing   Problem: Activity: Goal: Ability to tolerate increased activity will improve Outcome: Progressing   Problem: Respiratory: Goal: Ability to maintain a clear airway and adequate ventilation will improve Outcome: Progressing   Problem: Education: Goal: Knowledge of General Education information will improve Description: Including pain rating scale, medication(s)/side effects and non-pharmacologic comfort measures Outcome: Progressing

## 2024-02-24 NOTE — Progress Notes (Signed)
 [facility] CLINIC CARDIOLOGY PROGRESS NOTE       Patient ID: Deborah Dawson MRN: [id number] DOB/AGE: [birth year] 73 y.o.  Admit date: 02/09/2024 Referring Physician Inge Lecher, NP Primary Physician Sadie Manna, MD  Primary Cardiologist Dr. Ammon Reason for Consultation post-PEA arrest  HPI: Deborah Dawson is a 73 y.o. female  with a past medical history of chronic HFrEF, s/p CRT-P 07/2021, persistent atrial fibrillation, hypertension, type 2 diabetes who presented to the ED on 02/09/2024 for shortness of breath. CTA chest concerning for PNA. Overnight had PEA arrest x2 with ROSC. Cardiology was consulted for further evaluation.   Interval history: - Patient seen and examined this morning, resting in bed. - On 2L [place]. Reports less SOB today. - She denies any chest pain, palpitations. Denies nausea. - No significant LE edema on exam.   Review of systems complete and found to be negative unless listed above    Past Medical History:  Diagnosis Date   Allergic genetic state    Atrial fibrillation (HCC) 2010   Cardiomyopathy (HCC)    CHF (congestive heart failure) (HCC)    Diabetes mellitus without complication (HCC)    Dysrhythmia    Gout    H/O cardiac catheterization    Hypertension    Joint pain    Osteopenia    Presence of permanent cardiac pacemaker    Psoriasis     Past Surgical History:  Procedure Laterality Date   ABDOMINAL HYSTERECTOMY  1985   BREAST EXCISIONAL BIOPSY Left    negative years ago   BREAST SURGERY Left 1997   papilloma   CARDIAC CATHETERIZATION Left 08/01/2015   Procedure: Left Heart Cath and Coronary Angiography;  Surgeon: Vinie DELENA Jude, MD;  Location: [facility] INVASIVE CV LAB;  Service: Cardiovascular;  Laterality: Left;   CHOLECYSTECTOMY  1986   COLONOSCOPY WITH PROPOFOL  N/A 08/26/2017   Procedure: COLONOSCOPY WITH PROPOFOL ;  Surgeon: Toledo, Ladell POUR, MD;  Location: [facility] ENDOSCOPY;  Service: Gastroenterology;  Laterality: N/A;    ESOPHAGOGASTRODUODENOSCOPY (EGD) WITH PROPOFOL  N/A 08/26/2017   Procedure: ESOPHAGOGASTRODUODENOSCOPY (EGD) WITH PROPOFOL ;  Surgeon: Toledo, Ladell POUR, MD;  Location: [facility] ENDOSCOPY;  Service: Gastroenterology;  Laterality: N/A;   ESOPHAGOGASTRODUODENOSCOPY (EGD) WITH PROPOFOL  N/A 07/04/2019   Procedure: ESOPHAGOGASTRODUODENOSCOPY (EGD) WITH PROPOFOL ;  Surgeon: Toledo, Ladell POUR, MD;  Location: [facility] ENDOSCOPY;  Service: Gastroenterology;  Laterality: N/A;   ESOPHAGOGASTRODUODENOSCOPY (EGD) WITH PROPOFOL  N/A 06/12/2023   Procedure: ESOPHAGOGASTRODUODENOSCOPY (EGD) WITH PROPOFOL ;  Surgeon: Maryruth Ole DASEN, MD;  Location: [facility] ENDOSCOPY;  Service: Endoscopy;  Laterality: N/A;  DM   TUBAL LIGATION  1981    Medications Prior to Admission  Medication Sig Dispense Refill Last Dose/Taking   carvedilol  (COREG ) 12.5 MG tablet Take 1 tablet (12.5 mg total) by mouth 2 (two) times daily with a meal. 60 tablet 0 02/09/2024 Morning   gabapentin  (NEURONTIN ) 100 MG capsule Take 100 mg by mouth 2 (two) times daily.   02/09/2024 Morning   insulin  glargine (LANTUS  SOLOSTAR) 100 UNIT/ML Solostar Pen Inject 20 Units into the skin at bedtime.   02/08/2024   isosorbide  mononitrate (IMDUR ) 30 MG 24 hr tablet Take 1 tablet (30 mg total) by mouth daily. 30 tablet 0 02/09/2024   Magnesium  Gluconate 550 MG TABS Take by mouth.   Taking   RIVAROXABAN  (XARELTO ) VTE STARTER PACK (15 & 20 MG) Follow package directions: Take one 15mg  tablet by mouth twice a day. On day 22, switch to one 20mg  tablet once a day. Take with food. 51 each  0 02/09/2024 Morning   spironolactone  (ALDACTONE ) 25 MG tablet Take 1/2 tablet (12.5 mg total) by mouth daily. 30 tablet 0 02/09/2024   Vitamin D, Ergocalciferol, 2000 units CAPS Take 1 capsule by mouth daily.   Taking   Social History   Socioeconomic History   Marital status: Married    Spouse name: Not on file   Number of children: Not on file   Years of education: Not on file   Highest  education level: Not on file  Occupational History   Not on file  Tobacco Use   Smoking status: Never   Smokeless tobacco: Never  Vaping Use   Vaping status: Never Used  Substance and Sexual Activity   Alcohol use: No    Alcohol/week: 0.0 standard drinks of alcohol   Drug use: No   Sexual activity: Not on file    Comment: Married   Other Topics Concern   Not on file  Social History Narrative   Not on file   Social Drivers of Health   Financial Resource Strain: Low Risk  (11/02/2023)   Received from [facility]   Overall Financial Resource Strain (CARDIA)    Difficulty of Paying Living Expenses: Not hard at all  Food Insecurity: Patient Unable To Answer (02/10/2024)   Hunger Vital Sign    Worried About Running Out of Food in the Last Year: Patient unable to answer    Ran Out of Food in the Last Year: Patient unable to answer  Transportation Needs: Patient Unable To Answer (02/10/2024)   PRAPARE - Transportation    Lack of Transportation (Medical): Patient unable to answer    Lack of Transportation (Non-Medical): Patient unable to answer  Physical Activity: Not on file  Stress: Not on file  Social Connections: Patient Unable To Answer (02/10/2024)   Social Connection and Isolation Panel    Frequency of Communication with Friends and Family: Patient unable to answer    Frequency of Social Gatherings with Friends and Family: Patient unable to answer    Attends Religious Services: Patient unable to answer    Active Member of Clubs or Organizations: Patient unable to answer    Attends Banker Meetings: Patient unable to answer    Marital Status: Patient unable to answer  Intimate Partner Violence: Patient Unable To Answer (02/10/2024)   Humiliation, Afraid, Rape, and Kick questionnaire    Fear of Current or Ex-Partner: Patient unable to answer    Emotionally Abused: Patient unable to answer    Physically Abused: Patient unable to answer     Sexually Abused: Patient unable to answer    Family History  Problem Relation Age of Onset   Cancer Mother 37       breast   Diabetes Mother    Breast cancer Mother 51   Coronary artery disease Mother    Stroke Father    Coronary artery disease Father    Diabetes Son      Vitals:   02/24/24 0420 02/24/24 0500 02/24/24 0553 02/24/24 0800  BP: 124/89  126/80 125/77  Pulse: 86 82 93 78  Resp: (!) 21 18 17 19   Temp:    (!) 97.5 F (36.4 C)  TempSrc:    Oral  SpO2: 99% 99% 99% 100%  Weight: 68.3 kg     Height:        PHYSICAL EXAM General: Chronically ill-appearing elderly female.  No acute distress. HEENT: Normocephalic and atraumatic. Neck: No JVD.  Lungs: Normal  respiratory effort on 2 L [place]. Mild bibasilar crackles. Heart: HRRR. Normal S1 and S2 without gallops or murmurs.  Abdomen: Non-distended appearing.  Msk: Normal strength and tone for age. Extremities: Warm and well perfused. No clubbing, cyanosis.  No edema.  Neuro: Alert and oriented X 3. Psych: Answers questions appropriately.   Labs: Basic Metabolic Panel: Recent Labs    02/22/24 0215 02/23/24 0000 02/23/24 0518 02/24/24 0820  NA 125*   < > 128* 130*  K 5.4*   < > 4.2 4.3  CL 89*   < > 92* 92*  CO2 26   < > 25 27  GLUCOSE 176*   < > 102* 141*  BUN 40*   < > 28* 35*  CREATININE 2.05*   < > 1.57* 1.80*  CALCIUM  8.8*   < > 8.6* 8.8*  MG 2.4  --  2.2  --   PHOS 4.8*  --  3.9 4.2   < > = values in this interval not displayed.   Liver Function Tests: Recent Labs    02/23/24 0518 02/24/24 0820  AST 34 30  ALT 45* 37  ALKPHOS 75 74  BILITOT 0.9 1.1  PROT 6.5 6.3*  ALBUMIN 3.3* 3.2*    No results for input(s): LIPASE, AMYLASE in the last 72 hours. CBC: Recent Labs    02/22/24 0215 02/23/24 0518 02/24/24 0820  WBC 8.3 8.1 8.0  NEUTROABS 5.6 5.5  --   HGB 13.2 13.0 12.9  HCT 40.4 39.2 38.6  MCV 88.6 87.5 86.4  PLT 180 178 194   Cardiac Enzymes: No results for input(s):  CKTOTAL, CKMB, CKMBINDEX, TROPONINIHS in the last 72 hours. BNP: No results for input(s): BNP in the last 72 hours. D-Dimer: No results for input(s): DDIMER in the last 72 hours. Hemoglobin A1C: No results for input(s): HGBA1C in the last 72 hours. Fasting Lipid Panel: No results for input(s): CHOL, HDL, LDLCALC, TRIG, CHOLHDL, LDLDIRECT in the last 72 hours. Thyroid Function Tests: No results for input(s): TSH, T4TOTAL, T3FREE, THYROIDAB in the last 72 hours.  Invalid input(s): FREET3 Anemia Panel: No results for input(s): VITAMINB12, FOLATE, FERRITIN, TIBC, IRON, RETICCTPCT in the last 72 hours.   Radiology: DG Abd 1 View Result Date: 02/10/2024 EXAM: 1 VIEW XRAY OF THE ABDOMEN 02/10/2024 04:44:00 AM COMPARISON: 02/10/2024 CLINICAL HISTORY: Encounter for imaging study to confirm orogastric (OG) tube placement FINDINGS: LINES, TUBES AND DEVICES: Gastric tube in place with side hole at the level of the gastric body. Cardiac pacemaker noted. BOWEL: Nonobstructive bowel gas pattern. SOFT TISSUES: Surgical clips in the right upper quadrant. No opaque urinary calculi. BONES: No acute osseous abnormality. LUNG BASES: Interstitial and patchy airspace opacities in the partially visualized lung bases, probably atelectasis. IMPRESSION: 1. Satisfactory enteric tube placement into the stomach. Electronically signed by: Helayne Hurst MD 02/10/2024 05:25 AM EST RP Workstation: [id number]   DG Abd 1 View Result Date: 02/10/2024 EXAM: 1 VIEW XRAY OF THE ABDOMEN 02/10/2024 04:05:00 AM COMPARISON: None available. CLINICAL HISTORY: Encounter for imaging study to confirm orogastric (OG) tube placement. FINDINGS: LINES, TUBES AND DEVICES: Enteric tube in place with tip and side port overlying the expected region of the gastric lumen. Cardiac pacemaker noted. BOWEL: Nonobstructive bowel gas pattern. SOFT TISSUES: No opaque urinary calculi. BONES: No acute osseous  abnormality. LUNG BASES: Patchy airspace opacities in the partially visualized lung bases. IMPRESSION: 1. Enteric tube in appropriate position with tip and side port overlying the expected region of the gastric lumen. 2.  Patchy airspace opacities in the partially visualized lung bases. Electronically signed by: Dorethia Molt MD 02/10/2024 04:25 AM EST RP Workstation: [id number]   CT CHEST WO CONTRAST Result Date: 02/10/2024 EXAM: CT CHEST WITHOUT CONTRAST 02/10/2024 01:28:16 AM TECHNIQUE: CT of the chest was performed without the administration of intravenous contrast. Multiplanar reformatted images are provided for review. Automated exposure control, iterative reconstruction, and/or weight based adjustment of the mA/kV was utilized to reduce the radiation dose to as low as reasonably achievable. COMPARISON: Vertebrae 18 to 2025. CLINICAL HISTORY: CXR post cardiac arrest: predominantly within the right upper lobe, possibly representing acute pneumonic consolidation, pulmonary hemorrhage, or asymmetric alveolar pulmonary edema as could be seen with acute mitral valve regurgitation. FINDINGS: MEDIASTINUM: Heart: Mild coronary artery calcification. Mild cardiomegaly. A left subclavian dual-lead pacemaker is identified with leads within the left ventricular venous outflow and right ventricle toward the apex. No pericardial effusion. Vessels: The central pulmonary arteries are enlarged in keeping with changes of pulmonary arterial hypertension. Mild atherosclerotic calcification within the thoracic aorta. No aortic aneurysm. Airways: The central airways are clear. Interval endotracheal tube placement attempts 2.6 cm above the carina. Other: Interval nasogastric tube placement extending into the stomach beyond the margin of the exam. Visualized thyroid is unremarkable. LYMPH NODES: Extensive mediastinal adenopathy is again identified and appears stable, nonspecific. This may be reactive, inflammatory as can be seen  with conditions such as sarcoidosis, or lymphoproliferative in etiology. Comparison with interval examinations, if available, would be helpful in determining stability. If none are available, follow-up CT or PET CT examination would be helpful in 3 months to demonstrate stability or resolution. LUNGS AND PLEURA: Extensive asymmetric pulmonary ground-glass infiltrate, multiple consolidation, and smooth interlobular septal thickening, asymmetrically more severe within the right upper lobe. Given its relatively rapid development since prior examination, the findings are most suggestive of asymmetric pulmonary edema, though pulmonary hemorrhage could appear similarly. Extensive pneumonic infiltrate or changes of ARDS are considered less likely given the relatively rapid development. Moderate to large right pleural effusion again noted with compressive atelectasis of the right lower lobe. No pneumothorax. SOFT TISSUES/BONES: Acute fracture of the left seventh rib anterolaterally with minimal displacement. No other acute bone abnormality identified. Osseous structures are otherwise age appropriate. UPPER ABDOMEN: Limited images of the upper abdomen demonstrates no acute abnormality. IMPRESSION: 1. Extensive asymmetric pulmonary ground-glass infiltrate, multifocal consolidation, and smooth interlobular septal thickening, greatest in the right upper lobe, most suggestive of asymmetric pulmonary edema given rapid interval development; acute pulmonary hemorrhage is a differential consideration. Extensive pneumonic infiltrate or ARDS considered less likely. 2. Moderate to large right pleural effusion with compressive atelectasis of the right lower lobe. 3. Acute minimally displaced fracture of the left anterolateral seventh rib. 4. Thoracic adenopathy as outlined above. This is nonspecific, but may be reactive, inflammatory as can be seen with conditions such as sarcoidosis, or lymphoproliferative in etiology. Comparison with  interval examinations, if available, be helpful in determining stability. If none are available, follow-up CT or PET CT examination would be helpful in 3 months to demonstrate stability or resolution. 5. Findings consistent with pulmonary arterial hypertension (enlarged central pulmonary arteries). 6. Mild coronary artery calcification and mild cardiomegaly. Electronically signed by: Dorethia Molt MD 02/10/2024 01:46 AM EST RP Workstation: [id number]   CT HEAD WO CONTRAST ( ) Result Date: 02/10/2024 EXAM: CT HEAD WITHOUT CONTRAST 02/10/2024 01:28:16 AM TECHNIQUE: CT of the head was performed without the administration of intravenous contrast. Automated exposure control, iterative reconstruction, and/or weight based adjustment of  the mA/kV was utilized to reduce the radiation dose to as low as reasonably achievable. COMPARISON: None available. CLINICAL HISTORY: Mental status change, unknown cause. FINDINGS: BRAIN AND VENTRICLES: Cerebral ventricle sizes are concordant with the degree of cerebral volume loss.No acute hemorrhage. No evidence of acute infarct. No hydrocephalus. No extra-axial collection. No mass effect or midline shift. Residual contrast material within intracranial vasculature from recent contrast administration. ORBITS: Bilateral lens replacement. SINUSES: No acute abnormality. SOFT TISSUES AND SKULL: Partially imaged endotracheal tube in place. No acute soft tissue abnormality. No skull fracture. IMPRESSION: 1. No acute intracranial abnormality. Electronically signed by: Morgane Naveau MD 02/10/2024 01:40 AM EST RP Workstation: [id number]   DG Chest Portable 1 View Result Date: 02/10/2024 EXAM: 1 VIEW(S) XRAY OF THE CHEST 02/10/2024 12:12:00 AM COMPARISON: 02/09/2024 CLINICAL HISTORY: post intubation FINDINGS: LINES, TUBES AND DEVICES: Endotracheal tube in place with tip 2.2 cm above the carina. Enteric tube in place, courses below the diaphragm. LUNGS AND PLEURA: Interval development of  extensive airspace infiltrate within the right lung, predominantly within the right upper lobe. This may represent acute pneumonic consolidation, pulmonary hemorrhage or asymmetric alveolar pulmonary edema as could be seen with acute mitral valve regurgitation. Known right pleural effusion seen on CT examination of 02/09/2024 is not well appreciated on this supine radiograph. No definite pneumothorax. HEART AND MEDIASTINUM: Similar mild cardiomegaly. Left chest cardiac pacemaker noted. Aortic arch atherosclerosis. BONES AND SOFT TISSUES: No acute osseous abnormality. IMPRESSION: 1. Interval development of extensive airspace infiltrate within the right lung, predominantly within the right upper lobe, possibly representing acute pneumonic consolidation, pulmonary hemorrhage, or asymmetric alveolar pulmonary edema as could be seen with acute mitral valve regurgitation. 2. Known right pleural effusion seen on CT examination of 02/09/2024 is not well appreciated on this supine radiograph. No definite pneumothorax. Electronically signed by: Dorethia Molt MD 02/10/2024 12:22 AM EST RP Workstation: [id number]   CT Angio Chest PE W and/or Wo Contrast Result Date: 02/09/2024 EXAM: CTA CHEST 02/09/2024 06:29:20 PM TECHNIQUE: CTA of the chest was performed without and with the administration of 75 mL of iohexol  (OMNIPAQUE ) 350 MG/ML injection. Multiplanar reformatted images are provided for review. MIP images are provided for review. Automated exposure control, iterative reconstruction, and/or weight based adjustment of the mA/kV was utilized to reduce the radiation dose to as low as reasonably achievable. COMPARISON: None available. CLINICAL HISTORY: Pulmonary embolism (PE) suspected, low to intermediate prob, positive D-dimer. FINDINGS: PULMONARY ARTERIES: Pulmonary arteries are adequately opacified for evaluation. No acute pulmonary embolus. Main pulmonary artery is normal in caliber. MEDIASTINUM: The heart and  pericardium demonstrate no acute abnormality. There is no acute abnormality of the thoracic aorta. LYMPH NODES: Right hilar lymphadenopathy with, as an example, a 1.2 cm lymph node (5:54). Left hilar lymphadenopathy with, as an example, a 1.2 cm lymph node (5 x 15 x 4 mm). Mediastinal lymphadenopathy with a right paratracheal 1.1 cm lymph node (5.32). No axillary lymphadenopathy. LUNGS AND PLEURA: At least a small volume right pleural effusion. No left pleural effusion. No pneumothorax. Basal atelectasis of the right lower lobe. Diffuse bronchial wall thickening with mucous plugging within the right lower lobe. Mucous plugging within the left lower lobe to a lesser extent. Question of developing small consolidation within the right lower lobe (6:88) with associated bronchial plugging. The trachea and mainstem bronchi are patent. Expiratory phase respiration. UPPER ABDOMEN: Limited images of the upper abdomen are unremarkable. SOFT TISSUES AND BONES: Left chest wall 2 lead cardiac device. No acute bone or  soft tissue abnormality. IMPRESSION: 1. No pulmonary embolism. 2. Diffuse bronchial wall thickening with mucous plugging, greater in the right lower lobe, with probable developing small right lower lobe consolidation. 3. Small right pleural effusion. 4. Right hilar, left hilar, and mediastinal lymphadenopathy. Likely reactive in etiology. Recommend attention on follow-up. Electronically signed by: Morgane Naveau MD 02/09/2024 07:12 PM EST RP Workstation: [id number]   DG Chest 2 View Result Date: 02/09/2024 CLINICAL DATA:  Shortness of breath and pleural effusion. EXAM: CHEST - 2 VIEW COMPARISON:  Chest x-ray 01/29/2024.  Chest CT 01/24/2024. FINDINGS: There is a small right pleural effusion. Left lung is clear. No pneumothorax or focal lung infiltrate. The cardiac silhouette is enlarged. Left-sided pacemaker is again seen. No acute fractures are identified. IMPRESSION: 1. Small right pleural effusion. 2.  Cardiomegaly. Electronically Signed   By: Greig Pique M.D.   On: 02/09/2024 15:56   US  THORACENTESIS ASP PLEURAL SPACE W/IMG GUIDE Result Date: 01/29/2024 INDICATION: 73 year old female presents with shortness of breath, cough, and recurrent right pleural effusion. Received request for diagnostic and therapeutic thoracentesis. EXAM: ULTRASOUND GUIDED RIGHT THORACENTESIS MEDICATIONS: 10 mL 1% lidocaine  COMPLICATIONS: None immediate. PROCEDURE: An ultrasound guided thoracentesis was thoroughly discussed with the patient and questions answered. The benefits, risks, alternatives and complications were also discussed. The patient understands and wishes to proceed with the procedure. Written consent was obtained. Ultrasound was performed to localize and mark an adequate pocket of fluid in the right chest. The area was then prepped and draped in the normal sterile fashion. 1% lidocaine  was used for local anesthesia. Under ultrasound guidance a 6 Fr Safe-T-Centesis catheter was introduced. Thoracentesis was performed. The catheter was removed and a dressing applied. FINDINGS: A total of approximately 700 mL of amber fluid was removed. Samples were sent to the laboratory as requested by the clinical team. IMPRESSION: Successful ultrasound guided right thoracentesis yielding 700 mL of pleural fluid. Performed by: Kristi Davenport, NP Electronically Signed   By: Cordella Banner   On: 01/29/2024 15:39   DG Chest Port 1 View Result Date: 01/29/2024 EXAM: 1 VIEW(S) XRAY OF THE CHEST 01/29/2024 11:40:00 AM COMPARISON: 01/29/2024 CLINICAL HISTORY: Recurrent right pleural effusion FINDINGS: LINES, TUBES AND DEVICES: Left chest pacemaker with leads terminating in the right atrium and coronary sinus. LUNGS AND PLEURA: No focal pulmonary opacity. No pulmonary edema. Near complete resolution of the right pleural effusion with minimal blunting of the right costophrenic angle. Improved aeration of right lung base. No  pneumothorax. HEART AND MEDIASTINUM: Cardiomegaly. BONES AND SOFT TISSUES: No acute osseous abnormality. Cholecystectomy clips. IMPRESSION: 1. Near complete resolution of the right pleural effusion with minimal residual blunting of the right costophrenic angle and improved aeration of the right lung base. Electronically signed by: Rogelia Myers MD 01/29/2024 12:53 PM EST RP Workstation: [id number]   DG Chest 2 View Result Date: 01/29/2024 EXAM: 2 VIEW(S) XRAY OF THE CHEST 01/29/2024 06:46:00 AM COMPARISON: 01/26/2024 CLINICAL HISTORY: Pleural effusion on right. FINDINGS: LINES, TUBES AND DEVICES: Stable 2 lead left chest pacemaker. LUNGS AND PLEURA: Increased right base airspace disease. Small right pleural effusion, mildly increased. No pulmonary edema. No pneumothorax. HEART AND MEDIASTINUM: No acute abnormality of the cardiac and mediastinal silhouettes. BONES AND SOFT TISSUES: Upper abdominal surgical clips noted. No acute osseous abnormality. IMPRESSION: 1. Redeveloping small right pleural effusion with increased right base airspace disease, most likely atelectasis. 2. No pneumothorax. Electronically signed by: Rockey Kilts MD 01/29/2024 11:11 AM EST RP Workstation: [id number]   US  THORACENTESIS  ASP PLEURAL SPACE W/IMG GUIDE Result Date: 01/26/2024 INDICATION: 73 year old female presents with shortness of breath. Received request for diagnostic and therapeutic thoracentesis. EXAM: ULTRASOUND GUIDED RIGHT THORACENTESIS MEDICATIONS: 8 mL 1% lidocaine  COMPLICATIONS: None immediate. PROCEDURE: An ultrasound guided thoracentesis was thoroughly discussed with the patient and questions answered. The benefits, risks, alternatives and complications were also discussed. The patient understands and wishes to proceed with the procedure. Written consent was obtained. Ultrasound was performed to localize and mark an adequate pocket of fluid in the right chest. The area was then prepped and draped in the normal sterile  fashion. 1% lidocaine  was used for local anesthesia. Under ultrasound guidance a 6 Fr Safe-T-Centesis catheter was introduced. Thoracentesis was performed. The catheter was removed and a dressing applied. FINDINGS: A total of approximately 900 mL of clear yellow fluid was removed. Samples were sent to the laboratory as requested by the clinical team. IMPRESSION: Successful ultrasound guided right thoracentesis yielding 900 mL of pleural fluid. Performed by: Rayfield Buff, NP Electronically Signed   By: Cordella Banner   On: 01/26/2024 14:49   DG Chest Port 1 View Result Date: 01/26/2024 EXAM: 1 VIEW(S) XRAY OF THE CHEST 01/26/2024 01:02:00 PM COMPARISON: 01/25/2024 CLINICAL HISTORY: SOB (shortness of breath); Status post thoracentesis FINDINGS: LUNGS AND PLEURA: Evacuation of right pleural effusion with a small amount of residual fluid. Minimal overlying atelectasis. The left lung remains clear No post-procedural pneumothorax. No pulmonary edema. No pneumothorax. HEART AND MEDIASTINUM: Cardiomegaly with left subclavian approach cardiac rhythm maintenance device. Aortic arch atherosclerosis. BONES AND SOFT TISSUES: No acute osseous abnormality. IMPRESSION: 1. Evacuation of right pleural effusion with a small residual effusion and minimal overlying atelectasis. No pneumothorax. 2. No post-procedure or pneumothorax. Electronically signed by: Maude Stammer MD 01/26/2024 01:38 PM EST RP Workstation: [id number]   DG Chest Port 1 View Result Date: 01/25/2024 EXAM: 1 VIEW(S) XRAY OF THE CHEST 01/25/2024 09:41:07 AM COMPARISON: 01/24/2024 CLINICAL HISTORY: Shortness of breath FINDINGS: LUNGS AND PLEURA: Small right pleural effusion. Increased right lower and developing inferior right upper lobe airspace disease. Diffuse interstitial thickening. No pneumothorax. HEART AND MEDIASTINUM: Left subclavian dual lead pacemaker unchanged. Cardiomegaly. BONES AND SOFT TISSUES: No acute osseous abnormality. IMPRESSION: 1.  Persistent small right pleural effusion with increased right lower and developing inferior right upper airspace disease, favoring pneumonia 2. Cardiomegaly with increased interstitial thickening, favoring mild pulmonary edema Electronically signed by: Rockey Kilts MD 01/25/2024 05:05 PM EST RP Workstation: [id number]   US  Venous Img Lower Bilateral (DVT) Result Date: 01/24/2024 CLINICAL DATA:  Pulmonary embolism. EXAM: BILATERAL LOWER EXTREMITY VENOUS DOPPLER ULTRASOUND TECHNIQUE: Gray-scale sonography with compression, as well as color and duplex ultrasound, were performed to evaluate the deep venous system(s) from the level of the common femoral vein through the popliteal and proximal calf veins. COMPARISON:  None Available. FINDINGS: VENOUS Normal compressibility of the common femoral, superficial femoral, and popliteal veins, as well as the visualized calf veins. Visualized portions of profunda femoral vein and great saphenous vein unremarkable. No filling defects to suggest DVT on grayscale or color Doppler imaging. Doppler waveforms show normal direction of venous flow, normal respiratory plasticity and response to augmentation. Limited views of the contralateral common femoral vein are unremarkable. OTHER None. Limitations: none IMPRESSION: Negative. Electronically Signed   By: Leita Birmingham M.D.   On: 01/24/2024 14:19   CT Angio Chest PE W and/or Wo Contrast Addendum Date: 01/24/2024 ** ADDENDUM #1 ** ADDENDUM: The above findings were discussed with Dr. Levander at 9:53  am 01/24/24. ---------------------------------------------------- Electronically signed by: Evalene Coho MD 01/24/2024 10:49 AM EST RP Workstation: [id number]   Result Date: 01/24/2024 ** ORIGINAL REPORT ** EXAM: CTA of the Chest with contrast for PE 01/24/2024 09:25:18 AM TECHNIQUE: CTA of the chest was performed without and with the administration of 75 mL of iohexol  (OMNIPAQUE ) 350 MG/ML injection. Multiplanar reformatted images are  provided for review. MIP images are provided for review. Automated exposure control, iterative reconstruction, and/or weight based adjustment of the mA/kV was utilized to reduce the radiation dose to as low as reasonably achievable. COMPARISON: PA and lateral radiographs of the chest dated 01/24/2024. CLINICAL HISTORY: Pulmonary embolism (PE) suspected, low to intermediate prob, positive D-dimer. FINDINGS: PULMONARY ARTERIES: Pulmonary arteries are adequately opacified for evaluation. There is nonocclusive thromboembolic disease present within the distal right pulmonary artery and within the right lower lobe artery and anterior segmental branch of the right lower lobe pulmonary artery. There is also thromboembolus within the posterior segmental artery. There is no definite thromboembolic disease demonstrated in the left lung. Main pulmonary artery is normal in caliber. MEDIASTINUM: The heart is enlarged. There is no evidence of right ventricular strain. There is mild calcific coronary artery disease. The thoracic aorta demonstrates mild-to-moderate calcific atheromatous disease. The ascending thoracic aorta measures approximately 3.3 cm in diameter. LYMPH NODES: There are numerous enlarged mediastinal lymph nodes. There is also right hilar lymphadenopathy. No axillary lymphadenopathy. LUNGS AND PLEURA: There is mild dependent atelectasis within the right lung. There is a moderate right-sided pleural effusion. No pneumothorax. No focal consolidation or pulmonary edema. UPPER ABDOMEN: Limited images of the upper abdomen are unremarkable. SOFT TISSUES AND BONES: No acute bone or soft tissue abnormality. IMPRESSION: 1. Nonocclusive pulmonary emboli involving the distal right pulmonary artery, right lower lobe artery, and anterior and posterior segmental branches; no left-sided PE identified. No CT evidence of right heart strain. 2. Moderate right pleural effusion. 3. Cardiomegaly with mild coronary artery calcifications  and mild-to-moderate thoracic aortic atherosclerosis. 4. Enlarged mediastinal and right hilar lymph nodes; recommend clinical correlation and follow-up per oncologic/infectious/inflammatory considerations. Electronically signed by: Evalene Coho MD 01/24/2024 09:51 AM EST RP Workstation: [id number]   DG Chest 2 View Result Date: 01/24/2024 CLINICAL DATA:  Shortness of breath. EXAM: CHEST - 2 VIEW COMPARISON:  08/08/2020 FINDINGS: Mild cardiomegaly, with dual lead pacemaker in place. Small to moderate right pleural effusion. Right basilar atelectasis versus infiltrate. IMPRESSION: Small to moderate right pleural effusion, with right basilar atelectasis versus infiltrate. Electronically Signed   By: Norleen DELENA Kil M.D.   On: 01/24/2024 08:29    ECHO 02/10/24: 1. TDS.  2. Left ventricular ejection fraction, by estimation, is 30 to 35%. The left ventricle has moderately decreased function. The left ventricle demonstrates global hypokinesis. Left ventricular diastolic function could  not be evaluated.  3. Right ventricular systolic function is moderately reduced. The right  ventricular size is mildly enlarged. Mildly increased right ventricular  wall thickness.  4. Left atrial size was mildly dilated.  5. Right atrial size was mildly dilated.  6. The mitral valve is normal in structure. No evidence of mitral valve  regurgitation.  7. The aortic valve was not well visualized. Aortic valve regurgitation  is not visualized.   TELEMETRY (personally reviewed): VP rate 90-100s, PVCs  EKG (personally reviewed): VP rate 69 bpm  Data reviewed by me 02/24/2024: last 24h vitals tele labs imaging I/O ED provider note, admission H&P, hospitalist progress note, PCCM notes, nephrology notes, vascular notes  Principal Problem:  CAP (community acquired pneumonia) Active Problems:   Encounter for dialysis and dialysis catheter care   Acute renal failure   Cardiac arrest (HCC)   Pressure injury of skin     ASSESSMENT AND PLAN:  Deborah Dawson is a 73 y.o. female  with a past medical history of chronic HFrEF, s/p CRT-P 07/2021, persistent atrial fibrillation, hypertension, type 2 diabetes who presented to the ED on 02/09/2024 for shortness of breath. CTA chest concerning for PNA. Overnight had PEA arrest x2 with ROSC. Cardiology was consulted for further evaluation.   # PEA arrest # Pneumonia # Acute on chronic HFrEF # S/p CRT-P 07/2021 # Persistent atrial fibrillation Patient initially presented with shortness of breath, concern for pneumonia on CTA chest.  Overnight last night she had PEA arrest with ROSC then shortly afterwards arrested again.  Intubated in the ED.  Troponins mildly elevated and flat trending.  BNP significantly elevated on admission at 15,900. EKG without acute ischemic changes.  Echo this admission with EF 30-35%, global hypokinesis, moderately reduced RV function with mild enlargement and mild RV thicknes. S/p thoracentesis 11/26 (1.2L removed), 12/2 (1.5L removed) -Nephro managing need for HD. Appreciate their recommendations. Going for permcath placement today. -IV lasix  80 mg BID ordered but with minimal UOP. -Continue carvedilol  6.25 mg twice daily. Consider reintroducing other GDMT as able. -Eliquis  currently held. -Mildly elevated and flat troponins most consistent with demand/supply mismatch and not ACS.  -Further management per PCCM.  -Discussed case with advanced HF at [facility] yesterday who felt she would not be a candidate for advanced therapies. Will discuss with [facility] today for possible transfer for evaluation.   This patient's plan of care was discussed and created with Dr. Florencio and she is in agreement.  Signed: Danita Bloch, PA-C  02/24/2024, 9:27 AM [facility]

## 2024-02-24 NOTE — Discharge Summary (Signed)
 Physician Discharge Summary   Patient: Deborah Dawson MRN: [id number] DOB: 1950/11/06  Admit date:     02/09/2024  Discharge date: 02/24/24  Discharge Physician: Duffy Al-Sultani   PCP: Sadie Manna, MD    Discharge Diagnoses: Principal Problem:   CAP (community acquired pneumonia) Active Problems:   Cardiac arrest (HCC)   Acute on chronic systolic CHF (congestive heart failure) (HCC)   Acute renal failure   Recurrent right pleural effusion   Acute hypoxic respiratory failure (HCC)   Paroxysmal atrial fibrillation (HCC)   Uncontrolled type 2 diabetes mellitus with hyperglycemia, with long-term current use of insulin  (HCC)   Encounter for dialysis and dialysis catheter care   Primary hypertension   Pressure injury of skin   CKD stage 3b, GFR 30-44 ml/min (HCC)   Hyponatremia   Hyperkalemia   Elevated troponin   Hospital Course: Patient is a 73 year old female with PMHx of chronic HFrEF, s/p PPM, LBBB, PAF on Xarelto , HTN, T2DM, CKD stage IIIa, who was recently hospitalized (11/2-11/8) for acute on chronic CHF exacerbation, right pleural effusion (s/p thoracentesis x 2), and small PEs (was switched from Eliquis  to Xarelto  at the time), who presented on 11/18 with progressively worsening shortness of breath since 11/13.  She also reported chronic cough with clear, yellowish sputum.  Outpatient CXR on 11/17 showed improved aeration but persistent small pleural effusion.  She denied any other symptoms and reported taking medications as prescribed.  In the ED, initial vitals showed hypertension to 160/106, tachypnea with RR 24, SpO2 95% on RA.  She was placed on 2 L nasal cannula.  Lab work showed an unremarkable CBC, BMP showed sodium 132, potassium 5.9, bicarb 21, BUN 35, creatinine 1.24.  High-sensitivity troponin 49.  Lactic acid 0.7.  RPP was positive for parainfluenza virus 3.  UDS negative.  proBNP was elevated to 15,914.  Initial CXR showed small right pleural effusion with  cardiomegaly.  CTA chest was negative for PE but demonstrated diffuse bronchial wall thickening with mucous plugging, probable developing right lower lobe consolidation, and a small right pleural effusion.  EKG showed irregular rhythm, rate 117, consistent with a regular ventricular tachycardia in the setting of chronic LBBB; mild STE in V1-V3 not meeting STEMI criteria.    The patient received IV ceftriaxone  and azithromycin  for suspected CAP and was admitted by the hospitalist service.  While awaiting inpatient bed assignment, the patient reported nausea, followed by sudden circumoral cyanosis and unresponsiveness.  She was found pulseless in PEA.  1 round of CPR and ACLS resulted in ROSC.  She remained obtunded and was emergently intubated for airway protection.  Approximately 20 minutes later, she again became pulseless in PEA, and achieved ROSC after 1 ACLS cycle.  Post ROSC imaging with noncontrast CT chest revealed extensive asymmetric pulmonary edema (R >L), multifocal consolidation, moderate-large right pleural effusion, and features consistent with pulmonary hypertension.  PCCM was consulted for ongoing postcardiac arrest care and the patient was transferred to the ICU. TTE on (11/19) shows LVEF 30-35%, global hypokinesis, moderately reduced RV function with mild enlargement and mild RV thickness she was extubated on 11/20 and transferred back to the medical floor on 11/21.  Her stay was further complicated by severe AKI, metabolic acidosis, volume overload with proBNP >35,000 on 11/22.  Patient was started on hemodialysis on 11/23.  The patient underwent attempted PermCath placement on 11/25.  During PermCath placement, she received Versed  and fentanyl , and subsequently underwent respiratory arrest.  She required Narcan  before returning to  spontaneous respirations and then was transferred back to the ICU.  She was noted to become increasingly dyspneic on 11/27 with CXR revealing worsening right-sided  pleural effusion, for which she underwent thoracentesis with removal of 1.2 L of fluid.  She underwent dialysis on 11/28 with significant improvement in symptoms.  On 12/1, she was again noted to become progressively short of breath throughout the day, with CXR showing reaccumulation of right pleural effusion.  She was noted to maintain SpO2 >90% on 2 L but was exhibiting increased work of breathing, and was unresponsive to IV Lasix  with minimal urine output.  She was placed on BiPAP and transferred to the stepdown unit.  She also underwent dialysis on the night of 2/1 with removal of 2 L UF.  She underwent right-sided thoracentesis on 12/2 with removal of 1.55 L of serosanguineous fluid.   Cardiology was following the patient and engaged the Advanced Heart Failure team at [facility] who indicated the patient would not be a candidate for advanced therapies. Cardiology engaged [facility] Failure team and the patient was accepted for transfer to [facility] by Dr. Nanci Blanch. She underwent successful PermCath placement on 12/3 and underwent HD on 12/3 immediately prior to discharge with removal of 1.3L UF, which she tolerated well. However, the patient was noted to have swelling at the PermCath site after starting HD and her session was completed using her femoral HD catheter. At the time of discharge, her femoral HD catheter will be left in place until patient's PermCath is evaluated at [facility].  The patient was hemodynamically stable and appropriate for transfer to [facility].  Benefits and risks of transfer were discussed with the patient, who verbalized understanding and was in agreement with the plan for transfer to [facility].   Consultants: Cardiology, nephrology Procedures performed: Thoracentesis 11/26, thoracentesis 12/2, permacath placement 12/3 Disposition: Transfer to [facility] Diet recommendation:  Diet Orders (From admission, onward)     Start     Ordered   02/24/24 1402  Diet renal with fluid restriction Fluid  consistency: Thin  Diet effective now       Question:  Fluid consistency:  Answer:  Thin   02/24/24 1401            DISCHARGE MEDICATION: Allergies as of 02/24/2024       Reactions   Doxazosin Other (See Comments)   Cardura - cough   Doxycycline     Abdominal pain   Drug Class [clindamycin/lincomycin] Hives   Hydralazine  Hcl Other (See Comments)   gout   Metformin  And Related Swelling   Ciprofloxacin Other (See Comments)   Numbness in face   Iodine Other (See Comments)   NOT CT CONTRAST PER PT   Pioglitazone Other (See Comments)   Sacubitril-valsartan Cough   Pt states that it gave her a cough and chest palpitations   Semaglutide Nausea Only   Sulfamethoxazole-trimethoprim Hives   Augmentin [amoxicillin-pot Clavulanate] Diarrhea   Dulaglutide Nausea Only   Hctz [hydrochlorothiazide] Other (See Comments)   Gout   Losartan Diarrhea   Poison Oak Extract Rash   Red Dye #40 (allura Red) Rash        Medication List     PAUSE taking these medications    isosorbide  mononitrate 30 MG 24 hr tablet Wait to take this until your doctor or other care provider tells you to start again. Commonly known as: IMDUR  Take 1 tablet (30 mg total) by mouth daily.   spironolactone  25 MG tablet Wait to take this  until your doctor or other care provider tells you to start again. Commonly known as: ALDACTONE  Take 1/2 tablet (12.5 mg total) by mouth daily.   Xarelto  Starter Pack Wait to take this until your doctor or other care provider tells you to start again. Generic drug: Rivaroxaban  Starter Pack (15 mg and 20 mg) Follow package directions: Take one 15mg  tablet by mouth twice a day. On day 22, switch to one 20mg  tablet once a day. Take with food.       STOP taking these medications    Lantus  SoloStar 100 UNIT/ML Solostar Pen Generic drug: insulin  glargine   Magnesium  Gluconate 550 MG Tabs       TAKE these medications    acetaminophen  500 MG tablet Commonly known as:  TYLENOL  Take 2 tablets (1,000 mg total) by mouth 3 (three) times daily as needed for mild pain (pain score 1-3), moderate pain (pain score 4-6) or headache.   ascorbic acid  500 MG tablet Commonly known as: VITAMIN C  Take 1 tablet (500 mg total) by mouth 2 (two) times daily.   benzonatate  100 MG capsule Commonly known as: TESSALON  Take 1 capsule (100 mg total) by mouth 3 (three) times daily as needed for cough.   carvedilol  6.25 MG tablet Commonly known as: COREG  Take 1 tablet (6.25 mg total) by mouth 2 (two) times daily with a meal. Start taking on: February 25, 2024 What changed:  medication strength how much to take   Chlorhexidine  Gluconate Cloth 2 % Pads Apply 6 each topically daily at 6 (six) AM.   feeding supplement (NEPRO CARB STEADY) Liqd Take 237 mLs by mouth 2 (two) times daily between meals. Start taking on: February 25, 2024   furosemide  10 MG/ML injection Commonly known as: LASIX  Inject 8 mLs (80 mg total) into the vein every 12 (twelve) hours.   gabapentin  100 MG capsule Commonly known as: NEURONTIN  Take 100 mg by mouth 2 (two) times daily.   hydrALAZINE  20 MG/ML injection Commonly known as: APRESOLINE  Inject 0.5 mLs (10 mg total) into the vein every 4 (four) hours as needed (SBP > 165).   HYDROcodone  bit-homatropine 5-1.5 MG/5ML syrup Commonly known as: HYCODAN Take 5 mLs by mouth every 6 (six) hours as needed (when Tessalon  doesn't work).   hydrocortisone  cream 1 % Apply topically 3 (three) times daily as needed for itching.   insulin  aspart 100 UNIT/ML injection Commonly known as: novoLOG  Inject 0-6 Units into the skin 3 (three) times daily with meals. Start taking on: February 25, 2024   ipratropium-albuterol  0.5-2.5 (3) MG/3ML Soln Commonly known as: DUONEB Take 3 mLs by nebulization every 6 (six) hours as needed.   lidocaine  5 % Commonly known as: LIDODERM  Place 1 patch onto the skin daily. Remove & Discard patch within 12 hours or as directed  by MD Start taking on: February 25, 2024   menthol  3 MG lozenge Commonly known as: CEPACOL Take 1 lozenge (3 mg total) by mouth as needed for sore throat.   metoCLOPramide  5 MG/ML injection Commonly known as: REGLAN  Inject 1 mL (5 mg total) into the vein every 8 (eight) hours as needed.   multivitamin Tabs tablet Take 1 tablet by mouth at bedtime.   oxyCODONE  5 MG immediate release tablet Commonly known as: Oxy IR/ROXICODONE  Take 1 tablet (5 mg total) by mouth every 6 (six) hours as needed for severe pain (pain score 7-10).   pantoprazole  40 MG tablet Commonly known as: PROTONIX  Take 1 tablet (40 mg total) by  mouth daily. Start taking on: February 25, 2024   polyethylene glycol 17 g packet Commonly known as: MIRALAX  / GLYCOLAX  Take 17 g by mouth daily as needed for moderate constipation.   prochlorperazine  10 MG/2ML injection Commonly known as: COMPAZINE  Inject 1 mL (5 mg total) into the vein every 4 (four) hours as needed.   promethazine  25 MG tablet Commonly known as: PHENERGAN  Take 1 tablet (25 mg total) by mouth every 6 (six) hours as needed for nausea or vomiting.   sodium chloride  0.65 % Soln nasal spray Commonly known as: OCEAN Place 1 spray into both nostrils as needed for congestion.   Vitamin D (Ergocalciferol) 50 MCG (2000 UT) Caps Take 1 capsule by mouth daily.        Follow-up Information     Paraschos, Alexander, MD. Go in 1 week(s).   Specialty: Cardiology Contact information: [address] [facility] [place] [address] [phone]         Dialysis, [facility] Follow up on 02/23/2024.   Why: Pt dialysis days will be Tuesaday, Thrusday, Saturday with a chair time of 6:45am.  Pt first day, they will need to arrive at 6:30am for new pt paperwork. Contact information: [address] [place] [address] [phone]                 Discharge Exam: Filed Weights   02/23/24 0500 02/24/24 0420  02/24/24 1504  Weight: 71 kg 68.3 kg 71.1 kg   Blood pressure 123/85, pulse 93, temperature 97.6 F (36.4 C), temperature source Oral, resp. rate 20, height 5' 1 (1.549 m), weight 71.1 kg, SpO2 96%.   Gen: NAD, A&Ox3 HEENT: NCAT, EOMI Neck: Supple, no JVD, no LAD CV: RRR, no murmurs Chest: right PermCath  Resp: normal WOB, mild crackles at bilateral bases, on 2L [place] Abd: Soft, NTND, no guarding, BS normoactive Ext: No LE edema, pulses 2+ b/l, right femoral HD catheter in place Skin: Warm, dry, no rashes/lesions Neuro: CN II-XII grossly intact, strength 5/5 b/l, sensation intact Psych: Calm, cooperative, appropriate affect   Condition at discharge: poor  The results of significant diagnostics from this hospitalization (including imaging, microbiology, ancillary and laboratory) are listed below for reference.   Imaging Studies: PERIPHERAL VASCULAR CATHETERIZATION Result Date: 02/24/2024 See surgical note for result.  DG Chest Port 1 View Result Date: 02/23/2024 CLINICAL DATA:  142230 Pleural effusion 142230 [id number] Status post thoracentesis [id number] EXAM: PORTABLE CHEST 1 VIEW COMPARISON:  IR THORACENTESIS, EARLIER SAME DAY. CHEST XR, 02/22/2024. CT CHEST, 02/10/2024. FINDINGS: Support lines; LEFT chest subclavian-approach pacer with intact dual leads. Mild enlargement of the cardiac silhouette. Aortic arch atherosclerosis. Improved aeration of the RIGHT lung post thoracentesis without residual pleural effusion. No pneumothorax. Similar well-aerated LEFT lung. No focal consolidation. No acute osseous abnormality. IMPRESSION: 1. Improved aeration of the RIGHT lung post thoracentesis without residual pleural effusion. No pneumothorax. 2. Cardiomegaly and Aortic Atherosclerosis (ICD10-I70.0). Electronically Signed   By: Thom Hall M.D.   On: 02/23/2024 13:35   US  THORACENTESIS ASP PLEURAL SPACE W/IMG GUIDE Result Date: 02/23/2024 INDICATION: Symptomatic RIGHT sided pleural effusion. Prior  RIGHT thoracentesis 02/17/2024, with 1.2 L aspirated. EXAM: US  THORACENTESIS ASP PLEURAL SPACE W/IMG GUIDE COMPARISON:  Chest XR, 02/22/2024. MEDICATIONS: None. COMPLICATIONS: None immediate. TECHNIQUE: Informed written consent was obtained from the patient after a discussion of the risks, benefits and alternatives to treatment. A timeout was performed prior to the initiation of the procedure. Initial ultrasound scanning demonstrates a moderate-to-large volume RIGHT  pleural effusion. The lower chest was prepped and draped in the usual sterile fashion. 1% lidocaine  was used for local anesthesia. An ultrasound image was saved for documentation purposes. A 6 Fr Safe-T-Centesis catheter was introduced. The thoracentesis was performed. The catheter was removed and a dressing was applied. The patient tolerated the procedure well without immediate post procedural complication. The patient was escorted to have an upright chest radiograph. FINDINGS: A total of approximately 1550 mL of serosanguineous fluid was removed. Requested samples were sent to the laboratory. IMPRESSION: Successful ultrasound-guided diagnostic and therapeutic RIGHT thoracentesis yielding 1550 mL of pleural fluid. Thom Hall, MD [facility] Electronically Signed   By: Thom Hall M.D.   On: 02/23/2024 11:54   DG Chest Port 1 View Result Date: 02/22/2024 EXAM: 1 VIEW(S) XRAY OF THE CHEST 02/22/2024 05:12:34 PM COMPARISON: 02/17/2024 CLINICAL HISTORY: Dyspnea FINDINGS: LINES, TUBES AND DEVICES: Multiple wires and leads project over the chest on the frontal radiograph. Dual pacer is present and changed. LUNGS AND PLEURA: Moderate right pleural effusion increased. Right mid and lower lung airspace disease, most likely atelectasis. No pneumothorax. HEART AND MEDIASTINUM: Moderate cardiomegaly with mild interstitial edema. BONES AND SOFT TISSUES: No acute osseous abnormality. IMPRESSION: 1.  Worsened right mid and lower lung airspace disease, most likely atelectasis. 2. Reaccumulation of moderate right pleural effusion. 3. Cardiomegaly with mild interstitial edema. Electronically signed by: Rockey Kilts MD 02/22/2024 05:59 PM EST RP Workstation: [id number]   DG Chest Port 1 View Result Date: 02/17/2024 CLINICAL DATA:  Status post thoracentesis of right pleural effusion. EXAM: PORTABLE CHEST 1 VIEW COMPARISON:  Chest radiograph dated 11/26 7. FINDINGS: Interval decrease in the size of the right pleural effusion post thoracentesis. No pneumothorax. Stable cardiac silhouette. Left pectoral pacemaker device. No acute osseous pathology. IMPRESSION: Interval decrease in the size of the right pleural effusion post thoracentesis. No pneumothorax. Electronically Signed   By: Vanetta Chou M.D.   On: 02/17/2024 17:24   US  THORACENTESIS ASP PLEURAL SPACE W/IMG GUIDE Result Date: 02/17/2024 INDICATION: 73 year old female with history of fluid overload, recurrent pleural effusion. Request for right thoracentesis. EXAM: ULTRASOUND GUIDED RIGHT THORACENTESIS MEDICATIONS: 10 mL 1% lidocaine  COMPLICATIONS: None immediate. PROCEDURE: An ultrasound guided thoracentesis was thoroughly discussed with the patient and questions answered. The benefits, risks, alternatives and complications were also discussed. The patient understands and wishes to proceed with the procedure. Written consent was obtained. Ultrasound was performed to localize and mark an adequate pocket of fluid in the right chest. The area was then prepped and draped in the normal sterile fashion. 1% Lidocaine  was used for local anesthesia. Under ultrasound guidance a 6 Fr Safe-T-Centesis catheter was introduced. Thoracentesis was performed. The catheter was removed and a dressing applied. FINDINGS: A total of approximately 1.2 liters of clear, yellow fluid was removed. Samples were sent to the laboratory as requested by the clinical team. IMPRESSION:  Successful ultrasound guided right thoracentesis yielding 1.2 liters of pleural fluid. Performed by: Kacie Matthews PA-C Electronically Signed   By: Wilkie Lent M.D.   On: 02/17/2024 15:59   DG Chest Port 1 View Result Date: 02/17/2024 EXAM: 1 VIEW(S) XRAY OF THE CHEST 02/17/2024 12:17:00 PM COMPARISON: 02/14/2024 CLINICAL HISTORY: Dyspnea FINDINGS: LINES, TUBES AND DEVICES: Left chest cardiac pacing device noted. LUNGS AND PLEURA: Moderate pleural effusion has increased since the recent radiograph. There are increased right mid lung and basilar opacities likely reflecting atelectasis versus infection. No pneumothorax. HEART AND MEDIASTINUM: Cardiomegaly, unchanged.  Atherosclerotic calcifications. BONES AND SOFT TISSUES: No acute osseous abnormality. IMPRESSION: 1. Moderate right pleural effusion, increased since the recent radiograph. 2. Increased right mid lung and basilar opacities, likely reflecting atelectasis versus infection. Electronically signed by: Donnice Mania MD 02/17/2024 01:10 PM EST RP Workstation: [id number]   ABORTED INVASIVE LAB PROCEDURE Result Date: 02/16/2024 See surgical note for result.  DG Chest Port 1 View Result Date: 02/14/2024 CLINICAL DATA:  [id number] Dyspnea [id number] EXAM: PORTABLE CHEST 1 VIEW COMPARISON:  February 13, 2024, January 25, 2024 FINDINGS: The cardiomediastinal silhouette is unchanged and enlarged in contour.Increased conspicuity of density along the RIGHT diaphragmatic border favored to reflect an increasing pleural effusion with associated atelectasis. This appears similar compared to November 3rd radiograph. No pneumothorax. Mild interstitial prominence. LEFT chest cardiac pacing device. Atherosclerotic calcifications. IMPRESSION: 1. Increased conspicuity of density along the RIGHT diaphragmatic border favored to reflect an increasing pleural effusion with atelectasis. This appears similar compared to November 3rd radiograph. 2. Mild interstitial prominence  may reflect a degree of underlying pulmonary edema. Electronically Signed   By: Corean Salter M.D.   On: 02/14/2024 11:36   US  RENAL Result Date: 02/13/2024 CLINICAL DATA:  [id number] Decreased urine output [id number] EXAM: RENAL / URINARY TRACT ULTRASOUND COMPLETE COMPARISON:  February 10, 2024, January 05, 2023 FINDINGS: Right Kidney: Renal measurements: 12.4 x 4.7 by 4.3 cm = volume: 132 mL. Echogenicity within normal limits. No mass or hydronephrosis visualized. Left Kidney: Renal measurements: 10.7 x 4.2 x 5.4 cm = volume: 125 mL. Echogenicity within normal limits. No mass or hydronephrosis visualized. Echogenic focus in the region of the renal pelvis measuring 12 mm may reflect a nephrolithiasis or vascular calcification. Bladder: Not visualized secondary to shadowing bowel gas. Other: None. IMPRESSION: 1. No hydronephrosis. 2. Echogenic focus in the region of the left renal pelvis may reflect a nephrolithiasis or vascular calcification. Electronically Signed   By: Corean Salter M.D.   On: 02/13/2024 13:18   DG Chest 1 View Result Date: 02/13/2024 EXAM: 1 VIEW(S) XRAY OF THE CHEST 02/13/2024 06:27:00 AM COMPARISON: 02/12/2024 CLINICAL HISTORY: 73 year old female with shortness of breath. FINDINGS: LINES, TUBES AND DEVICES: Left chest cardiac pacing device noted. LUNGS AND PLEURA: Increased small right pleural effusion. Increased trace pleural fluid in the right minor fissure. Increased right perihilar interstitial opacities. Vague increased perihilar opacity. No pneumothorax. HEART AND MEDIASTINUM: Cardiomegaly, unchanged. Aortic arch atherosclerotic calcifications. BONES AND SOFT TISSUES: No acute osseous abnormality. IMPRESSION: 1. Mild progression of pulmonary edema. 2. Small right pleural effusion mildly increased. Electronically signed by: Helayne Hurst MD 02/13/2024 06:37 AM EST RP Workstation: [id number]   DG Chest Port 1 View Result Date: 02/12/2024 EXAM: 1 VIEW(S) XRAY OF THE CHEST  02/12/2024 06:20:59 AM COMPARISON: 02/11/2024 CLINICAL HISTORY: Acute respiratory failure with hypoxia (HCC) [id number] FINDINGS: LINES, TUBES AND DEVICES: Left cardiac device stable in place. LUNGS AND PLEURA: No focal pulmonary opacity. Mild pulmonary edema. Mild bilateral interstitial prominence. Trace right pleural effusion. No pneumothorax. HEART AND MEDIASTINUM: Cardiomegaly. Aortic atherosclerosis. BONES AND SOFT TISSUES: No acute osseous abnormality. IMPRESSION: 1. Mild pulmonary edema with mild bilateral interstitial prominence. 2. Trace right pleural effusion. 3. Cardiomegaly. Electronically signed by: Waddell Calk MD 02/12/2024 08:44 AM EST RP Workstation: [id number]   DG Chest Port 1 View Result Date: 02/11/2024 CLINICAL DATA:  Status post right-sided thoracentesis. EXAM: PORTABLE CHEST 1 VIEW COMPARISON:  02/11/2024. FINDINGS: Low lung volumes. Stable cardiomediastinal contours. Decreased size of a small right pleural effusion with suspected trace residual right  pleural effusion. No pneumothorax. Similar mild interstitial edema. Stable left subclavian dual lead pacer. No acute osseous abnormality. IMPRESSION: 1. Decreased size of a small right pleural effusion with suspected trace residual right pleural effusion. No pneumothorax. 2. Similar mild interstitial edema. Electronically Signed   By: Harrietta Sherry M.D.   On: 02/11/2024 13:21   DG Chest Port 1 View Result Date: 02/11/2024 EXAM: 1 VIEW(S) XRAY OF THE CHEST 02/11/2024 08:25:21 AM COMPARISON: 02/10/2024 CLINICAL HISTORY: Pleural effusion. FINDINGS: LINES, TUBES AND DEVICES: External pacing pads noted. Left subclavian dual lead pacemaker in place. LUNGS AND PLEURA: Low lung volumes. Improved right lung airspace opacities. Mild pulmonary edema. Small right pleural effusion. No pneumothorax. HEART AND MEDIASTINUM: Mild cardiomegaly. Aortic arch atherosclerosis. BONES AND SOFT TISSUES: No acute osseous abnormality. IMPRESSION: 1. Small right  pleural effusion. 2. Mild pulmonary edema. 3. Improved right lung airspace opacities. Electronically signed by: Waddell Calk MD 02/11/2024 11:49 AM EST RP Workstation: [id number]   ECHOCARDIOGRAM COMPLETE Result Date: 02/10/2024    ECHOCARDIOGRAM REPORT   Patient Name:   BLESSED GIRDNER Mcclenton Date of Exam: 02/10/2024 Medical Rec #:  [id number]    Height:       61.0 in Accession #:    [id number]   Weight:       155.4 lb Date of Birth:  03-29-1950    BSA:          1.697 m Patient Age:    73 years     BP:           137/97 mmHg Patient Gender: F            HR:           79 bpm. Exam Location:  [facility] Procedure: 2D Echo, Cardiac Doppler and Color Doppler (Both Spectral and Color            Flow Doppler were utilized during procedure). Indications:     Cardiac arrest I46.9  History:         Patient has prior history of Echocardiogram examinations, most                  recent 08/11/2020. Cardiomyopathy and CHF, Pacemaker,                  Arrythmias:Atrial Fibrillation; Risk Factors:Diabetes.  Sonographer:     Christopher Furnace Referring Phys:  [id number] BRITTON L [place] Diagnosing Phys: [name] MD  Sonographer Comments: Echo performed with patient supine and on artificial respirator. IMPRESSIONS  1. TDS.  2. Left ventricular ejection fraction, by estimation, is 30 to 35%. The left ventricle has moderately decreased function. The left ventricle demonstrates global hypokinesis. Left ventricular diastolic function could not be evaluated.  3. Right ventricular systolic function is moderately reduced. The right ventricular size is mildly enlarged. Mildly increased right ventricular wall thickness.  4. Left atrial size was mildly dilated.  5. Right atrial size was mildly dilated.  6. The mitral valve is normal in structure. No evidence of mitral valve regurgitation.  7. The aortic valve was not well visualized. Aortic valve regurgitation is not visualized. Conclusion(s)/Recommendation(s): Poor windows for evaluation of left  ventricular function by transthoracic echocardiography. Would recommend an alternative means of evaluation. FINDINGS  Left Ventricle: Left ventricular ejection fraction, by estimation, is 30 to 35%. The left ventricle has moderately decreased function. The left ventricle demonstrates global hypokinesis. Strain was performed and the global longitudinal strain is indeterminate. The left ventricular internal cavity size was normal  in size. There is no left ventricular hypertrophy. Abnormal (paradoxical) septal motion, consistent with left bundle branch block. Left ventricular diastolic function could not be evaluated. Right Ventricle: The right ventricular size is mildly enlarged. Mildly increased right ventricular wall thickness. Right ventricular systolic function is moderately reduced. Left Atrium: Left atrial size was mildly dilated. Right Atrium: Right atrial size was mildly dilated. Pericardium: There is no evidence of pericardial effusion. Mitral Valve: The mitral valve is normal in structure. No evidence of mitral valve regurgitation. Tricuspid Valve: The tricuspid valve is normal in structure. Tricuspid valve regurgitation is not demonstrated. Aortic Valve: The aortic valve was not well visualized. Aortic valve regurgitation is not visualized. Aortic valve mean gradient measures 2.0 mmHg. Aortic valve peak gradient measures 2.9 mmHg. Aortic valve area, by VTI measures 1.99 cm. Pulmonic Valve: The pulmonic valve was normal in structure. Pulmonic valve regurgitation is not visualized. Aorta: Aortic root could not be assessed. IAS/Shunts: No atrial level shunt detected by color flow Doppler. Additional Comments: TDS. 3D was performed not requiring image post processing on an independent workstation and was indeterminate.  LEFT VENTRICLE PLAX 2D LVIDd:         4.60 cm LVIDs:         3.80 cm LV PW:         1.00 cm LV IVS:        0.90 cm LVOT diam:     2.00 cm LV SV:         23 LV SV Index:   13 LVOT Area:     3.14  cm  LV Volumes (MOD) LV vol d, MOD A2C: 80.9 ml LV vol d, MOD A4C: 86.1 ml LV vol s, MOD A2C: 64.6 ml LV vol s, MOD A4C: 84.5 ml LV SV MOD A2C:     16.3 ml LV SV MOD A4C:     86.1 ml LV SV MOD BP:      11.7 ml RIGHT VENTRICLE RV Basal diam:  3.90 cm RV Mid diam:    3.40 cm RV S prime:     8.81 cm/s LEFT ATRIUM           Index        RIGHT ATRIUM           Index LA diam:      4.30 cm 2.53 cm/m   RA Area:     18.80 cm LA Vol (A2C): 59.9 ml 35.30 ml/m  RA Volume:   57.70 ml  34.00 ml/m LA Vol (A4C): 31.8 ml 18.74 ml/m  AORTIC VALVE AV Area (Vmax):    1.61 cm AV Area (Vmean):   1.60 cm AV Area (VTI):     1.99 cm AV Vmax:           85.65 cm/s AV Vmean:          61.550 cm/s AV VTI:            0.114 m AV Peak Grad:      2.9 mmHg AV Mean Grad:      2.0 mmHg LVOT Vmax:         43.90 cm/s LVOT Vmean:        31.300 cm/s LVOT VTI:          0.072 m LVOT/AV VTI ratio: 0.63  AORTA Ao Root diam: 2.60 cm MITRAL VALVE               TRICUSPID VALVE MV Area (PHT): 5.42 cm  TR Peak grad:   36.5 mmHg MV Decel Time: 140 msec    TR Vmax:        302.00 cm/s MV E velocity: 89.60 cm/s                            SHUNTS                            Systemic VTI:  0.07 m                            Systemic Diam: 2.00 cm [name] MD Electronically signed by [name] MD Signature Date/Time: 02/10/2024/6:25:52 PM    Final    DG Abd 1 View Result Date: 02/10/2024 EXAM: 1 VIEW XRAY OF THE ABDOMEN 02/10/2024 04:44:00 AM COMPARISON: 02/10/2024 CLINICAL HISTORY: Encounter for imaging study to confirm orogastric (OG) tube placement FINDINGS: LINES, TUBES AND DEVICES: Gastric tube in place with side hole at the level of the gastric body. Cardiac pacemaker noted. BOWEL: Nonobstructive bowel gas pattern. SOFT TISSUES: Surgical clips in the right upper quadrant. No opaque urinary calculi. BONES: No acute osseous abnormality. LUNG BASES: Interstitial and patchy airspace opacities in the partially visualized lung bases, probably  atelectasis. IMPRESSION: 1. Satisfactory enteric tube placement into the stomach. Electronically signed by: Helayne Hurst MD 02/10/2024 05:25 AM EST RP Workstation: [id number]   DG Abd 1 View Result Date: 02/10/2024 EXAM: 1 VIEW XRAY OF THE ABDOMEN 02/10/2024 04:05:00 AM COMPARISON: None available. CLINICAL HISTORY: Encounter for imaging study to confirm orogastric (OG) tube placement. FINDINGS: LINES, TUBES AND DEVICES: Enteric tube in place with tip and side port overlying the expected region of the gastric lumen. Cardiac pacemaker noted. BOWEL: Nonobstructive bowel gas pattern. SOFT TISSUES: No opaque urinary calculi. BONES: No acute osseous abnormality. LUNG BASES: Patchy airspace opacities in the partially visualized lung bases. IMPRESSION: 1. Enteric tube in appropriate position with tip and side port overlying the expected region of the gastric lumen. 2. Patchy airspace opacities in the partially visualized lung bases. Electronically signed by: Dorethia Molt MD 02/10/2024 04:25 AM EST RP Workstation: [id number]   CT CHEST WO CONTRAST Result Date: 02/10/2024 EXAM: CT CHEST WITHOUT CONTRAST 02/10/2024 01:28:16 AM TECHNIQUE: CT of the chest was performed without the administration of intravenous contrast. Multiplanar reformatted images are provided for review. Automated exposure control, iterative reconstruction, and/or weight based adjustment of the mA/kV was utilized to reduce the radiation dose to as low as reasonably achievable. COMPARISON: Vertebrae 18 to 2025. CLINICAL HISTORY: CXR post cardiac arrest: predominantly within the right upper lobe, possibly representing acute pneumonic consolidation, pulmonary hemorrhage, or asymmetric alveolar pulmonary edema as could be seen with acute mitral valve regurgitation. FINDINGS: MEDIASTINUM: Heart: Mild coronary artery calcification. Mild cardiomegaly. A left subclavian dual-lead pacemaker is identified with leads within the left ventricular venous outflow  and right ventricle toward the apex. No pericardial effusion. Vessels: The central pulmonary arteries are enlarged in keeping with changes of pulmonary arterial hypertension. Mild atherosclerotic calcification within the thoracic aorta. No aortic aneurysm. Airways: The central airways are clear. Interval endotracheal tube placement attempts 2.6 cm above the carina. Other: Interval nasogastric tube placement extending into the stomach beyond the margin of the exam. Visualized thyroid is unremarkable. LYMPH NODES: Extensive mediastinal adenopathy is again identified and appears stable, nonspecific. This may be reactive, inflammatory as can  be seen with conditions such as sarcoidosis, or lymphoproliferative in etiology. Comparison with interval examinations, if available, would be helpful in determining stability. If none are available, follow-up CT or PET CT examination would be helpful in 3 months to demonstrate stability or resolution. LUNGS AND PLEURA: Extensive asymmetric pulmonary ground-glass infiltrate, multiple consolidation, and smooth interlobular septal thickening, asymmetrically more severe within the right upper lobe. Given its relatively rapid development since prior examination, the findings are most suggestive of asymmetric pulmonary edema, though pulmonary hemorrhage could appear similarly. Extensive pneumonic infiltrate or changes of ARDS are considered less likely given the relatively rapid development. Moderate to large right pleural effusion again noted with compressive atelectasis of the right lower lobe. No pneumothorax. SOFT TISSUES/BONES: Acute fracture of the left seventh rib anterolaterally with minimal displacement. No other acute bone abnormality identified. Osseous structures are otherwise age appropriate. UPPER ABDOMEN: Limited images of the upper abdomen demonstrates no acute abnormality. IMPRESSION: 1. Extensive asymmetric pulmonary ground-glass infiltrate, multifocal consolidation,  and smooth interlobular septal thickening, greatest in the right upper lobe, most suggestive of asymmetric pulmonary edema given rapid interval development; acute pulmonary hemorrhage is a differential consideration. Extensive pneumonic infiltrate or ARDS considered less likely. 2. Moderate to large right pleural effusion with compressive atelectasis of the right lower lobe. 3. Acute minimally displaced fracture of the left anterolateral seventh rib. 4. Thoracic adenopathy as outlined above. This is nonspecific, but may be reactive, inflammatory as can be seen with conditions such as sarcoidosis, or lymphoproliferative in etiology. Comparison with interval examinations, if available, be helpful in determining stability. If none are available, follow-up CT or PET CT examination would be helpful in 3 months to demonstrate stability or resolution. 5. Findings consistent with pulmonary arterial hypertension (enlarged central pulmonary arteries). 6. Mild coronary artery calcification and mild cardiomegaly. Electronically signed by: Dorethia Molt MD 02/10/2024 01:46 AM EST RP Workstation: [id number]   CT HEAD WO CONTRAST ( ) Result Date: 02/10/2024 EXAM: CT HEAD WITHOUT CONTRAST 02/10/2024 01:28:16 AM TECHNIQUE: CT of the head was performed without the administration of intravenous contrast. Automated exposure control, iterative reconstruction, and/or weight based adjustment of the mA/kV was utilized to reduce the radiation dose to as low as reasonably achievable. COMPARISON: None available. CLINICAL HISTORY: Mental status change, unknown cause. FINDINGS: BRAIN AND VENTRICLES: Cerebral ventricle sizes are concordant with the degree of cerebral volume loss.No acute hemorrhage. No evidence of acute infarct. No hydrocephalus. No extra-axial collection. No mass effect or midline shift. Residual contrast material within intracranial vasculature from recent contrast administration. ORBITS: Bilateral lens replacement.  SINUSES: No acute abnormality. SOFT TISSUES AND SKULL: Partially imaged endotracheal tube in place. No acute soft tissue abnormality. No skull fracture. IMPRESSION: 1. No acute intracranial abnormality. Electronically signed by: Morgane Naveau MD 02/10/2024 01:40 AM EST RP Workstation: [id number]   DG Chest Portable 1 View Result Date: 02/10/2024 EXAM: 1 VIEW(S) XRAY OF THE CHEST 02/10/2024 12:12:00 AM COMPARISON: 02/09/2024 CLINICAL HISTORY: post intubation FINDINGS: LINES, TUBES AND DEVICES: Endotracheal tube in place with tip 2.2 cm above the carina. Enteric tube in place, courses below the diaphragm. LUNGS AND PLEURA: Interval development of extensive airspace infiltrate within the right lung, predominantly within the right upper lobe. This may represent acute pneumonic consolidation, pulmonary hemorrhage or asymmetric alveolar pulmonary edema as could be seen with acute mitral valve regurgitation. Known right pleural effusion seen on CT examination of 02/09/2024 is not well appreciated on this supine radiograph. No definite pneumothorax. HEART AND MEDIASTINUM: Similar mild cardiomegaly. Left chest  cardiac pacemaker noted. Aortic arch atherosclerosis. BONES AND SOFT TISSUES: No acute osseous abnormality. IMPRESSION: 1. Interval development of extensive airspace infiltrate within the right lung, predominantly within the right upper lobe, possibly representing acute pneumonic consolidation, pulmonary hemorrhage, or asymmetric alveolar pulmonary edema as could be seen with acute mitral valve regurgitation. 2. Known right pleural effusion seen on CT examination of 02/09/2024 is not well appreciated on this supine radiograph. No definite pneumothorax. Electronically signed by: Dorethia Molt MD 02/10/2024 12:22 AM EST RP Workstation: [id number]   CT Angio Chest PE W and/or Wo Contrast Result Date: 02/09/2024 EXAM: CTA CHEST 02/09/2024 06:29:20 PM TECHNIQUE: CTA of the chest was performed without and with the  administration of 75 mL of iohexol  (OMNIPAQUE ) 350 MG/ML injection. Multiplanar reformatted images are provided for review. MIP images are provided for review. Automated exposure control, iterative reconstruction, and/or weight based adjustment of the mA/kV was utilized to reduce the radiation dose to as low as reasonably achievable. COMPARISON: None available. CLINICAL HISTORY: Pulmonary embolism (PE) suspected, low to intermediate prob, positive D-dimer. FINDINGS: PULMONARY ARTERIES: Pulmonary arteries are adequately opacified for evaluation. No acute pulmonary embolus. Main pulmonary artery is normal in caliber. MEDIASTINUM: The heart and pericardium demonstrate no acute abnormality. There is no acute abnormality of the thoracic aorta. LYMPH NODES: Right hilar lymphadenopathy with, as an example, a 1.2 cm lymph node (5:54). Left hilar lymphadenopathy with, as an example, a 1.2 cm lymph node (5 x 15 x 4 mm). Mediastinal lymphadenopathy with a right paratracheal 1.1 cm lymph node (5.32). No axillary lymphadenopathy. LUNGS AND PLEURA: At least a small volume right pleural effusion. No left pleural effusion. No pneumothorax. Basal atelectasis of the right lower lobe. Diffuse bronchial wall thickening with mucous plugging within the right lower lobe. Mucous plugging within the left lower lobe to a lesser extent. Question of developing small consolidation within the right lower lobe (6:88) with associated bronchial plugging. The trachea and mainstem bronchi are patent. Expiratory phase respiration. UPPER ABDOMEN: Limited images of the upper abdomen are unremarkable. SOFT TISSUES AND BONES: Left chest wall 2 lead cardiac device. No acute bone or soft tissue abnormality. IMPRESSION: 1. No pulmonary embolism. 2. Diffuse bronchial wall thickening with mucous plugging, greater in the right lower lobe, with probable developing small right lower lobe consolidation. 3. Small right pleural effusion. 4. Right hilar, left hilar,  and mediastinal lymphadenopathy. Likely reactive in etiology. Recommend attention on follow-up. Electronically signed by: Morgane Naveau MD 02/09/2024 07:12 PM EST RP Workstation: [id number]   DG Chest 2 View Result Date: 02/09/2024 CLINICAL DATA:  Shortness of breath and pleural effusion. EXAM: CHEST - 2 VIEW COMPARISON:  Chest x-ray 01/29/2024.  Chest CT 01/24/2024. FINDINGS: There is a small right pleural effusion. Left lung is clear. No pneumothorax or focal lung infiltrate. The cardiac silhouette is enlarged. Left-sided pacemaker is again seen. No acute fractures are identified. IMPRESSION: 1. Small right pleural effusion. 2. Cardiomegaly. Electronically Signed   By: Greig Pique M.D.   On: 02/09/2024 15:56   US  THORACENTESIS ASP PLEURAL SPACE W/IMG GUIDE Result Date: 01/29/2024 INDICATION: 73 year old female presents with shortness of breath, cough, and recurrent right pleural effusion. Received request for diagnostic and therapeutic thoracentesis. EXAM: ULTRASOUND GUIDED RIGHT THORACENTESIS MEDICATIONS: 10 mL 1% lidocaine  COMPLICATIONS: None immediate. PROCEDURE: An ultrasound guided thoracentesis was thoroughly discussed with the patient and questions answered. The benefits, risks, alternatives and complications were also discussed. The patient understands and wishes to proceed with the procedure. Written consent  was obtained. Ultrasound was performed to localize and mark an adequate pocket of fluid in the right chest. The area was then prepped and draped in the normal sterile fashion. 1% lidocaine  was used for local anesthesia. Under ultrasound guidance a 6 Fr Safe-T-Centesis catheter was introduced. Thoracentesis was performed. The catheter was removed and a dressing applied. FINDINGS: A total of approximately 700 mL of amber fluid was removed. Samples were sent to the laboratory as requested by the clinical team. IMPRESSION: Successful ultrasound guided right thoracentesis yielding 700 mL of  pleural fluid. Performed by: Kristi Davenport, NP Electronically Signed   By: Cordella Banner   On: 01/29/2024 15:39   DG Chest Port 1 View Result Date: 01/29/2024 EXAM: 1 VIEW(S) XRAY OF THE CHEST 01/29/2024 11:40:00 AM COMPARISON: 01/29/2024 CLINICAL HISTORY: Recurrent right pleural effusion FINDINGS: LINES, TUBES AND DEVICES: Left chest pacemaker with leads terminating in the right atrium and coronary sinus. LUNGS AND PLEURA: No focal pulmonary opacity. No pulmonary edema. Near complete resolution of the right pleural effusion with minimal blunting of the right costophrenic angle. Improved aeration of right lung base. No pneumothorax. HEART AND MEDIASTINUM: Cardiomegaly. BONES AND SOFT TISSUES: No acute osseous abnormality. Cholecystectomy clips. IMPRESSION: 1. Near complete resolution of the right pleural effusion with minimal residual blunting of the right costophrenic angle and improved aeration of the right lung base. Electronically signed by: Rogelia Myers MD 01/29/2024 12:53 PM EST RP Workstation: [id number]   DG Chest 2 View Result Date: 01/29/2024 EXAM: 2 VIEW(S) XRAY OF THE CHEST 01/29/2024 06:46:00 AM COMPARISON: 01/26/2024 CLINICAL HISTORY: Pleural effusion on right. FINDINGS: LINES, TUBES AND DEVICES: Stable 2 lead left chest pacemaker. LUNGS AND PLEURA: Increased right base airspace disease. Small right pleural effusion, mildly increased. No pulmonary edema. No pneumothorax. HEART AND MEDIASTINUM: No acute abnormality of the cardiac and mediastinal silhouettes. BONES AND SOFT TISSUES: Upper abdominal surgical clips noted. No acute osseous abnormality. IMPRESSION: 1. Redeveloping small right pleural effusion with increased right base airspace disease, most likely atelectasis. 2. No pneumothorax. Electronically signed by: Rockey Kilts MD 01/29/2024 11:11 AM EST RP Workstation: [id number]   US  THORACENTESIS ASP PLEURAL SPACE W/IMG GUIDE Result Date: 01/26/2024 INDICATION: 73 year old female  presents with shortness of breath. Received request for diagnostic and therapeutic thoracentesis. EXAM: ULTRASOUND GUIDED RIGHT THORACENTESIS MEDICATIONS: 8 mL 1% lidocaine  COMPLICATIONS: None immediate. PROCEDURE: An ultrasound guided thoracentesis was thoroughly discussed with the patient and questions answered. The benefits, risks, alternatives and complications were also discussed. The patient understands and wishes to proceed with the procedure. Written consent was obtained. Ultrasound was performed to localize and mark an adequate pocket of fluid in the right chest. The area was then prepped and draped in the normal sterile fashion. 1% lidocaine  was used for local anesthesia. Under ultrasound guidance a 6 Fr Safe-T-Centesis catheter was introduced. Thoracentesis was performed. The catheter was removed and a dressing applied. FINDINGS: A total of approximately 900 mL of clear yellow fluid was removed. Samples were sent to the laboratory as requested by the clinical team. IMPRESSION: Successful ultrasound guided right thoracentesis yielding 900 mL of pleural fluid. Performed by: Rayfield Buff, NP Electronically Signed   By: Cordella Banner   On: 01/26/2024 14:49   DG Chest Port 1 View Result Date: 01/26/2024 EXAM: 1 VIEW(S) XRAY OF THE CHEST 01/26/2024 01:02:00 PM COMPARISON: 01/25/2024 CLINICAL HISTORY: SOB (shortness of breath); Status post thoracentesis FINDINGS: LUNGS AND PLEURA: Evacuation of right pleural effusion with a small amount of residual fluid. Minimal  overlying atelectasis. The left lung remains clear No post-procedural pneumothorax. No pulmonary edema. No pneumothorax. HEART AND MEDIASTINUM: Cardiomegaly with left subclavian approach cardiac rhythm maintenance device. Aortic arch atherosclerosis. BONES AND SOFT TISSUES: No acute osseous abnormality. IMPRESSION: 1. Evacuation of right pleural effusion with a small residual effusion and minimal overlying atelectasis. No pneumothorax. 2. No  post-procedure or pneumothorax. Electronically signed by: Maude Stammer MD 01/26/2024 01:38 PM EST RP Workstation: [id number]    Microbiology: Results for orders placed or performed during the hospital encounter of 02/09/24  Blood culture (routine x 2)     Status: None   Collection Time: 02/09/24  9:27 PM   Specimen: BLOOD  Result Value Ref Range Status   Specimen Description BLOOD BLOOD RIGHT ARM  Final   Special Requests   Final    BOTTLES DRAWN AEROBIC AND ANAEROBIC Blood Culture results may not be optimal due to an inadequate volume of blood received in culture bottles   Culture   Final    NO GROWTH 5 DAYS Performed at [facility], [address], [place], [place] [address]    Report Status 02/14/2024 FINAL  Final  Blood culture (routine x 2)     Status: None   Collection Time: 02/09/24  9:27 PM   Specimen: BLOOD  Result Value Ref Range Status   Specimen Description BLOOD BLOOD LEFT ARM  Final   Special Requests   Final    BOTTLES DRAWN AEROBIC AND ANAEROBIC Blood Culture results may not be optimal due to an inadequate volume of blood received in culture bottles   Culture   Final    NO GROWTH 5 DAYS Performed at [facility], [address] [place], [place] [address]    Report Status 02/14/2024 FINAL  Final  MRSA Next Gen by PCR, Nasal     Status: None   Collection Time: 02/10/24  1:49 AM   Specimen: Nasal Mucosa; Nasal Swab  Result Value Ref Range Status   MRSA by PCR Next Gen NOT DETECTED NOT DETECTED Final    Comment: (NOTE) The GeneXpert MRSA Assay (FDA approved for NASAL specimens only), is one component of a comprehensive MRSA colonization surveillance program. It is not intended to diagnose MRSA infection nor to guide or monitor treatment for MRSA infections. Test performance is not FDA approved in patients less than 80 years old. Performed at [facility], [address] [place], [place] [address]   Respiratory (~20 pathogens)  panel by PCR     Status: Abnormal   Collection Time: 02/10/24  1:49 AM   Specimen: Nasopharyngeal Swab; Respiratory  Result Value Ref Range Status   Adenovirus NOT DETECTED NOT DETECTED Final   Coronavirus 229E NOT DETECTED NOT DETECTED Final    Comment: (NOTE) The Coronavirus on the Respiratory Panel, DOES NOT test for the novel  Coronavirus (2019 nCoV)    Coronavirus HKU1 NOT DETECTED NOT DETECTED Final   Coronavirus NL63 NOT DETECTED NOT DETECTED Final   Coronavirus OC43 NOT DETECTED NOT DETECTED Final   Metapneumovirus NOT DETECTED NOT DETECTED Final   Rhinovirus / Enterovirus NOT DETECTED NOT DETECTED Final   Influenza A NOT DETECTED NOT DETECTED Final   Influenza B NOT DETECTED NOT DETECTED Final   Parainfluenza Virus 1 NOT DETECTED NOT DETECTED Final   Parainfluenza Virus 2 NOT DETECTED NOT DETECTED Final   Parainfluenza Virus 3 DETECTED (A) NOT DETECTED Final   Parainfluenza Virus 4 NOT DETECTED NOT DETECTED Final   Respiratory Syncytial Virus  NOT DETECTED NOT DETECTED Final   Bordetella pertussis NOT DETECTED NOT DETECTED Final   Bordetella Parapertussis NOT DETECTED NOT DETECTED Final   Chlamydophila pneumoniae NOT DETECTED NOT DETECTED Final   Mycoplasma pneumoniae NOT DETECTED NOT DETECTED Final    Comment: Performed at [facility], [address] [place], [place] [id number]  Resp panel by RT-PCR (RSV, Flu A&B, Covid) Anterior Nasal Swab     Status: None   Collection Time: 02/10/24  3:17 AM   Specimen: Anterior Nasal Swab  Result Value Ref Range Status   SARS Coronavirus 2 by RT PCR NEGATIVE NEGATIVE Final    Comment: (NOTE) SARS-CoV-2 target nucleic acids are NOT DETECTED.  The SARS-CoV-2 RNA is generally detectable in upper respiratory specimens during the acute phase of infection. The lowest concentration of SARS-CoV-2 viral copies this assay can detect is 138 copies/mL. A negative result does not preclude SARS-Cov-2 infection and should not be used as the  sole basis for treatment or other patient management decisions. A negative result may occur with  improper specimen collection/handling, submission of specimen other than nasopharyngeal swab, presence of viral mutation(s) within the areas targeted by this assay, and inadequate number of viral copies(<138 copies/mL). A negative result must be combined with clinical observations, patient history, and epidemiological information. The expected result is Negative.  Fact Sheet for Patients:  bloggercourse.com  Fact Sheet for Healthcare Providers:  seriousbroker.it  This test is no t yet approved or cleared by the [place]  FDA and  has been authorized for detection and/or diagnosis of SARS-CoV-2 by FDA under an Emergency Use Authorization (EUA). This EUA will remain  in effect (meaning this test can be used) for the duration of the COVID-19 declaration under Section 564(b)(1) of the Act, 21 U.S.C.section 360bbb-3(b)(1), unless the authorization is terminated  or revoked sooner.       Influenza A by PCR NEGATIVE NEGATIVE Final   Influenza B by PCR NEGATIVE NEGATIVE Final    Comment: (NOTE) The Xpert Xpress SARS-CoV-2/FLU/RSV plus assay is intended as an aid in the diagnosis of influenza from Nasopharyngeal swab specimens and should not be used as a sole basis for treatment. Nasal washings and aspirates are unacceptable for Xpert Xpress SARS-CoV-2/FLU/RSV testing.  Fact Sheet for Patients: bloggercourse.com  Fact Sheet for Healthcare Providers: seriousbroker.it  This test is not yet approved or cleared by the [place]  FDA and has been authorized for detection and/or diagnosis of SARS-CoV-2 by FDA under an Emergency Use Authorization (EUA). This EUA will remain in effect (meaning this test can be used) for the duration of the COVID-19 declaration under Section 564(b)(1) of the  Act, 21 U.S.C. section 360bbb-3(b)(1), unless the authorization is terminated or revoked.     Resp Syncytial Virus by PCR NEGATIVE NEGATIVE Final    Comment: (NOTE) Fact Sheet for Patients: bloggercourse.com  Fact Sheet for Healthcare Providers: seriousbroker.it  This test is not yet approved or cleared by the [place]  FDA and has been authorized for detection and/or diagnosis of SARS-CoV-2 by FDA under an Emergency Use Authorization (EUA). This EUA will remain in effect (meaning this test can be used) for the duration of the COVID-19 declaration under Section 564(b)(1) of the Act, 21 U.S.C. section 360bbb-3(b)(1), unless the authorization is terminated or revoked.  Performed at [facility], [address] [place], [place] [address]   Culture, Respiratory w Gram Stain     Status: None   Collection Time: 02/10/24  7:56 AM  Specimen: Tracheal Aspirate; Respiratory  Result Value Ref Range Status   Specimen Description   Final    TRACHEAL ASPIRATE Performed at [facility], [address] [place], [place] [address]    Special Requests   Final    NONE Performed at [facility], [address] [place], [place] [address]    Gram Stain   Final    FEW WBC PRESENT,BOTH PMN AND MONONUCLEAR NO ORGANISMS SEEN    Culture   Final    NO GROWTH 2 DAYS Performed at [facility], [address] [place], [place] [address]    Report Status 02/12/2024 FINAL  Final  Body fluid culture w Gram Stain     Status: None   Collection Time: 02/11/24 12:25 PM   Specimen: Pleura; Body Fluid  Result Value Ref Range Status   Specimen Description   Final    PLEURAL Performed at [facility], [address] [place], [place] [address]    Special Requests   Final    NONE Performed at [facility], [address] [place], [place] [address]    Gram Stain   Final    WBC PRESENT,  PREDOMINANTLY MONONUCLEAR NO ORGANISMS SEEN    Culture   Final    NO GROWTH 3 DAYS Performed at [facility], [address], [place], [place] [address]    Report Status 02/15/2024 FINAL  Final  Resp panel by RT-PCR (RSV, Flu A&B, Covid) Anterior Nasal Swab     Status: None   Collection Time: 02/17/24  6:19 PM   Specimen: Anterior Nasal Swab  Result Value Ref Range Status   SARS Coronavirus 2 by RT PCR NEGATIVE NEGATIVE Final    Comment: (NOTE) SARS-CoV-2 target nucleic acids are NOT DETECTED.  The SARS-CoV-2 RNA is generally detectable in upper respiratory specimens during the acute phase of infection. The lowest concentration of SARS-CoV-2 viral copies this assay can detect is 138 copies/mL. A negative result does not preclude SARS-Cov-2 infection and should not be used as the sole basis for treatment or other patient management decisions. A negative result may occur with  improper specimen collection/handling, submission of specimen other than nasopharyngeal swab, presence of viral mutation(s) within the areas targeted by this assay, and inadequate number of viral copies(<138 copies/mL). A negative result must be combined with clinical observations, patient history, and epidemiological information. The expected result is Negative.  Fact Sheet for Patients:  bloggercourse.com  Fact Sheet for Healthcare Providers:  seriousbroker.it  This test is no t yet approved or cleared by the [place]  FDA and  has been authorized for detection and/or diagnosis of SARS-CoV-2 by FDA under an Emergency Use Authorization (EUA). This EUA will remain  in effect (meaning this test can be used) for the duration of the COVID-19 declaration under Section 564(b)(1) of the Act, 21 U.S.C.section 360bbb-3(b)(1), unless the authorization is terminated  or revoked sooner.       Influenza A by PCR NEGATIVE NEGATIVE Final   Influenza B by  PCR NEGATIVE NEGATIVE Final    Comment: (NOTE) The Xpert Xpress SARS-CoV-2/FLU/RSV plus assay is intended as an aid in the diagnosis of influenza from Nasopharyngeal swab specimens and should not be used as a sole basis for treatment. Nasal washings and aspirates are unacceptable for Xpert Xpress SARS-CoV-2/FLU/RSV testing.  Fact Sheet for Patients: bloggercourse.com  Fact Sheet for Healthcare Providers: seriousbroker.it  This test is not yet approved or cleared by the [place]  [place] FDA and has been authorized for detection and/or diagnosis of SARS-CoV-2 by FDA under an Emergency Use Authorization (EUA). This EUA will remain in effect (meaning this test can be used) for the duration of the COVID-19 declaration under Section 564(b)(1) of the Act, 21 U.S.C. section 360bbb-3(b)(1), unless the authorization is terminated or revoked.     Resp Syncytial Virus by PCR NEGATIVE NEGATIVE Final    Comment: (NOTE) Fact Sheet for Patients: bloggercourse.com  Fact Sheet for Healthcare Providers: seriousbroker.it  This test is not yet approved or cleared by the [place]  FDA and has been authorized for detection and/or diagnosis of SARS-CoV-2 by FDA under an Emergency Use Authorization (EUA). This EUA will remain in effect (meaning this test can be used) for the duration of the COVID-19 declaration under Section 564(b)(1) of the Act, 21 U.S.C. section 360bbb-3(b)(1), unless the authorization is terminated or revoked.  Performed at [facility], [address] [place], [place] [address]     Labs: CBC: Recent Labs  Lab 02/19/24 0500 02/20/24 0354 02/21/24 0504 02/22/24 0215 02/23/24 0518 02/24/24 0820  WBC 7.3 8.2 8.6 8.3 8.1 8.0  NEUTROABS 3.9 5.1 5.7 5.6 5.5  --   HGB 13.3 13.1 13.4 13.2 13.0 12.9  HCT 40.7 40.5 40.8 40.4 39.2 38.6  MCV 88.3 88.8 88.1 88.6 87.5  86.4  PLT 199 198 195 180 178 194   Basic Metabolic Panel: Recent Labs  Lab 02/19/24 0500 02/19/24 0940 02/20/24 0354 02/21/24 0504 02/22/24 0215 02/23/24 0000 02/23/24 0518 02/24/24 0820  NA 131*  --  131* 129* 125* 128* 128* 130*  K 6.1*   < > 4.8 5.5* 5.4* 3.9 4.2 4.3  CL 96*  --  95* 92* 89* 92* 92* 92*  CO2 25  --  27 27 26 26 25 27   GLUCOSE 128*  --  148* 164* 176* 103* 102* 141*  BUN 43*  --  28* 33* 40* 26* 28* 35*  CREATININE 2.37*  --  1.66* 1.82* 2.05* 1.36* 1.57* 1.80*  CALCIUM  9.3  --  8.8* 9.0 8.8* 8.6* 8.6* 8.8*  MG 2.4  --  2.0 2.1 2.4  --  2.2  --   PHOS 4.4  --  3.4 4.2 4.8*  --  3.9 4.2   < > = values in this interval not displayed.   Liver Function Tests: Recent Labs  Lab 02/20/24 0354 02/21/24 0504 02/22/24 0215 02/23/24 0518 02/24/24 0820  AST 54* 39 40 34 30  ALT 75* 64* 51* 45* 37  ALKPHOS 79 77 73 75 74  BILITOT 0.7 0.8 0.7 0.9 1.1  PROT 6.8 6.7 6.4* 6.5 6.3*  ALBUMIN 3.2* 3.4* 3.2* 3.3* 3.2*   CBG: Recent Labs  Lab 02/23/24 1133 02/23/24 1605 02/23/24 2119 02/24/24 0805 02/24/24 1209  GLUCAP 137* 127* 248* 137* 143*    Discharge time spent: Time Coordinating Discharge: I spent a total of 35 minutes engaged in face-to-face discussion with the patient and/or caregivers regarding the patient's care, assessment, plan, and discharge disposition. Over 50% of this time was dedicated to counseling the patient on the risks and benefits of treatment options and the discharge plan, as well as coordinating post-discharge care.   Signed: Madex Seals Al-Sultani, MD [facility] 02/24/2024

## 2024-02-24 NOTE — Op Note (Signed)
 OPERATIVE NOTE    PRE-OPERATIVE DIAGNOSIS: 1. ESRD   POST-OPERATIVE DIAGNOSIS: same as above  PROCEDURE: Ultrasound guidance for vascular access to the right internal jugular vein Fluoroscopic guidance for placement of catheter Placement of a 19 cm tip to cuff tunneled hemodialysis catheter via the right internal jugular vein  SURGEON: Selinda Gu, MD  ANESTHESIA:  Local   ESTIMATED BLOOD LOSS: 5 cc  FLUORO TIME: less than one minute  CONTRAST: none  FINDING(S): 1.  Patent right internal jugular vein  SPECIMEN(S):  None  INDICATIONS:   Deborah Dawson is a 73 y.o.female who presents with renal failure.  The patient needs long term dialysis access for their ESRD, and a Permcath is necessary.  Risks and benefits are discussed and informed consent is obtained.    DESCRIPTION: After obtaining full informed written consent, the patient was brought back to the vascular suited. The patient's right neck and chest were sterilely prepped and draped in a sterile surgical field was created. The right internal jugular vein was visualized with ultrasound and found to be patent. It was then accessed under direct ultrasound guidance and a permanent image was recorded. A wire was placed. After skin nick and dilatation, the peel-away sheath was placed over the wire. I then turned my attention to an area under the clavicle. Approximately 1-2 fingerbreadths below the clavicle a small counterincision was created and tunneled from the subclavicular incision to the access site. Using fluoroscopic guidance, a 19 centimeter tip to cuff tunneled hemodialysis catheter was selected, and tunneled from the subclavicular incision to the access site. It was then placed through the peel-away sheath and the peel-away sheath was removed. Using fluoroscopic guidance the catheter tips were parked in the right atrium. The appropriate distal connectors were placed. It withdrew blood well and flushed easily with heparinized  saline and a concentrated heparin  solution was then placed. It was secured to the chest wall with 2 Prolene sutures. The access incision was closed single 4-0 Monocryl. A 4-0 Monocryl pursestring suture was placed around the exit site. Sterile dressings were placed. The patient tolerated the procedure well and was taken to the recovery room in stable condition.  COMPLICATIONS: None  CONDITION: Stable  Selinda Gu, MD 02/24/2024 1:42 PM   This note was created with Dragon Medical transcription system. Any errors in dictation are purely unintentional.

## 2024-02-24 NOTE — Interval H&P Note (Signed)
 History and Physical Interval Note:  02/24/2024 1:26 PM  Deborah Dawson  has presented today for surgery, with the diagnosis of ESRD.  The various methods of treatment have been discussed with the patient and family. After consideration of risks, benefits and other options for treatment, the patient has consented to  Procedure(s): DIALYSIS/PERMA CATHETER INSERTION (N/A) as a surgical intervention.  The patient's history has been reviewed, patient examined, no change in status, stable for surgery.  I have reviewed the patient's chart and labs.  Questions were answered to the patient's satisfaction.     [name]

## 2024-02-24 NOTE — Progress Notes (Signed)
  Received patient in bed to unit.   Informed consent signed and in chart.    TX duration:2:15  Per CN pt off early d/t pt being transferred to [facility] hospital     Transported back to ICU Hand-off given to patient's nurse. No c/o and no acute distress noted    Access used: R HD permcath and R Femoral catheter  Multiple alarms throughout tx with high venous and arterial pressures.  Lines reversed with little improvement.  Opened femoral catheter and used venous port to return.  Used pts permcath venous port to pull.  NP aware.   Access issues: see above    Total UF removed: 1.3L Medication(s) given: none Post HD VS: wnl Post HD weight: 69.9kg     Olivia Hurst LPN [facility]

## 2024-02-24 NOTE — Progress Notes (Signed)
 [facility], [place] 02/24/24  Subjective:   Hospital day # 15  Patient seen sitting up in bed in ICU Alert and oriented 2L [place] Denies shortness of breath   Objective:  Vital signs in last 24 hours:  Temp:  [97.5 F (36.4 C)-98.4 F (36.9 C)] 98.4 F (36.9 C) (12/03 1053) Pulse Rate:  [50-98] 81 (12/03 1200) Resp:  [15-26] 15 (12/03 1200) BP: (112-151)/(68-97) 151/97 (12/03 1200) SpO2:  [92 %-100 %] 99 % (12/03 1320) Weight:  [68.3 kg] 68.3 kg (12/03 0420)  Weight change: -8.8 kg Filed Weights   02/22/24 2345 02/23/24 0500 02/24/24 0420  Weight: 75.1 kg 71 kg 68.3 kg    Intake/Output:    Intake/Output Summary (Last 24 hours) at 02/24/2024 1329 Last data filed at 02/24/2024 0300 Gross per 24 hour  Intake 100 ml  Output 500 ml  Net -400 ml     Physical Exam: General: NAD  HEENT Moist oral mucous membranes  Pulm/lungs decreased breath sounds, [place] O2  CVS/Heart paced  Abdomen:  Soft, nontender, nondistended  Extremities: Trace edema  Neurologic: Alert, able to answer questions appropriately  Skin: Warm, dry  Access: Rt femoral HD temp cath       Basic Metabolic Panel:  Recent Labs  Lab 02/19/24 0500 02/19/24 0940 02/20/24 0354 02/21/24 0504 02/22/24 0215 02/23/24 0000 02/23/24 0518 02/24/24 0820  NA 131*  --  131* 129* 125* 128* 128* 130*  K 6.1*   < > 4.8 5.5* 5.4* 3.9 4.2 4.3  CL 96*  --  95* 92* 89* 92* 92* 92*  CO2 25  --  27 27 26 26 25 27   GLUCOSE 128*  --  148* 164* 176* 103* 102* 141*  BUN 43*  --  28* 33* 40* 26* 28* 35*  CREATININE 2.37*  --  1.66* 1.82* 2.05* 1.36* 1.57* 1.80*  CALCIUM  9.3  --  8.8* 9.0 8.8* 8.6* 8.6* 8.8*  MG 2.4  --  2.0 2.1 2.4  --  2.2  --   PHOS 4.4  --  3.4 4.2 4.8*  --  3.9 4.2   < > = values in this interval not displayed.     CBC: Recent Labs  Lab 02/19/24 0500 02/20/24 0354 02/21/24 0504 02/22/24 0215 02/23/24 0518 02/24/24 0820  WBC 7.3 8.2 8.6 8.3 8.1 8.0  NEUTROABS  3.9 5.1 5.7 5.6 5.5  --   HGB 13.3 13.1 13.4 13.2 13.0 12.9  HCT 40.7 40.5 40.8 40.4 39.2 38.6  MCV 88.3 88.8 88.1 88.6 87.5 86.4  PLT 199 198 195 180 178 194      Lab Results  Component Value Date   HEPBSAG NON REACTIVE 02/14/2024      Microbiology:  Recent Results (from the past 240 hours)  Resp panel by RT-PCR (RSV, Flu A&B, Covid) Anterior Nasal Swab     Status: None   Collection Time: 02/17/24  6:19 PM   Specimen: Anterior Nasal Swab  Result Value Ref Range Status   SARS Coronavirus 2 by RT PCR NEGATIVE NEGATIVE Final    Comment: (NOTE) SARS-CoV-2 target nucleic acids are NOT DETECTED.  The SARS-CoV-2 RNA is generally detectable in upper respiratory specimens during the acute phase of infection. The lowest concentration of SARS-CoV-2 viral copies this assay can detect is 138 copies/mL. A negative result does not preclude SARS-Cov-2 infection and should not be used as the sole basis for treatment or other patient management decisions. A negative result may occur with  improper specimen collection/handling, submission of specimen other than nasopharyngeal swab, presence of viral mutation(s) within the areas targeted by this assay, and inadequate number of viral copies(<138 copies/mL). A negative result must be combined with clinical observations, patient history, and epidemiological information. The expected result is Negative.  Fact Sheet for Patients:  bloggercourse.com  Fact Sheet for Healthcare Providers:  seriousbroker.it  This test is no t yet approved or cleared by the [place]  FDA and  has been authorized for detection and/or diagnosis of SARS-CoV-2 by FDA under an Emergency Use Authorization (EUA). This EUA will remain  in effect (meaning this test can be used) for the duration of the COVID-19 declaration under Section 564(b)(1) of the Act, 21 U.S.C.section 360bbb-3(b)(1), unless the authorization is  terminated  or revoked sooner.       Influenza A by PCR NEGATIVE NEGATIVE Final   Influenza B by PCR NEGATIVE NEGATIVE Final    Comment: (NOTE) The Xpert Xpress SARS-CoV-2/FLU/RSV plus assay is intended as an aid in the diagnosis of influenza from Nasopharyngeal swab specimens and should not be used as a sole basis for treatment. Nasal washings and aspirates are unacceptable for Xpert Xpress SARS-CoV-2/FLU/RSV testing.  Fact Sheet for Patients: bloggercourse.com  Fact Sheet for Healthcare Providers: seriousbroker.it  This test is not yet approved or cleared by the [place]  FDA and has been authorized for detection and/or diagnosis of SARS-CoV-2 by FDA under an Emergency Use Authorization (EUA). This EUA will remain in effect (meaning this test can be used) for the duration of the COVID-19 declaration under Section 564(b)(1) of the Act, 21 U.S.C. section 360bbb-3(b)(1), unless the authorization is terminated or revoked.     Resp Syncytial Virus by PCR NEGATIVE NEGATIVE Final    Comment: (NOTE) Fact Sheet for Patients: bloggercourse.com  Fact Sheet for Healthcare Providers: seriousbroker.it  This test is not yet approved or cleared by the [place]  FDA and has been authorized for detection and/or diagnosis of SARS-CoV-2 by FDA under an Emergency Use Authorization (EUA). This EUA will remain in effect (meaning this test can be used) for the duration of the COVID-19 declaration under Section 564(b)(1) of the Act, 21 U.S.C. section 360bbb-3(b)(1), unless the authorization is terminated or revoked.  Performed at [facility], [address] [place], [place] [address]     Coagulation Studies: No results for input(s): LABPROT, INR in the last 72 hours.  Urinalysis: No results for input(s): COLORURINE, LABSPEC, PHURINE, GLUCOSEU, HGBUR,  BILIRUBINUR, KETONESUR, PROTEINUR, UROBILINOGEN, NITRITE, LEUKOCYTESUR in the last 72 hours.  Invalid input(s): APPERANCEUR    Imaging: DG Chest Port 1 View Result Date: 02/23/2024 CLINICAL DATA:  142230 Pleural effusion 142230 [id number] Status post thoracentesis [id number] EXAM: PORTABLE CHEST 1 VIEW COMPARISON:  IR THORACENTESIS, EARLIER SAME DAY. CHEST XR, 02/22/2024. CT CHEST, 02/10/2024. FINDINGS: Support lines; LEFT chest subclavian-approach pacer with intact dual leads. Mild enlargement of the cardiac silhouette. Aortic arch atherosclerosis. Improved aeration of the RIGHT lung post thoracentesis without residual pleural effusion. No pneumothorax. Similar well-aerated LEFT lung. No focal consolidation. No acute osseous abnormality. IMPRESSION: 1. Improved aeration of the RIGHT lung post thoracentesis without residual pleural effusion. No pneumothorax. 2. Cardiomegaly and Aortic Atherosclerosis (ICD10-I70.0). Electronically Signed   By: Thom Hall M.D.   On: 02/23/2024 13:35   US  THORACENTESIS ASP PLEURAL SPACE W/IMG GUIDE Result Date: 02/23/2024 INDICATION: Symptomatic RIGHT sided pleural effusion. Prior RIGHT thoracentesis 02/17/2024, with 1.2 L aspirated. EXAM: US  THORACENTESIS ASP PLEURAL SPACE W/IMG GUIDE COMPARISON:  Chest XR,  02/22/2024. MEDICATIONS: None. COMPLICATIONS: None immediate. TECHNIQUE: Informed written consent was obtained from the patient after a discussion of the risks, benefits and alternatives to treatment. A timeout was performed prior to the initiation of the procedure. Initial ultrasound scanning demonstrates a moderate-to-large volume RIGHT pleural effusion. The lower chest was prepped and draped in the usual sterile fashion. 1% lidocaine  was used for local anesthesia. An ultrasound image was saved for documentation purposes. A 6 Fr Safe-T-Centesis catheter was introduced. The thoracentesis was performed. The catheter was removed and a dressing was applied. The  patient tolerated the procedure well without immediate post procedural complication. The patient was escorted to have an upright chest radiograph. FINDINGS: A total of approximately 1550 mL of serosanguineous fluid was removed. Requested samples were sent to the laboratory. IMPRESSION: Successful ultrasound-guided diagnostic and therapeutic RIGHT thoracentesis yielding 1550 mL of pleural fluid. Thom Hall, MD [facility] Electronically Signed   By: Thom Hall M.D.   On: 02/23/2024 11:54   DG Chest Port 1 View Result Date: 02/22/2024 EXAM: 1 VIEW(S) XRAY OF THE CHEST 02/22/2024 05:12:34 PM COMPARISON: 02/17/2024 CLINICAL HISTORY: Dyspnea FINDINGS: LINES, TUBES AND DEVICES: Multiple wires and leads project over the chest on the frontal radiograph. Dual pacer is present and changed. LUNGS AND PLEURA: Moderate right pleural effusion increased. Right mid and lower lung airspace disease, most likely atelectasis. No pneumothorax. HEART AND MEDIASTINUM: Moderate cardiomegaly with mild interstitial edema. BONES AND SOFT TISSUES: No acute osseous abnormality. IMPRESSION: 1. Worsened right mid and lower lung airspace disease, most likely atelectasis. 2. Reaccumulation of moderate right pleural effusion. 3. Cardiomegaly with mild interstitial edema. Electronically signed by: Rockey Kilts MD 02/22/2024 05:59 PM EST RP Workstation: [id number]       Medications:    sodium chloride       ceFAZolin  (ANCEF ) IV 1 g (02/24/24 1318)     [MAR Hold] vitamin C   500 mg Oral BID   [MAR Hold] carvedilol   6.25 mg Oral BID WC   [MAR Hold] Chlorhexidine  Gluconate Cloth  6 each Topical Q0600   [MAR Hold] feeding supplement (NEPRO CARB STEADY)  237 mL Oral BID BM   [MAR Hold] furosemide   80 mg Intravenous Q12H   [MAR Hold] gabapentin   100 mg Oral BID   [MAR Hold] insulin  aspart  0-6 Units Subcutaneous TID WC   [MAR Hold] lidocaine   1 patch Transdermal Q24H   [MAR  Hold] multivitamin  1 tablet Oral QHS   [MAR Hold] multivitamin with minerals  1 tablet Oral Daily   [MAR Hold] mouth rinse  15 mL Mouth Rinse 4 times per day   [MAR Hold] pantoprazole   40 mg Oral Daily   [MAR Hold] sodium chloride  flush  3 mL Intravenous Q12H   [MAR Hold] acetaminophen , [MAR Hold] benzonatate , Heparin  (Porcine) in NaCl, heparin , [MAR Hold] hydrALAZINE , [MAR Hold] HYDROcodone  bit-homatropine, [MAR Hold] hydrocortisone  cream, [MAR Hold] ipratropium-albuterol , lidocaine -EPINEPHrine  (PF), [MAR Hold] menthol , [MAR Hold] metoCLOPramide  (REGLAN ) injection, [MAR Hold] mouth rinse, [MAR Hold] oxyCODONE , [MAR Hold] polyethylene glycol, [MAR Hold] prochlorperazine , [MAR Hold] promethazine , [MAR Hold] sodium chloride   Assessment/ Plan:  73 y.o. female with  medical problems of   PAF, PE on Xarelto , chronic HFrEF LVEF 25% with recurrent right-sided pleural effusion, PPM-ICD, HTN, IDDM, gout   admitted on 02/09/2024 for Cardiac arrest (HCC) [I46.9] CAP (community acquired pneumonia) [J18.9] Dyspnea, unspecified type [R06.00] Community acquired pneumonia, unspecified laterality [J18.9]  Complicated by PEA cardiac arrest, pulmonary embolism, large right pleural effusion,  parainfluenza virus 3   AKI volume overload Acute kidney injury, oliguric, likely due to to ATN secondary to hypotension, IV contrast exposure, possible pneumonia. Pneumonia treated with Unasyn  Renal US  negative for hydronephrosis. Developed respiratory arrest after fentanyl  and Versed  for PermCath placement  Plan: Due to scheduling, permcath will be placed today. Will plan for dialysis later today. Discussed the likely need daily dialysis to manage fluid volume. Will monitor daily   Hyperkalemia Potassium 4.3, stable   Acute metabolic acidosis Serum bicarbonate within optimal range   Hyponatremia Serum sodium 130   Chronic kidney disease, stage IIIb.  Baseline creatinine 1.4/GFR 38 from 02/10/2024.  CKD likely  secondary to diabetic kidney disease. Receiving hemodialysis and monitoring for renal recovery   Comorbidities Chronic systolic CHF-2D echo from 02/10/2024 show LVEF 30 to 35%, global hypokinesis, moderately reduced right ventricular systolic function   Acute pulmonary embolism-patient is currently on apixaban  5 mg twice a day, this has been held.   Right pleural effusion- Pulmonary and cardiac teams are following.  Recurrent pleural effusion now.  Thoracentesis being planned today.      LOS: [address] [facility] 12/3/20251:29 PM  [address] [place], [place] [phone]

## 2024-02-24 NOTE — Progress Notes (Signed)
 PT Cancellation Note  Patient Details Name: Deborah Dawson MRN: [id number] DOB: 07-06-1950   Cancelled Treatment:    Reason Eval/Treat Not Completed: Patient at procedure or test/unavailable. New orders received however pt off floor for procedure. PT to re-attempt at a later time/date.    [name]. Fairly IV, PT, DPT Physical Therapist- [place]  [facility] 02/24/2024, 12:51 PM

## 2024-02-24 NOTE — Progress Notes (Signed)
 Patient to HD

## 2024-02-25 LAB — MISC LABCORP TEST (SEND OUT): Labcorp test code: 1057

## 2024-02-25 NOTE — Progress Notes (Signed)
 Report called to [facility], RN Cardiology dept at [facility]. [phone] room [phone]

## 2024-02-26 LAB — CHOLESTEROL, BODY FLUID: Cholesterol, Fluid: 34 mg/dL

## 2024-02-29 LAB — GLUCOSE, CAPILLARY: Glucose-Capillary: 134 mg/dL — ABNORMAL HIGH (ref 70–99)

## 2024-03-01 NOTE — Progress Notes (Addendum)
 Nephrology Consult Progress Note  Interval History   Patient seen at bedside this morning. Denies dyspnea.  Cr 2.1 --> 2.0 --> 2.1 mg/dL Chart urine output in the last 24 hr: 400 cc with IV Lasix  80 mg  Dialysis Treatments   Current Hemodialysis Prescription: Dialyzer: Revacle[facility] Dialysis Bath: 3K with 2.5Ca (RN driven protocol - K+ is 4.2 on 02/26/24) Na: Sodium: 140 HCO3: Bicarbonate: 35 Dialysis Access:   BFR: Blood Flow Rate (+/- 50 mL/min): 450 DFR: Dialysate Flow Rate (mL/min): 800 mL/min Heparin : Not ordered (Patient is on a continuous Heparin  gtt) Pre-treatment weight: Pre-Treatment Weight (kg): 66.3 Estimated Dry Weight: Estimated Dry Weight (kg):  (to be established) Goal Patient Fluid Removal: Patient Fluid Removal Programmed (liters): 2.4 (UF goal 2L + 0.4 rinseback)  Pre-Treatment Weight (kg): 66.3 Post-Treatment Weight (kg): 64.2 UF volume: -2.1L  Medications    apixaban   5 mg Oral Q12H SCH   carvediloL   3.125 mg Oral BID CC   heparin  (porcine)   Intracatheter As Directed Admin   insulin  GLARGINE-yfgn  5 Units Subcutaneous QHS   insulin  LISPRO (AdmeLOG, HumaLOG) injection  0-4 Units Subcutaneous TID CC   lidocaine   1 patch Transdermal Q24H   melatonin  3 mg Oral QHS   traZODone   50 mg Oral QHS    Objective     Current Vital Signs 24h Vital Sign Ranges  T 36.3 C (97.4 F) Temp  Avg: 36.3 C (97.4 F)  Min: 36.3 C (97.3 F)  Max: 36.3 C (97.4 F)  BP (!) 123/91 BP  Min: 114/74  Max: 128/81  HR 80 Pulse  Avg: 80.8  Min: 77  Max: 85  RR 18 Resp  Avg: 18  Min: 18  Max: 18  SaO2 94 % Nasal cannula SpO2  Avg: 96 %  Min: 94 %  Max: 98 %          Ins & Outs I/O last 2 completed shifts: In: 1545.1 [P.O.:1260; I.V.:285.1] Out: 400 [Urine:400] Current Shift:  12/09 0701 - 12/09 1900 In: -  Out: 100 [Urine:100]   Physical Exam Gen:                NAD CV:                  normal rate Pulm:               Normal WOB on RA Abd:                  Soft, NTND MSK:               No pedal edema  Access:           RIJ perm cath dressed, C/D/I  Labs   Recent Labs  Lab 02/28/24 0551 02/29/24 0746 03/01/24 0637  WBC 7.6 7.0 6.1  HGB 12.8 13.4 13.2  PLT 206 216 174   Recent Labs  Lab 02/25/24 0623 02/26/24 0344 02/28/24 0551 02/29/24 0746 03/01/24 0637  NA 128*   < > 128* 128* 123*  K 4.0   < > 3.8 3.7 4.4  CL 94*   < > 93* 91* 90*  CO2 25   < > 24 24 23   BUN 25*   < > 28* 38* 47*  CREATININE 1.5*   < > 2.1* 2.0* 2.1*  CALCIUM  8.5*   < > 8.8 9.0 8.8  ALKPHOS 63  --   --   --   --  GLUCOSE 162*   < > 183* 145* 121   < > = values in this interval not displayed.   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   Baseline creatinine: 1.2-1.4. Started on dialysis at outside hospital on 11/23. Etiology of AKI is likely multifactorial most concerning for hemodynamically mediated ATN in the setting of PEA arrest further compounded by decompensated heart failure.   Oligoanuric AKI on CKD IIIa requiring KRT No emergent indication for KRT at present Last iHD session on 12/5. Interval Cr trend is stable, suggestive of some renal recovery.  Appears clinically euvolemic, ok to hold additional IV diuretics MD eval for iHD on 12/10 pending trend Consent signed and in HD unit  Standard Recommendations   Please dose all medications for reduced eGFR. Avoid NSAIDs, morphine , and baclofen. Avoid magnesium -containing laxatives (milk of magnesia, magnesium  hydroxide)  and enemas (SMOG). Avoid phosphate-containing laxative (sodium phosphate) enemas (Fleet phospho-soda). Use caution when administering iodinated contrast with eGFR < 30  Thank you for this consult. We will continue to follow the patient with you.  Please feel free to call with questions or concerns.   Jasmin Tharakan, MD Nephrology Fellow [facility] service pager [phone]   The Nephrology Consult team is available in-house from 7A-5P. If urgent assistance is needed outside of these hours, please page the Nephrology on-call pager [phone] for assistance. The Nephrology consult pagers are available after business hours for emergencies only.    ------------------------------------------------------------------------------- Attestation signed by Vickie Kung, MD at 03/01/2024  4:31 PM Attestation Statement:   I personally saw and evaluated the patient, and participated in the management and treatment plan as documented in the resident/fellow note.  Monitoring for renal recovery   KUNG VICKIE, MD  -------------------------------------------------------------------------------

## 2024-03-13 NOTE — Progress Notes (Signed)
 Patient ID: Deborah Dawson, female   DOB: 12-May-1950, 73 y.o.   MRN: [id number] [facility] Cardiology    SUBJECTIVE: Patient states to be doing reasonably well improved shortness of breath no pain resting comfortably in bed no fevers she has also had no palpitations or tachycardia   Vitals:   01/30/24 0022 01/30/24 0451 01/30/24 0500 01/30/24 0745  BP: (!) 153/60 (!) 155/90  (!) 171/86  Pulse: 60 60  60  Resp: 20 20    Temp: 98.4 F (36.9 C) 98.4 F (36.9 C)  97.6 F (36.4 C)  TempSrc:      SpO2: 95% 94%  95%  Weight:   67.3 kg   Height:        No intake or output data in the 24 hours ending 03/13/24 1250    PHYSICAL EXAM  General: Well developed, well nourished, in no acute distress HEENT:  Normocephalic and atramatic Neck:  No JVD.  Lungs: Clear bilaterally to auscultation and percussion. Heart: HRRR . Normal S1 and S2 without gallops or murmurs.  Abdomen: Bowel sounds are positive, abdomen soft and non-tender  Msk:  Back normal, normal gait. Normal strength and tone for age. Extremities: No clubbing, cyanosis or edema.   Neuro: Alert and oriented X 3. Psych:  Good affect, responds appropriately   LABS: Basic Metabolic Panel: No results for input(s): NA, K, CL, CO2, GLUCOSE, BUN, CREATININE, CALCIUM , MG, PHOS in the last 72 hours. Liver Function Tests: No results for input(s): AST, ALT, ALKPHOS, BILITOT, PROT, ALBUMIN in the last 72 hours. No results for input(s): LIPASE, AMYLASE in the last 72 hours. CBC: No results for input(s): WBC, NEUTROABS, HGB, HCT, MCV, PLT in the last 72 hours. Cardiac Enzymes: No results for input(s): CKTOTAL, CKMB, CKMBINDEX, TROPONINI in the last 72 hours. BNP: Invalid input(s): POCBNP D-Dimer: No results for input(s): DDIMER in the last 72 hours. Hemoglobin A1C: No results for input(s): HGBA1C in the last 72 hours. Fasting Lipid Panel: No results for input(s): CHOL, HDL,  LDLCALC, TRIG, CHOLHDL, LDLDIRECT in the last 72 hours. Thyroid Function Tests: No results for input(s): TSH, T4TOTAL, T3FREE, THYROIDAB in the last 72 hours.  Invalid input(s): FREET3 Anemia Panel: No results for input(s): VITAMINB12, FOLATE, FERRITIN, TIBC, IRON, RETICCTPCT in the last 72 hours.  No results found.   Echo severely depressed left ventricular function EF of 25%  TELEMETRY: Paced rhythm 70 nonspecific findings:  ASSESSMENT AND PLAN:  Principal Problem:   Acute renal failure Active Problems:   Acute hypoxic respiratory failure (HCC)   Acute pulmonary embolism (HCC)   Recurrent right pleural effusion   Acute on chronic systolic CHF (congestive heart failure) (HCC)   Paroxysmal atrial fibrillation (HCC)   Uncontrolled type 2 diabetes mellitus with hyperglycemia, with long-term current use of insulin  (HCC)    Plan Paroxysmal atrial fibrillation EKG now shows paced rhythm continue current therapy Acute renal insufficiency on chronic renal insufficiency maintain adequate hydration recommend nephrology input hold off on Xarelto  consider switching to low-dose Eliquis  Acute on chronic systolic heart failure with impaired renal function hold Aldactone  recommend adequate perfusion Pleural effusion on the right status post thoracentesis on 11 4 with improved breathing and shortness of breath chest x-ray with complete resolution cytology still pending History of acute known obstructive pulmonary embolus currently on heparin  will transition to Eliquis  for outpatient management Diabetes type 2 A1c is 8 will try to obtain A1c less than 7   Deborah JONETTA Lovelace, MD, 03/13/2024 12:50 PM

## 2024-03-21 NOTE — Progress Notes (Signed)
 Patient ID: Deborah Dawson, female   DOB: September 04, 1950, 73 y.o.   MRN: [id number] [facility]    SUBJECTIVE: Patient denies any worsening shortness of breath no leg swelling resting comfortably in bed ambulating without difficulty no cough no sputum production no chest pain denies any significant palpitations or tachycardia   Vitals:   01/30/24 0022 01/30/24 0451 01/30/24 0500 01/30/24 0745  BP: (!) 153/60 (!) 155/90  (!) 171/86  Pulse: 60 60  60  Resp: 20 20    Temp: 98.4 F (36.9 C) 98.4 F (36.9 C)  97.6 F (36.4 C)  TempSrc:      SpO2: 95% 94%  95%  Weight:   67.3 kg   Height:        No intake or output data in the 24 hours ending 03/21/24 1318    PHYSICAL EXAM  General: Well developed, well nourished, in no acute distress HEENT:  Normocephalic and atramatic Neck:  No JVD.  Lungs: Clear bilaterally to auscultation and percussion. Heart: HRRR . Normal S1 and S2 without gallops or murmurs.  Abdomen: Bowel sounds are positive, abdomen soft and non-tender  Msk:  Back normal, normal gait. Normal strength and tone for age. Extremities: No clubbing, cyanosis or edema.   Neuro: Alert and oriented X 3. Psych:  Good affect, responds appropriately   LABS: Basic Metabolic Panel: No results for input(s): NA, K, CL, CO2, GLUCOSE, BUN, CREATININE, CALCIUM , MG, PHOS in the last 72 hours. Liver Function Tests: No results for input(s): AST, ALT, ALKPHOS, BILITOT, PROT, ALBUMIN in the last 72 hours. No results for input(s): LIPASE, AMYLASE in the last 72 hours. CBC: No results for input(s): WBC, NEUTROABS, HGB, HCT, MCV, PLT in the last 72 hours. Cardiac Enzymes: No results for input(s): CKTOTAL, CKMB, CKMBINDEX, TROPONINI in the last 72 hours. BNP: Invalid input(s): POCBNP D-Dimer: No results for input(s): DDIMER in the last 72 hours. Hemoglobin A1C: No results for input(s): HGBA1C in the last 72 hours. Fasting Lipid  Panel: No results for input(s): CHOL, HDL, LDLCALC, TRIG, CHOLHDL, LDLDIRECT in the last 72 hours. Thyroid Function Tests: No results for input(s): TSH, T4TOTAL, T3FREE, THYROIDAB in the last 72 hours.  Invalid input(s): FREET3 Anemia Panel: No results for input(s): VITAMINB12, FOLATE, FERRITIN, TIBC, IRON, RETICCTPCT in the last 72 hours.  No results found.   Echo depressed left ventricular function EF around 25% globally  TELEMETRY: Paced rhythm 65:  ASSESSMENT AND PLAN:  Principal Problem:   Acute renal failure Active Problems:   Acute hypoxic respiratory failure (HCC)   Acute pulmonary embolism (HCC)   Recurrent right pleural effusion   Acute on chronic systolic CHF (congestive heart failure) (HCC)   Paroxysmal atrial fibrillation (HCC)   Uncontrolled type 2 diabetes mellitus with hyperglycemia, with long-term current use of insulin  (HCC)    Plan Atrial fibrillation continue anticoagulation and rate control transition from heparin  to Xarelto  continue carvedilol  for rate Pulmonary emboli continue anticoagulation heparin  to Xarelto  for 3 to 6 months follow-up with pulmonary Acute on chronic HFrEF recommend GDMT Coreg  diuretics MRA is on hold because of worsening renal insufficiency unable to tolerate Entresto due to cough and SGLT2 contraindicated because of recurrent UTIs  Hypertension continue Coreg  consider adding Imdur  hydralazine  continue amlodipine  Currently CRT-P in place with outpatient follow-up with device clinic Diabetes continue diabetes management and control Have the patient follow-up with cardiology 2 to 4 weeks Follow-up with heart failure clinic within 1 week of discharge  Deborah JONETTA Lovelace, MD, 03/21/2024  1:18 PM

## 2024-04-19 ENCOUNTER — Encounter: Payer: Self-pay | Admitting: *Deleted

## 2024-04-19 ENCOUNTER — Inpatient Hospital Stay
Admission: EM | Admit: 2024-04-19 | Discharge: 2024-04-24 | DRG: 291 | Disposition: E | Attending: Obstetrics and Gynecology | Admitting: Obstetrics and Gynecology

## 2024-04-19 ENCOUNTER — Other Ambulatory Visit: Payer: Self-pay

## 2024-04-19 ENCOUNTER — Emergency Department

## 2024-04-19 DIAGNOSIS — Z950 Presence of cardiac pacemaker: Secondary | ICD-10-CM

## 2024-04-19 DIAGNOSIS — Z888 Allergy status to other drugs, medicaments and biological substances status: Secondary | ICD-10-CM

## 2024-04-19 DIAGNOSIS — Z1152 Encounter for screening for COVID-19: Secondary | ICD-10-CM

## 2024-04-19 DIAGNOSIS — Z8249 Family history of ischemic heart disease and other diseases of the circulatory system: Secondary | ICD-10-CM

## 2024-04-19 DIAGNOSIS — N2581 Secondary hyperparathyroidism of renal origin: Secondary | ICD-10-CM | POA: Diagnosis present

## 2024-04-19 DIAGNOSIS — Z66 Do not resuscitate: Secondary | ICD-10-CM | POA: Diagnosis present

## 2024-04-19 DIAGNOSIS — Z79899 Other long term (current) drug therapy: Secondary | ICD-10-CM

## 2024-04-19 DIAGNOSIS — Z9049 Acquired absence of other specified parts of digestive tract: Secondary | ICD-10-CM

## 2024-04-19 DIAGNOSIS — E8770 Fluid overload, unspecified: Secondary | ICD-10-CM | POA: Diagnosis present

## 2024-04-19 DIAGNOSIS — R7989 Other specified abnormal findings of blood chemistry: Secondary | ICD-10-CM | POA: Diagnosis present

## 2024-04-19 DIAGNOSIS — I480 Paroxysmal atrial fibrillation: Secondary | ICD-10-CM | POA: Diagnosis present

## 2024-04-19 DIAGNOSIS — D631 Anemia in chronic kidney disease: Secondary | ICD-10-CM | POA: Diagnosis present

## 2024-04-19 DIAGNOSIS — N186 End stage renal disease: Secondary | ICD-10-CM | POA: Diagnosis present

## 2024-04-19 DIAGNOSIS — Z992 Dependence on renal dialysis: Secondary | ICD-10-CM

## 2024-04-19 DIAGNOSIS — Z90710 Acquired absence of both cervix and uterus: Secondary | ICD-10-CM

## 2024-04-19 DIAGNOSIS — Z833 Family history of diabetes mellitus: Secondary | ICD-10-CM

## 2024-04-19 DIAGNOSIS — I4819 Other persistent atrial fibrillation: Secondary | ICD-10-CM | POA: Diagnosis present

## 2024-04-19 DIAGNOSIS — Z883 Allergy status to other anti-infective agents status: Secondary | ICD-10-CM

## 2024-04-19 DIAGNOSIS — E871 Hypo-osmolality and hyponatremia: Secondary | ICD-10-CM | POA: Diagnosis present

## 2024-04-19 DIAGNOSIS — M109 Gout, unspecified: Secondary | ICD-10-CM | POA: Diagnosis present

## 2024-04-19 DIAGNOSIS — I428 Other cardiomyopathies: Secondary | ICD-10-CM | POA: Diagnosis present

## 2024-04-19 DIAGNOSIS — E119 Type 2 diabetes mellitus without complications: Secondary | ICD-10-CM

## 2024-04-19 DIAGNOSIS — Z7901 Long term (current) use of anticoagulants: Secondary | ICD-10-CM

## 2024-04-19 DIAGNOSIS — Z91041 Radiographic dye allergy status: Secondary | ICD-10-CM

## 2024-04-19 DIAGNOSIS — I5023 Acute on chronic systolic (congestive) heart failure: Secondary | ICD-10-CM | POA: Diagnosis present

## 2024-04-19 DIAGNOSIS — Z86711 Personal history of pulmonary embolism: Secondary | ICD-10-CM

## 2024-04-19 DIAGNOSIS — Z794 Long term (current) use of insulin: Secondary | ICD-10-CM

## 2024-04-19 DIAGNOSIS — R112 Nausea with vomiting, unspecified: Principal | ICD-10-CM

## 2024-04-19 DIAGNOSIS — E1122 Type 2 diabetes mellitus with diabetic chronic kidney disease: Secondary | ICD-10-CM | POA: Diagnosis present

## 2024-04-19 DIAGNOSIS — Z882 Allergy status to sulfonamides status: Secondary | ICD-10-CM

## 2024-04-19 DIAGNOSIS — Z881 Allergy status to other antibiotic agents status: Secondary | ICD-10-CM

## 2024-04-19 DIAGNOSIS — I132 Hypertensive heart and chronic kidney disease with heart failure and with stage 5 chronic kidney disease, or end stage renal disease: Principal | ICD-10-CM | POA: Diagnosis present

## 2024-04-19 DIAGNOSIS — I462 Cardiac arrest due to underlying cardiac condition: Secondary | ICD-10-CM | POA: Diagnosis not present

## 2024-04-19 DIAGNOSIS — Z803 Family history of malignant neoplasm of breast: Secondary | ICD-10-CM

## 2024-04-19 DIAGNOSIS — R0600 Dyspnea, unspecified: Secondary | ICD-10-CM

## 2024-04-19 DIAGNOSIS — E875 Hyperkalemia: Secondary | ICD-10-CM | POA: Diagnosis present

## 2024-04-19 DIAGNOSIS — I255 Ischemic cardiomyopathy: Secondary | ICD-10-CM | POA: Diagnosis present

## 2024-04-19 DIAGNOSIS — Z8674 Personal history of sudden cardiac arrest: Secondary | ICD-10-CM

## 2024-04-19 DIAGNOSIS — E11649 Type 2 diabetes mellitus with hypoglycemia without coma: Secondary | ICD-10-CM | POA: Diagnosis present

## 2024-04-19 DIAGNOSIS — Z823 Family history of stroke: Secondary | ICD-10-CM

## 2024-04-19 DIAGNOSIS — J9601 Acute respiratory failure with hypoxia: Secondary | ICD-10-CM | POA: Diagnosis present

## 2024-04-19 DIAGNOSIS — I5084 End stage heart failure: Secondary | ICD-10-CM | POA: Diagnosis present

## 2024-04-19 DIAGNOSIS — J90 Pleural effusion, not elsewhere classified: Secondary | ICD-10-CM | POA: Diagnosis present

## 2024-04-19 LAB — CBC
HCT: 33.6 % — ABNORMAL LOW (ref 36.0–46.0)
Hemoglobin: 11 g/dL — ABNORMAL LOW (ref 12.0–15.0)
MCH: 30.1 pg (ref 26.0–34.0)
MCHC: 32.7 g/dL (ref 30.0–36.0)
MCV: 91.8 fL (ref 80.0–100.0)
Platelets: 227 10*3/uL (ref 150–400)
RBC: 3.66 MIL/uL — ABNORMAL LOW (ref 3.87–5.11)
RDW: 19.4 % — ABNORMAL HIGH (ref 11.5–15.5)
WBC: 8.3 10*3/uL (ref 4.0–10.5)
nRBC: 0.6 % — ABNORMAL HIGH (ref 0.0–0.2)

## 2024-04-19 LAB — BASIC METABOLIC PANEL WITH GFR
Anion gap: 15 (ref 5–15)
BUN: 37 mg/dL — ABNORMAL HIGH (ref 8–23)
CO2: 23 mmol/L (ref 22–32)
Calcium: 9.3 mg/dL (ref 8.9–10.3)
Chloride: 93 mmol/L — ABNORMAL LOW (ref 98–111)
Creatinine, Ser: 3.05 mg/dL — ABNORMAL HIGH (ref 0.44–1.00)
GFR, Estimated: 15 mL/min — ABNORMAL LOW
Glucose, Bld: 131 mg/dL — ABNORMAL HIGH (ref 70–99)
Potassium: 5.7 mmol/L — ABNORMAL HIGH (ref 3.5–5.1)
Sodium: 132 mmol/L — ABNORMAL LOW (ref 135–145)

## 2024-04-19 LAB — PROTIME-INR
INR: 1.6 — ABNORMAL HIGH (ref 0.8–1.2)
Prothrombin Time: 20.4 s — ABNORMAL HIGH (ref 11.4–15.2)

## 2024-04-19 LAB — TROPONIN T, HIGH SENSITIVITY: Troponin T High Sensitivity: 162 ng/L (ref 0–19)

## 2024-04-19 NOTE — ED Triage Notes (Addendum)
 Pt to triage via wheelchair.  Pt is dialysis pt.  Pt has vomiting of bile that began today.  Pt reports near syncope and fell and hit head on floor today.  Pt is on blood thinners Pt released from [facility] last week hx chf.  Pt pale   last dialysis was 04/16/24

## 2024-04-20 ENCOUNTER — Inpatient Hospital Stay: Admit: 2024-04-20 | Discharge: 2024-04-20 | Disposition: A | Attending: Hospitalist

## 2024-04-20 ENCOUNTER — Emergency Department

## 2024-04-20 DIAGNOSIS — E119 Type 2 diabetes mellitus without complications: Secondary | ICD-10-CM

## 2024-04-20 DIAGNOSIS — Z8674 Personal history of sudden cardiac arrest: Secondary | ICD-10-CM

## 2024-04-20 DIAGNOSIS — E8770 Fluid overload, unspecified: Secondary | ICD-10-CM | POA: Diagnosis not present

## 2024-04-20 DIAGNOSIS — J9601 Acute respiratory failure with hypoxia: Secondary | ICD-10-CM | POA: Diagnosis not present

## 2024-04-20 DIAGNOSIS — I5023 Acute on chronic systolic (congestive) heart failure: Secondary | ICD-10-CM | POA: Diagnosis not present

## 2024-04-20 DIAGNOSIS — J90 Pleural effusion, not elsewhere classified: Secondary | ICD-10-CM | POA: Diagnosis not present

## 2024-04-20 DIAGNOSIS — I428 Other cardiomyopathies: Secondary | ICD-10-CM

## 2024-04-20 DIAGNOSIS — Z992 Dependence on renal dialysis: Secondary | ICD-10-CM

## 2024-04-20 DIAGNOSIS — N186 End stage renal disease: Secondary | ICD-10-CM

## 2024-04-20 DIAGNOSIS — Z86711 Personal history of pulmonary embolism: Secondary | ICD-10-CM | POA: Diagnosis present

## 2024-04-20 DIAGNOSIS — E1122 Type 2 diabetes mellitus with diabetic chronic kidney disease: Secondary | ICD-10-CM | POA: Diagnosis not present

## 2024-04-20 DIAGNOSIS — I132 Hypertensive heart and chronic kidney disease with heart failure and with stage 5 chronic kidney disease, or end stage renal disease: Principal | ICD-10-CM

## 2024-04-20 LAB — RENAL FUNCTION PANEL
Albumin: 3.5 g/dL (ref 3.5–5.0)
Anion gap: 14 (ref 5–15)
BUN: 43 mg/dL — ABNORMAL HIGH (ref 8–23)
CO2: 24 mmol/L (ref 22–32)
Calcium: 9.2 mg/dL (ref 8.9–10.3)
Chloride: 96 mmol/L — ABNORMAL LOW (ref 98–111)
Creatinine, Ser: 3.41 mg/dL — ABNORMAL HIGH (ref 0.44–1.00)
GFR, Estimated: 13 mL/min — ABNORMAL LOW
Glucose, Bld: 43 mg/dL — CL (ref 70–99)
Phosphorus: 5.4 mg/dL — ABNORMAL HIGH (ref 2.5–4.6)
Potassium: 6 mmol/L — ABNORMAL HIGH (ref 3.5–5.1)
Sodium: 134 mmol/L — ABNORMAL LOW (ref 135–145)

## 2024-04-20 LAB — HEPATIC FUNCTION PANEL
ALT: 30 U/L (ref 0–44)
AST: 33 U/L (ref 15–41)
Albumin: 3.6 g/dL (ref 3.5–5.0)
Alkaline Phosphatase: 79 U/L (ref 38–126)
Bilirubin, Direct: 0.4 mg/dL — ABNORMAL HIGH (ref 0.0–0.2)
Indirect Bilirubin: 0.3 mg/dL (ref 0.3–0.9)
Total Bilirubin: 0.7 mg/dL (ref 0.0–1.2)
Total Protein: 7.1 g/dL (ref 6.5–8.1)

## 2024-04-20 LAB — PROTIME-INR
INR: 1.5 — ABNORMAL HIGH (ref 0.8–1.2)
Prothrombin Time: 19.1 s — ABNORMAL HIGH (ref 11.4–15.2)

## 2024-04-20 LAB — APTT
aPTT: 43 s — ABNORMAL HIGH (ref 24–36)
aPTT: 147 s — ABNORMAL HIGH (ref 24–36)

## 2024-04-20 LAB — HEPARIN LEVEL (UNFRACTIONATED): Heparin Unfractionated: >1.1 [IU]/mL — ABNORMAL HIGH (ref 0.30–0.70)

## 2024-04-20 LAB — ECHOCARDIOGRAM COMPLETE
AR max vel: 1.28 cm2
AV Area VTI: 1.44 cm2
AV Area mean vel: 1.35 cm2
AV Mean grad: 1 mmHg
AV Peak grad: 2 mmHg
Ao pk vel: 0.71 m/s
Calc EF: 10.6 %
Est EF: <20
Height: 61 in
MV M vel: 4.69 m/s
MV Peak grad: 88 mmHg
S' Lateral: 4.3 cm
Single Plane A2C EF: 13.9 %
Single Plane A4C EF: 11.2 %
Weight: 1984 [oz_av]

## 2024-04-20 LAB — CBG MONITORING, ED
Glucose-Capillary: 44 mg/dL — CL (ref 70–99)
Glucose-Capillary: 69 mg/dL — ABNORMAL LOW (ref 70–99)
Glucose-Capillary: 74 mg/dL (ref 70–99)

## 2024-04-20 LAB — CBC
HCT: 32.4 % — ABNORMAL LOW (ref 36.0–46.0)
Hemoglobin: 10.5 g/dL — ABNORMAL LOW (ref 12.0–15.0)
MCH: 29.2 pg (ref 26.0–34.0)
MCHC: 32.4 g/dL (ref 30.0–36.0)
MCV: 90 fL (ref 80.0–100.0)
Platelets: 228 10*3/uL (ref 150–400)
RBC: 3.6 MIL/uL — ABNORMAL LOW (ref 3.87–5.11)
RDW: 19.3 % — ABNORMAL HIGH (ref 11.5–15.5)
WBC: 7.2 10*3/uL (ref 4.0–10.5)
nRBC: 0.4 % — ABNORMAL HIGH (ref 0.0–0.2)

## 2024-04-20 LAB — PRO BRAIN NATRIURETIC PEPTIDE: Pro Brain Natriuretic Peptide: >35000 pg/mL — ABNORMAL HIGH

## 2024-04-20 LAB — TROPONIN T, HIGH SENSITIVITY: Troponin T High Sensitivity: 158 ng/L (ref 0–19)

## 2024-04-20 LAB — LIPASE, BLOOD: Lipase: 35 U/L (ref 11–51)

## 2024-04-20 LAB — HEPATITIS B SURFACE ANTIGEN: Hepatitis B Surface Ag: NONREACTIVE

## 2024-04-20 MED ORDER — HEPARIN SODIUM (PORCINE) 1000 UNIT/ML IJ SOLN
INTRAMUSCULAR | Status: AC
  Filled 2024-04-20: qty 5

## 2024-04-20 MED ORDER — MORPHINE SULFATE (PF) 2 MG/ML IV SOLN: 2.0000 mg | INTRAVENOUS | Status: DC | PRN

## 2024-04-20 MED ORDER — HEPARIN SODIUM (PORCINE) 5000 UNIT/ML IJ SOLN
5000.0000 [IU] | Freq: Three times a day (TID) | INTRAMUSCULAR | Status: DC
  Administered 2024-04-20: 5000 [IU] via SUBCUTANEOUS
  Filled 2024-04-20: qty 1

## 2024-04-20 MED ORDER — FUROSEMIDE 10 MG/ML IJ SOLN
40.0000 mg | Freq: Two times a day (BID) | INTRAMUSCULAR | Status: DC
  Administered 2024-04-20 – 2024-04-21 (×2): 40 mg via INTRAVENOUS
  Filled 2024-04-20 (×2): qty 4

## 2024-04-20 MED ORDER — CHLORHEXIDINE GLUCONATE CLOTH 2 % EX PADS
6.0000 | MEDICATED_PAD | Freq: Every day | CUTANEOUS | Status: DC
  Filled 2024-04-20 (×2): qty 6

## 2024-04-20 MED ORDER — HEPARIN (PORCINE) 25000 UT/250ML-% IV SOLN
750.0000 [IU]/h | INTRAVENOUS | Status: DC
  Administered 2024-04-20: 750 [IU]/h via INTRAVENOUS
  Filled 2024-04-20: qty 250

## 2024-04-20 MED ORDER — HEPARIN (PORCINE) 25000 UT/250ML-% IV SOLN
550.0000 [IU]/h | INTRAVENOUS | Status: DC
  Administered 2024-04-21: 550 [IU]/h via INTRAVENOUS

## 2024-04-20 MED ORDER — HYDRALAZINE HCL 50 MG PO TABS
50.0000 mg | ORAL_TABLET | Freq: Three times a day (TID) | ORAL | Status: DC
  Administered 2024-04-20 – 2024-04-21 (×2): 50 mg via ORAL
  Filled 2024-04-20 (×2): qty 1

## 2024-04-20 MED ORDER — OXYCODONE HCL 5 MG PO TABS: 5.0000 mg | ORAL_TABLET | ORAL | Status: DC | PRN

## 2024-04-20 MED ORDER — APIXABAN 2.5 MG PO TABS: 2.5000 mg | ORAL_TABLET | Freq: Two times a day (BID) | ORAL | Status: DC

## 2024-04-20 MED ORDER — CARVEDILOL 6.25 MG PO TABS
6.2500 mg | ORAL_TABLET | Freq: Two times a day (BID) | ORAL | Status: DC
  Administered 2024-04-21: 6.25 mg via ORAL
  Filled 2024-04-20: qty 1

## 2024-04-20 MED ORDER — PANTOPRAZOLE SODIUM 40 MG PO TBEC
40.0000 mg | DELAYED_RELEASE_TABLET | Freq: Every day | ORAL | Status: DC
  Administered 2024-04-20 – 2024-04-21 (×2): 40 mg via ORAL
  Filled 2024-04-20 (×2): qty 1

## 2024-04-20 MED ORDER — PERFLUTREN LIPID MICROSPHERE
1.0000 mL | INTRAVENOUS | Status: AC | PRN
  Administered 2024-04-20: 2 mL via INTRAVENOUS

## 2024-04-20 MED ORDER — PROMETHAZINE HCL 25 MG PO TABS
12.5000 mg | ORAL_TABLET | Freq: Four times a day (QID) | ORAL | Status: DC | PRN
  Administered 2024-04-20: 12.5 mg via ORAL
  Filled 2024-04-20: qty 1

## 2024-04-20 MED ORDER — FUROSEMIDE 10 MG/ML IJ SOLN
40.0000 mg | Freq: Once | INTRAMUSCULAR | Status: AC
  Administered 2024-04-20: 40 mg via INTRAVENOUS
  Filled 2024-04-20: qty 4

## 2024-04-20 MED ORDER — DEXTROSE 50 % IV SOLN
1.0000 | Freq: Once | INTRAVENOUS | Status: AC
  Administered 2024-04-20: 50 mL via INTRAVENOUS
  Filled 2024-04-20: qty 50

## 2024-04-20 MED ORDER — LORAZEPAM 2 MG/ML IJ SOLN
0.5000 mg | Freq: Once | INTRAMUSCULAR | Status: AC
  Administered 2024-04-20: 0.5 mg via INTRAVENOUS
  Filled 2024-04-20: qty 1

## 2024-04-20 MED ORDER — ACETAMINOPHEN 325 MG RE SUPP: 650.0000 mg | Freq: Four times a day (QID) | RECTAL | Status: DC | PRN

## 2024-04-20 MED ORDER — SODIUM ZIRCONIUM CYCLOSILICATE 10 G PO PACK
10.0000 g | PACK | ORAL | Status: AC
  Administered 2024-04-20: 10 g via ORAL
  Filled 2024-04-20: qty 1

## 2024-04-20 MED ORDER — INSULIN GLARGINE-YFGN 100 UNIT/ML ~~LOC~~ SOLN
20.0000 [IU] | Freq: Every day | SUBCUTANEOUS | Status: DC
  Filled 2024-04-20: qty 0.2

## 2024-04-20 MED ORDER — ACETAMINOPHEN 325 MG PO TABS: 650.0000 mg | ORAL_TABLET | Freq: Four times a day (QID) | ORAL | Status: DC | PRN

## 2024-04-20 MED ORDER — POLYETHYLENE GLYCOL 3350 17 G PO PACK: 17.0000 g | PACK | Freq: Every day | ORAL | Status: DC | PRN

## 2024-04-20 NOTE — Progress Notes (Signed)
 Pt receives outpt HD at [facility] on MWF at 6:30am.  Navigator following to assist with any HD needs.  Suzen Satchel Dialysis Navigator [phone]

## 2024-04-20 NOTE — ED Notes (Signed)
 Pt provided w/ meal of turkey sandwich, chips, apple sauce, boost nutritional drink and graham crackers @ 2210. Returned at this time to see pt had difficulty eating meal. Pt assisted to eat meal.

## 2024-04-20 NOTE — ED Notes (Signed)
 Called to room.  Breakfast is at bedside.  She says she tried to eat a tiny bite and is now very nauseated.  Has vomit bag.

## 2024-04-20 NOTE — ED Notes (Signed)
 Attempted to take oral and axillary temperature at this time w/o success

## 2024-04-20 NOTE — Progress Notes (Signed)
 Writer made aware of patient having a BG of 43mg /dl. Dr. Marcelino made aware. New order given; Give Dextrose  50% 50ml 1ampule IV once. Will give medication as ordered and continue to monitor.

## 2024-04-20 NOTE — Progress Notes (Signed)
 PHARMACY - ANTICOAGULATION CONSULT NOTE  Pharmacy Consult for Heparin  Infusion Indication: atrial fibrillation  Allergies[1]  Patient Measurements: Height: 5' 1 (154.9 cm) Weight: 56.2 kg (124 lb) IBW/kg (Calculated) : 47.8 HEPARIN  DW (KG): 56.2  Vital Signs: Temp: 97.4 F (36.3 C) (01/28 0942) Temp Source: Oral (01/28 0942) BP: 111/96 (01/28 1130) Pulse Rate: 102 (01/28 1130)  Labs: Recent Labs    04/19/24 2224  HGB 11.0*  HCT 33.6*  PLT 227  LABPROT 20.4*  INR 1.6*  CREATININE 3.05*    Estimated Creatinine Clearance: 12 mL/min (A) (by C-G formula based on SCr of 3.05 mg/dL (H)).   Medical History: Past Medical History:  Diagnosis Date   Allergic genetic state    Atrial fibrillation (HCC) 2010   Cardiomyopathy (HCC)    CHF (congestive heart failure) (HCC)    Diabetes mellitus without complication (HCC)    Dysrhythmia    Gout    H/O cardiac catheterization    Hypertension    Joint pain    Osteopenia    Presence of permanent cardiac pacemaker    Psoriasis     Medications:  Apixaban  2.5 mg PO twice daily- last dose 1/27 @ 1800 Heparin  SQ Q8H- last dose 1/28 @ 0944  Assessment: Deborah Dawson is a pleasant 74 y.o. 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 Pharmacy consulted to initiate patient on heparin  infusion.   Baseline labs ordered. Hgb 11. PLT 227. No signs/symptoms of bleeding noted.  Goal of Therapy:  Heparin  level 0.3-0.7 units/ml aPTT 66-102 seconds Monitor platelets by anticoagulation protocol: Yes    Plan:  - Will not bolus given recent apixaban  and SQH administration - Start heparin  infusion at a rate of 750 units/hr - Monitor via aPTT levels until HL correlating - Check aPTT in 8 hours, HL daily  - Monitor CBC daily while on heparin   Lum VEAR Mania, PharmD, BCPS 04/20/2024,11:48 AM      [1]  Allergies Allergen Reactions   Zofran  [Ondansetron ]     Reports caused or may have caused cardiac arrest   Doxazosin Other (See Comments)    Cardura - cough   Doxycycline      Abdominal pain   Drug Class [Clindamycin/Lincomycin] Hives   Hydralazine  Hcl Other (See Comments)    gout   Metformin  And Related Swelling   Ciprofloxacin Other (See Comments)    Numbness in face   Iodine Other (See Comments)    NOT CT CONTRAST PER PT   Pioglitazone Other (See Comments)   Sacubitril-Valsartan Cough    Pt states that it gave her a cough and chest palpitations   Semaglutide Nausea Only   Sulfamethoxazole-Trimethoprim Hives   Augmentin [Amoxicillin-Pot Clavulanate] Diarrhea   Dulaglutide Nausea Only   Hctz [Hydrochlorothiazide] Other (See Comments)    Gout   Losartan Diarrhea   Poison Oak Extract Rash   Red Dye #40 (Allura Red) Rash

## 2024-04-20 NOTE — H&P (Signed)
 "  History and Physical    Deborah Dawson FMW:[id number] DOB: 02-20-1951 DOA: 04/19/2024  DOS: the patient was seen and examined on 04/19/2024  PCP: Deborah Manna, MD   Patient coming from: Home  I have personally briefly reviewed patient's old medical records in [facility]  Chief Complaint: Vomiting/fall/presyncope  HPI: Deborah Dawson is a pleasant 74 y.o. female with medical history significant for ESRD on hemodialysis Monday Wednesday Friday, atrial fibrillation, cardiomyopathy/CHF, DM, gout, HTN who was recently admitted and discharged from [facility] between 02/25/2024 and 04/12/2024 after being treated for similar problems including fluid overload, pleural effusion, new PE, brought into [facility] ED for evaluation of a bilious vomiting began yesterday morning and she had a presyncopal episode leading to fall and hit head on the floor.  Patient had a last dialysis 04/16/2024.  Patient stated to me that she has been sick for the last couple months.  She was discharged from the hospital 3 days ago and yesterday she had yellowish vomiting.  She was feeling weak and she was about to pass out at home but she fell and hit her head.  She feels like she might have fluid buildup again.  She stated that she had thoracocentesis 8 times at [facility].  Otherwise, she denies fever, chills, nausea, vomiting, abdominal pain, dysuria, hematuria.  She is always short of breath for the last 2 months.  ED Course: Upon arrival to the ED, patient is found to be tachycardic at 108 bpm, afebrile, tachypneic at 21, sodium of 132, potassium 5.7, BUN 37, creatinine 3.05, INR 1.6, BNP of more than 35,000, troponin 162 and 158, EKG showed sinus tachycardia, chest x-ray showed moderate right-sided pleural effusion, CT abdomen showed large right-sided pleural effusion.  Due to ongoing shortness of breath, large right-sided pleural effusion, hyperkalemia requiring dialysis and generalized weakness, hospitalist service was consulted  for evaluation for admission.  Review of Systems:  ROS  All other systems negative except as noted in the HPI.  Past Medical History:  Diagnosis Date   Allergic genetic state    Atrial fibrillation (HCC) 2010   Cardiomyopathy (HCC)    CHF (congestive heart failure) (HCC)    Diabetes mellitus without complication (HCC)    Dysrhythmia    Gout    H/O cardiac catheterization    Hypertension    Joint pain    Osteopenia    Presence of permanent cardiac pacemaker    Psoriasis     Past Surgical History:  Procedure Laterality Date   ABDOMINAL HYSTERECTOMY  1985   BREAST EXCISIONAL BIOPSY Left    negative years ago   BREAST SURGERY Left 1997   papilloma   CARDIAC CATHETERIZATION Left 08/01/2015   Procedure: Left Heart Cath and Coronary Angiography;  Surgeon: Vinie DELENA Jude, MD;  Location: [facility] INVASIVE CV LAB;  Service: Cardiovascular;  Laterality: Left;   CHOLECYSTECTOMY  1986   COLONOSCOPY WITH PROPOFOL  N/A 08/26/2017   Procedure: COLONOSCOPY WITH PROPOFOL ;  Surgeon: Toledo, Ladell POUR, MD;  Location: [facility] ENDOSCOPY;  Service: Gastroenterology;  Laterality: N/A;   DIALYSIS/PERMA CATHETER INSERTION N/A 02/24/2024   Procedure: DIALYSIS/PERMA CATHETER INSERTION;  Surgeon: Marea Selinda RAMAN, MD;  Location: [facility] INVASIVE CV LAB;  Service: Cardiovascular;  Laterality: N/A;   ESOPHAGOGASTRODUODENOSCOPY (EGD) WITH PROPOFOL  N/A 08/26/2017   Procedure: ESOPHAGOGASTRODUODENOSCOPY (EGD) WITH PROPOFOL ;  Surgeon: Toledo, Ladell POUR, MD;  Location: [facility] ENDOSCOPY;  Service: Gastroenterology;  Laterality: N/A;   ESOPHAGOGASTRODUODENOSCOPY (EGD) WITH PROPOFOL  N/A 07/04/2019   Procedure: ESOPHAGOGASTRODUODENOSCOPY (  EGD) WITH PROPOFOL ;  Surgeon: Toledo, Ladell POUR, MD;  Location: [facility] ENDOSCOPY;  Service: Gastroenterology;  Laterality: N/A;   ESOPHAGOGASTRODUODENOSCOPY (EGD) WITH PROPOFOL  N/A 06/12/2023   Procedure: ESOPHAGOGASTRODUODENOSCOPY (EGD) WITH PROPOFOL ;  Surgeon: Maryruth Ole DASEN, MD;  Location: [facility]  ENDOSCOPY;  Service: Endoscopy;  Laterality: N/A;  DM   TUBAL LIGATION  1981     reports that she has never smoked. She has never used smokeless tobacco. She reports that she does not drink alcohol and does not use drugs.  Allergies[1]  Family History  Problem Relation Age of Onset   Cancer Mother 35       breast   Diabetes Mother    Breast cancer Mother 53   Coronary artery disease Mother    Stroke Father    Coronary artery disease Father    Diabetes Son     Prior to Admission medications  Medication Sig Start Date End Date Taking? Authorizing Provider  apixaban  (ELIQUIS ) 2.5 MG TABS tablet Take 2.5 mg by mouth 2 (two) times daily. 04/11/24  Yes [provider]  hydrALAZINE  (APRESOLINE ) 50 MG tablet Take 50 mg by mouth every 8 (eight) hours.   Yes [provider]  insulin  glargine (LANTUS ) 100 UNIT/ML injection Inject 20 Units into the skin every evening.   Yes [provider]  Vitamin D, Ergocalciferol, 2000 units CAPS Take 1 capsule by mouth daily.   Yes [provider]  acetaminophen  (TYLENOL ) 500 MG tablet Take 2 tablets (1,000 mg total) by mouth 3 (three) times daily as needed for mild pain (pain score 1-3), moderate pain (pain score 4-6) or headache. Patient not taking: Reported on 04/20/2024 02/24/24   Al-Sultani, Anmar, MD  ascorbic acid  (VITAMIN C ) 500 MG tablet Take 1 tablet (500 mg total) by mouth 2 (two) times daily. Patient not taking: Reported on 04/20/2024 02/24/24   Al-Sultani, Anmar, MD  benzonatate  (TESSALON ) 100 MG capsule Take 1 capsule (100 mg total) by mouth 3 (three) times daily as needed for cough. Patient not taking: Reported on 04/20/2024 02/24/24   Al-Sultani, Anmar, MD  carvedilol  (COREG ) 6.25 MG tablet Take 1 tablet (6.25 mg total) by mouth 2 (two) times daily with a meal. Patient not taking: Reported on 04/20/2024 02/25/24   Al-Sultani, Anmar, MD  Chlorhexidine  Gluconate Cloth 2 % PADS Apply 6 each topically daily at 6 (six)  AM. Patient not taking: Reported on 04/20/2024 02/24/24   Al-Sultani, Anmar, MD  furosemide  (LASIX ) 10 MG/ML injection Inject 8 mLs (80 mg total) into the vein every 12 (twelve) hours. Patient not taking: Reported on 04/20/2024 02/24/24   Al-Sultani, Anmar, MD  gabapentin  (NEURONTIN ) 100 MG capsule Take 100 mg by mouth 2 (two) times daily. Patient not taking: Reported on 04/20/2024 09/28/23   [provider]  hydrALAZINE  (APRESOLINE ) 20 MG/ML injection Inject 0.5 mLs (10 mg total) into the vein every 4 (four) hours as needed (SBP > 165). Patient not taking: Reported on 04/20/2024 02/24/24   Al-Sultani, Anmar, MD  HYDROcodone  bit-homatropine (HYCODAN) 5-1.5 MG/5ML syrup Take 5 mLs by mouth every 6 (six) hours as needed (when Tessalon  doesn't work). Patient not taking: Reported on 04/20/2024 02/24/24   Al-Sultani, Anmar, MD  hydrocortisone  cream 1 % Apply topically 3 (three) times daily as needed for itching. Patient not taking: Reported on 04/20/2024 02/24/24   Al-Sultani, Anmar, MD  insulin  aspart (NOVOLOG ) 100 UNIT/ML injection Inject 0-6 Units into the skin 3 (three) times daily with meals. Patient not taking: Reported on 04/20/2024 02/25/24  Al-Sultani, Anmar, MD  ipratropium-albuterol  (DUONEB) 0.5-2.5 (3) MG/3ML SOLN Take 3 mLs by nebulization every 6 (six) hours as needed. Patient not taking: Reported on 04/20/2024 02/24/24   Al-Sultani, Anmar, MD  [Paused] isosorbide  mononitrate (IMDUR ) 30 MG 24 hr tablet Take 1 tablet (30 mg total) by mouth daily. Patient not taking: Reported on 04/20/2024 Wait to take this until your doctor or other care provider tells you to start again. 01/30/24   Josette Ade, MD  lidocaine  (LIDODERM ) 5 % Place 1 patch onto the skin daily. Remove & Discard patch within 12 hours or as directed by MD Patient not taking: Reported on 04/20/2024 02/25/24   Al-Sultani, Anmar, MD  menthol  (CEPACOL) 3 MG lozenge Take 1 lozenge (3 mg total) by mouth as needed for sore  throat. Patient not taking: Reported on 04/20/2024 02/24/24   Al-Sultani, Anmar, MD  metoCLOPramide  (REGLAN ) 5 MG/ML injection Inject 1 mL (5 mg total) into the vein every 8 (eight) hours as needed. Patient not taking: Reported on 04/20/2024 02/24/24   Al-Sultani, Anmar, MD  multivitamin (RENA-VIT) TABS tablet Take 1 tablet by mouth at bedtime. Patient not taking: Reported on 04/20/2024 02/24/24   Al-Sultani, Anmar, MD  Nutritional Supplements (FEEDING SUPPLEMENT, NEPRO CARB STEADY,) LIQD Take 237 mLs by mouth 2 (two) times daily between meals. 02/25/24   Al-Sultani, Anmar, MD  oxyCODONE  (OXY IR/ROXICODONE ) 5 MG immediate release tablet Take 1 tablet (5 mg total) by mouth every 6 (six) hours as needed for severe pain (pain score 7-10). Patient not taking: Reported on 04/20/2024 02/24/24   Al-Sultani, Anmar, MD  pantoprazole  (PROTONIX ) 40 MG tablet Take 1 tablet (40 mg total) by mouth daily. Patient not taking: Reported on 04/20/2024 02/25/24   Al-Sultani, Anmar, MD  polyethylene glycol (MIRALAX  / GLYCOLAX ) 17 g packet Take 17 g by mouth daily as needed for moderate constipation. Patient not taking: Reported on 04/20/2024 02/24/24   Al-Sultani, Anmar, MD  prochlorperazine  (COMPAZINE ) 10 MG/2ML injection Inject 1 mL (5 mg total) into the vein every 4 (four) hours as needed. Patient not taking: Reported on 04/20/2024 02/24/24   Al-Sultani, Anmar, MD  promethazine  (PHENERGAN ) 25 MG tablet Take 1 tablet (25 mg total) by mouth every 6 (six) hours as needed for nausea or vomiting. Patient not taking: Reported on 04/20/2024 02/24/24   Al-Sultani, Anmar, MD  [Paused] RIVAROXABAN  (XARELTO ) VTE STARTER PACK (15 & 20 MG) Follow package directions: Take one 15mg  tablet by mouth twice a day. On day 22, switch to one 20mg  tablet once a day. Take with food. Patient not taking: Reported on 04/20/2024 Wait to take this until your doctor or other care provider tells you to start again. 01/30/24   Josette Ade, MD  sodium chloride   (OCEAN) 0.65 % SOLN nasal spray Place 1 spray into both nostrils as needed for congestion. Patient not taking: Reported on 04/20/2024 02/24/24   Al-Sultani, Anmar, MD  Right-sided pleural spironolactone  (ALDACTONE ) 25 MG tablet Take 1/2 tablet (12.5 mg total) by mouth daily. Patient not taking: Reported on 04/20/2024 Wait to take this until your doctor or other care provider tells you to start again. 01/30/24   Josette Ade, MD    Physical Exam: Vitals:   04/20/24 1000 04/20/24 1030 04/20/24 1100 04/20/24 1130  BP: (!) 149/128 121/86 (!) 125/99 (!) 111/96  Pulse: (!) 101 (!) 107 (!) 107 (!) 102  Resp: 20 (!) 23 20 (!) 23  Temp:      TempSrc:      SpO2:  Weight:      Height:        Physical Exam   Constitutional: Alert, awake, calm, comfortable HEENT: Neck supple Respiratory: Bilateral decreased air entry at the bases mostly on the right base Cardiovascular: Regular rate and rhythm, no murmurs / rubs / gallops.  Bilateral 2+ pitting edema. 2+ pedal pulses. No carotid bruits.  Right chest wall has permacath Abdomen: Soft, no tenderness, Bowel sounds positive.  Musculoskeletal: no clubbing / cyanosis. Good ROM, no contractures. Normal muscle tone.  Skin: no rashes, lesions, ulcers. Neurologic: CN 2-12 grossly intact. Sensation intact, No focal deficit identified Psychiatric: Alert and oriented x 3. Normal mood.    Labs on Admission: I have personally reviewed following labs and imaging studies  CBC: Recent Labs  Lab 04/19/24 2224  WBC 8.3  HGB 11.0*  HCT 33.6*  MCV 91.8  PLT 227   Basic Metabolic Panel: Recent Labs  Lab 04/19/24 2224  NA 132*  K 5.7*  CL 93*  CO2 23  GLUCOSE 131*  BUN 37*  CREATININE 3.05*  CALCIUM  9.3   GFR: Estimated Creatinine Clearance: 12 mL/min (A) (by C-G formula based on SCr of 3.05 mg/dL (H)). Liver Function Tests: Recent Labs  Lab 04/20/24 0019  AST 33  ALT 30  ALKPHOS 79  BILITOT 0.7  PROT 7.1  ALBUMIN 3.6   Recent  Labs  Lab 04/20/24 0019  LIPASE 35   No results for input(s): AMMONIA in the last 168 hours. Coagulation Profile: Recent Labs  Lab 04/19/24 2224  INR 1.6*   Cardiac Enzymes: No results for input(s): CKTOTAL, CKMB, CKMBINDEX, TROPONINI, TROPONINIHS in the last 168 hours. BNP (last 3 results) Recent Labs    01/24/24 0800  BNP 2,499.0*   HbA1C: No results for input(s): HGBA1C in the last 72 hours. CBG: No results for input(s): GLUCAP in the last 168 hours. Lipid Profile: No results for input(s): CHOL, HDL, LDLCALC, TRIG, CHOLHDL, LDLDIRECT in the last 72 hours. Thyroid Function Tests: No results for input(s): TSH, T4TOTAL, FREET4, T3FREE, THYROIDAB in the last 72 hours. Anemia Panel: No results for input(s): VITAMINB12, FOLATE, FERRITIN, TIBC, IRON, RETICCTPCT in the last 72 hours. Urine analysis:    Component Value Date/Time   COLORURINE YELLOW (A) 02/10/2024 0149   APPEARANCEUR HAZY (A) 02/10/2024 0149   APPEARANCEUR Hazy (A) 04/19/2020 1411   LABSPEC 1.026 02/10/2024 0149   PHURINE 5.0 02/10/2024 0149   GLUCOSEU >=500 (A) 02/10/2024 0149   HGBUR SMALL (A) 02/10/2024 0149   BILIRUBINUR NEGATIVE 02/10/2024 0149   BILIRUBINUR Negative 04/19/2020 1411   KETONESUR NEGATIVE 02/10/2024 0149   PROTEINUR >=300 (A) 02/10/2024 0149   NITRITE NEGATIVE 02/10/2024 0149   LEUKOCYTESUR NEGATIVE 02/10/2024 0149    Radiological Exams on Admission: I have personally reviewed images CT ABDOMEN PELVIS WO CONTRAST Result Date: 04/20/2024 EXAM: CT ABDOMEN AND PELVIS WITHOUT CONTRAST 04/20/2024 12:57:06 AM TECHNIQUE: CT of the abdomen and pelvis was performed without the administration of intravenous contrast. Multiplanar reformatted images are provided for review. Automated exposure control, iterative reconstruction, and/or weight-based adjustment of the mA/kV was utilized to reduce the radiation dose to as low as reasonably achievable.  COMPARISON: 04/18/2020 CLINICAL HISTORY: Abdominal pain and bilious vomiting. FINDINGS: LOWER CHEST: Mild left basilar atelectasis is seen. Large right-sided pleural effusion is noted with right lower lobe consolidation. LIVER: The liver is unremarkable. GALLBLADDER AND BILE DUCTS: The gallbladder has been surgically removed. No biliary ductal dilatation. SPLEEN: No acute abnormality. PANCREAS: No acute abnormality. ADRENAL GLANDS:  No acute abnormality. KIDNEYS, URETERS AND BLADDER: No stones in the kidneys or ureters. No hydronephrosis. No perinephric or periureteral stranding. The bladder is partially distended. Air is noted within the bladder, which may be related to recent instrumentation. . No findings to suggest fistulization are seen. GI AND BOWEL: Stomach demonstrates no acute abnormality. Small bowel is within normal limits. No obstructive or inflammatory changes of the colon are seen. Scattered diverticular changes noted without diverticulitis. The appendix is within normal limits. There is no bowel obstruction. PERITONEUM AND RETROPERITONEUM: No free fluid is noted. No free air. VASCULATURE: Aortic calcifications are noted. LYMPH NODES: No lymphadenopathy. REPRODUCTIVE ORGANS: The uterus has been surgically removed. BONES AND SOFT TISSUES: No acute osseous abnormality. No focal soft tissue abnormality. IMPRESSION: 1. No evidence of bowel obstruction. 2. Large right pleural effusion with right lower lobe consolidation. Electronically signed by: Oneil Devonshire MD 04/20/2024 01:04 AM EST RP Workstation: [id number]   DG Chest 2 View Result Date: 04/19/2024 CLINICAL DATA:  Near syncope EXAM: CHEST - 2 VIEW COMPARISON:  02/23/2024 FINDINGS: Left-sided pacing device similar in position. Right-sided central venous catheter has been placed, tips overlie the SVC and proximal right atrium. Hypoventilatory changes. Suspected cardiomegaly. Moderate large right pleural effusion with airspace disease at the right base.  No pneumothorax IMPRESSION: 1. Moderate to large right pleural effusion with airspace disease at the right base. 2. Cardiomegaly. Electronically Signed   By: Luke Bun M.D.   On: 04/19/2024 22:51    EKG: My personal interpretation of EKG shows: A-fib with RVR    Assessment/Plan Principal Problem:   Fluid overload Active Problems:   Acute on chronic systolic CHF (congestive heart failure) (HCC)   Recurrent right pleural effusion   Paroxysmal atrial fibrillation (HCC)   Hyponatremia   Hyperkalemia   Elevated troponin   History of pulmonary embolism   History of cardiac arrest   ESRD on dialysis (HCC)   DM (diabetes mellitus) (HCC)    Assessment and Plan: 74 year old female with complex medical history including but not limited to history of recent cardiac arrest, recurrent right-sided pleural effusion requiring multiple thoracentesis, HTN, HLD, DM, paroxysmal atrial fibrillation, cardiomyopathy/CHF, ESRD on recent hemodialysis, chronic hypokalemia, pulmonary embolism who was recently admitted and discharged from [facility] between 02/25/2024 and 04/12/2024, came in for a fall, shortness of breath and fluid overload.  1.  Fall/presyncope, generalized weakness - Patient has multiple reasons to have generalized weakness and fall. - It appears that this could be mechanical in nature versus orthostatic. - She will be admitted to hospital as inpatient, monitoring telemetry. - PT/OT.  2.  Acute hypoxemic respiratory failure likely due to fluid overload/acute congestive heart failure exacerbation. - She was given IV Lasix  in the emergency room. - She will be continued on Lasix  40 mg twice daily. - Input output, daily weight. - Continue Coreg , Lasix , hydralazine  - She does not use oxygen at home - Continue oxygen to maintain saturation more than 90%. - Wean oxygen as much as he can.  3.  Chronic paroxysmal atrial fibrillation. - Heart rate has been stable. - Hold Eliquis  in light  of requiring thoracentesis per pulmonary - Continue Coreg .  4.  Recurrent right-sided pleural effusion - Pulmonary consult has been requested. - May need thoracentesis and possible chest tube placement. - Will defer to pulmonology.  5.  Type 2 diabetes - Continue on Lantus  as well as sliding scale - Continue to monitor blood glucose  6.  Hyperkalemia - Patient was given  Lokelma  in the ED - Patient is going for dialysis. - Continue to monitor potassium level  7.  ESRD on hemodialysis - Nephrology has been consulted  8.  HTN - Resume home home medications including Coreg , hydralazine , - Continue to monitor blood pressure  9.  Chronic troponin leak - May be related to congestive heart failure exacerbation - There is no chest pain and troponins are similar to her baseline  10.  Hyponatremia - This may be related to fluid overload - This may get better after dialysis - Continue to monitor sodium level       DVT prophylaxis: IV heparin  gtts Code Status: Full Code Family Communication: None, patient is alert awake and oriented and she wanted to be full code.  There was confusion from the previous note that she may be DNR but patient advised me that she wants to be full code. Disposition Plan: Home versus rehab Consults called: Pulmonology Admission status: Inpatient, progressive   Nena Rebel, MD [facility] 04/20/2024, 11:47 AM        [1]  Allergies Allergen Reactions   Zofran  [Ondansetron ]     Reports caused or may have caused cardiac arrest   Doxazosin Other (See Comments)    Cardura - cough   Doxycycline      Abdominal pain   Drug Class [Clindamycin/Lincomycin] Hives   Hydralazine  Hcl Other (See Comments)    gout   Metformin  And Related Swelling   Ciprofloxacin Other (See Comments)    Numbness in face   Iodine Other (See Comments)    NOT CT CONTRAST PER PT   Pioglitazone Other (See Comments)   Sacubitril-Valsartan Cough    Pt states that it  gave her a cough and chest palpitations   Semaglutide Nausea Only   Sulfamethoxazole-Trimethoprim Hives   Augmentin [Amoxicillin-Pot Clavulanate] Diarrhea   Dulaglutide Nausea Only   Hctz [Hydrochlorothiazide] Other (See Comments)    Gout   Losartan Diarrhea   Poison Oak Extract Rash   Red Dye #40 (Allura Red) Rash   "

## 2024-04-20 NOTE — ED Notes (Signed)
 Called lab to get labs on pt.

## 2024-04-20 NOTE — Consult Note (Signed)
 "  NAME:  Deborah Dawson, MRN:  [id number], DOB:  1950/08/12, LOS: 0 ADMISSION DATE:  04/19/2024, CONSULTATION DATE:  04/20/2024 REFERRING MD:  Nena Rebel, MD, CHIEF COMPLAINT:  Respiratory Failure, Pleural Effusion  History of Present Illness:   Patient is a 74 year old female with history of heart failure with reduced ejection fraction and end-stage renal disease on HD presenting to the hospital with respiratory distress secondary to an enlarging recurring right pleural effusion.  Patient reports that she had her last session of dialysis over the weekend.  She gets her dialysis on Mondays, Wednesdays, and Fridays.  She had her regular session of dialysis on Friday of last week with another session performed on Saturday owing to the incoming increment weather.  She has not had dialysis since then.  Over the past 2 days, she has had increased lower extremity edema and swelling with increased shortness of breath.  She reports that her lower extremities were quite thin and dry at the time of her discharge from [facility] a week ago.  She has had some dietary indiscretions with salt.  She felt dizzy and reports falling a few days ago hitting the back of her head without any loss of consciousness or any persistent neurological deficits.  Chart was reviewed at length, patient with multiple recent admissions to the hospital.  She had a brief admission in November with decompensated heart failure, right sided pleural effusion, and pulmonary embolism. This was treated with anti-coagulation, thoracentesis, and diuresis.  She was again admitted with cardiac arrest November 18 as well as decompensated heart failure and recurrent pleural effusion. With worsening renal failure felt secondary to cardiorenal syndrome, she was started on renal replacement therapy and transitioned to HD. She was then transferred to [facility] on 12/4. EF noted at less than 15% on echocardiogram at [facility] in December 2025.  She was admitted to [facility]  starting February 25, 2024 and discharged April 12, 2024.  During this hospitalization, she was managed for cardiogenic shock due to presumed nonischemic cardiomyopathy with iono-dilators (milrinone) and vasopressors. She was dialyzed for volume removal, and underwent multiple thoracentesis. She was evaluated by pulmonary and interventional pulmonary for her effusion, with IPC consideration (pleurX). Felt that with her heart failure and improvement on HD, risk of infection with PleurX was too high.  RHC 12/6: RA 17, RV 58/10, PA 58/23 (39), PCWP 22, CO/CI 2.7/1.7, PVR 6.2 Wood RHC 12/15: RA 21, RV 73/19, PA 73/42 (51), PCWP 35, CO/CI 3.1/1.8, PVR 5.2 Wood  Thoracentesis history of right pleural effusion:  -01/26/24: 900 mL (LDH 79, protein 3)  -01/29/24: 700 mL (LDH not measured, protein < 3)  -02/11/24: 700 mL (LDH 95, protein 3.4, albumin 2)  -02/17/24: 1.2 L   -02/23/24: 1.5L (LDH 87, cholesterol 34)  -03/09/24: 1.1L  -03/28/24: 1.3L  -04/02/24: 1.5L  -04/05/24: 1.5L  Patient presents today, one week after discharge from [facility], with increased shortness of breath and decompensated heart failure. PCCM is consulted for the evaluation of her right pleural effusion.  Pertinent  Medical History  -HFrEF -NICM -Recurrent Right Pleural Effusion -Afib   Objective    Blood pressure (!) 111/96, pulse (!) 102, temperature (!) 97.4 F (36.3 C), temperature source Oral, resp. rate (!) 23, height 5' 1 (1.549 m), weight 56.2 kg, SpO2 99%.       No intake or output data in the 24 hours ending 04/20/24 1143 Filed Weights   04/19/24 2217  Weight: 56.2 kg    Examination: Physical Exam  Constitutional:      Appearance: She is ill-appearing.  Cardiovascular:     Rate and Rhythm: Normal rate. Rhythm irregular.     Pulses: Normal pulses.  Pulmonary:     Effort: Respiratory distress present.     Comments: Decreased air entry over the right lower lung field Musculoskeletal:     Right lower leg:  Edema present.     Left lower leg: Edema present.  Neurological:     Mental Status: She is alert.      Assessment and Plan   #Acute Hypoxic Respiratory Failure #Recurrent Right Pleural Effusion #Acute on Chronic HFrEF  #NICM #ESRD on HD  #Cardiorenal Syndrome  74 year old female with history of advanced heart failure, with recent hospitalizations for decompensated HFrEF with recent prolonged admission to [facility] where she was managed with tailored therapy. She has had a recurrent right sided pleural effusion (s/p 9 thoracentesis over the past two months). She is admitted to the hospital with acute hypoxic respiratory failure secondary to decompensated heart failure and her enlarging right sided pleural effusion.  The effusion was previously sampled and noted to be a transudate in nature secondary to her heart failure. It is large, simple appearing, and free flowing on point of care ultrasound today. Given size and respiratory distress, it is unlikely to resolve with dialysis alone. She will require a procedural intervention, and I would favor a chest tube to fully drain this effusion, rather than repeat thoracentesis (limited to 1.5 liters given risk of re-expansion pulmonary edema). She received her apixaban  yesterday night, and I would favor waiting at least 24 hours prior to proceeding with pleural intervention.  It appears that with good ultrafiltration and volume management, the rate of recurrence of her effusion did slow down while she was hospitalized. If this continues to re-accumulate at this current rate an indwelling pleural catheter (PleurX) could be considered. Risk of bleeding from recurrent thoracentesis to be weighed against risk of infection from the PleurX.  -hold apixaban  -renal for HD -recommend cardiology consult -chest tube placement in AM  Belva November, MD [facility] 04/20/2024 5:08 PM    Labs   CBC: Recent Labs  Lab 04/19/24 2224  WBC  8.3  HGB 11.0*  HCT 33.6*  MCV 91.8  PLT 227    Basic Metabolic Panel: Recent Labs  Lab 04/19/24 2224  NA 132*  K 5.7*  CL 93*  CO2 23  GLUCOSE 131*  BUN 37*  CREATININE 3.05*  CALCIUM  9.3   GFR: Estimated Creatinine Clearance: 12 mL/min (A) (by C-G formula based on SCr of 3.05 mg/dL (H)). Recent Labs  Lab 04/19/24 2224  WBC 8.3    Liver Function Tests: Recent Labs  Lab 04/20/24 0019  AST 33  ALT 30  ALKPHOS 79  BILITOT 0.7  PROT 7.1  ALBUMIN 3.6   Recent Labs  Lab 04/20/24 0019  LIPASE 35   No results for input(s): AMMONIA in the last 168 hours.  ABG    Component Value Date/Time   PHART 7.35 02/16/2024 1121   PCO2ART 35 02/16/2024 1121   PO2ART 78 (L) 02/16/2024 1121   HCO3 19.3 (L) 02/16/2024 1121   ACIDBASEDEF 5.6 (H) 02/16/2024 1121   O2SAT 95.5 02/16/2024 1121     Coagulation Profile: Recent Labs  Lab 04/19/24 2224  INR 1.6*    Cardiac Enzymes: No results for input(s): CKTOTAL, CKMB, CKMBINDEX, TROPONINI in the last 168 hours.  HbA1C: Hgb A1c MFr Bld  Date/Time  Value Ref Range Status  01/24/2024 10:05 AM 8.0 (H) 4.8 - 5.6 % Final    Comment:    (NOTE) Diagnosis of Diabetes The following HbA1c ranges recommended by the American Diabetes Association (ADA) may be used as an aid in the diagnosis of diabetes mellitus.  Hemoglobin             Suggested A1C NGSP%              Diagnosis  <5.7                   Non Diabetic  5.7-6.4                Pre-Diabetic  >6.4                   Diabetic  <7.0                   Glycemic control for                       adults with diabetes.    06/24/2015 04:42 PM 7.9 (H) 4.0 - 6.0 % Final    CBG: No results for input(s): GLUCAP in the last 168 hours.  Review of Systems:   Review of Systems  Constitutional:  Positive for malaise/fatigue. Negative for chills and fever.  Respiratory:  Positive for cough, shortness of breath and wheezing. Negative for hemoptysis and sputum  production.   Cardiovascular:  Negative for chest pain.     Past Medical History:  She,  has a past medical history of Allergic genetic state, Atrial fibrillation (HCC) (2010), Cardiomyopathy (HCC), CHF (congestive heart failure) (HCC), Diabetes mellitus without complication (HCC), Dysrhythmia, Gout, H/O cardiac catheterization, Hypertension, Joint pain, Osteopenia, Presence of permanent cardiac pacemaker, and Psoriasis.   Surgical History:   Past Surgical History:  Procedure Laterality Date   ABDOMINAL HYSTERECTOMY  1985   BREAST EXCISIONAL BIOPSY Left    negative years ago   BREAST SURGERY Left 1997   papilloma   CARDIAC CATHETERIZATION Left 08/01/2015   Procedure: Left Heart Cath and Coronary Angiography;  Surgeon: Vinie DELENA Jude, MD;  Location: [facility] INVASIVE CV LAB;  Service: Cardiovascular;  Laterality: Left;   CHOLECYSTECTOMY  1986   COLONOSCOPY WITH PROPOFOL  N/A 08/26/2017   Procedure: COLONOSCOPY WITH PROPOFOL ;  Surgeon: Toledo, Ladell POUR, MD;  Location: [facility] ENDOSCOPY;  Service: Gastroenterology;  Laterality: N/A;   DIALYSIS/PERMA CATHETER INSERTION N/A 02/24/2024   Procedure: DIALYSIS/PERMA CATHETER INSERTION;  Surgeon: Marea Selinda RAMAN, MD;  Location: [facility] INVASIVE CV LAB;  Service: Cardiovascular;  Laterality: N/A;   ESOPHAGOGASTRODUODENOSCOPY (EGD) WITH PROPOFOL  N/A 08/26/2017   Procedure: ESOPHAGOGASTRODUODENOSCOPY (EGD) WITH PROPOFOL ;  Surgeon: Toledo, Ladell POUR, MD;  Location: [facility] ENDOSCOPY;  Service: Gastroenterology;  Laterality: N/A;   ESOPHAGOGASTRODUODENOSCOPY (EGD) WITH PROPOFOL  N/A 07/04/2019   Procedure: ESOPHAGOGASTRODUODENOSCOPY (EGD) WITH PROPOFOL ;  Surgeon: Toledo, Ladell POUR, MD;  Location: [facility] ENDOSCOPY;  Service: Gastroenterology;  Laterality: N/A;   ESOPHAGOGASTRODUODENOSCOPY (EGD) WITH PROPOFOL  N/A 06/12/2023   Procedure: ESOPHAGOGASTRODUODENOSCOPY (EGD) WITH PROPOFOL ;  Surgeon: Maryruth Ole DASEN, MD;  Location: [facility] ENDOSCOPY;  Service: Endoscopy;  Laterality:  N/A;  DM   TUBAL LIGATION  1981     Social History:   reports that she has never smoked. She has never used smokeless tobacco. She reports that she does not drink alcohol and does not use drugs.   Family History:  Her family history includes Breast cancer (age of onset:  60) in her mother; Cancer (age of onset: 46) in her mother; Coronary artery disease in her father and mother; Diabetes in her mother and son; Stroke in her father.   Allergies Allergies[1]   Home Medications  Prior to Admission medications  Medication Sig Start Date End Date Taking? Authorizing Provider  apixaban  (ELIQUIS ) 2.5 MG TABS tablet Take 2.5 mg by mouth 2 (two) times daily. 04/11/24  Yes [provider]  hydrALAZINE  (APRESOLINE ) 50 MG tablet Take 50 mg by mouth every 8 (eight) hours.   Yes [provider]  insulin  glargine (LANTUS ) 100 UNIT/ML injection Inject 20 Units into the skin every evening.   Yes [provider]  Vitamin D, Ergocalciferol, 2000 units CAPS Take 1 capsule by mouth daily.   Yes [provider]  acetaminophen  (TYLENOL ) 500 MG tablet Take 2 tablets (1,000 mg total) by mouth 3 (three) times daily as needed for mild pain (pain score 1-3), moderate pain (pain score 4-6) or headache. Patient not taking: Reported on 04/20/2024 02/24/24   Al-Sultani, Anmar, MD  ascorbic acid  (VITAMIN C ) 500 MG tablet Take 1 tablet (500 mg total) by mouth 2 (two) times daily. Patient not taking: Reported on 04/20/2024 02/24/24   Al-Sultani, Anmar, MD  benzonatate  (TESSALON ) 100 MG capsule Take 1 capsule (100 mg total) by mouth 3 (three) times daily as needed for cough. Patient not taking: Reported on 04/20/2024 02/24/24   Al-Sultani, Anmar, MD  carvedilol  (COREG ) 6.25 MG tablet Take 1 tablet (6.25 mg total) by mouth 2 (two) times daily with a meal. Patient not taking: Reported on 04/20/2024 02/25/24   Al-Sultani, Anmar, MD  Chlorhexidine  Gluconate Cloth 2 % PADS Apply 6 each topically daily  at 6 (six) AM. Patient not taking: Reported on 04/20/2024 02/24/24   Al-Sultani, Anmar, MD  furosemide  (LASIX ) 10 MG/ML injection Inject 8 mLs (80 mg total) into the vein every 12 (twelve) hours. Patient not taking: Reported on 04/20/2024 02/24/24   Al-Sultani, Anmar, MD  gabapentin  (NEURONTIN ) 100 MG capsule Take 100 mg by mouth 2 (two) times daily. Patient not taking: Reported on 04/20/2024 09/28/23   [provider]  hydrALAZINE  (APRESOLINE ) 20 MG/ML injection Inject 0.5 mLs (10 mg total) into the vein every 4 (four) hours as needed (SBP > 165). Patient not taking: Reported on 04/20/2024 02/24/24   Al-Sultani, Anmar, MD  HYDROcodone  bit-homatropine (HYCODAN) 5-1.5 MG/5ML syrup Take 5 mLs by mouth every 6 (six) hours as needed (when Tessalon  doesn't work). Patient not taking: Reported on 04/20/2024 02/24/24   Al-Sultani, Anmar, MD  hydrocortisone  cream 1 % Apply topically 3 (three) times daily as needed for itching. Patient not taking: Reported on 04/20/2024 02/24/24   Al-Sultani, Anmar, MD  insulin  aspart (NOVOLOG ) 100 UNIT/ML injection Inject 0-6 Units into the skin 3 (three) times daily with meals. Patient not taking: Reported on 04/20/2024 02/25/24   Al-Sultani, Anmar, MD  ipratropium-albuterol  (DUONEB) 0.5-2.5 (3) MG/3ML SOLN Take 3 mLs by nebulization every 6 (six) hours as needed. Patient not taking: Reported on 04/20/2024 02/24/24   Al-Sultani, Anmar, MD  [Paused] isosorbide  mononitrate (IMDUR ) 30 MG 24 hr tablet Take 1 tablet (30 mg total) by mouth daily. Patient not taking: Reported on 04/20/2024 Wait to take this until your doctor or other care provider tells you to start again. 01/30/24   Josette Ade, MD  lidocaine  (LIDODERM ) 5 % Place 1 patch onto the skin daily. Remove & Discard patch within 12 hours or as directed by MD Patient not taking: Reported  on 04/20/2024 02/25/24   Al-Sultani, Anmar, MD  menthol  (CEPACOL) 3 MG lozenge Take 1 lozenge (3 mg total) by mouth as needed for sore  throat. Patient not taking: Reported on 04/20/2024 02/24/24   Al-Sultani, Anmar, MD  metoCLOPramide  (REGLAN ) 5 MG/ML injection Inject 1 mL (5 mg total) into the vein every 8 (eight) hours as needed. Patient not taking: Reported on 04/20/2024 02/24/24   Al-Sultani, Anmar, MD  multivitamin (RENA-VIT) TABS tablet Take 1 tablet by mouth at bedtime. Patient not taking: Reported on 04/20/2024 02/24/24   Al-Sultani, Anmar, MD  Nutritional Supplements (FEEDING SUPPLEMENT, NEPRO CARB STEADY,) LIQD Take 237 mLs by mouth 2 (two) times daily between meals. 02/25/24   Al-Sultani, Anmar, MD  oxyCODONE  (OXY IR/ROXICODONE ) 5 MG immediate release tablet Take 1 tablet (5 mg total) by mouth every 6 (six) hours as needed for severe pain (pain score 7-10). Patient not taking: Reported on 04/20/2024 02/24/24   Al-Sultani, Anmar, MD  pantoprazole  (PROTONIX ) 40 MG tablet Take 1 tablet (40 mg total) by mouth daily. Patient not taking: Reported on 04/20/2024 02/25/24   Al-Sultani, Anmar, MD  polyethylene glycol (MIRALAX  / GLYCOLAX ) 17 g packet Take 17 g by mouth daily as needed for moderate constipation. Patient not taking: Reported on 04/20/2024 02/24/24   Al-Sultani, Anmar, MD  prochlorperazine  (COMPAZINE ) 10 MG/2ML injection Inject 1 mL (5 mg total) into the vein every 4 (four) hours as needed. Patient not taking: Reported on 04/20/2024 02/24/24   Al-Sultani, Anmar, MD  promethazine  (PHENERGAN ) 25 MG tablet Take 1 tablet (25 mg total) by mouth every 6 (six) hours as needed for nausea or vomiting. Patient not taking: Reported on 04/20/2024 02/24/24   Al-Sultani, Anmar, MD  [Paused] RIVAROXABAN  (XARELTO ) VTE STARTER PACK (15 & 20 MG) Follow package directions: Take one 15mg  tablet by mouth twice a day. On day 22, switch to one 20mg  tablet once a day. Take with food. Patient not taking: Reported on 04/20/2024 Wait to take this until your doctor or other care provider tells you to start again. 01/30/24   Josette Ade, MD  sodium chloride   (OCEAN) 0.65 % SOLN nasal spray Place 1 spray into both nostrils as needed for congestion. Patient not taking: Reported on 04/20/2024 02/24/24   Al-Sultani, Anmar, MD  [Paused] spironolactone  (ALDACTONE ) 25 MG tablet Take 1/2 tablet (12.5 mg total) by mouth daily. Patient not taking: Reported on 04/20/2024 Wait to take this until your doctor or other care provider tells you to start again. 01/30/24   Josette Ade, MD      I spent 90 minutes caring for this patient today, including preparing to see the patient, obtaining a medical history , reviewing a separately obtained history, performing a medically appropriate examination and/or evaluation, counseling and educating the patient/family/caregiver, ordering medications, tests, or procedures, referring and communicating with other health care professionals (not separately reported), documenting clinical information in the electronic health record, independently interpreting results (not separately reported/billed) and communicating results to the patient/family/caregiver, and care coordination (not separately reported/billed)              [1]  Allergies Allergen Reactions   Zofran  [Ondansetron ]     Reports caused or may have caused cardiac arrest   Doxazosin Other (See Comments)    Cardura - cough   Doxycycline      Abdominal pain   Drug Class [Clindamycin/Lincomycin] Hives   Hydralazine  Hcl Other (See Comments)    gout   Metformin  And Related Swelling   Ciprofloxacin Other (See  Comments)    Numbness in face   Iodine Other (See Comments)    NOT CT CONTRAST PER PT   Pioglitazone Other (See Comments)   Sacubitril-Valsartan Cough    Pt states that it gave her a cough and chest palpitations   Semaglutide Nausea Only   Sulfamethoxazole-Trimethoprim Hives   Augmentin [Amoxicillin-Pot Clavulanate] Diarrhea   Dulaglutide Nausea Only   Hctz [Hydrochlorothiazide] Other (See Comments)    Gout   Losartan Diarrhea   Poison Oak Extract  Rash   Red Dye #40 (Allura Red) Rash   "

## 2024-04-20 NOTE — Progress Notes (Signed)
 " [facility]  ROUNDING NOTE   Subjective:   Deborah Dawson is a 74 y.o. 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 Patient presents to ED with vomiting, syncope and fall. She is admitted for Fluid overload [E87.70]  Patient is known to our practice and receives outpatient dialysis treatments at [facility] on a MWF schedule.  Patient received dialysis on Saturday.  She reports she feels weak since discharge from previous admission.  She reports falling at home and possibly hitting her head.  She reports yellowish vomiting as well.  Labs on ED arrival concerning for sodium 132, potassium 5.7, BUN 37, creatinine 3.05 with GFR 15, troponin 162, and hemoglobin 11.0.  Chest x-ray shows moderate to large right pleural effusion with cardiomegaly.  CT abdomen pelvis shows large right pleural effusion with right lower lobe consolidation.  We have been consulted to manage dialysis needs.   Objective:  Vital signs in last 24 hours:  Temp:  [97.2 F (36.2 C)-97.5 F (36.4 C)] 97.5 F (36.4 C) (01/28 1433) Pulse Rate:  [97-110] 101 (01/28 1446) Resp:  [13-31] 22 (01/28 1446) BP: (108-149)/(76-128) 133/86 (01/28 1433) SpO2:  [94 %-99 %] 96 % (01/28 1433) Weight:  [56.2 kg] 56.2 kg (01/27 2217)  Weight change:  Filed Weights   04/19/24 2217  Weight: 56.2 kg    Intake/Output: No intake/output data recorded.   Intake/Output this shift:  No intake/output data recorded.  Physical Exam: General: NAD  Head: Normocephalic, atraumatic. Moist oral mucosal membranes  Eyes: Anicteric  Lungs:  Diminished  Heart: Regular rate and rhythm  Abdomen:  Soft, nontender  Extremities: 2+ peripheral edema.  Neurologic: Awake, alert, conversant  Skin: Warm,dry, no rash  Access: Right IJ PermCath    Basic Metabolic Panel: Recent Labs  Lab  04/19/24 2224  NA 132*  K 5.7*  CL 93*  CO2 23  GLUCOSE 131*  BUN 37*  CREATININE 3.05*  CALCIUM  9.3    Liver Function Tests: Recent Labs  Lab 04/20/24 0019  AST 33  ALT 30  ALKPHOS 79  BILITOT 0.7  PROT 7.1  ALBUMIN 3.6   Recent Labs  Lab 04/20/24 0019  LIPASE 35   No results for input(s): AMMONIA in the last 168 hours.  CBC: Recent Labs  Lab 04/19/24 2224  WBC 8.3  HGB 11.0*  HCT 33.6*  MCV 91.8  PLT 227    Cardiac Enzymes: No results for input(s): CKTOTAL, CKMB, CKMBINDEX, TROPONINI in the last 168 hours.  BNP: Invalid input(s): POCBNP  CBG: No results for input(s): GLUCAP in the last 168 hours.  Microbiology: Results for orders placed or performed during the hospital encounter of 02/09/24  Blood culture (routine x 2)     Status: None   Collection Time: 02/09/24  9:27 PM   Specimen: BLOOD  Result Value Ref Range Status   Specimen Description BLOOD BLOOD RIGHT ARM  Final   Special Requests   Final    BOTTLES DRAWN AEROBIC AND ANAEROBIC Blood Culture results may not be optimal due to an inadequate volume of blood received in culture bottles   Culture   Final    NO GROWTH 5 DAYS Performed at [facility], [address], [place], [place] [address]    Report Status 02/14/2024 FINAL  Final  Blood culture (routine x 2)  Status: None   Collection Time: 02/09/24  9:27 PM   Specimen: BLOOD  Result Value Ref Range Status   Specimen Description BLOOD BLOOD LEFT ARM  Final   Special Requests   Final    BOTTLES DRAWN AEROBIC AND ANAEROBIC Blood Culture results may not be optimal due to an inadequate volume of blood received in culture bottles   Culture   Final    NO GROWTH 5 DAYS Performed at [facility], [address], [place], [place] [address]    Report Status 02/14/2024 FINAL  Final  MRSA Next Gen by PCR, Nasal     Status: None   Collection Time: 02/10/24  1:49 AM   Specimen: Nasal Mucosa; Nasal Swab   Result Value Ref Range Status   MRSA by PCR Next Gen NOT DETECTED NOT DETECTED Final    Comment: (NOTE) The GeneXpert MRSA Assay (FDA approved for NASAL specimens only), is one component of a comprehensive MRSA colonization surveillance program. It is not intended to diagnose MRSA infection nor to guide or monitor treatment for MRSA infections. Test performance is not FDA approved in patients less than 15 years old. Performed at [facility], [address] [place], [place] [address]   Respiratory (~20 pathogens) panel by PCR     Status: Abnormal   Collection Time: 02/10/24  1:49 AM   Specimen: Nasopharyngeal Swab; Respiratory  Result Value Ref Range Status   Adenovirus NOT DETECTED NOT DETECTED Final   Coronavirus 229E NOT DETECTED NOT DETECTED Final    Comment: (NOTE) The Coronavirus on the Respiratory Panel, DOES NOT test for the novel  Coronavirus (2019 nCoV)    Coronavirus HKU1 NOT DETECTED NOT DETECTED Final   Coronavirus NL63 NOT DETECTED NOT DETECTED Final   Coronavirus OC43 NOT DETECTED NOT DETECTED Final   Metapneumovirus NOT DETECTED NOT DETECTED Final   Rhinovirus / Enterovirus NOT DETECTED NOT DETECTED Final   Influenza A NOT DETECTED NOT DETECTED Final   Influenza B NOT DETECTED NOT DETECTED Final   Parainfluenza Virus 1 NOT DETECTED NOT DETECTED Final   Parainfluenza Virus 2 NOT DETECTED NOT DETECTED Final   Parainfluenza Virus 3 DETECTED (A) NOT DETECTED Final   Parainfluenza Virus 4 NOT DETECTED NOT DETECTED Final   Respiratory Syncytial Virus NOT DETECTED NOT DETECTED Final   Bordetella pertussis NOT DETECTED NOT DETECTED Final   Bordetella Parapertussis NOT DETECTED NOT DETECTED Final   Chlamydophila pneumoniae NOT DETECTED NOT DETECTED Final   Mycoplasma pneumoniae NOT DETECTED NOT DETECTED Final    Comment: Performed at [facility], [address] [place], [place] [address]  Resp panel by RT-PCR (RSV, Flu A&B, Covid) Anterior Nasal Swab      Status: None   Collection Time: 02/10/24  3:17 AM   Specimen: Anterior Nasal Swab  Result Value Ref Range Status   SARS Coronavirus 2 by RT PCR NEGATIVE NEGATIVE Final    Comment: (NOTE) SARS-CoV-2 target nucleic acids are NOT DETECTED.  The SARS-CoV-2 RNA is generally detectable in upper respiratory specimens during the acute phase of infection. The lowest concentration of SARS-CoV-2 viral copies this assay can detect is 138 copies/mL. A negative result does not preclude SARS-Cov-2 infection and should not be used as the sole basis for treatment or other patient management decisions. A negative result may occur with  improper specimen collection/handling, submission of specimen other than nasopharyngeal swab, presence of viral mutation(s) within the areas targeted by this assay, and inadequate number of viral  copies(<138 copies/mL). A negative result must be combined with clinical observations, patient history, and epidemiological information. The expected result is Negative.  Fact Sheet for Patients:  bloggercourse.com  Fact Sheet for Healthcare Providers:  seriousbroker.it  This test is no t yet approved or cleared by the [place]  FDA and  has been authorized for detection and/or diagnosis of SARS-CoV-2 by FDA under an Emergency Use Authorization (EUA). This EUA will remain  in effect (meaning this test can be used) for the duration of the COVID-19 declaration under Section 564(b)(1) of the Act, 21 U.S.C.section 360bbb-3(b)(1), unless the authorization is terminated  or revoked sooner.       Influenza A by PCR NEGATIVE NEGATIVE Final   Influenza B by PCR NEGATIVE NEGATIVE Final    Comment: (NOTE) The Xpert Xpress SARS-CoV-2/FLU/RSV plus assay is intended as an aid in the diagnosis of influenza from Nasopharyngeal swab specimens and should not be used as a sole basis for treatment. Nasal washings and aspirates are  unacceptable for Xpert Xpress SARS-CoV-2/FLU/RSV testing.  Fact Sheet for Patients: bloggercourse.com  Fact Sheet for Healthcare Providers: seriousbroker.it  This test is not yet approved or cleared by the [place]  FDA and has been authorized for detection and/or diagnosis of SARS-CoV-2 by FDA under an Emergency Use Authorization (EUA). This EUA will remain in effect (meaning this test can be used) for the duration of the COVID-19 declaration under Section 564(b)(1) of the Act, 21 U.S.C. section 360bbb-3(b)(1), unless the authorization is terminated or revoked.     Resp Syncytial Virus by PCR NEGATIVE NEGATIVE Final    Comment: (NOTE) Fact Sheet for Patients: bloggercourse.com  Fact Sheet for Healthcare Providers: seriousbroker.it  This test is not yet approved or cleared by the [place]  FDA and has been authorized for detection and/or diagnosis of SARS-CoV-2 by FDA under an Emergency Use Authorization (EUA). This EUA will remain in effect (meaning this test can be used) for the duration of the COVID-19 declaration under Section 564(b)(1) of the Act, 21 U.S.C. section 360bbb-3(b)(1), unless the authorization is terminated or revoked.  Performed at [facility], [address] [place], [place] [address]   Culture, Respiratory w Gram Stain     Status: None   Collection Time: 02/10/24  7:56 AM   Specimen: Tracheal Aspirate; Respiratory  Result Value Ref Range Status   Specimen Description   Final    TRACHEAL ASPIRATE Performed at [facility], [address] [place], [place] [address]    Special Requests   Final    NONE Performed at [facility], [address] [place], [place] [address]    Gram Stain   Final    FEW WBC PRESENT,BOTH PMN AND MONONUCLEAR NO ORGANISMS SEEN    Culture   Final    NO GROWTH 2 DAYS Performed at [facility], [address] [place], [place] [address]    Report Status 02/12/2024 FINAL  Final  Body fluid culture w Gram Stain     Status: None   Collection Time: 02/11/24 12:25 PM   Specimen: Pleura; Body Fluid  Result Value Ref Range Status   Specimen Description   Final    PLEURAL Performed at [facility] [place] [facility], [address], [place], [place] [address]    Special Requests   Final    NONE Performed at [facility], [address] [place], [place] [address]    Gram Stain   Final    WBC PRESENT, PREDOMINANTLY  MONONUCLEAR NO ORGANISMS SEEN    Culture   Final    NO GROWTH 3 DAYS Performed at [facility], [address] [place], [place] [address]    Report Status 02/15/2024 FINAL  Final  Resp panel by RT-PCR (RSV, Flu A&B, Covid) Anterior Nasal Swab     Status: None   Collection Time: 02/17/24  6:19 PM   Specimen: Anterior Nasal Swab  Result Value Ref Range Status   SARS Coronavirus 2 by RT PCR NEGATIVE NEGATIVE Final    Comment: (NOTE) SARS-CoV-2 target nucleic acids are NOT DETECTED.  The SARS-CoV-2 RNA is generally detectable in upper respiratory specimens during the acute phase of infection. The lowest concentration of SARS-CoV-2 viral copies this assay can detect is 138 copies/mL. A negative result does not preclude SARS-Cov-2 infection and should not be used as the sole basis for treatment or other patient management decisions. A negative result may occur with  improper specimen collection/handling, submission of specimen other than nasopharyngeal swab, presence of viral mutation(s) within the areas targeted by this assay, and inadequate number of viral copies(<138 copies/mL). A negative result must be combined with clinical observations, patient history, and epidemiological information. The expected result is Negative.  Fact Sheet for Patients:  bloggercourse.com  Fact Sheet for Healthcare Providers:   seriousbroker.it  This test is no t yet approved or cleared by the [place]  FDA and  has been authorized for detection and/or diagnosis of SARS-CoV-2 by FDA under an Emergency Use Authorization (EUA). This EUA will remain  in effect (meaning this test can be used) for the duration of the COVID-19 declaration under Section 564(b)(1) of the Act, 21 U.S.C.section 360bbb-3(b)(1), unless the authorization is terminated  or revoked sooner.       Influenza A by PCR NEGATIVE NEGATIVE Final   Influenza B by PCR NEGATIVE NEGATIVE Final    Comment: (NOTE) The Xpert Xpress SARS-CoV-2/FLU/RSV plus assay is intended as an aid in the diagnosis of influenza from Nasopharyngeal swab specimens and should not be used as a sole basis for treatment. Nasal washings and aspirates are unacceptable for Xpert Xpress SARS-CoV-2/FLU/RSV testing.  Fact Sheet for Patients: bloggercourse.com  Fact Sheet for Healthcare Providers: seriousbroker.it  This test is not yet approved or cleared by the [place]  FDA and has been authorized for detection and/or diagnosis of SARS-CoV-2 by FDA under an Emergency Use Authorization (EUA). This EUA will remain in effect (meaning this test can be used) for the duration of the COVID-19 declaration under Section 564(b)(1) of the Act, 21 U.S.C. section 360bbb-3(b)(1), unless the authorization is terminated or revoked.     Resp Syncytial Virus by PCR NEGATIVE NEGATIVE Final    Comment: (NOTE) Fact Sheet for Patients: bloggercourse.com  Fact Sheet for Healthcare Providers: seriousbroker.it  This test is not yet approved or cleared by the [place]  FDA and has been authorized for detection and/or diagnosis of SARS-CoV-2 by FDA under an Emergency Use Authorization (EUA). This EUA will remain in effect (meaning this test can be used) for  the duration of the COVID-19 declaration under Section 564(b)(1) of the Act, 21 U.S.C. section 360bbb-3(b)(1), unless the authorization is terminated or revoked.  Performed at [facility], [address] [place], [place] [address]     Coagulation Studies: Recent Labs    04/19/24 05/12/22 04/20/24 1326  LABPROT 20.4* 19.1*  INR 1.6* 1.5*    Urinalysis: No results for input(s): COLORURINE, LABSPEC, PHURINE, GLUCOSEU, HGBUR, BILIRUBINUR, KETONESUR, PROTEINUR, UROBILINOGEN, NITRITE,  LEUKOCYTESUR in the last 72 hours.  Invalid input(s): APPERANCEUR    Imaging: CT ABDOMEN PELVIS WO CONTRAST Result Date: 04/20/2024 EXAM: CT ABDOMEN AND PELVIS WITHOUT CONTRAST 04/20/2024 12:57:06 AM TECHNIQUE: CT of the abdomen and pelvis was performed without the administration of intravenous contrast. Multiplanar reformatted images are provided for review. Automated exposure control, iterative reconstruction, and/or weight-based adjustment of the mA/kV was utilized to reduce the radiation dose to as low as reasonably achievable. COMPARISON: 04/18/2020 CLINICAL HISTORY: Abdominal pain and bilious vomiting. FINDINGS: LOWER CHEST: Mild left basilar atelectasis is seen. Large right-sided pleural effusion is noted with right lower lobe consolidation. LIVER: The liver is unremarkable. GALLBLADDER AND BILE DUCTS: The gallbladder has been surgically removed. No biliary ductal dilatation. SPLEEN: No acute abnormality. PANCREAS: No acute abnormality. ADRENAL GLANDS: No acute abnormality. KIDNEYS, URETERS AND BLADDER: No stones in the kidneys or ureters. No hydronephrosis. No perinephric or periureteral stranding. The bladder is partially distended. Air is noted within the bladder, which may be related to recent instrumentation. . No findings to suggest fistulization are seen. GI AND BOWEL: Stomach demonstrates no acute abnormality. Small bowel is within normal limits. No obstructive or  inflammatory changes of the colon are seen. Scattered diverticular changes noted without diverticulitis. The appendix is within normal limits. There is no bowel obstruction. PERITONEUM AND RETROPERITONEUM: No free fluid is noted. No free air. VASCULATURE: Aortic calcifications are noted. LYMPH NODES: No lymphadenopathy. REPRODUCTIVE ORGANS: The uterus has been surgically removed. BONES AND SOFT TISSUES: No acute osseous abnormality. No focal soft tissue abnormality. IMPRESSION: 1. No evidence of bowel obstruction. 2. Large right pleural effusion with right lower lobe consolidation. Electronically signed by: Oneil Devonshire MD 04/20/2024 01:04 AM EST RP Workstation: [id number]   DG Chest 2 View Result Date: 04/19/2024 CLINICAL DATA:  Near syncope EXAM: CHEST - 2 VIEW COMPARISON:  02/23/2024 FINDINGS: Left-sided pacing device similar in position. Right-sided central venous catheter has been placed, tips overlie the SVC and proximal right atrium. Hypoventilatory changes. Suspected cardiomegaly. Moderate large right pleural effusion with airspace disease at the right base. No pneumothorax IMPRESSION: 1. Moderate to large right pleural effusion with airspace disease at the right base. 2. Cardiomegaly. Electronically Signed   By: Luke Bun M.D.   On: 04/19/2024 22:51     Medications:    heparin  750 Units/hr (04/20/24 1429)    carvedilol   6.25 mg Oral BID WC   Chlorhexidine  Gluconate Cloth  6 each Topical Q0600   furosemide   40 mg Intravenous BID   hydrALAZINE   50 mg Oral Q8H   insulin  glargine-yfgn  20 Units Subcutaneous QHS   pantoprazole   40 mg Oral Daily   acetaminophen  **OR** acetaminophen , morphine  injection, oxyCODONE , polyethylene glycol, promethazine   Assessment/ Plan:  Deborah Dawson is a 74 y.o.  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  Patient presents to ED with vomiting, syncope and fall. She is admitted for Fluid overload [E87.70]  [facility]/MWF/right IJ PermCath  End-stage renal disease with hyperkalemia on hemodialysis.  Last treatment received on Saturday.  Patient scheduled to receive dialysis today, UF goal 1.5 to 2 L as tolerated.  Potassium 5.7, will dialyze on 2K bath.  Next treatment scheduled for Friday.  2.  Generalized weakness, fall.CT abd pelvis negative. PT/OT eval  3. Anemia of chronic kidney disease Lab Results  Component Value Date  HGB 11.0 (L) 04/19/2024    Hgb stable, no need for ESA  4. Secondary Hyperparathyroidism: with outpatient labs: None Lab Results  Component Value Date   CALCIUM  9.3 04/19/2024   PHOS 4.2 02/24/2024    Will continue to monitor bone minerals.    LOS: 0 Zachary Lovins 1/28/20263:23 PM   "

## 2024-04-20 NOTE — ED Provider Notes (Signed)
 "  [facility] Provider Note    Event Date/Time   First MD Initiated Contact with Patient 04/19/24 2359     (approximate)   History   Emesis and Near Syncope   HPI  Deborah Dawson is a 74 y.o. female  MWF HD patient, went F and Sat (due to Monday closure). Due today.  Vomitting dark 'bile' since this AM yellow and nasty   No chest pain.  Some shortness of breath.  No abdominal pain.  Last bowel movement was this evening seem normal no black or bloody.  Has vomited twice today has ongoing mild nausea.  She also has noticed slight swelling in her feet and a slight increase in shortness of breath feels like fluid might be back on her lung reports that she had fluid drawn off her right lung about 8 times at [facility]    Reviewed external records from 04/12/24 (extentive admit at [facility]) Discharge Diagnoses:  Principal Problem:   Shock, cardiogenic (CMS/HHS-HCC) Active Problems:   DM2 (diabetes mellitus, type 2) (CMS/HHS-HCC)   Chronic a-fib (CMS/HHS-HCC)   Essential (primary) hypertension   Cardiac resynchronization therapy pacemaker (CRT-P)   Acute kidney injury superimposed on CKD   Pleural effusion on right   Acute on chronic combined systolic and diastolic CHF (congestive heart failure) (CMS/HHS-HCC)   PEA (Pulseless electrical activity) (CMS/HHS-HCC)   Oliguria and anuria   Decreased strength, endurance, and mobility   Decreased activities of daily living (ADL)   Restrictive lung disease   Hx pulmonary embolism   NICM (nonischemic cardiomyopathy) (CMS/HHS-HCC)   Nausea & vomiting   History of recurrent UTIs   CKD (chronic kidney disease)   Eczema, unspecified   Thrombocytopenia ()   Insomnia   Goals of care, counseling/discussion   Influenza A   Transaminitis   Constipation   Hyponatremia   Hyperphosphatemia Resolved Problems:   Bradycardia   CAP (community acquired pneumonia)   Acute respiratory insufficiency   Cough  Patient presented  for PEA arrest, now requiring iHD. Please see medication changes from this admission below.     - Started on apixaban  2.5 mg BID  - Started hydralazine  50 mg q8h    Past Medical History:  Diagnosis Date   Allergic genetic state    Atrial fibrillation (HCC) 2010   Cardiomyopathy (HCC)    CHF (congestive heart failure) (HCC)    Diabetes mellitus without complication (HCC)    Dysrhythmia    Gout    H/O cardiac catheterization    Hypertension    Joint pain    Osteopenia    Presence of permanent cardiac pacemaker    Psoriasis      Physical Exam   Triage Vital Signs: ED Triage Vitals  Encounter Vitals Group     BP 04/19/24 2219 116/79     Girls Systolic BP Percentile --      Girls Diastolic BP Percentile --      Boys Systolic BP Percentile --      Boys Diastolic BP Percentile --      Pulse Rate 04/19/24 2219 (!) 108     Resp 04/19/24 2219 18     Temp 04/19/24 2219 (!) 97.5 F (36.4 C)     Temp Source 04/19/24 2219 Axillary     SpO2 04/19/24 2219 97 %     Weight 04/19/24 2217 124 lb (56.2 kg)     Height 04/19/24 2217 5' 1 (1.549 m)     Head Circumference --  Peak Flow --      Pain Score 04/19/24 2217 8     Pain Loc --      Pain Education --      Exclude from Growth Chart --     Most recent vital signs: Vitals:   04/20/24 0130 04/20/24 0300  BP: 121/77 113/88  Pulse: 97 (!) 104  Resp: (!) 21 20  Temp: (!) 97.2 F (36.2 C) (!) 97.5 F (36.4 C)  SpO2:  96%     General: Awake, no distress.  CV:   Good peripheral perfusion. Normal rate and heart tones. Resp:   Normal effort except slight tachypnea and very mild accessory muscle use. Lung sounds clear left side, diminished right lung majority of. Speaking without distress. Abd:   No distention. Soft, non-tender to palpation in all quadrants. No rebound or guarding. Neuro:   No focal neuro deficits noted. Moves extremities well without noted concern. Other:  Trace edema in the legs bilateral.  Right upper  chest hemodialysis catheter clean dry intact   ED Results / Procedures / Treatments   Labs (all labs ordered are listed, but only abnormal results are displayed) Labs Reviewed  BASIC METABOLIC PANEL WITH GFR - Abnormal; Notable for the following components:      Result Value   Sodium 132 (*)    Potassium 5.7 (*)    Chloride 93 (*)    Glucose, Bld 131 (*)    BUN 37 (*)    Creatinine, Ser 3.05 (*)    GFR, Estimated 15 (*)    All other components within normal limits  CBC - Abnormal; Notable for the following components:   RBC 3.66 (*)    Hemoglobin 11.0 (*)    HCT 33.6 (*)    RDW 19.4 (*)    nRBC 0.6 (*)    All other components within normal limits  PROTIME-INR - Abnormal; Notable for the following components:   Prothrombin Time 20.4 (*)    INR 1.6 (*)    All other components within normal limits  PRO BRAIN NATRIURETIC PEPTIDE - Abnormal; Notable for the following components:   Pro Brain Natriuretic Peptide >35,000.0 (*)    All other components within normal limits  HEPATIC FUNCTION PANEL - Abnormal; Notable for the following components:   Bilirubin, Direct 0.4 (*)    All other components within normal limits  TROPONIN T, HIGH SENSITIVITY - Abnormal; Notable for the following components:   Troponin T High Sensitivity 162 (*)    All other components within normal limits  TROPONIN T, HIGH SENSITIVITY - Abnormal; Notable for the following components:   Troponin T High Sensitivity 158 (*)    All other components within normal limits  LIPASE, BLOOD   Mildly elevated troponin, in keeping with historical.  And severe departure from baseline.  Hyperkalemia mild but in the setting of due for dialysis today.  INR elevated as anticipated she reports compliance with apixaban   EKG  I independently reviewed the EKG at 2230 heart rate 120 QRS 130 QTc 540.  Paced.  Left bundle branch block with ventricular pacing.  Occasional large pacing or amplitude spikes, query if at times having  different pacing voltages.  Underlying A-fib   RADIOLOGY I independently reviewed images of chest x-ray which shows at least moderate right-sided pleural effusion      PROCEDURES:  Critical Care performed: No  Procedures   MEDICATIONS ORDERED IN ED: Medications  LORazepam  (ATIVAN ) injection 0.5 mg (0.5 mg Intravenous Given 04/20/24  9953)  furosemide  (LASIX ) injection 40 mg (40 mg Intravenous Given 04/20/24 0258)  sodium zirconium cyclosilicate  (LOKELMA ) packet 10 g (10 g Oral Given 04/20/24 0304)     IMPRESSION / MDM / ASSESSMENT AND PLAN / ED COURSE  I reviewed the triage vital signs and the nursing notes.                              Based on presentation, the differential diagnosis includes, but is not limited to key considerations: Multifactorial.  Complicated history.  Concern for her vomiting with nausea.  Will give Ativan  as she reports severe possible reaction such as cardiac dysrhythmia or arrest after Zofran .  No abdominal pain no abdomen soft nontender nondistended.  She does have peripheral edema query if volume overload, gastritis, reflux, ileus from fluid shifts and new onset of hemodialysis, etc.  She does not have any symptoms that overtly clearly demonstrate infection.  Pending UA.  Afebrile normal white count.  Abdomen soft nontender nondistended throughout and she reports no pain.  No cardiac symptoms.  Some shortness of breath likely attributable to volume increased edema in the lower extremities as well as recurrent right-sided pleural effusion.  Broad differential workup broadly.  Pending CT scan of the abdomen pelvis if no obstruction would consider giving potassium lowering oral solution. Notification of case sent via [facility] to Dr. Marcelino and consutl placed in chart orders.  Because of her increasing dyspnea, symptoms of vomiting, complicated medical history and what appears to be recurrent thoracentesis that is symptomatic as well as evidence of volume overload I  think she will need admission as well as nephrology consult.  Nephrology has been consulted  Patient's presentation is most consistent with acute complicated illness / injury requiring diagnostic workup.    The patient is on the cardiac monitor to evaluate for evidence of arrhythmia and/or significant heart rate changes.  Clinical Course as of 04/20/24 0356  Wed Apr 20, 2024  0041 Patient reports decision to be FULL CODE now that she is out of [facility]. Thinks used to be DNR but now wants FULL CODE [MQ]  [phone] Patient accepted to hospital service under the care of Dr. Lawence [MQ]    Clinical Course User Index [MQ] Dicky Anes, MD     FINAL CLINICAL IMPRESSION(S) / ED DIAGNOSES   Final diagnoses:  Nausea and vomiting, unspecified vomiting type  Pleural effusion  Hypervolemia, unspecified hypervolemia type  Dyspnea, unspecified type  Hyperkalemia     Rx / DC Orders   ED Discharge Orders     None        Note:  This document was prepared using Dragon voice recognition software and may include unintentional dictation errors.   Dicky Anes, MD 04/20/24 [phone]  "

## 2024-04-20 NOTE — Progress Notes (Signed)
 Heart Failure Navigator Progress Note  Assessed for Heart & Vascular TOC clinic readiness.  Patient does not meet criteria due to current [facility] patient also on Hemodialysis.   Navigator will sign off at this time.  Charmaine Pines, RN, BSN [facility] Heart Failure Navigator Secure Chat Only

## 2024-04-20 NOTE — Progress Notes (Signed)
 Hemodialysis Note:  Received patient in bed to unit. Alert and oriented. Informed consent singed and in chart.  Treatment initiated: 1623 Treatment completed: 1830  Access used: Right internal jugular catheter Access issues: None  Patient having some hypoglycemic episodes during dialysis.Offered some crackers and juice. MD made aware, D 50 % 1 vial given during dialysis.Transported back to room, alert without acute distress. Report given to patient's RN.  Total UF removed: 1.7 Liter Medications given: D 50% 1 vial  Post HD weight: 54.5 Kg  [facility]

## 2024-04-21 ENCOUNTER — Inpatient Hospital Stay

## 2024-04-21 DIAGNOSIS — E8770 Fluid overload, unspecified: Secondary | ICD-10-CM | POA: Diagnosis not present

## 2024-04-21 LAB — COMPREHENSIVE METABOLIC PANEL WITH GFR
ALT: 45 U/L — ABNORMAL HIGH (ref 0–44)
AST: 69 U/L — ABNORMAL HIGH (ref 15–41)
Albumin: 3.4 g/dL — ABNORMAL LOW (ref 3.5–5.0)
Alkaline Phosphatase: 92 U/L (ref 38–126)
Anion gap: 14 (ref 5–15)
BUN: 27 mg/dL — ABNORMAL HIGH (ref 8–23)
CO2: 24 mmol/L (ref 22–32)
Calcium: 8.8 mg/dL — ABNORMAL LOW (ref 8.9–10.3)
Chloride: 94 mmol/L — ABNORMAL LOW (ref 98–111)
Creatinine, Ser: 2.59 mg/dL — ABNORMAL HIGH (ref 0.44–1.00)
GFR, Estimated: 19 mL/min — ABNORMAL LOW
Glucose, Bld: 88 mg/dL (ref 70–99)
Potassium: 4.6 mmol/L (ref 3.5–5.1)
Sodium: 131 mmol/L — ABNORMAL LOW (ref 135–145)
Total Bilirubin: 0.9 mg/dL (ref 0.0–1.2)
Total Protein: 7.1 g/dL (ref 6.5–8.1)

## 2024-04-21 LAB — BLOOD GAS, VENOUS
Acid-base deficit: 17 mmol/L — ABNORMAL HIGH (ref 0.0–2.0)
Bicarbonate: 10.3 mmol/L — ABNORMAL LOW (ref 20.0–28.0)
O2 Saturation: 64.9 %
Patient temperature: 37
pCO2, Ven: 29 mmHg — ABNORMAL LOW (ref 44–60)
pH, Ven: 7.16 — CL (ref 7.25–7.43)
pO2, Ven: 46 mmHg — ABNORMAL HIGH (ref 32–45)

## 2024-04-21 LAB — CBC
HCT: 32.5 % — ABNORMAL LOW (ref 36.0–46.0)
Hemoglobin: 10.7 g/dL — ABNORMAL LOW (ref 12.0–15.0)
MCH: 29.9 pg (ref 26.0–34.0)
MCHC: 32.9 g/dL (ref 30.0–36.0)
MCV: 90.8 fL (ref 80.0–100.0)
Platelets: 229 10*3/uL (ref 150–400)
RBC: 3.58 MIL/uL — ABNORMAL LOW (ref 3.87–5.11)
RDW: 19 % — ABNORMAL HIGH (ref 11.5–15.5)
WBC: 6.7 10*3/uL (ref 4.0–10.5)
nRBC: 0.6 % — ABNORMAL HIGH (ref 0.0–0.2)

## 2024-04-21 LAB — CBG MONITORING, ED
Glucose-Capillary: 58 mg/dL — ABNORMAL LOW (ref 70–99)
Glucose-Capillary: 253 mg/dL — ABNORMAL HIGH (ref 70–99)
Glucose-Capillary: 64 mg/dL — ABNORMAL LOW (ref 70–99)
Glucose-Capillary: 105 mg/dL — ABNORMAL HIGH (ref 70–99)
Glucose-Capillary: 66 mg/dL — ABNORMAL LOW (ref 70–99)
Glucose-Capillary: 142 mg/dL — ABNORMAL HIGH (ref 70–99)

## 2024-04-21 LAB — HEPATITIS B SURFACE ANTIBODY, QUANTITATIVE: Hep B S AB Quant (Post): 343 m[IU]/mL

## 2024-04-21 LAB — APTT: aPTT: >200 s (ref 24–36)

## 2024-04-21 LAB — HEPARIN LEVEL (UNFRACTIONATED): Heparin Unfractionated: >1.1 [IU]/mL — ABNORMAL HIGH (ref 0.30–0.70)

## 2024-04-21 MED ORDER — EPINEPHRINE 1 MG/10ML IV SOSY
PREFILLED_SYRINGE | INTRAVENOUS | Status: AC | PRN
  Administered 2024-04-21: 1 mg via INTRAVENOUS

## 2024-04-21 MED ORDER — EPINEPHRINE HCL 5 MG/250ML IV SOLN IN NS
INTRAVENOUS | Status: AC
  Administered 2024-04-21: 5 ug/min via INTRAVENOUS
  Filled 2024-04-21: qty 250

## 2024-04-21 MED ORDER — ROCURONIUM BROMIDE 10 MG/ML (PF) SYRINGE
PREFILLED_SYRINGE | INTRAVENOUS | Status: AC
  Administered 2024-04-21: 100 mg via INTRAVENOUS
  Filled 2024-04-21: qty 10

## 2024-04-21 MED ORDER — EPINEPHRINE HCL 5 MG/250ML IV SOLN IN NS: 0.5000 ug/min | INTRAVENOUS | Status: DC

## 2024-04-21 MED ORDER — CALCIUM CHLORIDE 10 % IV SOLN
INTRAVENOUS | Status: AC | PRN
  Administered 2024-04-21: 1 g via INTRAVENOUS

## 2024-04-21 MED ORDER — AMIODARONE HCL IN DEXTROSE 360-4.14 MG/200ML-% IV SOLN: 60.0000 mg/h | INTRAVENOUS | Status: DC

## 2024-04-21 MED ORDER — SODIUM BICARBONATE 8.4 % IV SOLN
INTRAVENOUS | Status: AC | PRN
  Administered 2024-04-21: 50 meq via INTRAVENOUS

## 2024-04-21 MED ORDER — SODIUM BICARBONATE 8.4 % IV SOLN
INTRAVENOUS | Status: AC
  Filled 2024-04-21: qty 50

## 2024-04-21 MED ORDER — ETOMIDATE 2 MG/ML IV SOLN
INTRAVENOUS | Status: AC
  Filled 2024-04-21: qty 10

## 2024-04-21 MED ORDER — EPINEPHRINE 1 MG/10ML IV SOSY
PREFILLED_SYRINGE | INTRAVENOUS | Status: AC | PRN
  Administered 2024-04-21 (×5): 1 mg via INTRAVENOUS

## 2024-04-21 MED ORDER — EPINEPHRINE 1 MG/10ML IV SOSY
PREFILLED_SYRINGE | INTRAVENOUS | Status: AC
  Filled 2024-04-21: qty 50

## 2024-04-21 MED ORDER — ETOMIDATE 2 MG/ML IV SOLN
INTRAVENOUS | Status: AC
  Administered 2024-04-21: 20 mg via INTRAVENOUS
  Filled 2024-04-21: qty 10

## 2024-04-21 MED ORDER — ROCURONIUM BROMIDE 10 MG/ML (PF) SYRINGE: 100.0000 mg | PREFILLED_SYRINGE | Freq: Once | INTRAVENOUS | Status: AC

## 2024-04-21 MED ORDER — INSULIN ASPART 100 UNIT/ML IJ SOLN: 0.0000 [IU] | Freq: Every day | INTRAMUSCULAR | Status: DC

## 2024-04-21 MED ORDER — INSULIN ASPART 100 UNIT/ML IJ SOLN: 0.0000 [IU] | Freq: Three times a day (TID) | INTRAMUSCULAR | Status: DC

## 2024-04-21 MED ORDER — AMIODARONE HCL IN DEXTROSE 360-4.14 MG/200ML-% IV SOLN: 30.0000 mg/h | INTRAVENOUS | Status: DC

## 2024-04-21 MED ORDER — AMIODARONE IV BOLUS ONLY 150 MG/100ML
300.0000 mg | Freq: Once | INTRAVENOUS | Status: AC
  Administered 2024-04-21: 300 mg via INTRAVENOUS

## 2024-04-21 MED ORDER — SODIUM CHLORIDE 0.9 % IV SOLN
INTRAVENOUS | Status: AC | PRN
  Administered 2024-04-21: 1000 mL via INTRAVENOUS

## 2024-04-21 MED ORDER — ETOMIDATE 2 MG/ML IV SOLN: 20.0000 mg | Freq: Once | INTRAVENOUS | Status: AC

## 2024-04-21 MED ORDER — SODIUM BICARBONATE 8.4 % IV SOLN
INTRAVENOUS | Status: AC | PRN
  Administered 2024-04-21 (×2): 50 meq via INTRAVENOUS

## 2024-04-21 MED FILL — Medication: Qty: 1 | Status: AC

## 2024-04-22 LAB — BLOOD CULTURE ID PANEL (REFLEXED) - BCID2

## 2024-04-24 NOTE — ED Notes (Signed)
 1 shock delivered  200J

## 2024-04-24 NOTE — ED Notes (Signed)
 Pt reports difficulty assessing oral temperature

## 2024-04-24 NOTE — Progress Notes (Signed)
 PHARMACY - ANTICOAGULATION CONSULT NOTE  Pharmacy Consult for Heparin  Infusion Indication: atrial fibrillation  Allergies[1]  Patient Measurements: Height: 5' 1 (154.9 cm) Weight: 54.5 kg (120 lb 2.4 oz) IBW/kg (Calculated) : 47.8 HEPARIN  DW (KG): 56.2  Vital Signs: Temp: 97.7 F (36.5 C) (01/28 2328) Temp Source: Oral (01/28 2328) BP: 108/64 05-20-2024 0000) Pulse Rate: 94 May 20, 2024 0000)  Labs: Recent Labs    04/19/24 2224 04/20/24 1326 04/20/24 1425 04/20/24 1625 04/20/24 2029  HGB 11.0*  --   --  10.5*  --   HCT 33.6*  --   --  32.4*  --   PLT 227  --   --  228  --   APTT  --  43*  --   --  147*  LABPROT 20.4* 19.1*  --   --   --   INR 1.6* 1.5*  --   --   --   HEPARINUNFRC  --  >1.10*  --   --   --   CREATININE 3.05*  --  3.41*  --   --     Estimated Creatinine Clearance: 10.8 mL/min (A) (by C-G formula based on SCr of 3.41 mg/dL (H)).   Medical History: Past Medical History:  Diagnosis Date   Allergic genetic state    Atrial fibrillation (HCC) 2010   Cardiomyopathy (HCC)    CHF (congestive heart failure) (HCC)    Diabetes mellitus without complication (HCC)    Dysrhythmia    Gout    H/O cardiac catheterization    Hypertension    Joint pain    Osteopenia    Presence of permanent cardiac pacemaker    Psoriasis     Medications:  Apixaban  2.5 mg PO twice daily- last dose 1/27 @ 1800 Heparin  SQ Q8H- last dose 1/28 @ 0944  Assessment: Deborah Dawson is a pleasant 74 y.o. female with medical history significant for ESRD on hemodialysis Monday Wednesday Friday, atrial fibrillation, cardiomyopathy/CHF, DM, gout, HTN who was recently admitted and discharged from [facility] between 02/25/2024 and 04/12/2024 after being treated for similar problems including fluid overload, pleural effusion, new PE, brought into [facility] ED for evaluation of a bilious vomiting began yesterday morning and she had a presyncopal episode leading to fall and hit head on the floor. Pharmacy  consulted to initiate patient on heparin  infusion.   Baseline labs ordered. Hgb 11. PLT 227. No signs/symptoms of bleeding noted.  Goal of Therapy:  Heparin  level 0.3-0.7 units/ml aPTT 66-102 seconds Monitor platelets by anticoagulation protocol: Yes   Plan:  1/28: aPTT @ 2029 = 147, elevated - will hold heparin  infusion for 1 hr then restart at 550 units/hr - recheck aPTT and HL 8 hrs after restart  - Monitor via aPTT levels until HL correlating - Monitor CBC daily while on heparin   Hersey Maclellan D, PharmD 05-20-24,12:43 AM       [1]  Allergies Allergen Reactions   Zofran  [Ondansetron ]     Reports caused or may have caused cardiac arrest   Doxazosin Other (See Comments)    Cardura - cough   Doxycycline      Abdominal pain   Drug Class [Clindamycin/Lincomycin] Hives   Hydralazine  Hcl Other (See Comments)    gout   Metformin  And Related Swelling   Ciprofloxacin Other (See Comments)    Numbness in face   Iodine Other (See Comments)    NOT CT CONTRAST PER PT   Pioglitazone Other (See Comments)   Sacubitril-Valsartan Cough    Pt  states that it gave her a cough and chest palpitations   Semaglutide Nausea Only   Sulfamethoxazole-Trimethoprim Hives   Augmentin [Amoxicillin-Pot Clavulanate] Diarrhea   Dulaglutide Nausea Only   Hctz [Hydrochlorothiazide] Other (See Comments)    Gout   Losartan Diarrhea   Poison Oak Extract Rash   Red Dye #40 (Allura Red) Rash

## 2024-04-24 NOTE — ED Notes (Signed)
 Pt POCT CBG resulted as 64. Pt awoken and provided w/ cranberry juice and graham crackers. Pt educated and instructed on importance of eating and drinking in order to prevent hypoglycemic episode. Pt currently drinking juice and eating graham crackers.

## 2024-04-24 NOTE — ED Provider Notes (Signed)
 Code blue  .Critical Care  Performed by: Suzanne Kirsch, MD Authorized by: Suzanne Kirsch, MD   Critical care provider statement:    Critical care time (minutes):  60   Critical care was necessary to treat or prevent imminent or life-threatening deterioration of the following conditions:  Cardiac failure   Critical care was time spent personally by me on the following activities:  Development of treatment plan with patient or surrogate, discussions with consultants, evaluation of patient's response to treatment, examination of patient, ordering and review of laboratory studies, ordering and review of radiographic studies, ordering and performing treatments and interventions, pulse oximetry, re-evaluation of patient's condition and review of old charts Procedure Name: Intubation Date/Time: 05-12-2024 12:07 PM  Performed by: Suzanne Kirsch, MDPre-anesthesia Checklist: Suction available, Emergency Drugs available, Patient identified, Patient being monitored and Timeout performed Oxygen Delivery Method: Ambu bag Preoxygenation: Pre-oxygenation with 100% oxygen Induction Type: Rapid sequence Ventilation: Mask ventilation without difficulty Laryngoscope Size: Glidescope and 3 Grade View: Grade I Tube size: 7.5 mm Number of attempts: 1 Airway Equipment and Method: Video-laryngoscopy Placement Confirmation: ETT inserted through vocal cords under direct vision, Positive ETCO2, CO2 detector and Breath sounds checked- equal and bilateral Secured at: 22 cm Tube secured with: ETT holder Dental Injury: Teeth and Oropharynx as per pre-operative assessment  Future Recommendations: Recommend- induction with short-acting agent, and alternative techniques readily available    CPR  Date/Time: 05-12-24 12:07 PM  Performed by: Suzanne Kirsch, MD Authorized by: Suzanne Kirsch, MD  CPR Procedure Details:      Amount of time prior to administration of ACLS/BLS (minutes):  2   ACLS/BLS initiated by EMS:  No     CPR/ACLS performed in the ED: Yes     Duration of CPR (minutes):  18   Outcome: ROSC obtained    CPR performed via ACLS guidelines under my direct supervision.  See RN documentation for details including defibrillator use, medications, doses and timing.   Patient was currently in the emergency department under hospitalist care.  Has poor heart failure and is a dialysis patient.  Patient lost pulses.  Initiated CPR, given epinephrine , calcium  and bicarb.  Started on epi drip.  Intubated for airway protection and respiratory failure.  ROSC achieved after 18 minutes.  Increased epi drip.  Bedside cardiac ultrasound with no pericardial effusion but has very poor squeeze and a very poor EF.  Goals of care conversation with husband.  Made DNR after CPR was already initiated.  Patient was initially full code.  Had another goals of care conversation with the patient's husband at bedside.  Despite being on an epi drip of 20 patient did not have any cardiac activity.  Time of death 17-May-1110.  Chaplain at bedside.  Hospitalist and ICU team notified.   Suzanne Kirsch, MD 12-May-2024 1210

## 2024-04-24 NOTE — Significant Event (Signed)
 Called to patient's room for code blue. Resuscitation underway led by EDP. Had received two doses epi, calcium , bicarb. Preparing for intubation, no shockable rhythm yet. Met with husband, he is aware she suffers from multi-organ failure (end-stage CHF, ESRD, recurrent pleural effusion). He is aware that if resuscitation successful chances of meaningful recovery are slim. He opted to make the patient DNR. Returned to patient's room where informed ROSC obtained, patient now intubated. Discussed again with husband, he affirms continue DNR should she code again but for now continue current scope of care, he wishes to speak with family before making additional decisions. Sign-out provided to ICU attending.

## 2024-04-24 NOTE — Progress Notes (Signed)
 PT Cancellation Note  Patient Details Name: Deborah Dawson MRN: [id number] DOB: [birth year]   Cancelled Treatment:    Reason Eval/Treat Not Completed: Patient not medically ready (Code blue called this morning just before evalaution attempt. WIll complete PT order at this time and await new order once pt is medically appropriate for PT evaluation.)  11:04 AM, 04-26-24 [name] Linear, PT, DPT Physical Therapist - [facility]  [phone] (ASCOM)    [name] 2024/04/26, 11:04 AM

## 2024-04-24 NOTE — ED Notes (Signed)
 No heart sounds auscultated by EDP

## 2024-04-24 NOTE — ED Notes (Signed)
 Pt's Husband given Patient Placement card to call when he has chosen a funeral home.

## 2024-04-24 NOTE — ED Notes (Addendum)
 Heparin  level and aptt were scheduled to be collected at midnight. Deborah Dawson, [facility] was contacted to confirm whether labs should be drawn or held until the morning, 8 hrs after restarting heparin , as previously discussed. Deborah ordered to hold until 0800 and other labs were discontinued for that time

## 2024-04-24 NOTE — ED Notes (Signed)
 No pulse noted on US  by EDP. BP 55/43.

## 2024-04-24 NOTE — ED Notes (Signed)
 7.5 tube  22 at lips

## 2024-04-24 NOTE — ED Notes (Signed)
 Infusions stopped

## 2024-04-24 NOTE — Consult Note (Addendum)
 " [facility] CLINIC CARDIOLOGY CONSULT NOTE       Patient ID: Deborah Dawson MRN: [id number] DOB/AGE: [birth year] 76 y.o.  Admit date: 04/19/2024 Referring Physician Dr. Devaughn Ban Primary Physician Sadie Manna, MD  Primary Cardiologist Dr. Ammon Reason for Consultation AF CHF  HPI: Deborah Dawson is a 74 y.o. female  with a past medical history of end-stage heart failure, ischemic cardiomyopathy, recurrent pleural effusions, s/p CRT-P 07/2021, ESRD on HD, persistent atrial fibrillation, hypertension, type 2 diabetes who presented to the ED on 04/19/2024 for shortness of breath, nausea and vomiting. Cardiology was consulted for further evaluation.   For evaluation of shortness of breath and nausea/vomiting.  Had possible missed dialysis session.  Workup in the ED notable for creatinine 3.05, potassium 5.7, hemoglobin 11.0, WBC 8.3. Troponins 162 > 158, BNP >35,000. EKG in the ED atrial fibrillation with ventricular pacing rate 120 bpm.  Chest x-ray with moderate to large right pleural effusion.  CT abdomen pelvis without acute intra-abdominal abnormality.  Pulmonology is planning for chest tube insertion for recurrent pleural effusion.  At the time my evaluation this morning, patient is resting comfortably in hospital bed.  We discussed her symptoms in further detail.  She endorses breathing that was gradually worsening with associated nausea and vomiting.  Also reported some lower extremity edema which she feels is improved overall today.  States that she did have outpatient dialysis but there is some questionable missed session.  She is planning for dialysis tomorrow inpatient.  Review of systems complete and found to be negative unless listed above    Past Medical History:  Diagnosis Date   Allergic genetic state    Atrial fibrillation (HCC) 2010   Cardiomyopathy (HCC)    CHF (congestive heart failure) (HCC)    Diabetes mellitus without complication (HCC)    Dysrhythmia    Gout    H/O  cardiac catheterization    Hypertension    Joint pain    Osteopenia    Presence of permanent cardiac pacemaker    Psoriasis     Past Surgical History:  Procedure Laterality Date   ABDOMINAL HYSTERECTOMY  1985   BREAST EXCISIONAL BIOPSY Left    negative years ago   BREAST SURGERY Left 1997   papilloma   CARDIAC CATHETERIZATION Left 08/01/2015   Procedure: Left Heart Cath and Coronary Angiography;  Surgeon: Vinie DELENA Jude, MD;  Location: [facility] INVASIVE CV LAB;  Service: Cardiovascular;  Laterality: Left;   CHOLECYSTECTOMY  1986   COLONOSCOPY WITH PROPOFOL  N/A 08/26/2017   Procedure: COLONOSCOPY WITH PROPOFOL ;  Surgeon: Toledo, Ladell POUR, MD;  Location: [facility] ENDOSCOPY;  Service: Gastroenterology;  Laterality: N/A;   DIALYSIS/PERMA CATHETER INSERTION N/A 02/24/2024   Procedure: DIALYSIS/PERMA CATHETER INSERTION;  Surgeon: Marea Selinda RAMAN, MD;  Location: [facility] INVASIVE CV LAB;  Service: Cardiovascular;  Laterality: N/A;   ESOPHAGOGASTRODUODENOSCOPY (EGD) WITH PROPOFOL  N/A 08/26/2017   Procedure: ESOPHAGOGASTRODUODENOSCOPY (EGD) WITH PROPOFOL ;  Surgeon: Toledo, Ladell POUR, MD;  Location: [facility] ENDOSCOPY;  Service: Gastroenterology;  Laterality: N/A;   ESOPHAGOGASTRODUODENOSCOPY (EGD) WITH PROPOFOL  N/A 07/04/2019   Procedure: ESOPHAGOGASTRODUODENOSCOPY (EGD) WITH PROPOFOL ;  Surgeon: Toledo, Ladell POUR, MD;  Location: [facility] ENDOSCOPY;  Service: Gastroenterology;  Laterality: N/A;   ESOPHAGOGASTRODUODENOSCOPY (EGD) WITH PROPOFOL  N/A 06/12/2023   Procedure: ESOPHAGOGASTRODUODENOSCOPY (EGD) WITH PROPOFOL ;  Surgeon: Maryruth Ole DASEN, MD;  Location: [facility] ENDOSCOPY;  Service: Endoscopy;  Laterality: N/A;  DM   TUBAL LIGATION  1981    (Not in a hospital admission)  Social History  Socioeconomic History   Marital status: Married    Spouse name: Not on file   Number of children: Not on file   Years of education: Not on file   Highest education level: Not on file  Occupational History   Not on file   Tobacco Use   Smoking status: Never   Smokeless tobacco: Never  Vaping Use   Vaping status: Never Used  Substance and Sexual Activity   Alcohol use: No    Alcohol/week: 0.0 standard drinks of alcohol   Drug use: No   Sexual activity: Not on file    Comment: Married   Other Topics Concern   Not on file  Social History Narrative   Not on file   Social Drivers of Health   Tobacco Use: Low Risk (04/19/2024)   Patient History    Smoking Tobacco Use: Never    Smokeless Tobacco Use: Never    Passive Exposure: Not on file  Financial Resource Strain: Low Risk  (02/25/2024)   Received from [facility]   Overall Financial Resource Strain (CARDIA)    Difficulty of Paying Living Expenses: Not hard at all  Food Insecurity: No Food Insecurity (02/25/2024)   Received from [facility]   Epic    Within the past 12 months, you worried that your food would run out before you got the money to buy more.: Never true    Within the past 12 months, the food you bought just didn't last and you didn't have money to get more.: Never true  Transportation Needs: No Transportation Needs (02/25/2024)   Received from [facility] - Transportation    In the past 12 months, has lack of transportation kept you from medical appointments or from getting medications?: No    Lack of Transportation (Non-Medical): No  Physical Activity: Not on file  Stress: Not on file  Social Connections: Patient Unable To Answer (02/10/2024)   Social Connection and Isolation Panel    Frequency of Communication with Friends and Family: Patient unable to answer    Frequency of Social Gatherings with Friends and Family: Patient unable to answer    Attends Religious Services: Patient unable to answer    Active Member of Clubs or Organizations: Patient unable to answer    Attends Banker Meetings: Patient unable to answer    Marital Status: Patient unable to  answer  Intimate Partner Violence: Patient Unable To Answer (02/10/2024)   Epic    Fear of Current or Ex-Partner: Patient unable to answer    Emotionally Abused: Patient unable to answer    Physically Abused: Patient unable to answer    Sexually Abused: Patient unable to answer  Depression (PHQ2-9): Not on file  Alcohol Screen: Not on file  Housing: Low Risk  (02/25/2024)   Received from [facility]   Epic    In the last 12 months, was there a time when you were not able to pay the mortgage or rent on time?: No    In the past 12 months, how many times have you moved where you were living?: 0    At any time in the past 12 months, were you homeless or living in a shelter (including now)?: No  Utilities: Not At Risk (02/25/2024)   Received from [facility]   Epic    In the past 12 months has the electric, gas, oil, or water  company threatened to  shut off services in your home?: No  Health Literacy: Not on file    Family History  Problem Relation Age of Onset   Cancer Mother 44       breast   Diabetes Mother    Breast cancer Mother 40   Coronary artery disease Mother    Stroke Father    Coronary artery disease Father    Diabetes Son      Vitals:   05-02-2024 0600 05/02/24 0606 2024/05/02 0700 May 02, 2024 0730  BP: (!) 129/106 (!) 129/106 (!) 128/93 (!) 135/91  Pulse: 99  97 (!) 101  Resp: 17  20 19   Temp: 97.6 F (36.4 C)  98 F (36.7 C)   TempSrc: Axillary  Oral   SpO2: 100%  100% 100%  Weight:      Height:        PHYSICAL EXAM General: Chronically ill-appearing elderly female, well nourished, in no acute distress. HEENT: Normocephalic and atraumatic. Neck: No JVD.  Lungs: Normal respiratory effort on room air.  Diminished bilaterally right > left. Heart: HRRR. Normal S1 and S2 without gallops or murmurs.  Abdomen: Non-distended appearing.  Msk: Normal strength and tone for age. Extremities: Warm and well perfused. No clubbing, cyanosis.   1+ pitting edema.  Neuro: Alert and oriented X 3. Psych: Answers questions appropriately.   Labs: Basic Metabolic Panel: Recent Labs    04/20/24 1425 May 02, 2024 0826  NA 134* 131*  K 6.0* 4.6  CL 96* 94*  CO2 24 24  GLUCOSE 43* 88  BUN 43* 27*  CREATININE 3.41* 2.59*  CALCIUM  9.2 8.8*  PHOS 5.4*  --    Liver Function Tests: Recent Labs    04/20/24 0019 04/20/24 1425 2024-05-02 0826  AST 33  --  69*  ALT 30  --  45*  ALKPHOS 79  --  92  BILITOT 0.7  --  0.9  PROT 7.1  --  7.1  ALBUMIN 3.6 3.5 3.4*   Recent Labs    04/20/24 0019  LIPASE 35   CBC: Recent Labs    04/20/24 1625 2024/05/02 0826  WBC 7.2 6.7  HGB 10.5* 10.7*  HCT 32.4* 32.5*  MCV 90.0 90.8  PLT 228 229   Cardiac Enzymes: No results for input(s): CKTOTAL, CKMB, CKMBINDEX, TROPONINIHS in the last 72 hours. BNP: No results for input(s): BNP in the last 72 hours. D-Dimer: No results for input(s): DDIMER in the last 72 hours. Hemoglobin A1C: No results for input(s): HGBA1C in the last 72 hours. Fasting Lipid Panel: No results for input(s): CHOL, HDL, LDLCALC, TRIG, CHOLHDL, LDLDIRECT in the last 72 hours. Thyroid Function Tests: No results for input(s): TSH, T4TOTAL, T3FREE, THYROIDAB in the last 72 hours.  Invalid input(s): FREET3 Anemia Panel: No results for input(s): VITAMINB12, FOLATE, FERRITIN, TIBC, IRON, RETICCTPCT in the last 72 hours.   Radiology: ECHOCARDIOGRAM COMPLETE Result Date: 04/20/2024    ECHOCARDIOGRAM REPORT   Patient Name:   Deborah Dawson Trost Date of Exam: 04/20/2024 Medical Rec #:  [id number]    Height:       61.0 in Accession #:    [id number]   Weight:       124.0 lb Date of Birth:  June 12, 1950    BSA:          1.541 m Patient Age:    73 years     BP:           116/89 mmHg Patient Gender: F  HR:           102 bpm. Exam Location:  [facility] Procedure: 2D Echo (Both Spectral and Color Flow Doppler were utilized during             procedure). Indications:     Congestive Heart Failure  History:         Patient has prior history of Echocardiogram examinations. CHF,                  Angina and CAD, Pacemaker, Arrythmias:Atrial Fibrillation,                  Signs/Symptoms:Chest Pain and Hypertensive Heart Disease; Risk                  Factors:Hypertension.  Sonographer:     Rainelle Gull Referring Phys:  [id number] [facility] PAUDEL Diagnosing Phys: Keller Paterson IMPRESSIONS  1. Left ventricular ejection fraction, by estimation, is <20%. Left ventricular ejection fraction by 2D MOD biplane is 10.6 %. The left ventricle has severely decreased function. The left ventricle demonstrates regional wall motion abnormalities (see scoring diagram/findings for description). The left ventricular internal cavity size was mildly dilated. There is mild left ventricular hypertrophy. Left ventricular diastolic parameters are consistent with Grade II diastolic dysfunction (pseudonormalization).  2. Right ventricular systolic function is moderately reduced. The right ventricular size is normal. There is moderately elevated pulmonary artery systolic pressure. The estimated right ventricular systolic pressure is 50.5 mmHg.  3. Left atrial size was moderately dilated.  4. Right atrial size was severely dilated.  5. Moderate pleural effusion.  6. The mitral valve is normal in structure. Mild to moderate mitral valve regurgitation.  7. Tricuspid valve regurgitation is moderate.  8. The aortic valve is tricuspid. Aortic valve regurgitation is not visualized.  9. The inferior vena cava is dilated in size with <50% respiratory variability, suggesting right atrial pressure of 15 mmHg. FINDINGS  Left Ventricle: Left ventricular ejection fraction, by estimation, is <20%. Left ventricular ejection fraction by 2D MOD biplane is 10.6 %. The left ventricle has severely decreased function. The left ventricle demonstrates regional wall motion abnormalities. The left ventricular internal  cavity size was mildly dilated. There is mild left ventricular hypertrophy. Left ventricular diastolic parameters are consistent with Grade II diastolic dysfunction (pseudonormalization).  LV Wall Scoring: The mid and distal anterior septum, entire apex, basal inferolateral segment, and basal inferior segment are akinetic. The anterior wall, antero-lateral wall, basal anteroseptal segment, mid inferolateral segment, mid inferoseptal segment, mid inferior segment, and basal inferoseptal segment are hypokinetic. Right Ventricle: The right ventricular size is normal. No increase in right ventricular wall thickness. Right ventricular systolic function is moderately reduced. There is moderately elevated pulmonary artery systolic pressure. The tricuspid regurgitant velocity is 2.98 m/s, and with an assumed right atrial pressure of 15 mmHg, the estimated right ventricular systolic pressure is 50.5 mmHg. Left Atrium: Left atrial size was moderately dilated. Right Atrium: Device lead. Right atrial size was severely dilated. Pericardium: There is no evidence of pericardial effusion. Mitral Valve: The mitral valve is normal in structure. Mild to moderate mitral valve regurgitation. Tricuspid Valve: The tricuspid valve is normal in structure. Tricuspid valve regurgitation is moderate. Aortic Valve: The aortic valve is tricuspid. Aortic valve regurgitation is not visualized. Aortic valve mean gradient measures 1.0 mmHg. Aortic valve peak gradient measures 2.0 mmHg. Aortic valve area, by VTI measures 1.44 cm. Pulmonic Valve: The pulmonic valve was not well visualized. Pulmonic valve regurgitation is trivial. Aorta:  The aortic root and ascending aorta are structurally normal, with no evidence of dilitation. Venous: The inferior vena cava is dilated in size with less than 50% respiratory variability, suggesting right atrial pressure of 15 mmHg. IAS/Shunts: The atrial septum is grossly normal. Additional Comments: A device lead is  visualized. There is a moderate pleural effusion.  LEFT VENTRICLE PLAX 2D                        Biplane EF (MOD) LVIDd:         4.40 cm         LV Biplane EF:   Left LVIDs:         4.30 cm                          ventricular LV PW:         1.10 cm                          ejection LV IVS:        0.90 cm                          fraction by LVOT diam:     1.70 cm                          2D MOD LV SV:         12                               biplane is LV SV Index:   8                                10.6 %. LVOT Area:     2.27 cm  LV Volumes (MOD) LV vol d, MOD    137.0 ml A2C: LV vol d, MOD    108.0 ml A4C: LV vol s, MOD    118.0 ml A2C: LV vol s, MOD    95.9 ml A4C: LV SV MOD A2C:   19.0 ml LV SV MOD A4C:   108.0 ml LV SV MOD BP:    13.3 ml RIGHT VENTRICLE RV S prime:     5.98 cm/s TAPSE (M-mode): 0.9 cm LEFT ATRIUM             Index LA diam:        4.90 cm 3.18 cm/m LA Vol (A2C):   57.4 ml 37.24 ml/m LA Vol (A4C):   64.7 ml 41.97 ml/m LA Biplane Vol: 63.5 ml 41.19 ml/m  AORTIC VALVE                    PULMONIC VALVE AV Area (Vmax):    1.28 cm     PV Vmax:          0.80 m/s AV Area (Vmean):   1.35 cm     PV Vmean:         49.500 cm/s AV Area (VTI):     1.44 cm     PV VTI:           0.120 m AV Vmax:           71.30  cm/s   PV Peak grad:     2.6 mmHg AV Vmean:          43.100 cm/s  PV Mean grad:     1.0 mmHg AV VTI:            0.085 m      PR End Diast Vel: 8.53 msec AV Peak Grad:      2.0 mmHg AV Mean Grad:      1.0 mmHg LVOT Vmax:         40.10 cm/s LVOT Vmean:        25.700 cm/s LVOT VTI:          0.054 m LVOT/AV VTI ratio: 0.64  AORTA Ao Root diam: 2.40 cm Ao Asc diam:  2.60 cm MR Peak grad: 88.0 mmHg   TRICUSPID VALVE MR Mean grad: 49.0 mmHg   TR Peak grad:   35.5 mmHg MR Vmax:      469.00 cm/s TR Vmax:        298.00 cm/s MR Vmean:     327.0 cm/s                           SHUNTS                           Systemic VTI:  0.05 m                           Systemic Diam: 1.70 cm Keller Paterson Electronically  signed by Keller Paterson Signature Date/Time: 04/20/2024/4:59:44 PM    Final    CT ABDOMEN PELVIS WO CONTRAST Result Date: 04/20/2024 EXAM: CT ABDOMEN AND PELVIS WITHOUT CONTRAST 04/20/2024 12:57:06 AM TECHNIQUE: CT of the abdomen and pelvis was performed without the administration of intravenous contrast. Multiplanar reformatted images are provided for review. Automated exposure control, iterative reconstruction, and/or weight-based adjustment of the mA/kV was utilized to reduce the radiation dose to as low as reasonably achievable. COMPARISON: 04/18/2020 CLINICAL HISTORY: Abdominal pain and bilious vomiting. FINDINGS: LOWER CHEST: Mild left basilar atelectasis is seen. Large right-sided pleural effusion is noted with right lower lobe consolidation. LIVER: The liver is unremarkable. GALLBLADDER AND BILE DUCTS: The gallbladder has been surgically removed. No biliary ductal dilatation. SPLEEN: No acute abnormality. PANCREAS: No acute abnormality. ADRENAL GLANDS: No acute abnormality. KIDNEYS, URETERS AND BLADDER: No stones in the kidneys or ureters. No hydronephrosis. No perinephric or periureteral stranding. The bladder is partially distended. Air is noted within the bladder, which may be related to recent instrumentation. . No findings to suggest fistulization are seen. GI AND BOWEL: Stomach demonstrates no acute abnormality. Small bowel is within normal limits. No obstructive or inflammatory changes of the colon are seen. Scattered diverticular changes noted without diverticulitis. The appendix is within normal limits. There is no bowel obstruction. PERITONEUM AND RETROPERITONEUM: No free fluid is noted. No free air. VASCULATURE: Aortic calcifications are noted. LYMPH NODES: No lymphadenopathy. REPRODUCTIVE ORGANS: The uterus has been surgically removed. BONES AND SOFT TISSUES: No acute osseous abnormality. No focal soft tissue abnormality. IMPRESSION: 1. No evidence of bowel obstruction. 2. Large right pleural  effusion with right lower lobe consolidation. Electronically signed by: Oneil Devonshire MD 04/20/2024 01:04 AM EST RP Workstation: [id number]   DG Chest 2 View Result Date: 04/19/2024 CLINICAL DATA:  Near syncope EXAM: CHEST - 2 VIEW COMPARISON:  02/23/2024 FINDINGS: Left-sided pacing device  similar in position. Right-sided central venous catheter has been placed, tips overlie the SVC and proximal right atrium. Hypoventilatory changes. Suspected cardiomegaly. Moderate large right pleural effusion with airspace disease at the right base. No pneumothorax IMPRESSION: 1. Moderate to large right pleural effusion with airspace disease at the right base. 2. Cardiomegaly. Electronically Signed   By: Luke Bun M.D.   On: 04/19/2024 22:51    ECHO as above  TELEMETRY (personally reviewed): Ventricular pacing rate 90-100s  EKG (personally reviewed): atrial fibrillation with ventricular pacing rate 120 bpm  Data reviewed by me 05/17/24: last 24h vitals tele labs imaging I/O ED provider note, admission H&P  Principal Problem:   Fluid overload Active Problems:   Acute respiratory failure with hypoxia (HCC)   Recurrent right pleural effusion   Acute on chronic systolic CHF (congestive heart failure) (HCC)   Paroxysmal atrial fibrillation (HCC)   Hyponatremia   Hyperkalemia   Elevated troponin   History of pulmonary embolism   History of cardiac arrest   ESRD on dialysis (HCC)   DM (diabetes mellitus) (HCC)    ASSESSMENT AND PLAN:  Deborah Dawson is a 74 y.o. female  with a past medical history of end-stage heart failure, recurrent pleural effusions, s/p CRT-P 07/2021, ESRD on HD, persistent atrial fibrillation, hypertension, type 2 diabetes who presented to the ED on 04/19/2024 for shortness of breath, nausea and vomiting. Cardiology was consulted for further evaluation.   # Acute on chronic HFrEF, end-stage # Nonischemic cardiomyopathy # S/p CRT-P 07/2021 # Persistent atrial fibrillation #  Recurrent pleural effusion # ESRD on HD Patient presented with complaints of shortness of breath, nausea and vomiting.  Has known end-stage heart failure with a recent hospitalization at [facility], was evaluated for advanced therapies but deemed to not be a candidate.  Chest x-ray this admission with moderate to large right pleural effusion.  Troponins mildly elevated and flat.  BNP >35,000. - Options are limited from a heart failure standpoint as she is essentially end-stage and has been determined to not be a candidate for advanced therapies by [facility].  Consider palliative evaluation for goals of care conversations, patient reported to hospitalist today that she wishes to continue full scope of care. - Recommend continuing with dialysis as per nephrology, appreciate their assistance. - Pulmonology following and is planning for chest tube placement for recurrent pleural effusions, appreciate their assistance. - Continue IV Lasix  40 mg twice daily.  Unclear if patient continues to make any urine. - Continue carvedilol  6.25 mg twice daily and hydralazine  50 mg every 8 hours.  Will make further adjustments to GDMT as able throughout admission. - Mild and flat troponin elevation most consistent with demand/supply mismatch and not ACS.    This patient's plan of care was discussed and created with Dr. Wilburn and he is in agreement.  Signed: Danita Bloch, PA-C  05-17-2024, 9:20 AM [facility] Cardiology      "

## 2024-04-24 NOTE — ED Notes (Addendum)
Husband at bedside with chaplain.

## 2024-04-24 NOTE — Inpatient Diabetes Management (Signed)
 Inpatient Diabetes Program Recommendations  AACE/ADA: New Consensus Statement on Inpatient Glycemic Control (2015)  Target Ranges:  Prepandial:   less than 140 mg/dL      Peak postprandial:   less than 180 mg/dL (1-2 hours)      Critically ill patients:  140 - 180 mg/dL    Latest Reference Range & Units 01/24/24 10:05  Hemoglobin A1C 4.8 - 5.6 % 8.0 (H)  (H): Data is abnormally high  Latest Reference Range & Units 04/20/24 18:12 04/20/24 21:31 04/20/24 23:18 2024/04/24 04:00 Apr 24, 2024 06:09  Glucose-Capillary 70 - 99 mg/dL 44 (LL)  50ml I49% 69 (L) 74 64 (L)  Given Juice and Crackers 105 (H)    Admit with: Fall/presyncope, generalized weakness/ Acute hypoxemic respiratory failure likely due to fluid overload/acute congestive heart failure exacerbation  History: DM2, ESRD, CHF  Home DM Meds: Novolog  0-6 units TID (NOT taking)       Lantus  20 units daily in the PM  Current Orders: CBG checks Q2H    Received dialysis treatment last PM  Checking CBGs Q2H for now  Will follow while admitted    --Will follow patient during hospitalization--  Adina Rudolpho Arrow RN, MSN, CDCES Diabetes Coordinator Inpatient Glycemic Control Team Team Pager: [phone] (8a-5p)

## 2024-04-24 NOTE — ED Notes (Signed)
 Xray called for STAT bedside xray

## 2024-04-24 NOTE — ED Notes (Addendum)
 Femoral pulse no longer palpable. Some cardiac activity noted on US  by EDP

## 2024-04-24 NOTE — ED Notes (Signed)
 Pt HR decreasing. Pulses fainter. EDP remains at bedside for US  assessment

## 2024-04-24 NOTE — Death Summary Note (Signed)
 "  DEATH SUMMARY   Patient Details  Name: Deborah Dawson MRN: [id number] DOB: 03/31/1950 ERE:Yjwiz, Tamra, MD Admission/Discharge Information   Admit Date:  05/07/2024  Date of Death: Date of Death: (S) 05/09/24  Time of Death: Time of Death: (S) 05/14/10  Length of Stay: 1   Principle Cause of death: cardiac arrest  Hospital Diagnoses: Principal Problem:   Fluid overload Active Problems:   Acute on chronic systolic CHF (congestive heart failure) (HCC)   Recurrent right pleural effusion   Acute respiratory failure with hypoxia (HCC)   Paroxysmal atrial fibrillation (HCC)   Hyponatremia   Hyperkalemia   Elevated troponin   History of pulmonary embolism   History of cardiac arrest   ESRD on dialysis (HCC)   DM (diabetes mellitus) (HCC)  History end-stage CHF (EF less than 15%) secondary to non-ischemic cardiomyopathy, recently started on dialysis for ESRD, also recent hospitalization for recurrent pleural effusion, presenting here with dyspnea and nausea/vomiting and found to have significant recurrent pleural effusion in the setting of missed dialysis session. Nausea/vomiting resolved and w/u of that (labs, CT) negative. Nephrology performed dialysis the day of patient's admission. On hospital day one patient suffered cardiac arrest (witnessed by her husband). ED team responded immediately and patient received chest compressions, epinephrine , calcium , and bicarbonate. Rhythm not shockable. ROSC obtained. Husband elected DNR/DNI and a short while later patient suffered another cardiac arrest and was pronounced at 11:12 AM.        Procedures: CPR, hemodialysis  Consultations: cardiology, nephrology  The results of significant diagnostics from this hospitalization (including imaging, microbiology, ancillary and laboratory) are listed below for reference.   Significant Diagnostic Studies: ECHOCARDIOGRAM COMPLETE Result Date: 04/20/2024    ECHOCARDIOGRAM REPORT   Patient Name:    Deborah Dawson Beaston Date of Exam: 04/20/2024 Medical Rec #:  [id number]    Height:       61.0 in Accession #:    [id number]   Weight:       124.0 lb Date of Birth:  1950/10/10    BSA:          1.541 m Patient Age:    74 years     BP:           116/89 mmHg Patient Gender: F            HR:           102 bpm. Exam Location:  [facility] Procedure: 2D Echo (Both Spectral and Color Flow Doppler were utilized during            procedure). Indications:     Congestive Heart Failure  History:         Patient has prior history of Echocardiogram examinations. CHF,                  Angina and CAD, Pacemaker, Arrythmias:Atrial Fibrillation,                  Signs/Symptoms:Chest Pain and Hypertensive Heart Disease; Risk                  Factors:Hypertension.  Sonographer:     Rainelle Gull Referring Phys:  [id number] [facility] PAUDEL Diagnosing Phys: Keller Paterson IMPRESSIONS  1. Left ventricular ejection fraction, by estimation, is <20%. Left ventricular ejection fraction by 2D MOD biplane is 10.6 %. The left ventricle has severely decreased function. The left ventricle demonstrates regional wall motion abnormalities (see scoring diagram/findings for description). The left ventricular internal cavity size  was mildly dilated. There is mild left ventricular hypertrophy. Left ventricular diastolic parameters are consistent with Grade II diastolic dysfunction (pseudonormalization).  2. Right ventricular systolic function is moderately reduced. The right ventricular size is normal. There is moderately elevated pulmonary artery systolic pressure. The estimated right ventricular systolic pressure is 50.5 mmHg.  3. Left atrial size was moderately dilated.  4. Right atrial size was severely dilated.  5. Moderate pleural effusion.  6. The mitral valve is normal in structure. Mild to moderate mitral valve regurgitation.  7. Tricuspid valve regurgitation is moderate.  8. The aortic valve is tricuspid. Aortic valve regurgitation is not visualized.  9. The inferior  vena cava is dilated in size with <50% respiratory variability, suggesting right atrial pressure of 15 mmHg. FINDINGS  Left Ventricle: Left ventricular ejection fraction, by estimation, is <20%. Left ventricular ejection fraction by 2D MOD biplane is 10.6 %. The left ventricle has severely decreased function. The left ventricle demonstrates regional wall motion abnormalities. The left ventricular internal cavity size was mildly dilated. There is mild left ventricular hypertrophy. Left ventricular diastolic parameters are consistent with Grade II diastolic dysfunction (pseudonormalization).  LV Wall Scoring: The mid and distal anterior septum, entire apex, basal inferolateral segment, and basal inferior segment are akinetic. The anterior wall, antero-lateral wall, basal anteroseptal segment, mid inferolateral segment, mid inferoseptal segment, mid inferior segment, and basal inferoseptal segment are hypokinetic. Right Ventricle: The right ventricular size is normal. No increase in right ventricular wall thickness. Right ventricular systolic function is moderately reduced. There is moderately elevated pulmonary artery systolic pressure. The tricuspid regurgitant velocity is 2.98 m/s, and with an assumed right atrial pressure of 15 mmHg, the estimated right ventricular systolic pressure is 50.5 mmHg. Left Atrium: Left atrial size was moderately dilated. Right Atrium: Device lead. Right atrial size was severely dilated. Pericardium: There is no evidence of pericardial effusion. Mitral Valve: The mitral valve is normal in structure. Mild to moderate mitral valve regurgitation. Tricuspid Valve: The tricuspid valve is normal in structure. Tricuspid valve regurgitation is moderate. Aortic Valve: The aortic valve is tricuspid. Aortic valve regurgitation is not visualized. Aortic valve mean gradient measures 1.0 mmHg. Aortic valve peak gradient measures 2.0 mmHg. Aortic valve area, by VTI measures 1.44 cm. Pulmonic Valve:  The pulmonic valve was not well visualized. Pulmonic valve regurgitation is trivial. Aorta: The aortic root and ascending aorta are structurally normal, with no evidence of dilitation. Venous: The inferior vena cava is dilated in size with less than 50% respiratory variability, suggesting right atrial pressure of 15 mmHg. IAS/Shunts: The atrial septum is grossly normal. Additional Comments: A device lead is visualized. There is a moderate pleural effusion.  LEFT VENTRICLE PLAX 2D                        Biplane EF (MOD) LVIDd:         4.40 cm         LV Biplane EF:   Left LVIDs:         4.30 cm                          ventricular LV PW:         1.10 cm                          ejection LV IVS:        0.90 cm  fraction by LVOT diam:     1.70 cm                          2D MOD LV SV:         12                               biplane is LV SV Index:   8                                10.6 %. LVOT Area:     2.27 cm  LV Volumes (MOD) LV vol d, MOD    137.0 ml A2C: LV vol d, MOD    108.0 ml A4C: LV vol s, MOD    118.0 ml A2C: LV vol s, MOD    95.9 ml A4C: LV SV MOD A2C:   19.0 ml LV SV MOD A4C:   108.0 ml LV SV MOD BP:    13.3 ml RIGHT VENTRICLE RV S prime:     5.98 cm/s TAPSE (M-mode): 0.9 cm LEFT ATRIUM             Index LA diam:        4.90 cm 3.18 cm/m LA Vol (A2C):   57.4 ml 37.24 ml/m LA Vol (A4C):   64.7 ml 41.97 ml/m LA Biplane Vol: 63.5 ml 41.19 ml/m  AORTIC VALVE                    PULMONIC VALVE AV Area (Vmax):    1.28 cm     PV Vmax:          0.80 m/s AV Area (Vmean):   1.35 cm     PV Vmean:         49.500 cm/s AV Area (VTI):     1.44 cm     PV VTI:           0.120 m AV Vmax:           71.30 cm/s   PV Peak grad:     2.6 mmHg AV Vmean:          43.100 cm/s  PV Mean grad:     1.0 mmHg AV VTI:            0.085 m      PR End Diast Vel: 8.53 msec AV Peak Grad:      2.0 mmHg AV Mean Grad:      1.0 mmHg LVOT Vmax:         40.10 cm/s LVOT Vmean:        25.700 cm/s LVOT VTI:           0.054 m LVOT/AV VTI ratio: 0.64  AORTA Ao Root diam: 2.40 cm Ao Asc diam:  2.60 cm MR Peak grad: 88.0 mmHg   TRICUSPID VALVE MR Mean grad: 49.0 mmHg   TR Peak grad:   35.5 mmHg MR Vmax:      469.00 cm/s TR Vmax:        298.00 cm/s MR Vmean:     327.0 cm/s                           SHUNTS  Systemic VTI:  0.05 m                           Systemic Diam: 1.70 cm Keller Paterson Electronically signed by Keller Paterson Signature Date/Time: 04/20/2024/4:59:44 PM    Final    CT ABDOMEN PELVIS WO CONTRAST Result Date: 04/20/2024 EXAM: CT ABDOMEN AND PELVIS WITHOUT CONTRAST 04/20/2024 12:57:06 AM TECHNIQUE: CT of the abdomen and pelvis was performed without the administration of intravenous contrast. Multiplanar reformatted images are provided for review. Automated exposure control, iterative reconstruction, and/or weight-based adjustment of the mA/kV was utilized to reduce the radiation dose to as low as reasonably achievable. COMPARISON: 04/18/2020 CLINICAL HISTORY: Abdominal pain and bilious vomiting. FINDINGS: LOWER CHEST: Mild left basilar atelectasis is seen. Large right-sided pleural effusion is noted with right lower lobe consolidation. LIVER: The liver is unremarkable. GALLBLADDER AND BILE DUCTS: The gallbladder has been surgically removed. No biliary ductal dilatation. SPLEEN: No acute abnormality. PANCREAS: No acute abnormality. ADRENAL GLANDS: No acute abnormality. KIDNEYS, URETERS AND BLADDER: No stones in the kidneys or ureters. No hydronephrosis. No perinephric or periureteral stranding. The bladder is partially distended. Air is noted within the bladder, which may be related to recent instrumentation. . No findings to suggest fistulization are seen. GI AND BOWEL: Stomach demonstrates no acute abnormality. Small bowel is within normal limits. No obstructive or inflammatory changes of the colon are seen. Scattered diverticular changes noted without diverticulitis. The appendix is within  normal limits. There is no bowel obstruction. PERITONEUM AND RETROPERITONEUM: No free fluid is noted. No free air. VASCULATURE: Aortic calcifications are noted. LYMPH NODES: No lymphadenopathy. REPRODUCTIVE ORGANS: The uterus has been surgically removed. BONES AND SOFT TISSUES: No acute osseous abnormality. No focal soft tissue abnormality. IMPRESSION: 1. No evidence of bowel obstruction. 2. Large right pleural effusion with right lower lobe consolidation. Electronically signed by: Oneil Devonshire MD 04/20/2024 01:04 AM EST RP Workstation: [id number]   DG Chest 2 View Result Date: 04/19/2024 CLINICAL DATA:  Near syncope EXAM: CHEST - 2 VIEW COMPARISON:  02/23/2024 FINDINGS: Left-sided pacing device similar in position. Right-sided central venous catheter has been placed, tips overlie the SVC and proximal right atrium. Hypoventilatory changes. Suspected cardiomegaly. Moderate large right pleural effusion with airspace disease at the right base. No pneumothorax IMPRESSION: 1. Moderate to large right pleural effusion with airspace disease at the right base. 2. Cardiomegaly. Electronically Signed   By: Luke Bun M.D.   On: 04/19/2024 22:51    Microbiology: No results found for this or any previous visit (from the past 240 hours).  Time spent: 35 minutes  Signed: Devaughn KATHEE Ban, MD 2024-05-19   "

## 2024-04-24 NOTE — ED Notes (Signed)
 Pt was able to successfully ambulate to the toilet and back w/ the assistance of a walker and 1 person.

## 2024-04-24 NOTE — Progress Notes (Signed)
 " [facility]  ROUNDING NOTE   Subjective:   Deborah Dawson is a 74 y.o. 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 Patient presents to ED with vomiting, syncope and fall. She is admitted for Hyperkalemia [E87.5] Pleural effusion [J90] Fluid overload [E87.70] Hypervolemia, unspecified hypervolemia type [E87.70] Dyspnea, unspecified type [R06.00] Nausea and vomiting, unspecified vomiting type [R11.2]  Patient is known to our practice and receives outpatient dialysis treatments at [facility] on a MWF schedule.    Patient seen resting on stretcher Appears well, no complaints He is on 4 L nasal cannula, denies shortness of breath   Objective:  Vital signs in last 24 hours:  Temp:  [97.5 F (36.4 C)-98 F (36.7 C)] 98 F (36.7 C) 2024/05/17 0700) Pulse Rate:  [75-162] 117 05-17-24 1105) Resp:  [8-32] 20 17-May-2024 1109) BP: (55-169)/(43-125) 55/43 05-17-24 1109) SpO2:  [94 %-100 %] 100 % 05-17-24 1105) FiO2 (%):  [100 %] 100 % 2024/05/17 1100) Weight:  [54.5 kg-56.2 kg] 54.5 kg (01/28 1830)  Weight change: -0.046 kg Filed Weights   04/19/24 2217 04/20/24 1608 04/20/24 1830  Weight: 56.2 kg 56.2 kg 54.5 kg    Intake/Output: I/O last 3 completed shifts: In: 11.5 [I.V.:11.5] Out: 1700 [Other:1700]   Intake/Output this shift:  Total I/O In: 37.2 [I.V.:37.2] Out: -   Physical Exam: General: NAD  Head: Normocephalic, atraumatic. Moist oral mucosal membranes  Eyes: Anicteric  Lungs:  Diminished  Heart: Regular rate and rhythm  Abdomen:  Soft, nontender  Extremities: 2+ peripheral edema.  Neurologic: Awake, alert, conversant  Skin: Warm,dry, no rash  Access: Right IJ PermCath    Basic Metabolic Panel: Recent Labs  Lab 04/19/24 2224 04/20/24 1425 17-May-2024 0826  NA 132* 134* 131*  K 5.7* 6.0* 4.6  CL 93* 96* 94*  CO2  23 24 24   GLUCOSE 131* 43* 88  BUN 37* 43* 27*  CREATININE 3.05* 3.41* 2.59*  CALCIUM  9.3 9.2 8.8*  PHOS  --  5.4*  --     Liver Function Tests: Recent Labs  Lab 04/20/24 0019 04/20/24 1425 17-May-2024 0826  AST 33  --  69*  ALT 30  --  45*  ALKPHOS 79  --  92  BILITOT 0.7  --  0.9  PROT 7.1  --  7.1  ALBUMIN 3.6 3.5 3.4*   Recent Labs  Lab 04/20/24 0019  LIPASE 35   No results for input(s): AMMONIA in the last 168 hours.  CBC: Recent Labs  Lab 04/19/24 2224 04/20/24 1625 05-17-24 0826  WBC 8.3 7.2 6.7  HGB 11.0* 10.5* 10.7*  HCT 33.6* 32.4* 32.5*  MCV 91.8 90.0 90.8  PLT 227 228 229    Cardiac Enzymes: No results for input(s): CKTOTAL, CKMB, CKMBINDEX, TROPONINI in the last 168 hours.  BNP: Invalid input(s): POCBNP  CBG: Recent Labs  Lab 04/20/24 2318 May 17, 2024 0400 17-May-2024 0609 05/17/2024 0748 2024-05-17 1030  GLUCAP 74 64* 105* 66* 142*    Microbiology: Results for orders placed or performed during the hospital encounter of 02/09/24  Blood culture (routine x 2)     Status: None   Collection Time: 02/09/24  9:27 PM   Specimen: BLOOD  Result Value Ref Range Status   Specimen Description BLOOD BLOOD RIGHT ARM  Final   Special Requests   Final    BOTTLES DRAWN  AEROBIC AND ANAEROBIC Blood Culture results may not be optimal due to an inadequate volume of blood received in culture bottles   Culture   Final    NO GROWTH 5 DAYS Performed at [facility], [address] [place], [place] [address]    Report Status 02/14/2024 FINAL  Final  Blood culture (routine x 2)     Status: None   Collection Time: 02/09/24  9:27 PM   Specimen: BLOOD  Result Value Ref Range Status   Specimen Description BLOOD BLOOD LEFT ARM  Final   Special Requests   Final    BOTTLES DRAWN AEROBIC AND ANAEROBIC Blood Culture results may not be optimal due to an inadequate volume of blood received in culture bottles   Culture   Final    NO GROWTH 5  DAYS Performed at [facility], [address] [place], [place] [address]    Report Status 02/14/2024 FINAL  Final  MRSA Next Gen by PCR, Nasal     Status: None   Collection Time: 02/10/24  1:49 AM   Specimen: Nasal Mucosa; Nasal Swab  Result Value Ref Range Status   MRSA by PCR Next Gen NOT DETECTED NOT DETECTED Final    Comment: (NOTE) The GeneXpert MRSA Assay (FDA approved for NASAL specimens only), is one component of a comprehensive MRSA colonization surveillance program. It is not intended to diagnose MRSA infection nor to guide or monitor treatment for MRSA infections. Test performance is not FDA approved in patients less than 7 years old. Performed at [facility], [address] [place], [place] [address]   Respiratory (~20 pathogens) panel by PCR     Status: Abnormal   Collection Time: 02/10/24  1:49 AM   Specimen: Nasopharyngeal Swab; Respiratory  Result Value Ref Range Status   Adenovirus NOT DETECTED NOT DETECTED Final   Coronavirus 229E NOT DETECTED NOT DETECTED Final    Comment: (NOTE) The Coronavirus on the Respiratory Panel, DOES NOT test for the novel  Coronavirus (2019 nCoV)    Coronavirus HKU1 NOT DETECTED NOT DETECTED Final   Coronavirus NL63 NOT DETECTED NOT DETECTED Final   Coronavirus OC43 NOT DETECTED NOT DETECTED Final   Metapneumovirus NOT DETECTED NOT DETECTED Final   Rhinovirus / Enterovirus NOT DETECTED NOT DETECTED Final   Influenza A NOT DETECTED NOT DETECTED Final   Influenza B NOT DETECTED NOT DETECTED Final   Parainfluenza Virus 1 NOT DETECTED NOT DETECTED Final   Parainfluenza Virus 2 NOT DETECTED NOT DETECTED Final   Parainfluenza Virus 3 DETECTED (A) NOT DETECTED Final   Parainfluenza Virus 4 NOT DETECTED NOT DETECTED Final   Respiratory Syncytial Virus NOT DETECTED NOT DETECTED Final   Bordetella pertussis NOT DETECTED NOT DETECTED Final   Bordetella Parapertussis NOT DETECTED NOT DETECTED Final   Chlamydophila  pneumoniae NOT DETECTED NOT DETECTED Final   Mycoplasma pneumoniae NOT DETECTED NOT DETECTED Final    Comment: Performed at [facility], [address] [place], [place] [address]  Resp panel by RT-PCR (RSV, Flu A&B, Covid) Anterior Nasal Swab     Status: None   Collection Time: 02/10/24  3:17 AM   Specimen: Anterior Nasal Swab  Result Value Ref Range Status   SARS Coronavirus 2 by RT PCR NEGATIVE NEGATIVE Final    Comment: (NOTE) SARS-CoV-2 target nucleic acids are NOT DETECTED.  The SARS-CoV-2 RNA is generally detectable in upper respiratory specimens during the acute phase of infection. The lowest concentration of SARS-CoV-2 viral  copies this assay can detect is 138 copies/mL. A negative result does not preclude SARS-Cov-2 infection and should not be used as the sole basis for treatment or other patient management decisions. A negative result may occur with  improper specimen collection/handling, submission of specimen other than nasopharyngeal swab, presence of viral mutation(s) within the areas targeted by this assay, and inadequate number of viral copies(<138 copies/mL). A negative result must be combined with clinical observations, patient history, and epidemiological information. The expected result is Negative.  Fact Sheet for Patients:  bloggercourse.com  Fact Sheet for Healthcare Providers:  seriousbroker.it  This test is no t yet approved or cleared by the [place]  FDA and  has been authorized for detection and/or diagnosis of SARS-CoV-2 by FDA under an Emergency Use Authorization (EUA). This EUA will remain  in effect (meaning this test can be used) for the duration of the COVID-19 declaration under Section 564(b)(1) of the Act, 21 U.S.C.section 360bbb-3(b)(1), unless the authorization is terminated  or revoked sooner.       Influenza A by PCR NEGATIVE NEGATIVE Final   Influenza B by PCR NEGATIVE  NEGATIVE Final    Comment: (NOTE) The Xpert Xpress SARS-CoV-2/FLU/RSV plus assay is intended as an aid in the diagnosis of influenza from Nasopharyngeal swab specimens and should not be used as a sole basis for treatment. Nasal washings and aspirates are unacceptable for Xpert Xpress SARS-CoV-2/FLU/RSV testing.  Fact Sheet for Patients: bloggercourse.com  Fact Sheet for Healthcare Providers: seriousbroker.it  This test is not yet approved or cleared by the [place]  FDA and has been authorized for detection and/or diagnosis of SARS-CoV-2 by FDA under an Emergency Use Authorization (EUA). This EUA will remain in effect (meaning this test can be used) for the duration of the COVID-19 declaration under Section 564(b)(1) of the Act, 21 U.S.C. section 360bbb-3(b)(1), unless the authorization is terminated or revoked.     Resp Syncytial Virus by PCR NEGATIVE NEGATIVE Final    Comment: (NOTE) Fact Sheet for Patients: bloggercourse.com  Fact Sheet for Healthcare Providers: seriousbroker.it  This test is not yet approved or cleared by the [place]  FDA and has been authorized for detection and/or diagnosis of SARS-CoV-2 by FDA under an Emergency Use Authorization (EUA). This EUA will remain in effect (meaning this test can be used) for the duration of the COVID-19 declaration under Section 564(b)(1) of the Act, 21 U.S.C. section 360bbb-3(b)(1), unless the authorization is terminated or revoked.  Performed at [facility], [address] [place], [place] [address]   Culture, Respiratory w Gram Stain     Status: None   Collection Time: 02/10/24  7:56 AM   Specimen: Tracheal Aspirate; Respiratory  Result Value Ref Range Status   Specimen Description   Final    TRACHEAL ASPIRATE Performed at [facility], [address] [place], [place] [address]     Special Requests   Final    NONE Performed at [facility], [address] [place], [place] [address]    Gram Stain   Final    FEW WBC PRESENT,BOTH PMN AND MONONUCLEAR NO ORGANISMS SEEN    Culture   Final    NO GROWTH 2 DAYS Performed at [facility], [address] [place], [place] [address]    Report Status 02/12/2024 FINAL  Final  Body fluid culture w Gram Stain     Status: None   Collection Time: 02/11/24 12:25 PM   Specimen: Pleura; Body Fluid  Result  Value Ref Range Status   Specimen Description   Final    PLEURAL Performed at [facility], [address] [place], [place] [address]    Special Requests   Final    NONE Performed at [facility], [address] [place], [place] [address]    Gram Stain   Final    WBC PRESENT, PREDOMINANTLY MONONUCLEAR NO ORGANISMS SEEN    Culture   Final    NO GROWTH 3 DAYS Performed at [facility], [address] [place], [place] [address]    Report Status 02/15/2024 FINAL  Final  Resp panel by RT-PCR (RSV, Flu A&B, Covid) Anterior Nasal Swab     Status: None   Collection Time: 02/17/24  6:19 PM   Specimen: Anterior Nasal Swab  Result Value Ref Range Status   SARS Coronavirus 2 by RT PCR NEGATIVE NEGATIVE Final    Comment: (NOTE) SARS-CoV-2 target nucleic acids are NOT DETECTED.  The SARS-CoV-2 RNA is generally detectable in upper respiratory specimens during the acute phase of infection. The lowest concentration of SARS-CoV-2 viral copies this assay can detect is 138 copies/mL. A negative result does not preclude SARS-Cov-2 infection and should not be used as the sole basis for treatment or other patient management decisions. A negative result may occur with  improper specimen collection/handling, submission of specimen other than nasopharyngeal swab, presence of viral mutation(s) within the areas targeted by this assay, and inadequate number of viral copies(<138 copies/mL). A  negative result must be combined with clinical observations, patient history, and epidemiological information. The expected result is Negative.  Fact Sheet for Patients:  bloggercourse.com  Fact Sheet for Healthcare Providers:  seriousbroker.it  This test is no t yet approved or cleared by the [place]  FDA and  has been authorized for detection and/or diagnosis of SARS-CoV-2 by FDA under an Emergency Use Authorization (EUA). This EUA will remain  in effect (meaning this test can be used) for the duration of the COVID-19 declaration under Section 564(b)(1) of the Act, 21 U.S.C.section 360bbb-3(b)(1), unless the authorization is terminated  or revoked sooner.       Influenza A by PCR NEGATIVE NEGATIVE Final   Influenza B by PCR NEGATIVE NEGATIVE Final    Comment: (NOTE) The Xpert Xpress SARS-CoV-2/FLU/RSV plus assay is intended as an aid in the diagnosis of influenza from Nasopharyngeal swab specimens and should not be used as a sole basis for treatment. Nasal washings and aspirates are unacceptable for Xpert Xpress SARS-CoV-2/FLU/RSV testing.  Fact Sheet for Patients: bloggercourse.com  Fact Sheet for Healthcare Providers: seriousbroker.it  This test is not yet approved or cleared by the [place]  FDA and has been authorized for detection and/or diagnosis of SARS-CoV-2 by FDA under an Emergency Use Authorization (EUA). This EUA will remain in effect (meaning this test can be used) for the duration of the COVID-19 declaration under Section 564(b)(1) of the Act, 21 U.S.C. section 360bbb-3(b)(1), unless the authorization is terminated or revoked.     Resp Syncytial Virus by PCR NEGATIVE NEGATIVE Final    Comment: (NOTE) Fact Sheet for Patients: bloggercourse.com  Fact Sheet for Healthcare  Providers: seriousbroker.it  This test is not yet approved or cleared by the [place]  FDA and has been authorized for detection and/or diagnosis of SARS-CoV-2 by FDA under an Emergency Use Authorization (EUA). This EUA will remain in effect (meaning this test can be used) for the duration of the COVID-19 declaration under Section 564(b)(1) of the Act,  21 U.S.C. section 360bbb-3(b)(1), unless the authorization is terminated or revoked.  Performed at [facility], [address] [place], [place] [address]     Coagulation Studies: Recent Labs    04/19/24 05/03/2222 04/20/24 1326  LABPROT 20.4* 19.1*  INR 1.6* 1.5*    Urinalysis: No results for input(s): COLORURINE, LABSPEC, PHURINE, GLUCOSEU, HGBUR, BILIRUBINUR, KETONESUR, PROTEINUR, UROBILINOGEN, NITRITE, LEUKOCYTESUR in the last 72 hours.  Invalid input(s): APPERANCEUR    Imaging: ECHOCARDIOGRAM COMPLETE Result Date: 04/20/2024    ECHOCARDIOGRAM REPORT   Patient Name:   KEHAULANI FRUIN Seeberger Date of Exam: 04/20/2024 Medical Rec #:  [id number]    Height:       61.0 in Accession #:    [id number]   Weight:       124.0 lb Date of Birth:  1951/01/17    BSA:          1.541 m Patient Age:    73 years     BP:           116/89 mmHg Patient Gender: F            HR:           102 bpm. Exam Location:  [facility] Procedure: 2D Echo (Both Spectral and Color Flow Doppler were utilized during            procedure). Indications:     Congestive Heart Failure  History:         Patient has prior history of Echocardiogram examinations. CHF,                  Angina and CAD, Pacemaker, Arrythmias:Atrial Fibrillation,                  Signs/Symptoms:Chest Pain and Hypertensive Heart Disease; Risk                  Factors:Hypertension.  Sonographer:     Rainelle Gull Referring Phys:  [id number] [facility] PAUDEL Diagnosing Phys: Keller Paterson IMPRESSIONS  1. Left ventricular ejection fraction, by estimation, is <20%. Left  ventricular ejection fraction by 2D MOD biplane is 10.6 %. The left ventricle has severely decreased function. The left ventricle demonstrates regional wall motion abnormalities (see scoring diagram/findings for description). The left ventricular internal cavity size was mildly dilated. There is mild left ventricular hypertrophy. Left ventricular diastolic parameters are consistent with Grade II diastolic dysfunction (pseudonormalization).  2. Right ventricular systolic function is moderately reduced. The right ventricular size is normal. There is moderately elevated pulmonary artery systolic pressure. The estimated right ventricular systolic pressure is 50.5 mmHg.  3. Left atrial size was moderately dilated.  4. Right atrial size was severely dilated.  5. Moderate pleural effusion.  6. The mitral valve is normal in structure. Mild to moderate mitral valve regurgitation.  7. Tricuspid valve regurgitation is moderate.  8. The aortic valve is tricuspid. Aortic valve regurgitation is not visualized.  9. The inferior vena cava is dilated in size with <50% respiratory variability, suggesting right atrial pressure of 15 mmHg. FINDINGS  Left Ventricle: Left ventricular ejection fraction, by estimation, is <20%. Left ventricular ejection fraction by 2D MOD biplane is 10.6 %. The left ventricle has severely decreased function. The left ventricle demonstrates regional wall motion abnormalities. The left ventricular internal cavity size was mildly dilated. There is mild left ventricular hypertrophy. Left ventricular diastolic parameters are consistent with Grade II diastolic dysfunction (pseudonormalization).  LV Wall Scoring: The mid and distal anterior septum, entire apex,  basal inferolateral segment, and basal inferior segment are akinetic. The anterior wall, antero-lateral wall, basal anteroseptal segment, mid inferolateral segment, mid inferoseptal segment, mid inferior segment, and basal inferoseptal segment are  hypokinetic. Right Ventricle: The right ventricular size is normal. No increase in right ventricular wall thickness. Right ventricular systolic function is moderately reduced. There is moderately elevated pulmonary artery systolic pressure. The tricuspid regurgitant velocity is 2.98 m/s, and with an assumed right atrial pressure of 15 mmHg, the estimated right ventricular systolic pressure is 50.5 mmHg. Left Atrium: Left atrial size was moderately dilated. Right Atrium: Device lead. Right atrial size was severely dilated. Pericardium: There is no evidence of pericardial effusion. Mitral Valve: The mitral valve is normal in structure. Mild to moderate mitral valve regurgitation. Tricuspid Valve: The tricuspid valve is normal in structure. Tricuspid valve regurgitation is moderate. Aortic Valve: The aortic valve is tricuspid. Aortic valve regurgitation is not visualized. Aortic valve mean gradient measures 1.0 mmHg. Aortic valve peak gradient measures 2.0 mmHg. Aortic valve area, by VTI measures 1.44 cm. Pulmonic Valve: The pulmonic valve was not well visualized. Pulmonic valve regurgitation is trivial. Aorta: The aortic root and ascending aorta are structurally normal, with no evidence of dilitation. Venous: The inferior vena cava is dilated in size with less than 50% respiratory variability, suggesting right atrial pressure of 15 mmHg. IAS/Shunts: The atrial septum is grossly normal. Additional Comments: A device lead is visualized. There is a moderate pleural effusion.  LEFT VENTRICLE PLAX 2D                        Biplane EF (MOD) LVIDd:         4.40 cm         LV Biplane EF:   Left LVIDs:         4.30 cm                          ventricular LV PW:         1.10 cm                          ejection LV IVS:        0.90 cm                          fraction by LVOT diam:     1.70 cm                          2D MOD LV SV:         12                               biplane is LV SV Index:   8                                 10.6 %. LVOT Area:     2.27 cm  LV Volumes (MOD) LV vol d, MOD    137.0 ml A2C: LV vol d, MOD    108.0 ml A4C: LV vol s, MOD    118.0 ml A2C: LV vol s, MOD    95.9 ml A4C: LV SV MOD A2C:   19.0 ml LV SV MOD  A4C:   108.0 ml LV SV MOD BP:    13.3 ml RIGHT VENTRICLE RV S prime:     5.98 cm/s TAPSE (M-mode): 0.9 cm LEFT ATRIUM             Index LA diam:        4.90 cm 3.18 cm/m LA Vol (A2C):   57.4 ml 37.24 ml/m LA Vol (A4C):   64.7 ml 41.97 ml/m LA Biplane Vol: 63.5 ml 41.19 ml/m  AORTIC VALVE                    PULMONIC VALVE AV Area (Vmax):    1.28 cm     PV Vmax:          0.80 m/s AV Area (Vmean):   1.35 cm     PV Vmean:         49.500 cm/s AV Area (VTI):     1.44 cm     PV VTI:           0.120 m AV Vmax:           71.30 cm/s   PV Peak grad:     2.6 mmHg AV Vmean:          43.100 cm/s  PV Mean grad:     1.0 mmHg AV VTI:            0.085 m      PR End Diast Vel: 8.53 msec AV Peak Grad:      2.0 mmHg AV Mean Grad:      1.0 mmHg LVOT Vmax:         40.10 cm/s LVOT Vmean:        25.700 cm/s LVOT VTI:          0.054 m LVOT/AV VTI ratio: 0.64  AORTA Ao Root diam: 2.40 cm Ao Asc diam:  2.60 cm MR Peak grad: 88.0 mmHg   TRICUSPID VALVE MR Mean grad: 49.0 mmHg   TR Peak grad:   35.5 mmHg MR Vmax:      469.00 cm/s TR Vmax:        298.00 cm/s MR Vmean:     327.0 cm/s                           SHUNTS                           Systemic VTI:  0.05 m                           Systemic Diam: 1.70 cm Keller Paterson Electronically signed by Keller Paterson Signature Date/Time: 04/20/2024/4:59:44 PM    Final    CT ABDOMEN PELVIS WO CONTRAST Result Date: 04/20/2024 EXAM: CT ABDOMEN AND PELVIS WITHOUT CONTRAST 04/20/2024 12:57:06 AM TECHNIQUE: CT of the abdomen and pelvis was performed without the administration of intravenous contrast. Multiplanar reformatted images are provided for review. Automated exposure control, iterative reconstruction, and/or weight-based adjustment of the mA/kV was utilized to reduce the radiation  dose to as low as reasonably achievable. COMPARISON: 04/18/2020 CLINICAL HISTORY: Abdominal pain and bilious vomiting. FINDINGS: LOWER CHEST: Mild left basilar atelectasis is seen. Large right-sided pleural effusion is noted with right lower lobe consolidation. LIVER: The liver is unremarkable. GALLBLADDER AND BILE DUCTS: The gallbladder has been surgically removed. No biliary ductal dilatation. SPLEEN: No  acute abnormality. PANCREAS: No acute abnormality. ADRENAL GLANDS: No acute abnormality. KIDNEYS, URETERS AND BLADDER: No stones in the kidneys or ureters. No hydronephrosis. No perinephric or periureteral stranding. The bladder is partially distended. Air is noted within the bladder, which may be related to recent instrumentation. . No findings to suggest fistulization are seen. GI AND BOWEL: Stomach demonstrates no acute abnormality. Small bowel is within normal limits. No obstructive or inflammatory changes of the colon are seen. Scattered diverticular changes noted without diverticulitis. The appendix is within normal limits. There is no bowel obstruction. PERITONEUM AND RETROPERITONEUM: No free fluid is noted. No free air. VASCULATURE: Aortic calcifications are noted. LYMPH NODES: No lymphadenopathy. REPRODUCTIVE ORGANS: The uterus has been surgically removed. BONES AND SOFT TISSUES: No acute osseous abnormality. No focal soft tissue abnormality. IMPRESSION: 1. No evidence of bowel obstruction. 2. Large right pleural effusion with right lower lobe consolidation. Electronically signed by: Oneil Devonshire MD 04/20/2024 01:04 AM EST RP Workstation: [id number]   DG Chest 2 View Result Date: 04/19/2024 CLINICAL DATA:  Near syncope EXAM: CHEST - 2 VIEW COMPARISON:  02/23/2024 FINDINGS: Left-sided pacing device similar in position. Right-sided central venous catheter has been placed, tips overlie the SVC and proximal right atrium. Hypoventilatory changes. Suspected cardiomegaly. Moderate large right pleural effusion  with airspace disease at the right base. No pneumothorax IMPRESSION: 1. Moderate to large right pleural effusion with airspace disease at the right base. 2. Cardiomegaly. Electronically Signed   By: Luke Bun M.D.   On: 04/19/2024 22:51     Medications:    amiodarone      amiodarone      epinephrine  Stopped (2024/05/13 1121)    carvedilol   6.25 mg Oral BID WC   Chlorhexidine  Gluconate Cloth  6 each Topical Q0600   furosemide   40 mg Intravenous BID   hydrALAZINE   50 mg Oral Q8H   insulin  aspart  0-5 Units Subcutaneous QHS   insulin  aspart  0-6 Units Subcutaneous TID WC   pantoprazole   40 mg Oral Daily   acetaminophen  **OR** acetaminophen , polyethylene glycol, promethazine   Assessment/ Plan:  Deborah Dawson is a 74 y.o.  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 Patient presents to ED with vomiting, syncope and fall. She is admitted for Hyperkalemia [E87.5] Pleural effusion [J90] Fluid overload [E87.70] Hypervolemia, unspecified hypervolemia type [E87.70] Dyspnea, unspecified type [R06.00] Nausea and vomiting, unspecified vomiting type [R11.2]  [facility] [place]/MWF/right IJ PermCath  End-stage renal disease with hyperkalemia on hemodialysis.  Patient received yesterday, UF 1.7 L achieved.  Patient reports respiratory status has improved since treatment.  Next treatment scheduled for Friday.  2.  Generalized weakness, fall.CT abd pelvis negative. PT/OT eval  3. Anemia of chronic kidney disease Lab Results  Component Value Date   HGB 10.7 (L) May 13, 2024    Hemoglobin remains acceptable for evaluation.  4. Secondary Hyperparathyroidism: with outpatient labs: None Lab Results  Component Value Date   CALCIUM  8.8 (L) 05/13/24   PHOS 5.4 (H) 04/20/2024    Calcium  and phosphorus within optimal range for renal patient.   LOS:  1 Cletus Mehlhoff 2026/02/203:25 PM   "

## 2024-04-24 NOTE — ED Notes (Signed)
 Pt given apple juice at this time due to CBG of 66.

## 2024-04-24 NOTE — ED Notes (Signed)
 TOD 11:12am pronounced by Dr. Suzanne.

## 2024-04-24 NOTE — ED Notes (Signed)
 Pt demonstrated ability to turn in bed w/o assistance and to sit up in bed w/ assistance. Pt educated on importance of turning at different intervals to avoid skin breakdown. Pt was able to teach back information.

## 2024-04-24 NOTE — Progress Notes (Signed)
 " PROGRESS NOTE    ADDELYN Dawson  FMW:[id number] DOB: 15-Nov-1950 DOA: 04/19/2024 PCP: Sadie Manna, MD     Brief Narrative:   Deborah Dawson is a pleasant 74 y.o. female with medical history significant for ESRD on hemodialysis Monday Wednesday Friday, atrial fibrillation, cardiomyopathy/CHF, DM, gout, HTN who was recently admitted and discharged from [facility] between 02/25/2024 and 04/12/2024 after being treated for similar problems including fluid overload, pleural effusion, new PE, brought into [facility] ED for evaluation of a bilious vomiting began yesterday morning and she had a presyncopal episode leading to fall and hit head on the floor.  Patient had a last dialysis 04/16/2024.  Patient stated to me that she has been sick for the last couple months.  She was discharged from the hospital 3 days ago and yesterday she had yellowish vomiting.  She was feeling weak and she was about to pass out at home but she fell and hit her head.  She feels like she might have fluid buildup again.  She stated that she had thoracocentesis 8 times at [facility].  Otherwise, she denies fever, chills, nausea, vomiting, abdominal pain, dysuria, hematuria.  She is always short of breath for the last 2 months.    Assessment & Plan:   Principal Problem:   Fluid overload Active Problems:   Acute on chronic systolic CHF (congestive heart failure) (HCC)   Recurrent right pleural effusion   Acute respiratory failure with hypoxia (HCC)   Paroxysmal atrial fibrillation (HCC)   Hyponatremia   Hyperkalemia   Elevated troponin   History of pulmonary embolism   History of cardiac arrest   ESRD on dialysis (HCC)   DM (diabetes mellitus) (HCC)  # HFrEF End-stage CHF with recent EF of 15%. Evaluated at [facility] for lvad (not a candidate). NICM, prior CRT-P. Volume now managed mainly by dialysis - cardiology to see - cont home coreg , hydral - continue iv lasix  for now  # Recurrent pleural effusion 2/2 chf, esrd. Has required  multiple recent paracenteses - pulm following, planning on chest tube today  # ESRD Recent initiation of dialysis, has right chest permacath. On mwf dialysis, missed session recently. Hyperkalemic on admission, received dialysis yesterday - today's labs pending - nephrology following for dialysis  # Nausea/vomiting Appears resolved. CT abdomen nothing acute, lab w/u unremarkable - monitor - check blood cultures  # Acute hypoxic respiratory failure? On 4 liters but nothing but normal sats have been documented, have discontinued o2 and asked nursing to resume only if truly hypoxic  # History PE - home doac on hold  # A-fib Rate controlled - doac on hold - home coreg   # HTN Bp wnl - home hydral, coreg   # T2DM Hypoglycemic today - home insulin  on hold - SSI  # Goals of care Patient says she is aware her prognosis is poor, life expectancy is limited. However clearly states she wants only full code and full scope of care, will thus hold off on palliative consult.   DVT prophylaxis: SCDs Code Status: full Family Communication: none at bedside  Level of care: Progressive Status is: Inpatient Remains inpatient appropriate because: severity of illness    Consultants:  Cardiology Pulmonology nephrology  Procedures: Hemodialysis   Antimicrobials:  none    Subjective: Reports no pain, no vomiting today. Breathing improved from yesterday  Objective: Vitals:   05/08/2024 0600 2024-05-08 0606 2024/05/08 0700 05/08/24 0730  BP: (!) 129/106 (!) 129/106 (!) 128/93 (!) 135/91  Pulse: 99  97 (!) 101  Resp: 17  20 19   Temp: 97.6 F (36.4 C)  98 F (36.7 C)   TempSrc: Axillary  Oral   SpO2: 100%  100% 100%  Weight:      Height:        Intake/Output Summary (Last 24 hours) at April 22, 2024 0823 Last data filed at 04/20/2024 2319 Gross per 24 hour  Intake 11.49 ml  Output 1700 ml  Net -1688.51 ml   Filed Weights   04/19/24 2217 04/20/24 1608 04/20/24 1830  Weight:  56.2 kg 56.2 kg 54.5 kg    Examination:  General exam: Appears calm and comfortable  Respiratory system: normal wob, rales and decreased breath sounds at bases Cardiovascular system: distant heart sounds, rr Gastrointestinal system: Abdomen is nondistended, soft and nontender.   Central nervous system: Alert and oriented. No focal neurological deficits. Extremities: Symmetric 5 x 5 power. Trace LE edema Skin: hyperpigmentation b/l lower calfs Psychiatry: Judgement and insight appear normal. Mood & affect appropriate.     Data Reviewed: I have personally reviewed following labs and imaging studies  CBC: Recent Labs  Lab 04/19/24 2224 04/20/24 1625  WBC 8.3 7.2  HGB 11.0* 10.5*  HCT 33.6* 32.4*  MCV 91.8 90.0  PLT 227 228   Basic Metabolic Panel: Recent Labs  Lab 04/19/24 2224 04/20/24 1425  NA 132* 134*  K 5.7* 6.0*  CL 93* 96*  CO2 23 24  GLUCOSE 131* 43*  BUN 37* 43*  CREATININE 3.05* 3.41*  CALCIUM  9.3 9.2  PHOS  --  5.4*   GFR: Estimated Creatinine Clearance: 10.8 mL/min (A) (by C-G formula based on SCr of 3.41 mg/dL (H)). Liver Function Tests: Recent Labs  Lab 04/20/24 0019 04/20/24 1425  AST 33  --   ALT 30  --   ALKPHOS 79  --   BILITOT 0.7  --   PROT 7.1  --   ALBUMIN 3.6 3.5   Recent Labs  Lab 04/20/24 0019  LIPASE 35   No results for input(s): AMMONIA in the last 168 hours. Coagulation Profile: Recent Labs  Lab 04/19/24 2224 04/20/24 1326  INR 1.6* 1.5*   Cardiac Enzymes: No results for input(s): CKTOTAL, CKMB, CKMBINDEX, TROPONINI in the last 168 hours. BNP (last 3 results) Recent Labs    02/10/24 0213 02/13/24 0630 04/20/24 0019  PROBNP 15,914.0* >35,000.0* >35,000.0*   HbA1C: No results for input(s): HGBA1C in the last 72 hours. CBG: Recent Labs  Lab 04/20/24 2131 04/20/24 2318 04/22/24 0400 2024/04/22 0609 22-Apr-2024 0748  GLUCAP 69* 74 64* 105* 66*   Lipid Profile: No results for input(s): CHOL,  HDL, LDLCALC, TRIG, CHOLHDL, LDLDIRECT in the last 72 hours. Thyroid Function Tests: No results for input(s): TSH, T4TOTAL, FREET4, T3FREE, THYROIDAB in the last 72 hours. Anemia Panel: No results for input(s): VITAMINB12, FOLATE, FERRITIN, TIBC, IRON, RETICCTPCT in the last 72 hours. Urine analysis:    Component Value Date/Time   COLORURINE YELLOW (A) 02/10/2024 0149   APPEARANCEUR HAZY (A) 02/10/2024 0149   APPEARANCEUR Hazy (A) 04/19/2020 1411   LABSPEC 1.026 02/10/2024 0149   PHURINE 5.0 02/10/2024 0149   GLUCOSEU >=500 (A) 02/10/2024 0149   HGBUR SMALL (A) 02/10/2024 0149   BILIRUBINUR NEGATIVE 02/10/2024 0149   BILIRUBINUR Negative 04/19/2020 1411   KETONESUR NEGATIVE 02/10/2024 0149   PROTEINUR >=300 (A) 02/10/2024 0149   NITRITE NEGATIVE 02/10/2024 0149   LEUKOCYTESUR NEGATIVE 02/10/2024 0149   Sepsis Labs: @LABRCNTIP (procalcitonin:4,lacticidven:4)  )No results found for this or any  previous visit (from the past 240 hours).       Radiology Studies: ECHOCARDIOGRAM COMPLETE Result Date: 04/20/2024    ECHOCARDIOGRAM REPORT   Patient Name:   Deborah Dawson Anguiano Date of Exam: 04/20/2024 Medical Rec #:  [id number]    Height:       61.0 in Accession #:    [id number]   Weight:       124.0 lb Date of Birth:  November 10, 1950    BSA:          1.541 m Patient Age:    73 years     BP:           116/89 mmHg Patient Gender: F            HR:           102 bpm. Exam Location:  [facility] Procedure: 2D Echo (Both Spectral and Color Flow Doppler were utilized during            procedure). Indications:     Congestive Heart Failure  History:         Patient has prior history of Echocardiogram examinations. CHF,                  Angina and CAD, Pacemaker, Arrythmias:Atrial Fibrillation,                  Signs/Symptoms:Chest Pain and Hypertensive Heart Disease; Risk                  Factors:Hypertension.  Sonographer:     Rainelle Gull Referring Phys:  [id number] [facility] PAUDEL Diagnosing  Phys: Keller Paterson IMPRESSIONS  1. Left ventricular ejection fraction, by estimation, is <20%. Left ventricular ejection fraction by 2D MOD biplane is 10.6 %. The left ventricle has severely decreased function. The left ventricle demonstrates regional wall motion abnormalities (see scoring diagram/findings for description). The left ventricular internal cavity size was mildly dilated. There is mild left ventricular hypertrophy. Left ventricular diastolic parameters are consistent with Grade II diastolic dysfunction (pseudonormalization).  2. Right ventricular systolic function is moderately reduced. The right ventricular size is normal. There is moderately elevated pulmonary artery systolic pressure. The estimated right ventricular systolic pressure is 50.5 mmHg.  3. Left atrial size was moderately dilated.  4. Right atrial size was severely dilated.  5. Moderate pleural effusion.  6. The mitral valve is normal in structure. Mild to moderate mitral valve regurgitation.  7. Tricuspid valve regurgitation is moderate.  8. The aortic valve is tricuspid. Aortic valve regurgitation is not visualized.  9. The inferior vena cava is dilated in size with <50% respiratory variability, suggesting right atrial pressure of 15 mmHg. FINDINGS  Left Ventricle: Left ventricular ejection fraction, by estimation, is <20%. Left ventricular ejection fraction by 2D MOD biplane is 10.6 %. The left ventricle has severely decreased function. The left ventricle demonstrates regional wall motion abnormalities. The left ventricular internal cavity size was mildly dilated. There is mild left ventricular hypertrophy. Left ventricular diastolic parameters are consistent with Grade II diastolic dysfunction (pseudonormalization).  LV Wall Scoring: The mid and distal anterior septum, entire apex, basal inferolateral segment, and basal inferior segment are akinetic. The anterior wall, antero-lateral wall, basal anteroseptal segment, mid  inferolateral segment, mid inferoseptal segment, mid inferior segment, and basal inferoseptal segment are hypokinetic. Right Ventricle: The right ventricular size is normal. No increase in right ventricular wall thickness. Right ventricular systolic function is moderately reduced. There is moderately elevated pulmonary artery systolic pressure. The  tricuspid regurgitant velocity is 2.98 m/s, and with an assumed right atrial pressure of 15 mmHg, the estimated right ventricular systolic pressure is 50.5 mmHg. Left Atrium: Left atrial size was moderately dilated. Right Atrium: Device lead. Right atrial size was severely dilated. Pericardium: There is no evidence of pericardial effusion. Mitral Valve: The mitral valve is normal in structure. Mild to moderate mitral valve regurgitation. Tricuspid Valve: The tricuspid valve is normal in structure. Tricuspid valve regurgitation is moderate. Aortic Valve: The aortic valve is tricuspid. Aortic valve regurgitation is not visualized. Aortic valve mean gradient measures 1.0 mmHg. Aortic valve peak gradient measures 2.0 mmHg. Aortic valve area, by VTI measures 1.44 cm. Pulmonic Valve: The pulmonic valve was not well visualized. Pulmonic valve regurgitation is trivial. Aorta: The aortic root and ascending aorta are structurally normal, with no evidence of dilitation. Venous: The inferior vena cava is dilated in size with less than 50% respiratory variability, suggesting right atrial pressure of 15 mmHg. IAS/Shunts: The atrial septum is grossly normal. Additional Comments: A device lead is visualized. There is a moderate pleural effusion.  LEFT VENTRICLE PLAX 2D                        Biplane EF (MOD) LVIDd:         4.40 cm         LV Biplane EF:   Left LVIDs:         4.30 cm                          ventricular LV PW:         1.10 cm                          ejection LV IVS:        0.90 cm                          fraction by LVOT diam:     1.70 cm                          2D  MOD LV SV:         12                               biplane is LV SV Index:   8                                10.6 %. LVOT Area:     2.27 cm  LV Volumes (MOD) LV vol d, MOD    137.0 ml A2C: LV vol d, MOD    108.0 ml A4C: LV vol s, MOD    118.0 ml A2C: LV vol s, MOD    95.9 ml A4C: LV SV MOD A2C:   19.0 ml LV SV MOD A4C:   108.0 ml LV SV MOD BP:    13.3 ml RIGHT VENTRICLE RV S prime:     5.98 cm/s TAPSE (M-mode): 0.9 cm LEFT ATRIUM             Index LA diam:        4.90 cm 3.18 cm/m LA Vol (A2C):   57.4 ml  37.24 ml/m LA Vol (A4C):   64.7 ml 41.97 ml/m LA Biplane Vol: 63.5 ml 41.19 ml/m  AORTIC VALVE                    PULMONIC VALVE AV Area (Vmax):    1.28 cm     PV Vmax:          0.80 m/s AV Area (Vmean):   1.35 cm     PV Vmean:         49.500 cm/s AV Area (VTI):     1.44 cm     PV VTI:           0.120 m AV Vmax:           71.30 cm/s   PV Peak grad:     2.6 mmHg AV Vmean:          43.100 cm/s  PV Mean grad:     1.0 mmHg AV VTI:            0.085 m      PR End Diast Vel: 8.53 msec AV Peak Grad:      2.0 mmHg AV Mean Grad:      1.0 mmHg LVOT Vmax:         40.10 cm/s LVOT Vmean:        25.700 cm/s LVOT VTI:          0.054 m LVOT/AV VTI ratio: 0.64  AORTA Ao Root diam: 2.40 cm Ao Asc diam:  2.60 cm MR Peak grad: 88.0 mmHg   TRICUSPID VALVE MR Mean grad: 49.0 mmHg   TR Peak grad:   35.5 mmHg MR Vmax:      469.00 cm/s TR Vmax:        298.00 cm/s MR Vmean:     327.0 cm/s                           SHUNTS                           Systemic VTI:  0.05 m                           Systemic Diam: 1.70 cm Keller Paterson Electronically signed by Keller Paterson Signature Date/Time: 04/20/2024/4:59:44 PM    Final    CT ABDOMEN PELVIS WO CONTRAST Result Date: 04/20/2024 EXAM: CT ABDOMEN AND PELVIS WITHOUT CONTRAST 04/20/2024 12:57:06 AM TECHNIQUE: CT of the abdomen and pelvis was performed without the administration of intravenous contrast. Multiplanar reformatted images are provided for review. Automated exposure control,  iterative reconstruction, and/or weight-based adjustment of the mA/kV was utilized to reduce the radiation dose to as low as reasonably achievable. COMPARISON: 04/18/2020 CLINICAL HISTORY: Abdominal pain and bilious vomiting. FINDINGS: LOWER CHEST: Mild left basilar atelectasis is seen. Large right-sided pleural effusion is noted with right lower lobe consolidation. LIVER: The liver is unremarkable. GALLBLADDER AND BILE DUCTS: The gallbladder has been surgically removed. No biliary ductal dilatation. SPLEEN: No acute abnormality. PANCREAS: No acute abnormality. ADRENAL GLANDS: No acute abnormality. KIDNEYS, URETERS AND BLADDER: No stones in the kidneys or ureters. No hydronephrosis. No perinephric or periureteral stranding. The bladder is partially distended. Air is noted within the bladder, which may be related to recent instrumentation. . No findings to suggest fistulization are seen. GI AND BOWEL: Stomach demonstrates no acute abnormality. Small  bowel is within normal limits. No obstructive or inflammatory changes of the colon are seen. Scattered diverticular changes noted without diverticulitis. The appendix is within normal limits. There is no bowel obstruction. PERITONEUM AND RETROPERITONEUM: No free fluid is noted. No free air. VASCULATURE: Aortic calcifications are noted. LYMPH NODES: No lymphadenopathy. REPRODUCTIVE ORGANS: The uterus has been surgically removed. BONES AND SOFT TISSUES: No acute osseous abnormality. No focal soft tissue abnormality. IMPRESSION: 1. No evidence of bowel obstruction. 2. Large right pleural effusion with right lower lobe consolidation. Electronically signed by: Oneil Devonshire MD 04/20/2024 01:04 AM EST RP Workstation: [id number]   DG Chest 2 View Result Date: 04/19/2024 CLINICAL DATA:  Near syncope EXAM: CHEST - 2 VIEW COMPARISON:  02/23/2024 FINDINGS: Left-sided pacing device similar in position. Right-sided central venous catheter has been placed, tips overlie the SVC and  proximal right atrium. Hypoventilatory changes. Suspected cardiomegaly. Moderate large right pleural effusion with airspace disease at the right base. No pneumothorax IMPRESSION: 1. Moderate to large right pleural effusion with airspace disease at the right base. 2. Cardiomegaly. Electronically Signed   By: Luke Bun M.D.   On: 04/19/2024 22:51        Scheduled Meds:  carvedilol   6.25 mg Oral BID WC   Chlorhexidine  Gluconate Cloth  6 each Topical Q0600   furosemide   40 mg Intravenous BID   hydrALAZINE   50 mg Oral Q8H   pantoprazole   40 mg Oral Daily   Continuous Infusions:  heparin  550 Units/hr (05/11/24 0025)     LOS: 1 day     Devaughn KATHEE Ban, MD [facility]   If 7PM-7AM, please contact night-coverage [email or web address] Password [facility] 05/11/24, 8:23 AM     "

## 2024-04-24 NOTE — Progress Notes (Signed)
 SPIRITUAL CARE AND COUNSELING CONSULT NOTE   VISIT SUMMARY Chaplain responded to Code Blue/EOL page. Chaplain consulted with the staff. Chaplain remains available for follow-up spiritual and emotional support.   SPIRITUAL ENCOUNTER                                                                                                                                                                      Type of Visit: Initial Care provided to:: Family, Patient Conversation partners present during encounter: Physician, Nurse, Other (comment) (ed team) Referral source: Code page, Clinical staff Reason for visit: Patient death OnCall Visit: No   SPIRITUAL FRAMEWORK  Presenting Themes: Significant life change, Caregiving needs Community/Connection: Family, Significant other Patient Stress Factors: Major life changes Family Stress Factors: Major life changes, Exhausted, Family relationships   GOALS   Self/Personal Goals: healing Clinical Care Goals: healing   INTERVENTIONS   Spiritual Care Interventions Made: Supported grief process, Bereavement/grief support, Compassionate presence, Established relationship of care and support    INTERVENTION OUTCOMES   Outcomes: Awareness of support, Patient family open to resources  SPIRITUAL CARE PLAN   Spiritual Care Issues Still Outstanding: [name] will continue to follow    If immediate needs arise, please contact [facility] 24 hour on call [phone]   Barabara Chess, Chaplain  2024/05/14 11:48 AM

## 2024-04-24 NOTE — ED Notes (Signed)
 Faint pulse present. CPR stopped. DNR moving forward per Admission MD

## 2024-04-24 DEATH — deceased

## 2024-04-25 LAB — CULTURE, BLOOD (ROUTINE X 2): Special Requests: ADEQUATE

## 2024-04-26 LAB — CULTURE, BLOOD (ROUTINE X 2)
Culture: NO GROWTH
Special Requests: ADEQUATE
